# Patient Record
Sex: Female | Born: 1970 | Race: Black or African American | Hispanic: No | Smoking: Never smoker
Health system: Southern US, Community
[De-identification: ages and names within clinical notes are randomized; demographics above are authoritative.]

## PROBLEM LIST (undated history)

## (undated) DIAGNOSIS — M1990 Unspecified osteoarthritis, unspecified site: Secondary | ICD-10-CM

## (undated) DIAGNOSIS — M797 Fibromyalgia: Secondary | ICD-10-CM

## (undated) DIAGNOSIS — R059 Cough, unspecified: Secondary | ICD-10-CM

## (undated) DIAGNOSIS — R05 Cough: Secondary | ICD-10-CM

## (undated) DIAGNOSIS — E785 Hyperlipidemia, unspecified: Secondary | ICD-10-CM

## (undated) DIAGNOSIS — N3941 Urge incontinence: Secondary | ICD-10-CM

## (undated) DIAGNOSIS — F320 Major depressive disorder, single episode, mild: Secondary | ICD-10-CM

## (undated) DIAGNOSIS — R0600 Dyspnea, unspecified: Secondary | ICD-10-CM

## (undated) DIAGNOSIS — F32A Depression, unspecified: Secondary | ICD-10-CM

## (undated) DIAGNOSIS — J310 Chronic rhinitis: Secondary | ICD-10-CM

## (undated) DIAGNOSIS — E1165 Type 2 diabetes mellitus with hyperglycemia: Secondary | ICD-10-CM

## (undated) DIAGNOSIS — R51 Headache: Secondary | ICD-10-CM

## (undated) DIAGNOSIS — IMO0001 Reserved for inherently not codable concepts without codable children: Secondary | ICD-10-CM

## (undated) DIAGNOSIS — J849 Interstitial pulmonary disease, unspecified: Secondary | ICD-10-CM

## (undated) DIAGNOSIS — K219 Gastro-esophageal reflux disease without esophagitis: Secondary | ICD-10-CM

## (undated) DIAGNOSIS — J40 Bronchitis, not specified as acute or chronic: Secondary | ICD-10-CM

## (undated) DIAGNOSIS — I10 Essential (primary) hypertension: Secondary | ICD-10-CM

## (undated) HISTORY — DX: Dyspnea, unspecified: R06.00

## (undated) HISTORY — DX: Fibromyalgia: M79.7

## (undated) HISTORY — DX: Cough: R05

## (undated) HISTORY — DX: Hyperlipidemia, unspecified: E78.5

## (undated) HISTORY — DX: Depression, unspecified: F32.A

## (undated) HISTORY — DX: Type 2 diabetes mellitus with hyperglycemia: E11.65

## (undated) HISTORY — DX: Chronic rhinitis: J31.0

## (undated) HISTORY — DX: Bronchitis, not specified as acute or chronic: J40

## (undated) HISTORY — DX: Major depressive disorder, single episode, mild: F32.0

## (undated) HISTORY — DX: Interstitial pulmonary disease, unspecified: J84.9

## (undated) HISTORY — DX: Essential (primary) hypertension: I10

## (undated) HISTORY — DX: Morbid (severe) obesity due to excess calories: E66.01

## (undated) HISTORY — DX: Gastro-esophageal reflux disease without esophagitis: K21.9

## (undated) HISTORY — DX: Headache: R51

## (undated) HISTORY — DX: Cough, unspecified: R05.9

## (undated) HISTORY — PX: ABDOMINAL HYSTERECTOMY: SHX81

## (undated) HISTORY — PX: LUNG BIOPSY: SHX232

## (undated) HISTORY — DX: Urge incontinence: N39.41

## (undated) HISTORY — DX: Reserved for inherently not codable concepts without codable children: IMO0001

---

## 1998-01-06 ENCOUNTER — Encounter: Admission: RE | Admit: 1998-01-06 | Discharge: 1998-01-06 | Payer: Self-pay | Admitting: Family Medicine

## 1998-02-16 ENCOUNTER — Encounter: Admission: RE | Admit: 1998-02-16 | Discharge: 1998-02-16 | Payer: Self-pay | Admitting: Family Medicine

## 1998-03-07 ENCOUNTER — Encounter: Admission: RE | Admit: 1998-03-07 | Discharge: 1998-03-07 | Payer: Self-pay | Admitting: Family Medicine

## 1998-03-16 ENCOUNTER — Encounter: Admission: RE | Admit: 1998-03-16 | Discharge: 1998-03-16 | Payer: Self-pay | Admitting: Family Medicine

## 1998-12-02 ENCOUNTER — Other Ambulatory Visit: Admission: RE | Admit: 1998-12-02 | Discharge: 1998-12-02 | Payer: Self-pay | Admitting: *Deleted

## 1998-12-15 ENCOUNTER — Encounter: Payer: Self-pay | Admitting: *Deleted

## 1998-12-15 ENCOUNTER — Ambulatory Visit (HOSPITAL_COMMUNITY): Admission: RE | Admit: 1998-12-15 | Discharge: 1998-12-15 | Payer: Self-pay | Admitting: *Deleted

## 1999-02-06 ENCOUNTER — Inpatient Hospital Stay (HOSPITAL_COMMUNITY): Admission: RE | Admit: 1999-02-06 | Discharge: 1999-02-09 | Payer: Self-pay | Admitting: *Deleted

## 2000-08-01 ENCOUNTER — Emergency Department (HOSPITAL_COMMUNITY): Admission: EM | Admit: 2000-08-01 | Discharge: 2000-08-01 | Payer: Self-pay | Admitting: *Deleted

## 2000-12-10 ENCOUNTER — Emergency Department (HOSPITAL_COMMUNITY): Admission: EM | Admit: 2000-12-10 | Discharge: 2000-12-10 | Payer: Self-pay | Admitting: Emergency Medicine

## 2001-05-13 ENCOUNTER — Encounter: Payer: Self-pay | Admitting: Emergency Medicine

## 2001-05-13 ENCOUNTER — Emergency Department (HOSPITAL_COMMUNITY): Admission: EM | Admit: 2001-05-13 | Discharge: 2001-05-13 | Payer: Self-pay | Admitting: Emergency Medicine

## 2004-01-02 ENCOUNTER — Emergency Department (HOSPITAL_COMMUNITY): Admission: EM | Admit: 2004-01-02 | Discharge: 2004-01-02 | Payer: Self-pay | Admitting: Emergency Medicine

## 2004-01-10 ENCOUNTER — Emergency Department (HOSPITAL_COMMUNITY): Admission: EM | Admit: 2004-01-10 | Discharge: 2004-01-10 | Payer: Self-pay | Admitting: Emergency Medicine

## 2004-01-11 ENCOUNTER — Ambulatory Visit (HOSPITAL_COMMUNITY): Admission: RE | Admit: 2004-01-11 | Discharge: 2004-01-11 | Payer: Self-pay | Admitting: Internal Medicine

## 2004-01-17 ENCOUNTER — Ambulatory Visit: Admission: RE | Admit: 2004-01-17 | Discharge: 2004-01-17 | Payer: Self-pay | Admitting: Internal Medicine

## 2004-01-17 ENCOUNTER — Encounter (INDEPENDENT_AMBULATORY_CARE_PROVIDER_SITE_OTHER): Payer: Self-pay | Admitting: Specialist

## 2004-02-11 ENCOUNTER — Inpatient Hospital Stay (HOSPITAL_COMMUNITY): Admission: EM | Admit: 2004-02-11 | Discharge: 2004-02-20 | Payer: Self-pay | Admitting: Emergency Medicine

## 2004-02-15 ENCOUNTER — Encounter (INDEPENDENT_AMBULATORY_CARE_PROVIDER_SITE_OTHER): Payer: Self-pay | Admitting: *Deleted

## 2004-02-15 HISTORY — PX: BILATERAL VATS ABLATION: SHX1224

## 2004-02-17 ENCOUNTER — Encounter: Payer: Self-pay | Admitting: Internal Medicine

## 2004-03-02 ENCOUNTER — Encounter: Admission: RE | Admit: 2004-03-02 | Discharge: 2004-03-02 | Payer: Self-pay | Admitting: Thoracic Surgery

## 2004-03-07 ENCOUNTER — Encounter: Admission: RE | Admit: 2004-03-07 | Discharge: 2004-06-05 | Payer: Self-pay | Admitting: Internal Medicine

## 2004-03-13 ENCOUNTER — Emergency Department (HOSPITAL_COMMUNITY): Admission: EM | Admit: 2004-03-13 | Discharge: 2004-03-13 | Payer: Self-pay | Admitting: Emergency Medicine

## 2004-04-13 ENCOUNTER — Encounter: Admission: RE | Admit: 2004-04-13 | Discharge: 2004-04-13 | Payer: Self-pay | Admitting: Thoracic Surgery

## 2004-10-20 ENCOUNTER — Ambulatory Visit: Payer: Self-pay | Admitting: Internal Medicine

## 2004-11-15 ENCOUNTER — Ambulatory Visit: Payer: Self-pay | Admitting: Internal Medicine

## 2006-07-31 ENCOUNTER — Ambulatory Visit: Payer: Self-pay | Admitting: Internal Medicine

## 2007-01-30 ENCOUNTER — Other Ambulatory Visit: Admission: RE | Admit: 2007-01-30 | Discharge: 2007-01-30 | Payer: Self-pay | Admitting: Family Medicine

## 2007-06-19 ENCOUNTER — Ambulatory Visit: Payer: Self-pay | Admitting: Internal Medicine

## 2007-06-19 LAB — CONVERTED CEMR LAB
CO2: 28 meq/L (ref 19–32)
Chloride: 107 meq/L (ref 96–112)
Eosinophils Absolute: 0.1 10*3/uL (ref 0.0–0.6)
Eosinophils Relative: 0.7 % (ref 0.0–5.0)
GFR calc non Af Amer: 149 mL/min
Glucose, Bld: 85 mg/dL (ref 70–99)
HCT: 36.6 % (ref 36.0–46.0)
Lymphocytes Relative: 44.7 % (ref 12.0–46.0)
MCV: 89.2 fL (ref 78.0–100.0)
Neutro Abs: 3.4 10*3/uL (ref 1.4–7.7)
Neutrophils Relative %: 44.5 % (ref 43.0–77.0)
Pro B Natriuretic peptide (BNP): 14 pg/mL (ref 0.0–100.0)
RBC: 4.11 M/uL (ref 3.87–5.11)
TSH: 1.9 microintl units/mL (ref 0.35–5.50)
WBC: 7.7 10*3/uL (ref 4.5–10.5)

## 2007-06-26 ENCOUNTER — Ambulatory Visit: Payer: Self-pay | Admitting: Internal Medicine

## 2007-08-02 ENCOUNTER — Emergency Department (HOSPITAL_COMMUNITY): Admission: EM | Admit: 2007-08-02 | Discharge: 2007-08-02 | Payer: Self-pay | Admitting: Emergency Medicine

## 2007-10-10 ENCOUNTER — Ambulatory Visit: Payer: Self-pay | Admitting: Internal Medicine

## 2007-10-10 DIAGNOSIS — R059 Cough, unspecified: Secondary | ICD-10-CM | POA: Insufficient documentation

## 2007-10-10 DIAGNOSIS — K219 Gastro-esophageal reflux disease without esophagitis: Secondary | ICD-10-CM

## 2007-10-10 DIAGNOSIS — J069 Acute upper respiratory infection, unspecified: Secondary | ICD-10-CM | POA: Insufficient documentation

## 2007-10-10 DIAGNOSIS — R05 Cough: Secondary | ICD-10-CM

## 2007-10-10 DIAGNOSIS — J8410 Pulmonary fibrosis, unspecified: Secondary | ICD-10-CM

## 2007-10-22 ENCOUNTER — Telehealth (INDEPENDENT_AMBULATORY_CARE_PROVIDER_SITE_OTHER): Payer: Self-pay | Admitting: *Deleted

## 2007-10-23 ENCOUNTER — Ambulatory Visit: Payer: Self-pay | Admitting: Cardiology

## 2007-10-24 ENCOUNTER — Ambulatory Visit: Payer: Self-pay | Admitting: Internal Medicine

## 2007-12-30 ENCOUNTER — Ambulatory Visit: Payer: Self-pay | Admitting: Internal Medicine

## 2007-12-30 DIAGNOSIS — R0602 Shortness of breath: Secondary | ICD-10-CM

## 2008-02-24 ENCOUNTER — Telehealth (INDEPENDENT_AMBULATORY_CARE_PROVIDER_SITE_OTHER): Payer: Self-pay | Admitting: *Deleted

## 2008-02-24 ENCOUNTER — Ambulatory Visit: Payer: Self-pay | Admitting: Internal Medicine

## 2008-03-03 ENCOUNTER — Ambulatory Visit: Payer: Self-pay | Admitting: Internal Medicine

## 2008-03-17 ENCOUNTER — Ambulatory Visit: Payer: Self-pay | Admitting: Internal Medicine

## 2008-03-24 ENCOUNTER — Telehealth (INDEPENDENT_AMBULATORY_CARE_PROVIDER_SITE_OTHER): Payer: Self-pay | Admitting: *Deleted

## 2008-04-16 ENCOUNTER — Ambulatory Visit: Payer: Self-pay | Admitting: Internal Medicine

## 2008-05-28 ENCOUNTER — Ambulatory Visit: Payer: Self-pay | Admitting: Internal Medicine

## 2008-07-09 ENCOUNTER — Ambulatory Visit: Payer: Self-pay | Admitting: Internal Medicine

## 2008-09-10 ENCOUNTER — Telehealth: Payer: Self-pay | Admitting: Internal Medicine

## 2008-11-16 ENCOUNTER — Ambulatory Visit: Payer: Self-pay | Admitting: Internal Medicine

## 2008-12-23 ENCOUNTER — Ambulatory Visit: Payer: Self-pay | Admitting: Internal Medicine

## 2008-12-23 DIAGNOSIS — J310 Chronic rhinitis: Secondary | ICD-10-CM

## 2009-01-03 ENCOUNTER — Telehealth (INDEPENDENT_AMBULATORY_CARE_PROVIDER_SITE_OTHER): Payer: Self-pay | Admitting: *Deleted

## 2009-01-04 ENCOUNTER — Encounter (INDEPENDENT_AMBULATORY_CARE_PROVIDER_SITE_OTHER): Payer: Self-pay | Admitting: *Deleted

## 2009-02-17 ENCOUNTER — Ambulatory Visit: Payer: Self-pay | Admitting: Internal Medicine

## 2009-03-03 ENCOUNTER — Encounter: Payer: Self-pay | Admitting: Internal Medicine

## 2009-03-03 ENCOUNTER — Telehealth (INDEPENDENT_AMBULATORY_CARE_PROVIDER_SITE_OTHER): Payer: Self-pay | Admitting: *Deleted

## 2009-03-24 ENCOUNTER — Ambulatory Visit: Payer: Self-pay | Admitting: Internal Medicine

## 2009-03-24 DIAGNOSIS — N3941 Urge incontinence: Secondary | ICD-10-CM | POA: Insufficient documentation

## 2009-03-24 DIAGNOSIS — R51 Headache: Secondary | ICD-10-CM

## 2009-03-24 DIAGNOSIS — F329 Major depressive disorder, single episode, unspecified: Secondary | ICD-10-CM

## 2009-03-24 DIAGNOSIS — R519 Headache, unspecified: Secondary | ICD-10-CM | POA: Insufficient documentation

## 2009-03-24 LAB — CONVERTED CEMR LAB: Pap Smear: NORMAL

## 2009-04-05 ENCOUNTER — Telehealth (INDEPENDENT_AMBULATORY_CARE_PROVIDER_SITE_OTHER): Payer: Self-pay | Admitting: *Deleted

## 2009-04-20 ENCOUNTER — Ambulatory Visit: Payer: Self-pay | Admitting: Internal Medicine

## 2009-05-09 ENCOUNTER — Ambulatory Visit: Payer: Self-pay | Admitting: Internal Medicine

## 2009-05-13 ENCOUNTER — Ambulatory Visit: Payer: Self-pay | Admitting: Internal Medicine

## 2009-05-26 ENCOUNTER — Encounter: Admission: RE | Admit: 2009-05-26 | Discharge: 2009-05-26 | Payer: Self-pay | Admitting: Internal Medicine

## 2009-06-10 ENCOUNTER — Ambulatory Visit: Payer: Self-pay | Admitting: Internal Medicine

## 2009-06-20 ENCOUNTER — Encounter (HOSPITAL_COMMUNITY): Admission: RE | Admit: 2009-06-20 | Discharge: 2009-09-18 | Payer: Self-pay | Admitting: Internal Medicine

## 2009-06-27 ENCOUNTER — Telehealth: Payer: Self-pay | Admitting: Internal Medicine

## 2009-07-22 ENCOUNTER — Ambulatory Visit: Payer: Self-pay | Admitting: Internal Medicine

## 2009-08-10 ENCOUNTER — Ambulatory Visit: Payer: Self-pay | Admitting: Internal Medicine

## 2009-08-19 ENCOUNTER — Ambulatory Visit: Payer: Self-pay | Admitting: Pulmonary Disease

## 2009-08-19 ENCOUNTER — Telehealth (INDEPENDENT_AMBULATORY_CARE_PROVIDER_SITE_OTHER): Payer: Self-pay | Admitting: *Deleted

## 2009-08-22 ENCOUNTER — Ambulatory Visit: Payer: Self-pay | Admitting: Internal Medicine

## 2009-08-22 ENCOUNTER — Telehealth (INDEPENDENT_AMBULATORY_CARE_PROVIDER_SITE_OTHER): Payer: Self-pay | Admitting: *Deleted

## 2009-08-29 ENCOUNTER — Ambulatory Visit: Payer: Self-pay | Admitting: Internal Medicine

## 2009-09-07 ENCOUNTER — Ambulatory Visit: Payer: Self-pay | Admitting: Internal Medicine

## 2009-09-07 DIAGNOSIS — M62838 Other muscle spasm: Secondary | ICD-10-CM | POA: Insufficient documentation

## 2009-09-07 DIAGNOSIS — I10 Essential (primary) hypertension: Secondary | ICD-10-CM | POA: Insufficient documentation

## 2009-09-07 DIAGNOSIS — I152 Hypertension secondary to endocrine disorders: Secondary | ICD-10-CM | POA: Insufficient documentation

## 2009-09-07 LAB — CONVERTED CEMR LAB
Basophils Absolute: 0 10*3/uL (ref 0.0–0.1)
Eosinophils Absolute: 0 10*3/uL (ref 0.0–0.7)
GFR calc non Af Amer: 103.55 mL/min (ref >60)
Glucose, Bld: 110 mg/dL — ABNORMAL HIGH (ref 70–99)
HCT: 37.4 % (ref 36.0–46.0)
Hemoglobin: 12.4 g/dL (ref 12.0–15.0)
Lymphocytes Relative: 33.6 % (ref 12.0–46.0)
Lymphs Abs: 2.4 10*3/uL (ref 0.7–4.0)
Monocytes Absolute: 0.6 10*3/uL (ref 0.1–1.0)
Monocytes Relative: 8.3 % (ref 3.0–12.0)
Potassium: 4.4 meq/L (ref 3.5–5.1)
Sodium: 144 meq/L (ref 135–145)
TSH: 2.26 microintl units/mL (ref 0.35–5.50)
WBC: 7 10*3/uL (ref 4.5–10.5)

## 2009-09-19 ENCOUNTER — Encounter (HOSPITAL_COMMUNITY): Admission: RE | Admit: 2009-09-19 | Discharge: 2009-09-30 | Payer: Self-pay | Admitting: Internal Medicine

## 2009-09-21 ENCOUNTER — Ambulatory Visit: Payer: Self-pay | Admitting: Internal Medicine

## 2009-09-27 ENCOUNTER — Ambulatory Visit: Payer: Self-pay | Admitting: Internal Medicine

## 2009-10-01 ENCOUNTER — Encounter (HOSPITAL_COMMUNITY): Admission: RE | Admit: 2009-10-01 | Discharge: 2009-10-31 | Payer: Self-pay | Admitting: Internal Medicine

## 2009-11-08 ENCOUNTER — Ambulatory Visit: Payer: Self-pay | Admitting: Internal Medicine

## 2009-12-20 ENCOUNTER — Ambulatory Visit: Payer: Self-pay | Admitting: Internal Medicine

## 2010-01-05 ENCOUNTER — Ambulatory Visit: Payer: Self-pay | Admitting: Internal Medicine

## 2010-01-05 DIAGNOSIS — H1045 Other chronic allergic conjunctivitis: Secondary | ICD-10-CM

## 2010-01-05 DIAGNOSIS — J309 Allergic rhinitis, unspecified: Secondary | ICD-10-CM

## 2010-06-08 ENCOUNTER — Encounter: Payer: Self-pay | Admitting: Internal Medicine

## 2010-06-22 ENCOUNTER — Ambulatory Visit: Payer: Self-pay | Admitting: Internal Medicine

## 2010-06-28 ENCOUNTER — Telehealth: Payer: Self-pay | Admitting: Internal Medicine

## 2010-08-11 ENCOUNTER — Ambulatory Visit: Payer: Self-pay | Admitting: Internal Medicine

## 2010-10-31 ENCOUNTER — Other Ambulatory Visit: Payer: Self-pay | Admitting: Internal Medicine

## 2010-10-31 ENCOUNTER — Ambulatory Visit
Admission: RE | Admit: 2010-10-31 | Discharge: 2010-10-31 | Payer: Self-pay | Source: Home / Self Care | Attending: Internal Medicine | Admitting: Internal Medicine

## 2010-10-31 LAB — HEPATIC FUNCTION PANEL
ALT: 23 U/L (ref 0–35)
AST: 18 U/L (ref 0–37)
Bilirubin, Direct: 0.1 mg/dL (ref 0.0–0.3)
Total Bilirubin: 0.5 mg/dL (ref 0.3–1.2)

## 2010-10-31 LAB — CBC WITH DIFFERENTIAL/PLATELET
Basophils Absolute: 0 10*3/uL (ref 0.0–0.1)
Eosinophils Relative: 0.2 % (ref 0.0–5.0)
HCT: 38.6 % (ref 36.0–46.0)
Hemoglobin: 12.9 g/dL (ref 12.0–15.0)
Lymphs Abs: 2.1 10*3/uL (ref 0.7–4.0)
Monocytes Absolute: 0.5 10*3/uL (ref 0.1–1.0)
Monocytes Relative: 7.9 % (ref 3.0–12.0)
Neutrophils Relative %: 59.2 % (ref 43.0–77.0)
Platelets: 218 10*3/uL (ref 150.0–400.0)
RDW: 14.9 % — ABNORMAL HIGH (ref 11.5–14.6)
WBC: 6.4 10*3/uL (ref 4.5–10.5)

## 2010-10-31 LAB — URINALYSIS
Hemoglobin, Urine: NEGATIVE
Leukocytes, UA: NEGATIVE
Nitrite: NEGATIVE
Urobilinogen, UA: 0.2 (ref 0.0–1.0)

## 2010-10-31 LAB — LIPID PANEL
LDL Cholesterol: 135 mg/dL — ABNORMAL HIGH (ref 0–99)
Total CHOL/HDL Ratio: 4
Triglycerides: 40 mg/dL (ref 0.0–149.0)

## 2010-10-31 LAB — BASIC METABOLIC PANEL
CO2: 29 mEq/L (ref 19–32)
GFR: 173.02 mL/min (ref >60.00)
Glucose, Bld: 91 mg/dL (ref 70–99)
Potassium: 4.7 mEq/L (ref 3.5–5.1)
Sodium: 140 mEq/L (ref 135–145)

## 2010-10-31 NOTE — Assessment & Plan Note (Signed)
Summary: Pulmonary/ ext ov with walking sats = dropped p 185 x 3   Primary Provider/Referring Provider:  Newt Lukes MD  CC:  Followup.  Pt states that she has been doing well. He has not had any trouble with cough or with SOB.  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  Since onset waxing/waning sx requiring intermittent prednisone tapers with only minimal improvement - most of her symptoms chronically have been related to obesity with dyspnea on exertion and tendency to chronic cough which is felt to be at least partly  reflux related.     10/9/9  ov  maintained on 10 mg/day without flare of cough or sob.   Feb 17, 2009 better until 2 weeks ago when tapered prednisone to   10-5 -10 >  more sob, eyes running and swollen sneezing not responding flonase or clariton  rec 10 mg 2 until better then 2-1-2 and flared with cough on that dose so resumed 2 daily but still cough with deep breath. rec restart 20/day  June 10, 2009 on 20 mg no cough, worried about the ragweed season minimal symptoms. using med calendar well. rec  try 20 a/w 10  07/22/09 doing fine so try floor 10 mg per day, ceiling of 20mg  per day.  August 22, 2009 Followup per Dr Shelle Iron.  Pt was seen here by [facility] on 11/19  for cough rx prednisone burst and add back reglan but just at hs..  Pt states that her cough is worse.  Cough is prod with yellow sputum.  Pt states that her voice "comes and goes".  Breathing is better when she is not coughing. She is taking 40 mg of prednisone daily.  --Tx w/ Levaquin, pred burst, reglan and pepcid added.   September 27, 2009 ov all better on prednisone 10mg  and back to baseline ex tolerance, cough control.   November 08, 2009 ov loosing wt voluntarily, no cough, baseline doe on 02 - rec taper reglan, keep up with med calendar  see page 2 June 22, 2010  Followup.  Pt states that she has been doing well. He has not had any trouble with cough or with SOB.  She does c/o some sinus pressure/drainage x 3 days. not using med calendar or amlodipine or advair. Not using 02 with ex, not able to loose wt. Pt denies any significant sore throat, dysphagia, itching, sneezing,  nasal congestion or excess secretions,  fever, chills, sweats, unintended wt loss, pleuritic or exertional cp, hempoptysis, change in activity tolerance  orthopnea pnd or leg swelling   Current Medications (verified): 1)  Reglan 10 Mg  Tabs (Metoclopramide Hcl) .Marland Kitchen.. 1 Tab By Mouth Before Every Meal and At Bedtime 2)  Nexium 40 Mg  Pack (Esomeprazole Magnesium) .... Take One 30-60 Min Before First and Last Meals of The Day 3)  Fluticasone Propionate 50 Mcg/act Susp (Fluticasone Propionate) .... 2 Sprays in Each Nostril Twice Daily  Have Not Used This- Only Uses As Needed 4)  Detrol La 2 Mg Xr24h-Cap (Tolterodine Tartrate) .Marland Kitchen.. 1 By Mouth Once Daily 5)  B-12 1000 Mcg Subl (Cyanocobalamin) .Marland Kitchen.. 1 Once Daily 6)  Oxygen .... 2 Liters Per Minutes At Bedtime 7)  Pepcid Ac Maximum Strength 20 Mg Tabs (Famotidine) .... One At Bedtime 8)  Mucinex Dm 30-600 Mg Xr12h-Tab (Dextromethorphan-Guaifenesin) .... Take  1-2 Tablets Every 12 Hours As Needed 9)  Acetaminophen-Codeine #4 300-60 Mg Tabs (Acetaminophen-Codeine) .Marland Kitchen.. 1-2 Every 4-6 Hours As Needed 10)  Prednisone 10 Mg Tabs (Prednisone) .... Take 1 By Mouth Qd 11)  Proair Hfa 108 (90 Base) Mcg/act Aers (Albuterol Sulfate) .... Inhale 2 Puffs Every 4-6 Hours As Needed 12)  Cetirizine Hcl 10 Mg Tabs (Cetirizine Hcl) .Marland Kitchen.. 1po Once Daily As Needed Allergies 13)  Oxygen .... Wear 2l/m With Activity 14)  Amlodipine Besylate 5 Mg Tabs (Amlodipine Besylate) .Marland Kitchen.. 1 By Mouth Once Daily 15)  Advair Diskus 250-50 Mcg/dose Aepb (Fluticasone-Salmeterol) .Marland Kitchen.. 1 Puff Two Times A Day  Not Taking This Now  Allergies (verified): 1)  ! Penicillin  Past  History:  Past Medical History: GASTROESOPHAGEAL REFLUX DISEASE (ICD-530.81)     - Taper reglan to 10 mg one half twice daily June 22, 2010  INTERSTITIAL LUNG DISEASE (ICD-515)    -VATS 02/15/2004 NSIP Adrienne Mocha)   -Restart Prednisone 02/24/08   -PFT's December 23, 2008 VC 35%  DLC0 31% MORBID OBESITY (ICD-278.01)   - Target wt  =  153  for BMI < 30  Health Maintenance..........................................................Marland KitchenMarland KitchenFelicity Coyer    - Pneumovax July 22, 2009     -Complex med review August 29, 2009    Vital Signs:  Patient profile:   40 year old female Weight:      299 pounds O2 Sat:      100 % on Room air Temp:     98.1 degrees F oral Pulse rate:   107 / minute BP sitting:   120 / 80  (left arm) Cuff size:   large  Vitals Entered By: Vernie Murders (June 22, 2010 4:14 PM)  O2 Flow:  Room air  Serial Vital Signs/Assessments:  Comments: Ambulatory Pulse Oximetry  Resting; HR__103___    02 Sat__98%RA___  Lap1 (185 feet)   HR__136___   02 Sat__94%RA___ Lap2 (185 feet)   HR__134___   02 Sat___82%RA__    Lap3 (185 feet)   HR_____   02 Sat_____  ___Test Completed without Difficulty _x__Test Stopped due to: pt desat to 82%RA. Zackery Barefoot CMA  June 22, 2010 4:44 PM  By: Zackery Barefoot CMA    Physical Exam  Additional Exam:  Massively obese pleasant amb bf nad  wt  305 May 28, 2008 > 305 July 09, 2008 > 305 November 16, 2008    > 301 November 08, 2009  > 299 June 22, 2010  Afeb with normal vital signs HEENT: nl dentition, turbinates, and orophanx. Nl external ear canals without cough reflex Neck without JVD/Nodes/TM Lungs distant bs, minimal basilar insp crackles bilaterally not worse with deep breath, not coughing now with deep breath RRR no s3 or murmur or increase in P2. no edema Abd soft and benign with nl excursion in the supine position. Ext warm without calf tenderness, cyanosis clubbing     Impression &  Recommendations:  Problem # 1:  INTERSTITIAL LUNG DISEASE (ICD-515)  The goal with a chronic steroid dependent illness is always arriving at the lowest effective dose that controls the disease/symptoms and not accepting a set "formula" which is based on statistics that don't take into accound individual variability or the natural hx of the dz in every individual patient, which may well vary over time. for now rec floor is 10 mg per day  alternate with 5 mg if tolerates       Problem # 2:  GASTROESOPHAGEAL REFLUX DISEASE (ICD-530.81)  Her updated  medication list for this problem includes:    Nexium 40 Mg Pack (Esomeprazole magnesium) .Marland Kitchen... Take one 30-60 min before first and last meals of the day    Pepcid Ac Maximum Strength 20 Mg Tabs (Famotidine) ..... One at bedtime   Will try to taper reglan now  Orders: Est. Patient Level IV (11914)  Problem # 3:  MORBID OBESITY (ICD-278.01)  wt loss would go better if used 02 with ex as rec previously  Orders: Est. Patient Level IV (78295)  Medications Added to Medication List This Visit: 1)  Prednisone 10 Mg Tabs (Prednisone) .... One on even and half on odd 2)  Reglan 10 Mg Tabs (Metoclopramide hcl) .... One half before bfast and bedtime 3)  Fluticasone Propionate 50 Mcg/act Susp (Fluticasone propionate) .... 2 sprays in each nostril twice daily  have not used this- only uses as needed 4)  Advair Diskus 250-50 Mcg/dose Aepb (Fluticasone-salmeterol) .Marland Kitchen.. 1 puff two times a day  not taking this now  Other Orders: Pulse Oximetry, Ambulatory (62130)  Patient Instructions: 1)  ok to leave off advair, amlodipine 2)  Try reduce reglan to 10 mg one half before breakfast and bedtime 3)  Reduce Prednisone to 10 mg one on even half on odd 4)  See Tammy NP w/in 6 weeks with all your medications, even over the counter meds, separated in two separate bags, the ones you take no matter what vs the ones you stop once you feel better and take only as  needed.  She will generate for you a new user friendly medication calendar that will put [place] all on the same page re: your medication use.  5)  Late add wt loss would go better if used 02 with ex as rec previously  Appended Document: Pulmonary/ ext ov with walking sats = dropped p 185 x 3 see late add re 02 instructions  Appended Document: Pulmonary/ ext ov with walking sats = dropped p 185 x 3 [id number]  Appended Document: Pulmonary/ ext ov with walking sats = dropped p 185 x 3 [id number]

## 2010-10-31 NOTE — Assessment & Plan Note (Signed)
Summary: Pulmonary/ f/u NSIP   Primary Provider/Referring Provider:  Newt Lukes MD  CC: 39 wk followup.  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  Since onset waxing/waning sx requiring intermittent prednisone tapers with only minimal improvement - most of her symptoms chronically have been related to obesity with dyspnea on exertion and tendency to chronic cough which is felt to be at least partly  reflux related.     39/9/9  ov  maintained on 10 mg/day without flare of cough or sob.   Feb 17, 2009 better until 2 weeks ago when tapered prednisone to   10-5 -10 >  more sob, eyes running and swollen sneezing not responding flonase or clariton  rec 10 mg 2 until better then 2-1-2 and flared with cough on that dose so resumed 2 daily but still cough with deep breath. rec restart 20/day  June 10, 2009 on 20 mg no cough, worried about the ragweed season minimal symptoms. using med calendar well. rec  try 20 a/w 10  07/22/09 doing fine so try floor 10 mg per day, ceiling of 20mg  per day.  August 22, 2009 Followup per Dr Shelle Iron.  Pt was seen here by [facility] on 11/19  for cough rx prednisone burst and add back reglan but just at hs..  Pt states that her cough is worse.  Cough is prod with yellow sputum.  Pt states that her voice "comes and goes".  Breathing is better when she is not coughing. She is taking 40 mg of prednisone daily.  --Tx w/ Levaquin, pred burst, reglan and pepcid added.   September 27, 2009 ov all better on prednisone 10mg  and back to baseline ex tolerance, cough control.   November 08, 2009 ov loosing wt voluntarily, no cough, baseline doe on 02  Pt denies any significant sore throat, dysphagia, itching, sneezing,  nasal congestion or excess secretions,  fever, chills, sweats, unintended wt loss, pleuritic or exertional cp,  hempoptysis, change in activity tolerance  orthopnea pnd or leg swelling   Current Medications (verified): 1)  Reglan 10 Mg  Tabs (Metoclopramide Hcl) .Marland Kitchen.. 1 Tab By Mouth Before Every Meal and At Bedtime 2)  Nexium 40 Mg  Pack (Esomeprazole Magnesium) .... Take One 30-60 Min Before First and Last Meals of The Day 3)  Fluticasone Propionate 50 Mcg/act Susp (Fluticasone Propionate) .... 2 Sprays in Each Nostril Twice Daily 4)  Detrol La 2 Mg Xr24h-Cap (Tolterodine Tartrate) .Marland Kitchen.. 1 By Mouth Once Daily 5)  B-12 1000 Mcg Subl (Cyanocobalamin) .Marland Kitchen.. 1 Once Daily 6)  Oxygen .... 2 Liters Per Minutes At Bedtime 7)  Pepcid Ac Maximum Strength 20 Mg Tabs (Famotidine) .... One At Bedtime 8)  Mucinex Dm 30-600 Mg Xr12h-Tab (Dextromethorphan-Guaifenesin) .... Take 1-2 Tablets Every 12 Hours As Needed 9)  Acetaminophen-Codeine #4 300-60 Mg Tabs (Acetaminophen-Codeine) .Marland Kitchen.. 1-2 Every 4-6 Hours As Needed 10)  Prednisone 10 Mg Tabs (Prednisone) .... Take 1 By Mouth Qd 11)  Proair Hfa 108 (90 Base) Mcg/act Aers (Albuterol Sulfate) .... Inhale 2 Puffs Every 4-6 Hours As Needed 12)  Loratadine 10 Mg Tabs (Loratadine) .... Take 1 Tablet By Mouth Once A Day As Needed 13)  Oxygen .... Wear 2l/m With Activity 14)  Amlodipine Besylate 5 Mg Tabs (Amlodipine Besylate) .Marland Kitchen.. 1 By Mouth Once Daily  Allergies (verified): 1)  ! Penicillin  Past History:  Past Medical History: GASTROESOPHAGEAL REFLUX DISEASE (ICD-530.81) INTERSTITIAL LUNG DISEASE (ICD-515)    -VATS 02/15/2004 NSIP ([place]zenstein)   -Restart Prednisone 02/24/08   -PFT's December 23, 2008 VC 35%  DLC0 31% MORBID OBESITY (ICD-278.01)   - Target wt  =  153  for BMI < 30  Health Maintenance......................................................Marland KitchenMarland KitchenFelicity Coyer    - Pneumovax July 22, 2009     -Complex med review August 29, 2009    Vital Signs:  Patient profile:   40 year old female Weight:      301 pounds O2 Sat:      99 % on 2 L/min Temp:     97.7 degrees  F oral Pulse rate:   86 / minute BP sitting:   122 / 70  (left arm) Cuff size:   large  Vitals Entered By: Vernie Murders (November 08, 2009 9:53 AM)  O2 Flow:  2 L/min  Serial Vital Signs/Assessments:  Comments: 10:54 AM Ambulatory Pulse Oximetry  Resting; HR__101___    02 Sat__99% 2lpm___  Lap1 (185 feet)   HR__142___   02 Sat__91%2lpm___ Lap2 (185 feet)   HR_____   02 Sat_____    Lap3 (185 feet)   HR_____   02 Sat_____  ___Test Completed without Difficulty ___Test Stopped due to:   By: Vernie Murders    Physical Exam  Additional Exam:  Massively obese pleasant amb bf nad  wt  305 May 28, 2008 > 305 July 09, 2008 > 305 November 16, 2008 >   300 May 17, 2009 > 304 July 22, 2009 > 299 August 22, 2009 >>308 August 29, 2009 > 309 September 27, 2009 > 301 November 08, 2009  Afeb with normal vital signs HEENT: nl dentition, turbinates, and orophanx. Nl external ear canals without cough reflex Neck without JVD/Nodes/TM Lungs distant bs, minimal basilar insp crackles bilaterally not worse with deep breath  RRR no s3 or murmur or increase in P2. no edema Abd soft and benign with nl excursion in the supine position. Ext warm without calf tenderness, cyanosis clubbing     Impression & Recommendations:  Problem # 1:  INTERSTITIAL LUNG DISEASE (ICD-515) The goal with a chronic steroid dependent illness is always arriving at the lowest effective dose that controls the disease/symptoms and not accepting a set "formula" which is based on statistics that don't take into accound individual variability or the natural hx of the dz in every individual patient, which may well vary over time. for now floor is 10 mg per day and next step is try to wean reglan down if not off which largely will depend on her ability to follow diet and continue to loose wt.   Each maintenance medication was reviewed in detail including most importantly the difference between maintenance and as  needed and under what circumstances the prns are to be used. This was done in the context of a medication calendar review which provided the patient with a user-friendly unambiguous mechanism for medication administration and reconciliation and provides an action plan for all active problems. It is critical that this be shown to every doctor  for modification during the office visit if necessary so the patient can use it as a working document.   Problem # 2:  DYSPNEA (ICD-786.05) 02 supplement appears adequate @ present level of activity  Problem # 3:  COUGH (ICD-786.2) Appears to have major component of Upper airway cough syndrome, so named because  it's frequently impossible to sort out how much is LPR vs CR/sinusitis with freq throat clearing generating secondary extra esophageal GERD from wide swings in gastric pressure that occur with throat clearing, promoting self use of mint and menthol lozenges that reduce the lower esophageal sphincter tone and exacerbate the problem further.  These symptoms are easily confused with asthma/copd by even experienced pulmonogists because they overlap so much. These are the same pts who not infrequently have failed to tolerate ace inhibitors,  dry powder inhalers or biphosphonates or report having reflux symptoms that don't respond to standard doses of PPI   much better so try to reduce GERD rx and see if it flares  Other Orders: Est. Patient Level III (81829) Pulse Oximetry, Ambulatory (93716)  Patient Instructions: 1)  Try reduce reglan to one half before meals and one at bedtime 2)  See calendar for specific medication instructions and bring it back for each and every office visit for every healthcare provider you see.  Without it,  you may not receive the best quality medical care that we feel you deserve.  3)  Please schedule a follow-up appointment in 6 weeks, sooner if needed

## 2010-10-31 NOTE — Letter (Signed)
Summary: [facility]  [address]   [place], [place] [address]   Phone: [phone]  Fax: [phone]    06/08/2010  [facility] [address] [place], [place]  [address]  Dear Ms. Laster,   According to our records you are due for a followup with Dr. Sherene Sires.  Please call our office at [phone] as soon as possible to schedule appointment. Thank You        Sincerely,   [facility]

## 2010-10-31 NOTE — Assessment & Plan Note (Signed)
Summary: NP follow up - med calendar   Primary Provider/Referring Provider:  Newt Lukes MD  CC:  est med calendar - pt brought all meds with her today.  c/o increased SOB, wheezing, dry cough x2weeks, would also like to discuss increased acid indegestion and states that food "has a hard time going down, and like it's stuck.".  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  Since onset waxing/waning sx requiring intermittent prednisone tapers with only minimal improvement - most of her symptoms chronically have been related to obesity with dyspnea on exertion and tendency to chronic cough which is felt to be at least partly  reflux related.     10/9/9  ov  maintained on 10 mg/day without flare of cough or sob.   Feb 17, 2009 better until 2 weeks ago when tapered prednisone to   10-5 -10 >  more sob, eyes running and swollen sneezing not responding flonase or clariton  rec 10 mg 2 until better then 2-1-2 and flared with cough on that dose so resumed 2 daily but still cough with deep breath. rec restart 20/day  June 10, 2009 on 20 mg no cough, worried about the ragweed season minimal symptoms. using med calendar well. rec  try 20 a/w 10  07/22/09 doing fine so try floor 10 mg per day, ceiling of 20mg  per day.  August 22, 2009 Followup per Dr Shelle Iron.  Pt was seen here by [facility] on 11/19  for cough rx prednisone burst and add back reglan but just at hs..  Pt states that her cough is worse.  Cough is prod with yellow sputum.  Pt states that her voice "comes and goes".  Breathing is better when she is not coughing. She is taking 40 mg of prednisone daily.  --Tx w/ Levaquin, pred burst, reglan and pepcid added.   September 27, 2009 ov all better on prednisone 10mg  and back to baseline ex tolerance, cough control.   November 08, 2009 ov loosing wt voluntarily, no cough, baseline doe on 02 - rec taper reglan, keep up with  med calendar  see page 2 June 22, 2010 Followup.  Pt states that she has been doing well. He has not had any trouble with cough or with SOB.  She does c/o some sinus pressure/drainage x 3 days. not using med calendar or amlodipine or advair. Not using 02 with ex, not able to loose wt.  >>decrease pred 10 and 5    August 11, 2010 --Presents for follow up and med review. We reviewed her meds and updated her med calendar. Feels over last week her breathing not quite as good. She  has increased her prednisone to 10mg  once daily. Was not able to decrease steroids to 1/2 every other day. no discolred mucus, fever. Denies chest pain, dyspnea, orthopnea, hemoptysis, fever, n/v/d, edema, headache. Requests refills today. Under some stress. Father recently passed. Strained relationship with mother.   Medications Prior to Update: 1)  Reglan 10 Mg  Tabs (Metoclopramide Hcl) .... One Half Before Bfast and Bedtime 2)  Nexium 40 Mg  Pack (Esomeprazole Magnesium) .... Take One 30-60 Min Before First and Last Meals of The Day 3)  Fluticasone Propionate 50 Mcg/act Susp (Fluticasone Propionate) .... 2 Sprays in Each Nostril Twice Daily  Have Not Used This- Only Uses As Needed 4)  Detrol La 2 Mg Xr24h-Cap (Tolterodine Tartrate) .Marland KitchenMarland KitchenMarland Kitchen  1 By Mouth Once Daily 5)  Prednisone 10 Mg Tabs (Prednisone) .... One On Even and Half On Odd 6)  Pepcid Ac Maximum Strength 20 Mg Tabs (Famotidine) .... One At Bedtime 7)  Oxygen .... 2 Liters Per Minutes At Bedtime 8)  Mucinex Dm 30-600 Mg Xr12h-Tab (Dextromethorphan-Guaifenesin) .... Take 1-2 Tablets Every 12 Hours As Needed 9)  Acetaminophen-Codeine #4 300-60 Mg Tabs (Acetaminophen-Codeine) .Marland Kitchen.. 1-2 Every 4-6 Hours As Needed 10)  Cetirizine Hcl 10 Mg Tabs (Cetirizine Hcl) .Marland Kitchen.. 1po Once Daily As Needed Allergies 11)  Oxygen .... Wear 2l/m With Activity 12)  Proair Hfa 108 (90 Base) Mcg/act Aers (Albuterol Sulfate) .... Inhale 2 Puffs Every 4-6 Hours As Needed 13)  B-12 1000 Mcg  Subl (Cyanocobalamin) .Marland Kitchen.. 1 Once Daily  Current Medications (verified): 1)  Reglan 10 Mg  Tabs (Metoclopramide Hcl) .... 1/2 Tab By Mouth  Every Morning and At Bedtime 2)  Nexium 40 Mg Cpdr (Esomeprazole Magnesium) .... Take 1 Capsule By Mouth Before The First and Last Meals of The Day 3)  Fluticasone Propionate 50 Mcg/act Susp (Fluticasone Propionate) .... 2 Sprays Each Nostril in The Morning and At Bedtime 4)  Detrol La 2 Mg Xr24h-Cap (Tolterodine Tartrate) .Marland Kitchen.. 1 By Mouth Once Daily 5)  Prednisone 10 Mg Tabs (Prednisone) .... Take 1 Tablet By Mouth Once A Day 6)  Pepcid Ac Maximum Strength 20 Mg Tabs (Famotidine) .... One At Bedtime 7)  Oxygen 2l/min .... Wear At Bedtime 8)  Mucinex Dm 30-600 Mg Xr12h-Tab (Dextromethorphan-Guaifenesin) .... Take 1 Tablet Every 12 Hours As Needed 9)  Acetaminophen-Codeine #4 300-60 Mg Tabs (Acetaminophen-Codeine) .Marland Kitchen.. 1-2 Every 4-6 Hours As Needed 10)  Loratadine 10 Mg Tabs (Loratadine) .... Take 1 Tablet By Mouth Once A Day As Needed 11)  Oxygen .... Wear 2l/m With Activity 12)  Proair Hfa 108 (90 Base) Mcg/act Aers (Albuterol Sulfate) .... Inhale 2 Puffs Every 4-6 Hours As Needed  Allergies (verified): 1)  ! Penicillin  Past History:  Past Medical History: Last updated: 06/22/2010 GASTROESOPHAGEAL REFLUX DISEASE (ICD-530.81)     - Taper reglan to 10 mg one half twice daily June 22, 2010  INTERSTITIAL LUNG DISEASE (ICD-515)    -VATS 02/15/2004 NSIP Adrienne Mocha)   -Restart Prednisone 02/24/08   -PFT's December 23, 2008 VC 35%  DLC0 31% MORBID OBESITY (ICD-278.01)   - Target wt  =  153  for BMI < 30  Health Maintenance..........................................................Marland KitchenMarland KitchenFelicity Coyer    - Pneumovax July 22, 2009     -Complex med review August 29, 2009    Family History: Last updated: 03/17/2008 positive sarcoid and her mother diagnosed in this clinic by transbronchial bx positive heart disease in her father and  diabetes family history of allergies in both parents both grandmothers had cancer of unknown type patient has 1 sibling with no significant medical history  Social History: Last updated: 04/20/2009 Patient never smoked.  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  Risk Factors: Smoking Status: never (10/10/2007)  Review of Systems      See HPI  Vital Signs:  Patient profile:   40 year old female Height:      61 inches Weight:      300.25 pounds BMI:     56.94 O2 Sat:      100 % on Room air Temp:     98.5 degrees F oral Pulse rate:  83 / minute BP sitting:   112 / 68  (left arm) Cuff size:   large  Vitals Entered By: Boone Master CNA/MA (August 11, 2010 4:07 PM)  O2 Flow:  Room air CC: est med calendar - pt brought all meds with her today.  c/o increased SOB, wheezing, dry cough x2weeks, would also like to discuss increased acid indegestion and states that food "has a hard time going down, like it's stuck." Is Patient Diabetic? No Comments Medications reviewed with patient Daytime contact number verified with patient. Boone Master CNA/MA  August 11, 2010 4:09 PM    Physical Exam  Additional Exam:  Massively obese pleasant amb bf nad  wt  305 May 28, 2008 > 305 July 09, 2008 > 305 November 16, 2008    > 301 November 08, 2009  > 299 June 22, 2010 >>300 08/11/10  Afeb with normal vital signs HEENT: nl dentition, turbinates, and orophanx. Nl external ear canals without cough reflex Neck without JVD/Nodes/TM Lungs distant bs, coarse BS w/ no wheezing  RRR no s3 or murmur or increase in P2. no edema Abd soft and benign with nl excursion in the supine position. Ext warm without calf tenderness, cyanosis clubbing     Impression & Recommendations:  Problem # 1:  INTERSTITIAL LUNG DISEASE (ICD-515) Mild flare  Meds reviewed with pt education and computerized med  calendar completed Plan  Increase Prednisone 10mg  2 tabs once daily for 1 week then back to 1 daily and hold.  follow med calendar closely and bring to each visit.  follow up Dr. Sherene Sires in 6 weeks and as needed  Please contact office for sooner follow up if symptoms do not improve or worsen   Medications Added to Medication List This Visit: 1)  Reglan 10 Mg Tabs (Metoclopramide hcl) .... 1/2 tab by mouth  every morning and at bedtime 2)  Nexium 40 Mg Cpdr (Esomeprazole magnesium) .... Take 1 capsule by mouth before the first and last meals of the day 3)  Fluticasone Propionate 50 Mcg/act Susp (Fluticasone propionate) .... 2 sprays each nostril in the morning and at bedtime 4)  Prednisone 10 Mg Tabs (Prednisone) .... Take 1 tablet by mouth once a day 5)  Oxygen 2l/min  .... Wear at bedtime 6)  Mucinex Dm 30-600 Mg Xr12h-tab (Dextromethorphan-guaifenesin) .... Take 1 tablet every 12 hours as needed 7)  Loratadine 10 Mg Tabs (Loratadine) .... Take 1 tablet by mouth once a day as needed  Complete Medication List: 1)  Reglan 10 Mg Tabs (Metoclopramide hcl) .... 1/2 tab by mouth  every morning and at bedtime 2)  Nexium 40 Mg Cpdr (Esomeprazole magnesium) .... Take 1 capsule by mouth before the first and last meals of the day 3)  Fluticasone Propionate 50 Mcg/act Susp (Fluticasone propionate) .... 2 sprays each nostril in the morning and at bedtime 4)  Detrol La 2 Mg Xr24h-cap (Tolterodine tartrate) .Marland Kitchen.. 1 by mouth once daily 5)  Prednisone 10 Mg Tabs (Prednisone) .... Take 1 tablet by mouth once a day 6)  Pepcid Ac Maximum Strength 20 Mg Tabs (Famotidine) .... One at bedtime 7)  Oxygen 2l/min  .... Wear at bedtime 8)  Mucinex Dm 30-600 Mg Xr12h-tab (Dextromethorphan-guaifenesin) .... Take 1 tablet every 12 hours as needed 9)  Acetaminophen-codeine #4 300-60 Mg Tabs (Acetaminophen-codeine) .Marland Kitchen.. 1-2 every 4-6 hours as needed 10)  Loratadine 10 Mg Tabs (Loratadine) .... Take 1  tablet by mouth once a day as needed  11)  Oxygen  .... Wear 2l/m with activity 12)  Proair Hfa 108 (90 Base) Mcg/act Aers (Albuterol sulfate) .... Inhale 2 puffs every 4-6 hours as needed  Other Orders: Est. Patient Level IV (40981)  Patient Instructions: 1)  Increase Prednisone 10mg  2 tabs once daily for 1 week then back to 1 daily and hold.  2)  follow med calendar closely and bring to each visit.  3)  follow up Dr. Sherene Sires in 6 weeks and as needed  4)  Please contact office for sooner follow up if symptoms do not improve or worsen  Prescriptions: ACETAMINOPHEN-CODEINE #4 300-60 MG TABS (ACETAMINOPHEN-CODEINE) 1-2 every 4-6 hours as needed  #30 x 0   Entered and Authorized by:   Rubye Oaks NP   Signed by:   Kelilah Hebard NP on 08/11/2010   Method used:   Print then Give to Patient   RxID:   [id number] PROAIR HFA 108 (90 BASE) MCG/ACT AERS (ALBUTEROL SULFATE) Inhale 2 puffs every 4-6 hours as needed  #1 x 3   Entered and Authorized by:   Rubye Oaks NP   Signed by:   Chrsitopher Wik NP on 08/11/2010   Method used:   Electronically to        [facility]  [address] [id number] (retail)       [address]       [place], [place]  [address]       Ph: [phone] or [phone]       Fax: [phone]   RxID:   [id number] PEPCID AC MAXIMUM STRENGTH 20 MG TABS (FAMOTIDINE) one at bedtime  #30 x 5   Entered and Authorized by:   Rubye Oaks NP   Signed by:   Shawntee Mainwaring NP on 08/11/2010   Method used:   Electronically to        [facility]  [address] [id number] (retail)       [address]       [place], [place]  [address]       Ph: [phone] or [phone]       Fax: [phone]   RxID:   [id number] FLUTICASONE PROPIONATE 50 MCG/ACT SUSP (FLUTICASONE PROPIONATE) 2 sprays in each nostril twice daily  HAVE NOT USED THIS- ONLY USES AS NEEDED  #1 x 5   Entered and Authorized by:   Rubye Oaks NP   Signed by:   Texas Oborn NP on  08/11/2010   Method used:   [facility]ically to        [facility]  [address] [id number] ([facility]       [address]       [place], [place]  [address]       Ph: [phone] or [phone]       Fax: [phone]   RxID:   [id number]

## 2010-10-31 NOTE — Progress Notes (Signed)
Summary: Referral  Phone Note Call from Patient   Caller: Patient [phone] Summary of Call: Pt called requesting bone density test for LT knee pain. Is an Ortho referral more approriate, pt does not have a Dx of OA or osteoporosis Initial call taken by: [name] Pyle, CMA,  June 28, 2010 4:29 PM  Follow-up for Phone Call        yes, should see ortho, not bone density... prev referred to [facility] 08/2009 for ankle pain... does she wish to return there or other ortho pref? - will order once pt gives pref - thanks Follow-up by: Newt Lukes MD,  June 28, 2010 5:23 PM  Additional Follow-up for Phone Call Additional follow up Details #1::        Pt advised that Ortho would be better and to call back if new referral or appt with [facility] is needed. Additional Follow-up by: [name] Pyle, CMA,  June 29, 2010 8:10 AM    Additional Follow-up for Phone Call Additional follow up Details #2::    Per pt she would like to be referred to Ortho [facility] Follow-up by: [name] Pyle, CMA,  June 29, 2010 11:07 AM

## 2010-10-31 NOTE — Assessment & Plan Note (Signed)
Summary: ALLERGIES/ VAL'S PT /NWS   Vital Signs:  Patient profile:   40 year old female Height:      61 inches Weight:      291.75 pounds BMI:     55.32 O2 Sat:      95 % on Room air Temp:     97.2 degrees F oral Pulse rate:   97 / minute BP sitting:   108 / 60  (left arm) Cuff size:   large  Vitals Entered ByZella Ball Ewing (January 05, 2010 3:12 PM)  O2 Flow:  Room air CC: Nasal congestion, eyes red, itching, cough/RE   Primary Care Provider:  Newt Lukes MD  CC:  Nasal congestion, eyes red, itching, and cough/RE.  History of Present Illness: Here with above , marked and worsening over the past 2 to 3 wks, without fever, headache, facial pain, colored d/c, tongue swelling or rash or angioedema;  Pt denies CP,  orthopnea, pnd, worsening LE edema, palps, dizziness or syncope OTC meds not working.  But has had ? mild worsening dyspnea and  mild wheezing in the past wk wtih nighttime awakenings with sob, despite current meds including the daily prednisone. Also Only using the flonase very infrequently.  Problems Prior to Update: 1)  Allergic Rhinitis  (ICD-477.9) 2)  Conjunctivitis, Allergic, Seasonal  (ICD-372.14) 3)  Special Screening For Osteoporosis  (ICD-V82.81) 4)  Ankle Pain, Bilateral  (ICD-719.47) 5)  Muscle Spasm  (ICD-728.85) 6)  Hypertension  (ICD-401.9) 7)  Depression, Mild  (ICD-311) 8)  Headache  (ICD-784.0) 9)  Urinary Incontinence, Urge  (ICD-788.31) 10)  Chronic Rhinitis  (ICD-472.0) 11)  Dyspnea  (ICD-786.05) 12)  Gastroesophageal Reflux Disease  (ICD-530.81) 13)  Uri, Acute  (ICD-465.9) 14)  Cough  (ICD-786.2) 15)  Hx of Bronchitis  (ICD-490) 16)  Interstitial Lung Disease  (ICD-515) 17)  Morbid Obesity  (ICD-278.01)  Medications Prior to Update: 1)  Reglan 10 Mg  Tabs (Metoclopramide Hcl) .Marland Kitchen.. 1 Tab By Mouth Before Every Meal and At Bedtime 2)  Nexium 40 Mg  Pack (Esomeprazole Magnesium) .... Take One 30-60 Min Before First and Last Meals of The  Day 3)  Fluticasone Propionate 50 Mcg/act Susp (Fluticasone Propionate) .... 2 Sprays in Each Nostril Twice Daily 4)  Detrol La 2 Mg Xr24h-Cap (Tolterodine Tartrate) .Marland Kitchen.. 1 By Mouth Once Daily 5)  B-12 1000 Mcg Subl (Cyanocobalamin) .Marland Kitchen.. 1 Once Daily 6)  Oxygen .... 2 Liters Per Minutes At Bedtime 7)  Pepcid Ac Maximum Strength 20 Mg Tabs (Famotidine) .... One At Bedtime 8)  Mucinex Dm 30-600 Mg Xr12h-Tab (Dextromethorphan-Guaifenesin) .... Take 1-2 Tablets Every 12 Hours As Needed 9)  Acetaminophen-Codeine #4 300-60 Mg Tabs (Acetaminophen-Codeine) .Marland Kitchen.. 1-2 Every 4-6 Hours As Needed 10)  Prednisone 10 Mg Tabs (Prednisone) .... Take 1 By Mouth Qd 11)  Proair Hfa 108 (90 Base) Mcg/act Aers (Albuterol Sulfate) .... Inhale 2 Puffs Every 4-6 Hours As Needed 12)  Loratadine 10 Mg Tabs (Loratadine) .... Take 1 Tablet By Mouth Once A Day As Needed 13)  Oxygen .... Wear 2l/m With Activity 14)  Amlodipine Besylate 5 Mg Tabs (Amlodipine Besylate) .Marland Kitchen.. 1 By Mouth Once Daily  Current Medications (verified): 1)  Reglan 10 Mg  Tabs (Metoclopramide Hcl) .Marland Kitchen.. 1 Tab By Mouth Before Every Meal and At Bedtime 2)  Nexium 40 Mg  Pack (Esomeprazole Magnesium) .... Take One 30-60 Min Before First and Last Meals of The Day 3)  Fluticasone Propionate 50 Mcg/act Susp (Fluticasone Propionate) .Marland KitchenMarland KitchenMarland Kitchen  2 Sprays in Each Nostril Twice Daily 4)  Detrol La 2 Mg Xr24h-Cap (Tolterodine Tartrate) .Marland Kitchen.. 1 By Mouth Once Daily 5)  B-12 1000 Mcg Subl (Cyanocobalamin) .Marland Kitchen.. 1 Once Daily 6)  Oxygen .... 2 Liters Per Minutes At Bedtime 7)  Pepcid Ac Maximum Strength 20 Mg Tabs (Famotidine) .... One At Bedtime 8)  Mucinex Dm 30-600 Mg Xr12h-Tab (Dextromethorphan-Guaifenesin) .... Take 1-2 Tablets Every 12 Hours As Needed 9)  Acetaminophen-Codeine #4 300-60 Mg Tabs (Acetaminophen-Codeine) .Marland Kitchen.. 1-2 Every 4-6 Hours As Needed 10)  Prednisone 10 Mg Tabs (Prednisone) .... Take 1 By Mouth Qd 11)  Proair Hfa 108 (90 Base) Mcg/act Aers (Albuterol  Sulfate) .... Inhale 2 Puffs Every 4-6 Hours As Needed 12)  Cetirizine Hcl 10 Mg Tabs (Cetirizine Hcl) .Marland Kitchen.. 1po Once Daily As Needed Allergies 13)  Oxygen .... Wear 2l/m With Activity 14)  Amlodipine Besylate 5 Mg Tabs (Amlodipine Besylate) .Marland Kitchen.. 1 By Mouth Once Daily 15)  Prednisone 10 Mg Tabs (Prednisone) .... 4po Qd For 3days, Then 3po Qd For 3days, Then 2po Qd For 3days, Then 1po Qd For 3 Days, Then Stop 16)  Advair Diskus 250-50 Mcg/dose Aepb (Fluticasone-Salmeterol) .Marland Kitchen.. 1 Puff Two Times A Day  Allergies (verified): 1)  ! Penicillin  Past History:  Past Medical History: Last updated: 11/08/2009 GASTROESOPHAGEAL REFLUX DISEASE (ICD-530.81) INTERSTITIAL LUNG DISEASE (ICD-515)    -VATS 02/15/2004 NSIP ([place]zenstein)   -Restart Prednisone 02/24/08   -PFT's December 23, 2008 VC 35%  DLC0 31% MORBID OBESITY (ICD-278.01)   - Target wt  =  153  for BMI < 30  Health Maintenance......................................................Marland KitchenMarland KitchenFelicity Coyer    - Pneumovax July 22, 2009     -Complex med review August 29, 2009    Social History: Last updated: 04/20/2009 Patient never smoked.  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  Risk Factors: Smoking Status: never (10/10/2007)  Review of Systems       all otherwise negative per pt -    Physical Exam  General:  alert and overweight-appearing.  , not ill appearing Head:  normocephalic and atraumatic.   Eyes:  vision grossly intact, pupils equal, and pupils round.   Ears:  bilat tm's mild erythema, sinus nontender Nose:  nasal dischargemucosal pallor and mucosal edema.   Mouth:  pharyngeal erythema and fair dentition.   Neck:  supple and no masses.   Lungs:  normal respiratory effort, R decreased breath sounds, and L decreased breath sounds.   Heart:  normal rate and regular rhythm.   Extremities:  no edema, no erythema  Skin:  color normal and no  rashes.     Impression & Recommendations:  Problem # 1:  CONJUNCTIVITIS, ALLERGIC, SEASONAL (ICD-372.14) for OTC zaditor as needed   Problem # 2:  ALLERGIC RHINITIS (ICD-477.9)  Her updated medication list for this problem includes:    Fluticasone Propionate 50 Mcg/act Susp (Fluticasone propionate) .Marland Kitchen... 2 sprays in each nostril twice daily    Cetirizine Hcl 10 Mg Tabs (Cetirizine hcl) .Marland Kitchen... 1po once daily as needed allergies for depo shot today, change loratidine to zyrtec as needed , prednisone burst and taper off,  cont the flonase but be more consistent with usage  Orders: Depo- Medrol 40mg  (J1030) Depo- Medrol 80mg  (J1040) Admin of Therapeutic Inj  intramuscular or subcutaneous ([address])  Problem # 3:  DYSPNEA (ICD-786.05) has known ILD ,   ?? mild  wheezing/asthma as well - for tx above tday ,  for advair sample given today as well   Problem # 4:  HYPERTENSION (ICD-401.9)  Her updated medication list for this problem includes:    Amlodipine Besylate 5 Mg Tabs (Amlodipine besylate) .Marland Kitchen... 1 by mouth once daily  BP today: 108/60 Prior BP: 122/70 (11/08/2009)  Labs Reviewed: K+: 4.4 (09/07/2009) Creat: : 0.8 (09/07/2009)    stable overall by hx and exam, ok to continue meds/tx as is   Complete Medication List: 1)  Reglan 10 Mg Tabs (Metoclopramide hcl) .Marland Kitchen.. 1 tab by mouth before every meal and at bedtime 2)  Nexium 40 Mg Pack (Esomeprazole magnesium) .... Take one 30-60 min before first and last meals of the day 3)  Fluticasone Propionate 50 Mcg/act Susp (Fluticasone propionate) .... 2 sprays in each nostril twice daily 4)  Detrol La 2 Mg Xr24h-cap (Tolterodine tartrate) .Marland Kitchen.. 1 by mouth once daily 5)  B-12 1000 Mcg Subl (Cyanocobalamin) .Marland Kitchen.. 1 once daily 6)  Oxygen  .... 2 liters per minutes at bedtime 7)  Pepcid Ac Maximum Strength 20 Mg Tabs (Famotidine) .... One at bedtime 8)  Mucinex Dm 30-600 Mg Xr12h-tab (Dextromethorphan-guaifenesin) .... Take 1-2 tablets every 12  hours as needed 9)  Acetaminophen-codeine #4 300-60 Mg Tabs (Acetaminophen-codeine) .Marland Kitchen.. 1-2 every 4-6 hours as needed 10)  Prednisone 10 Mg Tabs (Prednisone) .... Take 1 by mouth qd 11)  Proair Hfa 108 (90 Base) Mcg/act Aers (Albuterol sulfate) .... Inhale 2 puffs every 4-6 hours as needed 12)  Cetirizine Hcl 10 Mg Tabs (Cetirizine hcl) .Marland Kitchen.. 1po once daily as needed allergies 13)  Oxygen  .... Wear 2l/m with activity 14)  Amlodipine Besylate 5 Mg Tabs (Amlodipine besylate) .Marland Kitchen.. 1 by mouth once daily 15)  Prednisone 10 Mg Tabs (Prednisone) .... 4po qd for 3days, then 3po qd for 3days, then 2po qd for 3days, then 1po qd for 3 days, then stop 16)  Advair Diskus 250-50 Mcg/dose Aepb (Fluticasone-salmeterol) .Marland Kitchen.. 1 puff two times a day  Patient Instructions: 1)  you had the steroid shot today 2)  stop the loratidine (claritin) 3)  You can take Zaditor (OTC) eye drops for the eye swelling 4)  Please take all new medications as prescribed - the higher dose prednisone, generic zyrtec, and advair sample at 1 puff twice per day 5)  Continue all previous medications as before this visit, including regular use of the generic flonase (fluticasone); please hold your regular dose of prednisone 10 mg while taking the higher dose prednisone  6)  I don't think you will need the advair after the sample is gone, but if the symptoms return, you have the prescription for advair 7)  Please schedule an appointment with your primary doctor if the symptoms return or worsen Prescriptions: ADVAIR DISKUS 250-50 MCG/DOSE AEPB (FLUTICASONE-SALMETEROL) 1 puff two times a day  #1 x 11   Entered and Authorized by:   Corwin Levins MD   Signed by:   Corwin Levins MD on 01/05/2010   Method used:   Print then Give to Patient   RxID:   [id number] FLUTICASONE PROPIONATE 50 MCG/ACT SUSP (FLUTICASONE PROPIONATE) 2 sprays in each nostril twice daily  #1 x 11   Entered and Authorized by:   Corwin Levins MD   Signed by:   Corwin Levins MD on 01/05/2010   Method used:   Print then Give to Patient   RxID:   [id number] PREDNISONE 10 MG TABS (  PREDNISONE) 4po qd for 3days, then 3po qd for 3days, then 2po qd for 3days, then 1po qd for 3 days, then stop  #30 x 0   Entered and Authorized by:   Corwin Levins MD   Signed by:   Corwin Levins MD on 01/05/2010   Method used:   Print then Give to Patient   RxID:   [id number] CETIRIZINE HCL 10 MG TABS (CETIRIZINE HCL) 1po once daily as needed allergies  #30 x 11   Entered and Authorized by:   Corwin Levins MD   Signed by:   Corwin Levins MD on 01/05/2010   Method used:   Print then Give to Patient   RxID:   [id number]    Medication Administration  Injection # 1:    Medication: Depo- Medrol 40mg     Diagnosis: ALLERGIC RHINITIS (ICD-477.9)    Route: IV    Site: LUOQ gluteus    Exp Date: 08/2012    Lot #: [id number]    Mfr: [employer]    Given by: Zella Ball Ewing (January 05, 2010 4:12 PM)  Injection # 2:    Medication: Depo- Medrol 80mg     Diagnosis: ALLERGIC RHINITIS (ICD-477.9)    Route: IM    Site: LUOQ gluteus    Exp Date: 08/2012    Lot #: [id number]    Mfr: [employer]    Given by: Zella Ball Ewing (January 05, 2010 4:12 PM)  Orders Added: 1)  Depo- Medrol 40mg  [J1030] 2)  Depo- Medrol 80mg  [J1040] 3)  Admin of Therapeutic Inj  intramuscular or subcutaneous [96372] 4)  Est. Patient Level IV [40102]

## 2010-11-01 ENCOUNTER — Other Ambulatory Visit: Payer: Self-pay

## 2010-11-01 ENCOUNTER — Ambulatory Visit: Admit: 2010-11-01 | Payer: Self-pay | Admitting: Internal Medicine

## 2010-11-06 ENCOUNTER — Encounter (INDEPENDENT_AMBULATORY_CARE_PROVIDER_SITE_OTHER): Payer: BC Managed Care – PPO | Admitting: Internal Medicine

## 2010-11-06 ENCOUNTER — Other Ambulatory Visit: Payer: Self-pay | Admitting: Internal Medicine

## 2010-11-06 ENCOUNTER — Encounter: Payer: Self-pay | Admitting: Internal Medicine

## 2010-11-06 ENCOUNTER — Inpatient Hospital Stay
Admission: RE | Admit: 2010-11-06 | Discharge: 2010-11-06 | Disposition: A | Discharge status: Home / Self Care | Payer: BC Managed Care – PPO | Source: Ambulatory Visit | Attending: Internal Medicine | Admitting: Internal Medicine

## 2010-11-06 DIAGNOSIS — R1319 Other dysphagia: Secondary | ICD-10-CM | POA: Insufficient documentation

## 2010-11-06 DIAGNOSIS — K219 Gastro-esophageal reflux disease without esophagitis: Secondary | ICD-10-CM

## 2010-11-06 DIAGNOSIS — M25579 Pain in unspecified ankle and joints of unspecified foot: Secondary | ICD-10-CM

## 2010-11-06 DIAGNOSIS — Z0000 Encounter for general adult medical examination without abnormal findings: Secondary | ICD-10-CM

## 2010-11-06 DIAGNOSIS — J8410 Pulmonary fibrosis, unspecified: Secondary | ICD-10-CM

## 2010-11-06 DIAGNOSIS — Z13820 Encounter for screening for osteoporosis: Secondary | ICD-10-CM

## 2010-11-07 ENCOUNTER — Ambulatory Visit (INDEPENDENT_AMBULATORY_CARE_PROVIDER_SITE_OTHER): Payer: BC Managed Care – PPO | Admitting: Internal Medicine

## 2010-11-07 ENCOUNTER — Encounter: Payer: Self-pay | Admitting: Internal Medicine

## 2010-11-07 DIAGNOSIS — R05 Cough: Secondary | ICD-10-CM

## 2010-11-07 DIAGNOSIS — J8410 Pulmonary fibrosis, unspecified: Secondary | ICD-10-CM

## 2010-11-15 ENCOUNTER — Other Ambulatory Visit: Payer: Self-pay | Admitting: Internal Medicine

## 2010-11-16 ENCOUNTER — Ambulatory Visit (HOSPITAL_COMMUNITY): Payer: BC Managed Care – PPO | Attending: Internal Medicine

## 2010-11-16 ENCOUNTER — Encounter: Payer: Self-pay | Admitting: Internal Medicine

## 2010-11-16 ENCOUNTER — Ambulatory Visit (HOSPITAL_COMMUNITY)
Admission: RE | Admit: 2010-11-16 | Discharge: 2010-11-16 | Disposition: A | Discharge status: Home / Self Care | Payer: BC Managed Care – PPO | Source: Ambulatory Visit | Attending: Internal Medicine | Admitting: Internal Medicine

## 2010-11-16 DIAGNOSIS — R1310 Dysphagia, unspecified: Secondary | ICD-10-CM | POA: Insufficient documentation

## 2010-11-16 NOTE — Assessment & Plan Note (Signed)
Summary: cpx/bcbs/#/cd   Vital Signs:  Patient profile:   40 year old female Height:      61 inches (154.94 cm) Weight:      300.00 pounds (136.36 kg) O2 Sat:      94 % on Room air Temp:     98.7 degrees F (37.06 degrees C) oral Pulse rate:   104 / minute BP sitting:   124 / 82  (left arm)  Vitals Entered By: Orlan Leavens RMA (November 06, 2010 11:03 AM)  O2 Flow:  Room air CC: CPX Is Patient Diabetic? No Pain Assessment Patient in pain? no      Comments Need refill on detrol LA   Primary Care Provider:  Newt Lukes MD  CC:  CPX.  History of Present Illness: patient is here today for annual physical. Patient feels well and has no complaints.   also review chronic med issues: ILD - no change pred dose or mdi needs chronic doe w/o changes - follows with pulm for same  GERD - reports compliance with ongoing medical treatment and no changes in medication dose or frequency. denies adverse side effects related to current therapy. c/o cough after swallowing - "chokes" easily - not every swallow but inc freq - no pain swallow or weight loss  c/o ankle pain R>L side - no recent trauma, no swelling - interferes with exercise efforts - wouldlike to see ortho for same  also ? if needs bone density test given chronic years of pred use only occ takes Calcium due to constipation   Preventive Screening-Counseling & Management  Alcohol-Tobacco     Alcohol drinks/day: 0     Alcohol Counseling: not indicated; patient does not drink     Smoking Status: never     Tobacco Counseling: not indicated; no tobacco use  Caffeine-Diet-Exercise     Diet Counseling: to improve diet; diet is suboptimal     Does Patient Exercise: no     Exercise Counseling: to improve exercise regimen     Depression Counseling: not indicated; screening negative for depression  Safety-Violence-Falls     Seat Belt Counseling: not indicated; patient wears seat belts     Helmet Counseling: not  applicable     Firearm Counseling: not applicable     Violence Counseling: not indicated; no violence risk noted     Fall Risk Counseling: not indicated; no significant falls noted  Clinical Review Panels:  Prevention   Last Pap Smear:  Normal (03/24/2009)  Immunizations   Last Tetanus Booster:  Tdap (03/24/2009)   Last Flu Vaccine:  Fluvax 3+ (07/22/2009)   Last Pneumovax:  Pneumovax (07/22/2009)  Lipid Management   Cholesterol:  185 (10/31/2010)   LDL (bad choesterol):  135 (10/31/2010)   HDL (good cholesterol):  42.10 (10/31/2010)  CBC   WBC:  6.4 (10/31/2010)   RBC:  4.30 (10/31/2010)   Hgb:  12.9 (10/31/2010)   Hct:  38.6 (10/31/2010)   Platelets:  218.0 (10/31/2010)   MCV  89.8 (10/31/2010)   MCHC  33.4 (10/31/2010)   RDW  14.9 (10/31/2010)   PMN:  59.2 (10/31/2010)   Lymphs:  32.3 (10/31/2010)   Monos:  7.9 (10/31/2010)   Eosinophils:  0.2 (10/31/2010)   Basophil:  0.4 (10/31/2010)  Complete Metabolic Panel   Glucose:  91 (10/31/2010)   Sodium:  140 (10/31/2010)   Potassium:  4.7 (10/31/2010)   Chloride:  105 (10/31/2010)   CO2:  29 (10/31/2010)   BUN:  11 (10/31/2010)  Creatinine:  0.5 (10/31/2010)   Albumin:  3.6 (10/31/2010)   Total Protein:  7.7 (10/31/2010)   Calcium:  9.2 (10/31/2010)   Total Bili:  0.5 (10/31/2010)   Alk Phos:  59 (10/31/2010)   SGPT (ALT):  23 (10/31/2010)   SGOT (AST):  18 (10/31/2010)   Current Medications (verified): 1)  Reglan 10 Mg  Tabs (Metoclopramide Hcl) .... 1/2 Tab By Mouth  Every Morning and At Bedtime 2)  Nexium 40 Mg Cpdr (Esomeprazole Magnesium) .... Take 1 Capsule By Mouth Before The First and Last Meals of The Day 3)  Fluticasone Propionate 50 Mcg/act Susp (Fluticasone Propionate) .... 2 Sprays Each Nostril in The Morning and At Bedtime 4)  Detrol La 2 Mg Xr24h-Cap (Tolterodine Tartrate) .Marland Kitchen.. 1 By Mouth Once Daily 5)  Prednisone 10 Mg Tabs (Prednisone) .... Take 1 Tablet By Mouth Once A Day 6)   Pepcid Ac Maximum Strength 20 Mg Tabs (Famotidine) .... One At Bedtime 7)  Oxygen 2l/min .... Wear At Bedtime 8)  Mucinex Dm 30-600 Mg Xr12h-Tab (Dextromethorphan-Guaifenesin) .... Take 1 Tablet Every 12 Hours As Needed 9)  Acetaminophen-Codeine #4 300-60 Mg Tabs (Acetaminophen-Codeine) .Marland Kitchen.. 1-2 Every 4-6 Hours As Needed 10)  Loratadine 10 Mg Tabs (Loratadine) .... Take 1 Tablet By Mouth Once A Day As Needed 11)  Oxygen .... Wear 2l/m With Activity 12)  Proair Hfa 108 (90 Base) Mcg/act Aers (Albuterol Sulfate) .... Inhale 2 Puffs Every 4-6 Hours As Needed  Allergies (verified): 1)  ! Penicillin  Past History:  Past medical, surgical, family and social histories (including risk factors) reviewed, and no changes noted (except as noted below).  Past Medical History: GASTROESOPHAGEAL REFLUX DISEASE     - Taper reglan to 10 mg one half twice daily June 22, 2010  INTERSTITIAL LUNG DISEASE   -VATS 02/15/2004 NSIP Adrienne Mocha)   -Restart Prednisone 02/24/08   -PFT's December 23, 2008 VC 35%  DLC0 31% MORBID OBESITY   - Target wt  =  153  for BMI < 30  Hypertension  Health Maintenance..........................................................Marland KitchenMarland KitchenFelicity Coyer    - Pneumovax July 22, 2009     -Complex med review August 29, 2009  MD roster: Erline Hau - wert    Past Surgical History: VATS 02/15/2004  Family History: Reviewed history from 03/17/2008 and no changes required. positive sarcoid and her mother diagnosed in this clinic by transbronchial bx  positive heart disease in her father and diabetes family history of allergies in both parents both grandmothers had cancer of unknown type patient has 1 sibling with no significant medical history  dad expired 80s due to AMI, known CAD hx, DM, &noncompliance  Social History: Reviewed history from 04/20/2009 and no changes required. 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 I have reviewed all other systems and they were negative.   Physical Exam  General:  alert and overweight-appearing.  spouse at side Head:  Normocephalic and atraumatic without obvious abnormalities. No apparent alopecia or balding. Eyes:  vision grossly intact; pupils equal, round and reactive to light.  conjunctiva and lids normal.    Ears:  R ear normal and L ear normal.   Mouth:  teeth and gums in good repair; mucous membranes moist, without lesions or ulcers. oropharynx clear without exudate, no erythema.  Neck:  thick, supple, full ROM, no masses, no thyromegaly; no thyroid nodules or tenderness. no JVD or carotid bruits.   Lungs:  normal respiratory effort, no intercostal retractions or use of accessory muscles; normal breath sounds bilaterally - no crackles and no wheezes.    Heart:  normal rate, regular rhythm, no murmur, and no rub. BLE without edema. Abdomen:  obese, soft, non-tender, normal bowel sounds, no distention; no masses and no appreciable hepatomegaly or splenomegaly.   Genitalia:  defer gyn Msk:  both ankles with FROM, nonpainful, even with weight bearing - no effusion or erythema Neurologic:  alert & oriented X3 and cranial nerves II-XII symetrically intact.  strength normal in all extremities, sensation intact to light touch, and gait normal. speech fluent without dysarthria or aphasia; follows commands with good comprehension.  Skin:  color normal and no rashes.   Psych:  Oriented X3, memory intact for recent and remote, normally interactive, good eye contact, not anxious appearing, not depressed appearing, and not agitated.      Impression & Recommendations:  Problem # 1:  PREVENTIVE HEALTH CARE (ICD-V70.0) Patient has been counseled on age-appropriate routine health concerns for screening and prevention. These are reviewed and up-to-date. Immunizations are  up-to-date or declined. Labs reviewed. refer to gyn  Orders: EKG w/ Interpretation (93000)  Problem # 2:  SPECIAL SCREENING FOR OSTEOPOROSIS (ICD-V82.81) chronic pred use for ILD - refer for bone density Orders: T-Bone Densitometry (04540)  Problem # 3:  ANKLE PAIN, BILATERAL (ICD-719.47)  Orders: Orthopedic Surgeon Referral (Ortho Surgeon)  exam benign -  refer to ortho for further eval as needed   Problem # 4:  OTHER DYSPHAGIA (ICD-787.29) cont tx for GERD ans arrrange swallow eval Orders: Misc. Referral (Misc. Ref)  Problem # 5:  GASTROESOPHAGEAL REFLUX DISEASE (ICD-530.81)  Her updated medication list for this problem includes:    Nexium 40 Mg Cpdr (Esomeprazole magnesium) .Marland Kitchen... Take 1 capsule by mouth before the first and last meals of the day    Pepcid Ac Maximum Strength 20 Mg Tabs (Famotidine) ..... One at bedtime  Labs Reviewed: Hgb: 12.9 (10/31/2010)   Hct: 38.6 (10/31/2010)  Problem # 6:  INTERSTITIAL LUNG DISEASE (ICD-515) per pulm - 08/2010 OV reviewed: Mild flare  Increase Prednisone 10mg  2 tabs once daily for 1 week then back to 1 daily and hold.  follow med calendar closely and bring to each visit.  follow up Dr. Sherene Sires Please contact office for sooner follow up if symptoms do not improve or worsen   Problem # 7:  MORBID OBESITY (ICD-278.01)  wt loss would go better if used 02 with ex as rec previously  Ht: 61 (11/06/2010)   Wt: 300.00 (11/06/2010)   BMI: 56.94 (08/11/2010)  Complete Medication List: 1)  Reglan 10 Mg Tabs (Metoclopramide hcl) .... 1/2 tab by mouth  every morning and at bedtime 2)  Nexium 40 Mg Cpdr (Esomeprazole magnesium) .... Take 1 capsule by mouth before the first and last meals of the day 3)  Fluticasone Propionate 50 Mcg/act Susp (Fluticasone propionate) .... 2 sprays each nostril in the morning and at bedtime 4)  Detrol La 2 Mg Xr24h-cap (Tolterodine tartrate) .Marland Kitchen.. 1 by mouth once daily 5)  Prednisone 10 Mg  Tabs (Prednisone) .... Take 1 tablet by mouth once a day 6)  Pepcid Ac Maximum Strength 20 Mg Tabs (Famotidine) .... One at bedtime 7)  Oxygen 2l/min  .... Wear at bedtime 8)  Mucinex Dm 30-600 Mg Xr12h-tab (Dextromethorphan-guaifenesin) .Marland KitchenMarland KitchenMarland Kitchen  Take 1 tablet every 12 hours as needed 9)  Acetaminophen-codeine #4 300-60 Mg Tabs (Acetaminophen-codeine) .Marland Kitchen.. 1-2 every 4-6 hours as needed 10)  Loratadine 10 Mg Tabs (Loratadine) .... Take 1 tablet by mouth once a day as needed 11)  Oxygen  .... Wear 2l/m with activity 12)  Proair Hfa 108 (90 Base) Mcg/act Aers (Albuterol sulfate) .... Inhale 2 puffs every 4-6 hours as needed  Other Orders: Gynecologic Referral (Gyn)  Patient Instructions: 1)  it was good to see you today. 2)  labs reviewed - no changes recommended at this time 3)  we'll make referral to ortho for your ankle and gynecology for your PAP/pelvic. Also for swallowing evaluation. Our office will contact you regarding these appointments once made.  4)  bone density as discussed 5)  it is important that you work on losing weight - monitor your diet and consume fewer calories such as less carbohydrates (sugar) and less fat. you also need to increase your physical activity level - start by walking for 10-20 minutes 3 times per week and work up to 30 minutes 4-5 times each week.  6)  Please schedule a follow-up appointment in 6 months for weight check and review, call sooner if problems.  Prescriptions: DETROL LA 2 MG XR24H-CAP (TOLTERODINE TARTRATE) 1 by mouth once daily  #30 Capsule x 11   Entered by:   Orlan Leavens RMA   Authorized by:   Newt Lukes MD   Signed by:   Orlan Leavens RMA on 11/06/2010   Method used:   Electronically to        [facility] (retail)       [address]       [place], [place]  [address]       Ph: [phone]       Fax: [phone]   RxID:   [id number]    Orders Added: 1)  EKG w/ Interpretation [93000] 2)  T-Bone Densitometry  [84132] 3)  Orthopedic Surgeon Referral [Ortho Surgeon] 4)  Gynecologic Referral [Gyn] 5)  Misc. Referral [Misc. Ref] 6)  Est. Patient 18-39 years [99395] 7)  Est. Patient Level III [99213]    Prevention & Chronic Care Immunizations   Influenza vaccine: Fluvax 3+  (07/22/2009)    Tetanus booster: 03/24/2009: Tdap    Pneumococcal vaccine: Pneumovax  (07/22/2009)  Other Screening   Pap smear: Normal  (03/24/2009)   Smoking status: never  (11/06/2010)  Lipids   Total Cholesterol: 185  (10/31/2010)   LDL: 135  (10/31/2010)   LDL Direct: Not documented   HDL: 42.10  (10/31/2010)   Triglycerides: 40.0  (10/31/2010)  Hypertension   Last Blood Pressure: 124 / 82  (11/06/2010)   Serum creatinine: 0.5  (10/31/2010)   Serum potassium 4.7  (10/31/2010)  Self-Management Support :    Hypertension self-management support: Not documented

## 2010-11-16 NOTE — Assessment & Plan Note (Signed)
Summary: Pulmonary/ext ov    Primary Provider/Referring Provider:  Newt Lukes MD  CC:  Dyspnea- improved.  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  Since onset waxing/waning sx requiring intermittent prednisone tapers with only minimal improvement - most of her symptoms chronically have been related to obesity with dyspnea on exertion and tendency to chronic cough which is felt to be at least partly  reflux related.     10/9/9  ov  maintained on 10 mg/day without flare of cough or sob.   Feb 17, 2009 better until 2 weeks ago when tapered prednisone to   10-5 -10 >  more sob, eyes running and swollen sneezing not responding flonase or clariton  rec 10 mg 2 until better then 2-1-2 and flared with cough on that dose so resumed 2 daily but still cough with deep breath. rec restart 20/day  June 10, 2009 on 20 mg no cough, worried about the ragweed season minimal symptoms. using med calendar well. rec  try 20 a/w 10  07/22/09 doing fine so try floor 10 mg per day, ceiling of 20mg  per day.  August 22, 2009 Followup per Dr Shelle Iron.  Pt was seen here by [facility] on 11/19  for cough rx prednisone burst and add back reglan but just at hs..  Pt states that her cough is worse.  Cough is prod with yellow sputum.  Pt states that her voice "comes and goes".  Breathing is better when she is not coughing. She is taking 40 mg of prednisone daily.  --Tx w/ Levaquin, pred burst, reglan and pepcid added.   September 27, 2009 ov all better on prednisone 10mg  and back to baseline ex tolerance, cough control.   November 08, 2009 ov loosing wt voluntarily, no cough, baseline doe on 02 - rec taper reglan, keep up with med calendar  see page 2 June 22, 2010 Followup.  Pt states that she has been doing well. He has not had any trouble with cough or with SOB.  She does c/o some sinus pressure/drainage x 3 days. not using med  calendar or amlodipine or advair. Not using 02 with ex, not able to loose wt.  >>decrease pred 10 and 5    August 11, 2010 --Presents for follow up and med review. We reviewed her meds and updated her med calendar. Feels over last week her breathing not quite as good. She  has increased her prednisone to 10mg  once daily. Was not able to decrease steroids to 1/2 every other day. no discolred mucus, fever. Denies chest pain, dyspnea, orthopnea, hemoptysis, fever, n/v/d, edema, headache. Requests refills today. Under some stress. Father recently passed. Strained relationship with mother.  rec no change in rx  November 07, 2010 ov cc cough/ strangling  a bit more with taper off reglan (actually worse before ran out of the low dose reglan)  no excess mucus or increase sob on prednisone @  10 mg daily .  Using med calendar well. Pt denies any significant sore throat, dysphagia, itching, sneezing,  nasal congestion or excess secretions,  fever, chills, sweats, unintended wt loss, pleuritic or exertional cp, hempoptysis, change in activity tolerance  orthopnea pnd or leg swelling Pt also denies any obvious fluctuation in symptoms with weather or environmental change or other alleviating or aggravating factors.        Current Medications (verified): 1)  Reglan  10 Mg  Tabs (Metoclopramide Hcl) .... 1/2 Tab By Mouth  Every Morning and At Bedtime- Ran Out 2)  Nexium 40 Mg Cpdr (Esomeprazole Magnesium) .... Take 1 Capsule By Mouth Before The First and Last Meals of The Day 3)  Fluticasone Propionate 50 Mcg/act Susp (Fluticasone Propionate) .... 2 Sprays Each Nostril in The Morning and At Bedtime 4)  Detrol La 2 Mg Xr24h-Cap (Tolterodine Tartrate) .Marland Kitchen.. 1 By Mouth Once Daily 5)  Prednisone 10 Mg Tabs (Prednisone) .... Take 1 Tablet By Mouth Once A Day 6)  Pepcid Ac Maximum Strength 20 Mg Tabs (Famotidine) .... One At Bedtime 7)  Oxygen 2l/min .... Wear At Bedtime 8)  Mucinex Dm 30-600 Mg Xr12h-Tab  (Dextromethorphan-Guaifenesin) .... Take 1 Tablet Every 12 Hours As Needed 9)  Acetaminophen-Codeine #4 300-60 Mg Tabs (Acetaminophen-Codeine) .Marland Kitchen.. 1-2 Every 4-6 Hours As Needed 10)  Loratadine 10 Mg Tabs (Loratadine) .... Take 1 Tablet By Mouth Once A Day As Needed 11)  Oxygen .... Wear 2l/m With Activity 12)  Proair Hfa 108 (90 Base) Mcg/act Aers (Albuterol Sulfate) .... Inhale 2 Puffs Every 4-6 Hours As Needed-Ran Out  Allergies (verified): 1)  ! Penicillin  Vital Signs:  Patient profile:   40 year old female Weight:      302 pounds O2 Sat:      97 % on Room air Temp:     98.6 degrees F oral Pulse rate:   77 / minute BP sitting:   130 / 86  (left arm) Cuff size:   large  Vitals Entered By: Vernie Murders (November 07, 2010 3:01 PM)  O2 Flow:  Room air  Physical Exam  Additional Exam:  Massively obese pleasant amb bf nad  wt  305 May 28, 2008 > 305 July 09, 2008 > 305 November 16, 2008    >  302 November 07, 2010  HEENT: nl dentition, turbinates, and orophanx. Nl external ear canals without cough reflex Neck without JVD/Nodes/TM Lungs distant bs, coarse BS w/ no wheezing / no cough on insp RRR no s3 or murmur or increase in P2. no edema Abd soft and benign with nl excursion in the supine position. Ext warm without calf tenderness, cyanosis clubbing     Impression & Recommendations:  Problem # 1:  INTERSTITIAL LUNG DISEASE (ICD-515) The goal with a chronic steroid dependent illness is always arriving at the lowest effective dose that controls the disease/symptoms and not accepting a set "formula" which is based on statistics that don't take into accound individual variability or the natural hx of the dz in every individual patient, which may well vary over time.   Try new floor of 5 mg per day  Problem # 2:  COUGH (ICD-786.2)  Appears to have major component of Upper airway cough syndrome, so named because it's frequently impossible to sort out how much is LPR vs  CR/sinusitis with freq throat clearing generating secondary extra esophageal GERD from wide swings in gastric pressure that occur with throat clearing, promoting self use of mint and menthol lozenges that reduce the lower esophageal sphincter tone and exacerbate the problem further.  These symptoms are easily confused with asthma/copd by even experienced pulmonogists because they overlap so much. These are the same pts who not infrequently have failed to tolerate ace inhibitors,  dry powder inhalers or biphosphonates or report having reflux symptoms that don't respond to standard doses of PPI   This cough does not occur reproducibly with insp as  in most cases of ILD so is more c/w uacs ? worse off reglan.  for now will leave off p  Discussed in detail all the  indications, usual  risks and alternatives  relative to the benefits with patient   Orders: Est. Patient Level IV ([address])  Medications Added to Medication List This Visit: 1)  Reglan 10 Mg Tabs (Metoclopramide hcl) .... 1/2 tab by mouth  every morning and at bedtime- ran out 2)  Proair Hfa 108 (90 Base) Mcg/act Aers (Albuterol sulfate) .... Inhale 2 puffs every 4-6 hours as needed-ran out  Patient Instructions: 1)  Ok to resume water aerobics but pace yourself 2)  Wait until your swallowing evaulation is complete before making a decision re reglan.  3)  Try Prednisone 10 mg one-half each am 4)  See calendar for specific medication instructions and bring it back for each and every office visit for every healthcare provider you see.  Without it,  you may not receive the best quality medical care that we feel you deserve.

## 2010-11-17 ENCOUNTER — Ambulatory Visit (HOSPITAL_COMMUNITY): Payer: BC Managed Care – PPO

## 2010-11-23 ENCOUNTER — Encounter (INDEPENDENT_AMBULATORY_CARE_PROVIDER_SITE_OTHER): Payer: Self-pay | Admitting: *Deleted

## 2010-11-28 NOTE — Letter (Signed)
Summary: Results Follow-up Letter  [facility]  [address] [place], [place] [address]   Phone: [phone]  Fax: [phone]    11/23/2010  [address] [place], [place]  [address]  [place]  Dear Ms. Kolasinski,   The following are the results of your recent test done on 11/06/10. We have been trying to contact you on several occassions. Please at your next visit update your contact numbers that way we can get in contact with you concerning test results.  Test         Result     Bone Density         Osteopenia No changes to current medications. Please continue taking over-the-counter Calcium and Vitamin D.    Sincerely,  Orlan Leavens RMA/Dr. Rene Paci [facility]

## 2010-12-01 ENCOUNTER — Encounter: Payer: Self-pay | Admitting: Internal Medicine

## 2010-12-07 ENCOUNTER — Encounter: Payer: Self-pay | Admitting: Internal Medicine

## 2010-12-12 NOTE — Letter (Signed)
Summary: [facility]   Imported By: Kassie Mends 12/08/2010 09:53:33  _____________________________________________________________________  External Attachment:    Type:   Image     Comment:   External Document

## 2010-12-18 ENCOUNTER — Telehealth: Payer: Self-pay | Admitting: Internal Medicine

## 2010-12-19 ENCOUNTER — Encounter: Payer: Self-pay | Admitting: Internal Medicine

## 2010-12-19 ENCOUNTER — Ambulatory Visit (INDEPENDENT_AMBULATORY_CARE_PROVIDER_SITE_OTHER): Payer: BC Managed Care – PPO | Admitting: Internal Medicine

## 2010-12-19 VITALS — BP 134/84 | HR 76 | Temp 97.8°F | Wt 301.4 lb

## 2010-12-19 DIAGNOSIS — K219 Gastro-esophageal reflux disease without esophagitis: Secondary | ICD-10-CM

## 2010-12-19 DIAGNOSIS — J8410 Pulmonary fibrosis, unspecified: Secondary | ICD-10-CM

## 2010-12-19 NOTE — Patient Instructions (Signed)
See calendar for specific medication instructions and bring it back for each and every office visit for every healthcare provider you see.  Without it,  you may not receive the best quality medical care that we feel you deserve.  You will note that the calendar groups together  your maintenance  medications that are timed at particular times of the day.  Think of this as your checklist for what your doctor has instructed you to do until your next evaluation to see what benefit  there is  to staying on a consistent group of medications intended to keep you well.  The other group at the bottom is entirely up to you to use as you see fit  for specific symptoms that may arise between visits that require you to treat them on an as needed basis.  Think of this as your action plan or "what if" list.   Separating the top medications from the bottom group is fundamental to providing you adequate care going forward.   

## 2010-12-19 NOTE — Progress Notes (Signed)
Subjective:    Patient ID: [name], female    DOB: 01/16/1971, 40 y.o.   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  Since onset waxing/waning sx requiring intermittent prednisone tapers with only minimal improvement - most of her symptoms chronically have been related to obesity with dyspnea on exertion and tendency to chronic cough which is felt to be at least partly  reflux related.     10/9/9  ov  maintained on 10 mg/day without flare of cough or sob.   Feb 17, 2009 better until 2 weeks ago when tapered prednisone to   10-5 -10 >  more sob, eyes running and swollen sneezing not responding flonase or clariton  rec 10 mg 2 until better then 2-1-2 and flared with cough on that dose so resumed 2 daily but still cough with deep breath. rec restart 20/day  June 10, 2009 on 20 mg no cough, worried about the ragweed season minimal symptoms. using med calendar well. rec  try 20 a/w 10  07/22/09 doing fine so try floor 10 mg per day, ceiling of 20mg  per day.  August 22, 2009 Followup per Dr Shelle Iron.  Pt was seen here by [facility] on 11/19  for cough rx prednisone burst and add back reglan but just at hs..  Pt states that her cough is worse.  Cough is prod with yellow sputum.  Pt states that her voice "comes and goes".  Breathing is better when she is not coughing. She is taking 40 mg of prednisone daily.  --Tx w/ Levaquin, pred burst, reglan and pepcid added.   September 27, 2009 ov all better on prednisone 10mg  and back to baseline ex tolerance, cough control.   November 08, 2009 ov loosing wt voluntarily, no cough, baseline doe on 02 - rec taper reglan, keep up with med calendar  see page 2 June 22, 2010 Followup.  Pt states that she has been doing well. He has not had any trouble with cough or with SOB.  She does c/o some sinus pressure/drainage x 3 days. not using med calendar or amlodipine or advair. Not using 02 with  ex, not able to loose wt.  >>decrease pred 10 and 5    August 11, 2010 --Presents for follow up and med review. We reviewed her meds and updated her med calendar. Feels over last week her breathing not quite as good. She  has increased her prednisone to 10mg  once daily. Was not able to decrease steroids to 1/2 every other day. no discolred mucus, fever. Denies chest pain, dyspnea, orthopnea, hemoptysis, fever, n/v/d, edema, headache. Requests refills today. Under some stress. Father recently passed. Strained relationship with mother.  rec no change in rx  November 07, 2010 ov cc cough/ strangling  a bit more with taper off reglan (actually worse before ran out of the low dose reglan)  no excess mucus or increase sob on prednisone @  10 mg daily .  Using med calendar well rec 1)  Ok to resume water aerobics but pace yourself 2)  Wait until your swallowing evaulation is complete before making a decision re reglan > stopped it on her own.  3)  Try Prednisone 10 mg one-half each am   12/19/2010  Ov off reglan now  cc sob worse on 5 mg per day with lots of joint aches so increased back to 10 mg >  walking [employer] s 02 and cough ok control  as well with no noct symptoms.  Pt denies any significant sore throat, dysphagia, itching, sneezing,  nasal congestion or excess/ purulent secretions,  fever, chills, sweats, unintended wt loss, pleuritic or exertional cp, hempoptysis, orthopnea pnd or leg swelling.    Also denies any obvious fluctuation of symptoms with weather or environmental changes or other aggravating or alleviating factors.      Past Medical History: GASTROESOPHAGEAL REFLUX DISEASE     - Taper reglan to 10 mg one half twice daily June 22, 2010 > d/c'd 11/2010  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)   -Restart Prednisone 02/24/08   -PFT's December 23, 2008 VC 35%  DLC0 31%   -Desats with > 2  laps 06/22/10 so rec wear 02 2lpm at bedtime and with ex  MORBID OBESITY   - Target wt  =  153  for BMI < 30  Hypertension  Health Maintenance..........................................................Marland KitchenMarland KitchenFelicity Coyer    - Pneumovax July 22, 2009     -Complex med review August 29, 2009  Review of Systems     Objective:   Physical Exam Massively obese pleasant amb bf nad  wt  305 May 28, 2008 > 305 July 09, 2008 > 305 November 16, 2008    >  302 November 07, 2010 > 301  12/19/2010  HEENT: nl dentition, turbinates, and orophanx. Nl external ear canals without cough reflex Neck without JVD/Nodes/TM Lungs distant bs, coarse BS w/ no wheezing / no cough on insp RRR no s3 or murmur or increase in P2. no edema Abd soft and benign with nl excursion in the supine position. Ext warm without calf tenderness, cyanosis clubbing       Assessment & Plan:

## 2010-12-19 NOTE — Letter (Signed)
Summary: [facility]  [facility]   Imported By: Lennie Odor 12/14/2010 11:28:42  _____________________________________________________________________  External Attachment:    Type:   Image     Comment:   External Document

## 2010-12-28 NOTE — Progress Notes (Signed)
Summary: Rx refill req  Phone Note Refill Request Message from:  Patient on December 18, 2010 2:53 PM  Refills Requested: Medication #1:  DETROL LA 2 MG XR24H-CAP 1 by mouth once daily   Dosage confirmed as above?Dosage Confirmed   Supply Requested: 6 months   Notes: fax [phone] Pt is requesting Rx be faxed to new Mail Order pharmacy    Method Requested: Fax to Mail Away Pharmacy Initial call taken by: [name] Pyle, CMA,  December 18, 2010 2:54 PM  Follow-up for Phone Call        Rx faxed to Mail order pharmacy  Follow-up by: [name] Pyle, CMA,  December 18, 2010 3:02 PM    Prescriptions: DETROL LA 2 MG XR24H-CAP (TOLTERODINE TARTRATE) 1 by mouth once daily  #90 x 3   Entered by:   [name] Pyle, CMA   Authorized by:   Newt Lukes MD   Signed by:   [name] Pyle, CMA on 12/18/2010   Method used:   Printed then faxed to ...       [facility] (retail)       [address]       [place], [place]  [address]       Ph: [phone]       Fax: [phone]   RxID:   [id number]

## 2011-01-01 ENCOUNTER — Other Ambulatory Visit: Payer: Self-pay

## 2011-01-01 MED ORDER — TOLTERODINE TARTRATE 2 MG PO TABS: 2.0000 mg | ORAL_TABLET | Freq: Every day | ORAL | Status: DC

## 2011-01-15 ENCOUNTER — Telehealth: Payer: Self-pay | Admitting: *Deleted

## 2011-01-15 ENCOUNTER — Telehealth: Payer: Self-pay | Admitting: Internal Medicine

## 2011-01-15 MED ORDER — ESOMEPRAZOLE MAGNESIUM 40 MG PO CPDR: DELAYED_RELEASE_CAPSULE | ORAL | Status: DC

## 2011-01-15 MED ORDER — FAMOTIDINE 20 MG PO TABS: ORAL_TABLET | ORAL | Status: DC

## 2011-01-15 MED ORDER — FLUTICASONE PROPIONATE 50 MCG/ACT NA SUSP: NASAL | Status: DC

## 2011-01-15 MED ORDER — PREDNISONE 10 MG PO TABS: ORAL_TABLET | ORAL | Status: DC

## 2011-01-15 NOTE — Telephone Encounter (Signed)
Patient requesting a call back - c/o fatigue off and on and congestion. Wants to know if she needs ov.

## 2011-01-15 NOTE — Telephone Encounter (Signed)
Spoke w/ pt and she states her insurance has changed and she now needs rx for her nexium, prednisone, fluticason, and pepcid sent to mail order. Confirmed mail order w/ pt and advised her rx's was sent.  Nothing further was needed

## 2011-01-15 NOTE — Telephone Encounter (Signed)
Called pt no answer LMOM will need ov to discuss symtoms with md. Pls call abck to schedule appt...01/15/1223:24pm/LMB

## 2011-02-09 ENCOUNTER — Encounter: Payer: Self-pay | Admitting: Internal Medicine

## 2011-02-10 ENCOUNTER — Encounter: Payer: Self-pay | Admitting: Internal Medicine

## 2011-02-10 ENCOUNTER — Ambulatory Visit (INDEPENDENT_AMBULATORY_CARE_PROVIDER_SITE_OTHER): Payer: BC Managed Care – PPO | Admitting: Internal Medicine

## 2011-02-10 VITALS — BP 130/88 | HR 96 | Temp 98.3°F | Ht 61.0 in | Wt 301.5 lb

## 2011-02-10 DIAGNOSIS — J019 Acute sinusitis, unspecified: Secondary | ICD-10-CM

## 2011-02-10 MED ORDER — TRAMADOL HCL 50 MG PO TABS: 50.0000 mg | ORAL_TABLET | Freq: Three times a day (TID) | ORAL | Status: DC | PRN

## 2011-02-10 MED ORDER — AZITHROMYCIN 250 MG PO TABS: 250.0000 mg | ORAL_TABLET | Freq: Every day | ORAL | Status: AC

## 2011-02-10 NOTE — Progress Notes (Signed)
Subjective:    Patient ID: [name], female    DOB: 07/07/1971, 40 y.o.   MRN: [id number]  Cough Associated symptoms include headaches.  Headache  Associated symptoms include coughing.  Sore Throat  Associated symptoms include coughing and headaches.   Having bad cough Ears are stuffed and will pop when she moves her jaw Feels drainage with "tickle" Gets SOB with cough, chest feels tight Productive cough with yellow and occ orange looking sputum (not clearly bloody)  Has chronic sinus and allergy problems Did have sig allergy symptoms in past couple of weeks---eyes better but nose, etc worse now  No fever Some sore throat---esp in AM. Then gets better  Current outpatient prescriptions:acetaminophen-codeine (TYLENOL #4) 300-60 MG per tablet, 1 to 2 tablets every 4 to 6 hours as needed , Disp: , Rfl: ;  Aspirin-Acetaminophen-Caffeine (EXCEDRIN MIGRAINE PO), OTC as directed , Disp: , Rfl: ;  calcium-vitamin D (OSCAL 500/200 D-3) 500-200 MG-UNIT per tablet, Take 1 tablet by mouth 2 (two) times daily.  , Disp: , Rfl:  dextromethorphan-guaiFENesin (MUCINEX DM) 30-600 MG per 12 hr tablet, Take 1 tablet by mouth every 12 (twelve) hours. As needed , Disp: , Rfl: ;  esomeprazole (NEXIUM) 40 MG capsule, 1 capsule 30 minutes before first and last meals daily, Disp: 180 capsule, Rfl: 3;  famotidine (PEPCID AC MAXIMUM STRENGTH) 20 MG tablet, 1 at bedtime , Disp: , Rfl: ;  famotidine (PEPCID AC MAXIMUM STRENGTH) 20 MG tablet, One at bedtime, Disp: 90 tablet, Rfl: 3 fluticasone (FLONASE) 50 MCG/ACT nasal spray, 2 sprays each nostril in the morning and at bedtime, Disp: 16 g, Rfl: 3;  FLUTICASONE PROPIONATE, NASAL, NA, 2 sprays each nostril twice daily as needed, Disp: , Rfl: ;  Loratadine 10 MG CAPS, 1 daily as needed , Disp: , Rfl: ;  predniSONE (DELTASONE) 10 MG tablet, 1 every am, Disp: 90 tablet, Rfl: 3 tolterodine (DETROL) 2 MG tablet, Take 1 tablet (2 mg total) by mouth daily. 1 tablet daily,  Disp: 90 tablet, Rfl: 2;  albuterol (PROAIR HFA) 108 (90 BASE) MCG/ACT inhaler, Inhale 1 to 2 puffs every 4 to 6 hours as needed , Disp: , Rfl: ;  Cholecalciferol (VITAMIN D) 1000 UNITS capsule, 1 tablet daily , Disp: , Rfl:   Past Medical History  Diagnosis Date  . Dysphagia   . Allergic rhinitis   . Unspecified essential hypertension   . Mild depression   . Headache   . Urge incontinence   . Chronic rhinitis   . Dyspnea   . GERD (gastroesophageal reflux disease)   . Cough   . Bronchitis   . ILD (interstitial lung disease)   . Morbid obesity     Past Surgical History  Procedure Date  . Bilateral vats ablation 02/15/04    Family History  Problem Relation Age of Onset  . Sarcoidosis Mother     dxed by transbrochial bx  . Allergies Mother   . Heart disease Father   . Diabetes Father   . Allergies Father   . Coronary artery disease Father   . Cancer Maternal Grandmother     CA of unknown type  . Cancer Paternal Grandmother     CA of unknown type    History   Social History  . Marital Status: Married    Spouse Name: N/A    Number of Children: 1  . Years of Education: N/A   Occupational History  . 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 Smoking status: Never Smoker   . 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 Alcohol Use: No  . Drug Use: No  . Sexually Active: Not on file   Other Topics Concern  . Not on file   Social History Narrative  . No narrative on file     Review of Systems  Respiratory: Positive for cough.   Neurological: Positive for headaches.   Having trouble sleeping due to this--does better sitting almost straight up Some vomiting----only post-tussive. Some nausea she relates to taking tylenol Eating okay    Objective:   Physical Exam  Constitutional: She appears well-developed  and well-nourished.       Upset and tearful at times Mild dyspnea --esp when getting up on table  HENT:  Mouth/Throat: Oropharynx is clear and moist. No oropharyngeal exudate.       Mild maxillary>frontal tenderness Moderate nasal inflammation  Neck: Normal range of motion.  Pulmonary/Chest: She has no wheezes. She has no rales.       Decreased breath sounds but clear  Lymphadenopathy:    She has no cervical adenopathy.          Assessment & Plan:

## 2011-02-12 ENCOUNTER — Telehealth: Payer: Self-pay

## 2011-02-12 NOTE — Telephone Encounter (Signed)
[facility] Triage Call Report Triage Record Num: [id number] Operator: Revonda Humphrey Patient Name: [facility] Call Date & Time: 02/11/2011 5:16:56PM Patient Phone: [phone] PCP: Kerby Nora Patient Gender: Female PCP Fax : [phone] Patient DOB: August 14, 1971 Practice Name: [facility] Reason for Call: Patient started ZPak and Tramadol for cough after OV yesterday 5/12. Noting nausea first after taking Tramadol for cough yesterday and finally went to sleep. Lot of cough again today so took Tramadol again for cough and soon afterward nausea started again and had continued. Has been able to eat but still feeling nauseous. Tramadol helped cough. Gave care information for nausea, requesting Phenergan which helped before with nausea, vomiting,. Called Phenergan 25mg  tabs #6, one tab Q4Hrs prn, No Refill to [facility] [phone]. Gave care information for nausea, will follow up in office in am. Protocol(s) Used: Nausea or Vomiting Recommended Outcome per Protocol: Call Provider within 24 Hours Reason for Outcome: Symptoms began after starting or changing dose of prescription, nonprescription, alternative medication, or illicit drug Care Advice: Nausea Care Advice: - Drink small amounts of clear, sweetened liquids or ice cold drinks. - Eat light, bland foods such as saltine crackers or plain bread. - Do not eat high fat, highly seasoned, high fiber, or high sugar content foods. - Avoid mixing hot food and cold foods. - Eat smaller, more frequent meals. - Rest as much as possible in a sitting or in a propped lying position. Do not lie flat for at least 2 hours after eating. - Do not take pain medication (such as aspirin, NSAIDs) while nauseated. - Rest as much as possible until symptoms improve since activity may worsen nausea. ~ 02/11/2011 5:43:03PM Page 1 of 1 [email or web address]

## 2011-02-13 NOTE — Assessment & Plan Note (Signed)
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  Her chest x-ray did not show any definite evidence  of recurrent interstitial lung disease, although she had quite small  lung volumes.  She was treated for what I presume was either nonspecific  rhinitis/tracheal bronchitis or reflux-related cough but comes back  today reporting that she is no better and now coughing up brown thick  mucus (previously described the cough as dry).   She denies any fever or pleuritic pain.  She denies any significant  increased dyspnea over baseline.   Presently she is maintained just on Nexium 40 mg before breakfast.   PHYSICAL EXAMINATION:  GENERAL:  She is a pleasant ambulatory black  female who actually appears much better than previous evaluation, no  longer having coughing paroxysms that can be heard across the office.  VITAL SIGNS:  Afebrile with stable vital signs.  HEENT:  Unremarkable.  Oropharynx clear.  LUNGS:  Lung fields are clear bilaterally to auscultation and  percussion, although breath sounds are distant with inspiratory and  expiratory and quite short.  CARDIAC:  Regular rate and rhythm without murmurs, rubs, or gallops.  ABDOMEN:  Soft.  EXTREMITIES:  Warm without calf tenderness, cyanosis, clubbing, or  edema.   Hemoglobin saturation is 100% on room air today.   Lab studies reviewed from her previous visit on June 19, 2007 and  include a normal CBC, normal sedimentation rate, chemistry profile, BNP,  and TSH.   IMPRESSION:  Tracheal bronchitis with purulent  sputum.  This is a  completely different history than I obtained on the first visit and  suggests the possibility of sinus disease.  I have recommended 10 days  of Augmentin and switch from Delsym to Mucinex DM 2 twice daily as  needed for cough and congestion.  I emphasized the Tylenol No. 4 can be  used as needed for excessive cough and that she should follow the  gastroesophageal reflux disease diet that she was previously given  because she is at high risk of gastroesophageal reflux disease based on  obesity and the severity of her cough previously strongly suggested a  gastroesophageal reflux disease component.   I did add also Reglan empirically 10 mg before meals and at bedtime and  arranged for her to see our nurse practitioner back here in 2 weeks for  full medication reconciliation purposes.     Charlaine Dalton. Sherene Sires, MD, [facility]  Electronically Signed    MBW/MedQ  DD: 06/26/2007  DT: 06/27/2007  Job #: [id number]   cc:   Ernestina Penna, M.D.

## 2011-02-13 NOTE — Assessment & Plan Note (Signed)
[name]                             PULMONARY OFFICE NOTE   NAME:[name], [name]                         MRN:          [id number]  DATE:06/19/2007                            DOB:          [birth year]    HISTORY:  A very complicated 40 year old black female with documented  nonspecific interstitial pneumonia (typically steroid responsive), last  seen here on October 31st, with residual pulmonary fibrosis but symptoms  that suggested to me more of an upper airway than lower airway problem.  I suspected that she had a component of reflux and recommended she be  treated with Nexium.  She stated that she was better for a while but  eventually tapered herself off of all of her medicines and has not been  on anything for months.  Several weeks ago she began to notice an  increasing dyspnea associated with a dry cough and hoarseness and now is  coughing violently to the point where she feels she is loosing her  breath and choking during severe coughing paroxysms.  She denies any  pleuritic pain, fevers, chills, sweats, orthopnea, PND, or leg swelling,  although says the cough is worse when she lays down.   PAST MEDICAL HISTORY:  1. Pulmonary fibrosis.  2. Morbid obesity.  3. Complicated by hypertension.   ALLERGIES:  None known.   MEDICATIONS:  None regularly.  She did not even use the over-the-counter  medicines that were no her previous p.r.n. list which she says, she  cannot find since moved.   SOCIAL HISTORY:  She has never smoked.   FAMILY HISTORY:  Significant in that her mother has sarcoid.   REVIEW OF SYSTEMS:  Taken in detail and negative except as outlined  above.   PHYSICAL EXAMINATION:  GENERAL:  This is an obese black female in no  acute distress.  VITAL SIGNS:  Her weight is 276 pounds which is down 6 pounds from a  previous visit in October 2007.  Blood pressure is 118/80.  HEENT:  Remarkable for severe hoarseness on phonation.   However,  oropharynx is clear with no evidence of excessive postnasal drainage or  cobblestoning.  Dentition is intact.  NECK:  Supple without cervical adenopathy or tenderness.  Trachea is  midline.  No thyromegaly.  LUNGS:  Lungs fields with relatively short inspiratory and expiratory  time with no cough on inspiratory or expiratory maneuvers.  HEART:  Regular rhythm without murmur, gallop, or rub.  S1 S2 were  diminished.  There was no increase in P2.  ABDOMEN:  Massively obese, otherwise benign.  EXTREMITIES:  Warm without calf tenderness, cyanosis, clubbing, edema.   Heme saturation 94% on room air.   Chest x-ray shows decreased lung volumes with increased markings but  actually is no change from previous studies from 2007.   IMPRESSION:  Recurrent upper airway cough syndrome, probably related to  obesity with reflux.  I recommended restarting Nexium 40 mg tablets  taken 30-60 minutes before her first meal and for cough use Delsym  supplement and Tylenol #4 p.r.n.  I also gave  her a 6-day course of  prednisone with the recommendation that she return in 2 weeks for  followup with our nurse practitioner to determine whether she needs long  term therapy or not (I would have a very low threshold to add Reglan to  her regimen).   I did review all these issues with her in writing and also a GERD diet  noting that she has been using over-the-counter lozenges containing both  mint and menthol which I have asked her to avoid.     [name]. Jessica Sires, MD, [facility]  Electronically Signed    MBW/MedQ  DD: 06/19/2007  DT: 06/19/2007  Job #: [id number]   cc:   [name], M.D.

## 2011-02-16 NOTE — Assessment & Plan Note (Signed)
[facility]                               PULMONARY OFFICE NOTE   NAME:Adkins, Jessica YIN                         MRN:          [id number]  DATE:07/31/2006                            DOB:          09-25-1971    HISTORY:  Very complicated 40 year old black female with documented,  nonspecific interstitial  pneumonitis in May 2005 which proved to be  partially steroid responsive although left her with significant chronic  dyspnea on exertion.  When I last saw her, her incentive spirometry  indicated she was able to get up to about a vital capacity of 1750 cc, but  was lost to followup subsequently and tapered off all her medications.  She  comes in now with worsening dyspnea over the last several months with  activity associated with a dry cough when she takes a deep breath.  She  denies any pleuritic pain, fevers, chills, sweats, myalgias, arthralgias,  orthopnea, PND, or leg swelling, or overt heartburn.   MEDICATIONS:  She is on no regular medicines.   PHYSICAL EXAMINATION:  GENERAL:  Massively obese black female who weighs 2  which is actually down from 315 when we last saw her here a year and a half  ago.  HEENT:  Unremarkable.  Oropharynx is clear.  CHEST:  Lung fields reveal minimal crackles in the bases with diminished air  movement.  There are coughing paroxysms on inspiration.  HEART:  Regular rhythm without murmur, gallop or rub.  ABDOMEN:  Soft, benign.  EXTREMITIES:  Warm without calf tenderness, cyanosis or clubbing or edema.   Chest x-ray reveals decreased lung volumes with minimal increased markings  at the bases.   IMPRESSION:  Pulmonary fibrosis with decreased lung volumes and coughing on  inspiration.  I am not actually confident that there is anything active  going on here other than that the patient is more physically active.  Every  time she takes a deep breath, she tends to cough, typical of a patient with  interstitial lung  disease.  I attempted to ask the question in multiple  different ways as to whether she was actually pushing herself harder than  baseline or whether she was having new symptoms at the same level of  activity that she previously was able to tolerate, but I really got nowhere  with her in terms of insight into exercise and exercise tolerance.   The record does indicate that she previously clinically had GERD and I am  going to recommend that, therefore, she be restarted on Nexium since it may  be the cause of some of her coughing and perhaps contributing to airways and  interstitial disease as well.  For now I am going to recommend Nexium 40 mg  each and every morning before meals and remind her regarding dietary  restrictions (flyer reviewed).  I plan to work her up with CBC with  differential, sed rate and BMP to be completed, and see her back in four  weeks to see if she is improved.  In  the meantime I encourage  her to find the incentive spirometry she had  previously so she can begin using it more regularly.  If not, we can  certainly obtain one for her on next visit.    ______________________________  Charlaine Dalton. Sherene Sires, MD, [facility]    MBW/MedQ  DD: 07/31/2006  DT: 08/01/2006  Job #: [id number]   cc:   Ernestina Penna, M.D.

## 2011-02-16 NOTE — Op Note (Signed)
NAME:  [name], [name]                            ACCOUNT NO.:  [id number]   MEDICAL RECORD NO.:  [id number]                   PATIENT TYPE:  AMB   LOCATION:  CARD                                 FACILITY:  [facility]   PHYSICIAN:  Casimiro Needle B. Sherene Sires, M.D. [facility]           DATE OF BIRTH:  06-Feb-1971   DATE OF PROCEDURE:  01/17/2004  DATE OF DISCHARGE:                                 OPERATIVE REPORT   REFERRING PHYSICIAN:  Self-referred.   PROCEDURE:  Fiberoptic bronchoscopy with a transbronchial biopsy of the left  lower lobe.   HISTORY AND INDICATIONS:  Please see dictated office records on this 40-year-  old black female with diffuse infiltrates on chest x-ray and a family  history of sarcoid.  The differential diagnosis is sarcoid versus BOOP based  on elevated sed rate, but note that she had a normal ACE level at baseline.   There has been no change on the exam.  The only part of the history that is  different is that a Depo-Medrol injection was given within the last week  transiently and improved her quite a bit in terms of her cough and  dyspnea.   The patient was performed in the bronchoscopy suite with continuous  monitoring by surface ECG and oximetry.  She maintained adequate saturation  throughout the procedure in sinus rhythm.   She was premedicated with a total of 50 mg of IV Demerol and a total of 7.5  mg of IV Versed for adequate sedation and cough suppression and also treated  with topical 1% lidocaine by updraft nebulizer.   Using a standard flexible fiberoptic bronchoscope, the right naris was  easily cannulated with good visualization of the entire oropharynx and  larynx.  The cords moved normally, and there were no apparent upper airway  lesions.   Using additional 1% lidocaine as needed, the entire tracheobronchial tree  was explored bilaterally with the following findings.   The airways were completely clear of any evidence of mucosal abnormality to  the  subsegmental level bilaterally.  That is there were no increased  secretions.  There was no erythema, and there was no cobblestoning.   DESCRIPTION OF PROCEDURE:  Using a wedge position within the left lower lobe  basal segments, multiple transbronchial biopsies were obtained with adequate  tissue obtained, minimal bleeding, and no evidence of pneumothorax.  Lingula  was also lavaged for AFB and fungal stain and culture and cytology.  A  follow-up chest x-ray is pending at the time of this dictation.   IMPRESSION:  Diffuse pulmonary infiltrates of unclear etiology.  Differential diagnosis sarcoid versus BOOP.   RECOMMENDATIONS:  Solu-Medrol 125 mg IV now and then start prednisone 20 mg  daily awaiting biopsy report.  Charlaine Dalton. Sherene Sires, M.D. [facility]   MBW/MEDQ  D:  01/17/2004  T:  01/17/2004  Job:  [id number]

## 2011-02-16 NOTE — Op Note (Signed)
[name], [name]                            ACCOUNT NO.:  [id number]   MEDICAL RECORD NO.:  [id number]                   PATIENT TYPE:  INP   LOCATION:  3311                                 FACILITY:  [facility]   PHYSICIAN:  Ines Bloomer, M.D.              DATE OF BIRTH:  December 06, 1970   DATE OF PROCEDURE:  02/15/2004  DATE OF DISCHARGE:                                 OPERATIVE REPORT   PREOPERATIVE DIAGNOSES:  1. Left lung bilateral pulmonary infiltrates.  2. Morbid obesity.   POSTOPERATIVE DIAGNOSES:  1. Left lung bilateral pulmonary infiltrates.  2. Morbid obesity.   OPERATION PERFORMED:  Left thoracotomy, wedge resection, lung biopsy x3.   SURGEON:  Ines Bloomer, M.D.   FIRST ASSISTANT:  Ms. Coral Ceo, P.A.-C.   DESCRIPTION OF PROCEDURE:  After percutaneous insertion of all monitor  lines, the patient underwent general anesthesia, was prepped and draped in  the usual sterile manner, turned to the left lateral thoracotomy position.  Because of her morbid obesity, a dual lumen tube could not be inserted, so  she a single lumen tube was inserted.  A 10 cm incision was made over the  sixth intercostal space at the anterior axillary line to the mid axillary  line, and dissection was carried down through the subcutaneous tissue to the  platysmas which was partially divided.  Then the serratus was opened.  A  _____________placed in the interspace.  A ____________balloon to grab the  lingula which was resected with two passes of the EZ 45 stapler, and then  the medial basilar segment of the left lower lobe was biopsied with two  passes of the EZ 45 stapler, and then lateral basilar segment of the left  lower lobe was also biopsied with three passes of the EZ 45 stapler.  Frozen  section revealed acute inflammatory process.  Two chest tubes were brought  in through separate stab wounds.  A _________was applied to all three staple  lines using the application to seal the  staple lines.  The chest was closed  with two pericostal's, #1 Vicryl in the muscle layer, 2-0 Vicryl in the  subcutaneous tissue, and Dermabond for the skin.  The patient tolerated the  procedure well, was returned to the recovery room in stable condition.                                               Ines Bloomer, M.D.    DPB/MEDQ  D:  02/15/2004  T:  02/15/2004  Job:  [id number]   cc:   Charlaine Dalton. Sherene Sires, M.D. [facility]

## 2011-02-16 NOTE — Discharge Summary (Signed)
NAMEVERL, WHITMORE                            ACCOUNT NO.:  [id number]   MEDICAL RECORD NO.:  [id number]                   PATIENT TYPE:  INP   LOCATION:  3311                                 FACILITY:  [facility]   PHYSICIAN:  Ines Bloomer, M.D.              DATE OF BIRTH:  1971-05-16   DATE OF ADMISSION:  02/11/2004  DATE OF DISCHARGE:  02/20/2004                                 DISCHARGE SUMMARY   ADMISSION DIAGNOSIS:  Pulmonary fibrosis of unknown etiology.   ADDITIONAL/DISCHARGE DIAGNOSES:  1. Bilateral pulmonary infiltrates of questionable etiology, status post     left mini-thoracotomy with left upper lobe and left lower lobe biopsies     completed on Feb 15, 2004.  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  2. History of hysterectomy.  3. Hypertension.  4. History of cesarean section.  5. Transient hyperglycemia likely secondary to prednisone therapy.   HOSPITAL MANAGEMENT/PROCEDURES:  1. Steroid therapy for pulmonary fibrosis.  2. Left mini-thoracotomy with left upper lobe and left lower lobe biopsies     x3 and wedge resection completed by Dr. Ines Bloomer on Feb 15, 2004.  3. Two-dimensional echocardiogram completed on Feb 17, 2004.   CONSULTATIONS:  1. Care Management.  2. Mobility team.   HISTORY OF PRESENT ILLNESS:  Mrs. Dettmann is a 40 year old black female who  developed an indolent onset of a dry cough in March with the initial  diagnosis of pneumonia.  The patient was treated with prednisone and a short  course of Zithromax and only improved slightly.  The patient has been  followed over the past few months by Dr. Casimiro Needle B. Wert, who completed a  bronchoscopy on January 17, 2004.  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  She also  underwent a CT scan which showed diffuse infiltrates and ground-glass  changes.  The patient was placed on oral prednisone  therapy and was given  the option of an open lung biopsy.  She agreed to be seen by Dr. Edwyna Shell and  undergo lung biopsy for definitive diagnosis.   While arranging for the patient to see Dr. Edwyna Shell as an outpatient, the  patient called Dr. Sherene Sires and stated that with tapering of the prednisone, she  had noticed significant increase in dyspnea to the point of shortness of  breath at rest.  The patient also had severe paroxysms of coughing which  included violent dry coughing which occurred when she tried to take a deep  breath.  She stated that these had worsened, despite treatment including  codeine to suppress the excessive cough.  It was decided then by Dr. Sherene Sires to  admit the patient to [facility] for anticipated lung biopsy to be  completed by Dr. Edwyna Shell.   HOSPITAL COURSE:  Mrs. Jacquez was then admitted to [facility] on Feb 11, 2004 with pulmonary fibrosis of questionable etiology.  She was  initiated on an aggressive prednisone regimen and treated appropriately for  her pulmonary issues.  The patient was seen in consultation then by Dr.  Edwyna Shell on Feb 14, 2004, at which time he discussed the risks, benefits and  alternatives to proceeding with mini-thoracotomy with lung biopsy.  The  patient was in understanding of these risks and agreed to proceed with  surgery.   The patient was then taken to the operating room on Feb 15, 2004 and  underwent left mini-thoracotomy with left upper lobe and left lower lobe  biopsies x3 and a small wedge resection.  The patient tolerated this  procedure well and was extubated on the operating room table.  The patient  was then transferred to the postanesthesia care unit in stable condition.  Once awake, alert and appropriate, the patient was then transferred to 3300,  or the intermediate care unit.   Over the next several hospital days, the patient remained stable and made  steady postoperative progress.  Once her chest tubes showed  minimal output  and no evidence of air leak, they were discontinued in routine fashion.  The  patient had significant postoperative hyperglycemia secondary to prednisone  therapy.  She was treated with insulin in the short-term for better control.   On postop day #4, or Feb 19, 2004, the patient was deemed appropriate for  initiation of discharge planning.  She had fairly good O2 saturations on  room air, although she would desaturate with activity.  Therefore, home  oxygen therapy was ordered and made available for the time of discharge.  Chest x-ray from Feb 18, 2004 showed no evidence of pneumothorax and  persistent bibasilar opacities with some increase in interstitial  prominence.  There was no definite pleural effusion.  The patient was able  to ambulate with 1 assistant.  She was tolerating a regular diet.  She has  resumed normal bowel and bladder function.  Her pain was well-controlled  with oral medications.  Her left mini-thoracotomy site was healing well  without evidence of infection.   Preliminary surgical pathology showed mild inflammation and fibrosis and no  malignancy identified.  The lung tissue had evidence of mild inflammation  and patchy pneumocyte hyperplasia with fibrosis as well.  This feature is  consistent with organizing pneumonia and raises the possibility of  bronchiolitis obliterans.  Preliminary cytology reads no malignant cells  identified.  The final biopsy results are still pending at the time of this  dictation.   We will continue as planned with discharge to home on Feb 20, 2004, pending  a.m. rounds and no change in the patient's clinical status.   DISCHARGE MEDICATIONS:  1. Zelnorm 6 mg twice daily with breakfast and dinner.  2. Protonix 40 mg twice daily.  3. Singulair 10 mg daily.  4. Clonidine 0.1 mg twice daily.  5. Prednisone taper as follows:  40 mg daily for 3 days, then decrease to 20    mg daily for 3 days, then decrease to 10 mg daily  for 3 days, then     discontinue.  6. Amaryl 2 mg daily for 9 days, then discontinue.  7. Darvocet-N 100 one to two tablets every 4-6 hours as needed for pain.   DISCHARGE INSTRUCTIONS:  1. Activity:  The patient should avoid driving.  She should avoid heavy     lifting or strenuous activity.  She should  continue to walk daily.  2. Diet:  The patient has no restrictions, although she should follow a     constant low-to-moderate carbohydrate diet while on prednisone therapy.  3. Wound care:  The patient may shower.  She should wash her incisions daily     with soap and water.  She should notify the CVTS office if she has any     increased redness, swelling or drainage from her incision sites.   SPECIAL INSTRUCTIONS:  The patient is to check her blood sugar daily (first  thing in the morning) and notify Dr. Thurston Hole office if it is greater than  250.   FOLLOWUP APPOINTMENT:  1. The patient is to see Dr. Edwyna Shell within 7-10 days of discharge.  She is     to obtain a chest x-ray at [facility] prior to this     appointment.  The CVTS office will call the patient with this exact     appointment date and time.  2. The patient is to follow up with Dr. Sherene Sires within 2 weeks of discharge.     Dr. Thurston Hole office will call the patient specifically with her final     biopsy results and any plans for a specific date and time for followup.      Carolyn A. Eustaquio Boyden.                  Ines Bloomer, M.D.    CAF/MEDQ  D:  02/19/2004  T:  02/21/2004  Job:  [id number]   cc:   Ines Bloomer, M.D.  [address]  [place]  [place] [address]   Charlaine Dalton. Sherene Sires, M.D. [facility]

## 2011-02-16 NOTE — H&P (Signed)
NAME:  [name], CREEDON                            ACCOUNT NO.:  [id number]   MEDICAL RECORD NO.:  [id number]                   PATIENT TYPE:  INP   LOCATION:  5504                                 FACILITY:  [facility]   PHYSICIAN:  Charlaine Dalton. Sherene Sires, M.D. [facility]           DATE OF BIRTH:  [birth year]   DATE OF ADMISSION:  02/11/2004  DATE OF DISCHARGE:                                HISTORY & PHYSICAL   REFERRING PHYSICIAN:  Dr. __________  .   CHIEF COMPLAINT:  Dyspnea and cough.   HISTORY:  This is a 40 year old black female, never a smoker, who developed  the indolent onset of a dry cough in early March, associated with chest  heaviness, when she would take a deep breath and was seen in the emergency  room initially with a diagnosis of pneumonia.  She was treated with  prednisone and a short course of Zithromax and felt only a little better.  She would cough so hard, she felt that she was going to blackout.  She had  mild sweats and has lost about 30 pounds over the last five months for  unknown reasons.  However, she denied any overt sinus symptoms, reflux  symptoms, myalgias, arthralgias, definite fever, typical pleuritic pain, or  unusual exposures.  I evaluated her initially, on January 11, 2004, and noted  that she had a family history of sarcoid, having diagnosed a chronic  pneumonia in her mother as actually sarcoid.  I, therefore, proceeded to  bronchoscopy.  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  I saw her  back in the office, and gave her the option of an open lung biopsy versus  empiric therapy on January 25, 2004.  She agreed to try 40 b.i.d.  I saw her  again, on Feb 09, 2004, in the office with the conclusion that she really  was not consistently better and continued to have severe coughing paroxysms,  despite 40 mg b.i.d.  prednisone, and empiric therapy directed at reflux, and  a chest x-ray that did not show convincing evidence of improvement.  Note  that her baseline sed rate was only in the 30s.  I, therefore, called into  question whether she truly had BOOP or not and recommended an open lung  biopsy and was arranging for her to see Dr. Edwyna Shell as an outpatient.  However, she called me today stating that, as she has tapered prednisone as  I instructed, she has already noticing significant increase in dyspnea to  the point where she is short of breath at rest.  She also has severe  paroxysms of coughing, violent dry coughing, that occur when she tries to  take a deep breath, and these have worsened also despite treatment including  codeine to suppress excess cough.   PAST MEDICAL HISTORY:  1. Hysterectomy.  2. Hypertension.  3. Remote C-section.   ALLERGIES:  PENICILLIN causes a rash.   She denies any aspirin intolerance.   MEDICATIONS:  1. Singulair 10 mg daily for her allergies.  2. Hydrochlorothiazide, unknown dose, one daily.  3. Prednisone which she has tapered now to 20 mg b.i.d.  4. Nexium 40 mg q.a.m.  5. Delsym 2 teaspoons b.i.d.  6. Ultram 50 mg q.i.d.  7. Tylenol #4 one q.4h. p.r.n. excess cough.   SOCIAL HISTORY:  She has never smoked.  She has worked as a Engineer, maintenance (IT).  She denies any unusual travel, pet, or hobby, or HIV exposure  history.   FAMILY HISTORY:  Reviewed with her in detail and noted for the fact that her  mother was sarcoid, otherwise no significant respiratory diseases in the  family or rheumatologic diseases.   REVIEW OF SYSTEMS:  Also taken in detail and negative except as outlined  above.  She denies any leg swelling, a history of DVT.   PHYSICAL EXAMINATION:  GENERAL:  This is an obese black female who is fairly  short of breath at rest.  However, she saturates in the mid 90s on room air.  When she tries to take a deep breath, she has severe coughing  paroxysms.  VITAL SIGNS:  She is afebrile.  HEENT:  Unremarkable.  Pharynx clear.  Dentition is intact.  Nasal  turbinates normal.  Ear canals are clear bilaterally.  NECK:  Supple without cervical adenopathy, tenderness.  Trachea is midline.  LUNGS:  Lung fields reveal acute crackles on inspiration with severe  coughing spasms as noted, and inspiratory and expiratory time however  relatively short.  HEART:  Regular rate and rhythm without murmur, gallop or increase in P2.  ABDOMEN:  Obese, benign with no palpable organomegaly, or masses,  tenderness.  EXTREMITIES:  Warm without calf tenderness, cyanosis, clubbing, or edema.  NEUROLOGIC:  No focal deficits, pathologic reflexes.  SKIN:  Warm and dry with no lesions.  MUSCULOSKELETAL:  Also completely normal.   LAB STUDIES:  Pending.   Chest x-ray, from Feb 09, 2004, showed bilateral ground glass opacities with  moderate reduction in lung volume.   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  I believe, if is,  therefore, time to move on to an open lung biopsy, and I have discussed with  Dr. Edwyna Shell proceeding with this as soon as possible, probably on Feb 15, 2004.  In the meantime, we will treat her with intravenous steroids,  continue to treat her with proton pump inhibitor therapy and suppress excess  coughing with both inhaled lidocaine, Ultram, and as needed Tylenol #4.  Chart review indicates that we have not yet checked a an human  immunodeficiency virus status on this patient, although she is not at risk,  and she had no evidence of Pneumocystis carinii pneumonia  by the bronchial biopsy, I did not specifically check for Pneumocystis carinii pneumonia and  this will need to be done as  well.  Charlaine Dalton. Sherene Sires, M.D. [facility]    MBW/MEDQ  D:  02/11/2004  T:  02/11/2004  Job:  [id number]

## 2011-03-22 ENCOUNTER — Encounter: Payer: Self-pay | Admitting: Internal Medicine

## 2011-03-22 ENCOUNTER — Ambulatory Visit (INDEPENDENT_AMBULATORY_CARE_PROVIDER_SITE_OTHER): Payer: BC Managed Care – PPO | Admitting: Internal Medicine

## 2011-03-22 DIAGNOSIS — J8410 Pulmonary fibrosis, unspecified: Secondary | ICD-10-CM

## 2011-03-22 DIAGNOSIS — K219 Gastro-esophageal reflux disease without esophagitis: Secondary | ICD-10-CM

## 2011-03-22 MED ORDER — METOCLOPRAMIDE HCL 10 MG PO TABS: ORAL_TABLET | ORAL | Status: DC

## 2011-03-22 NOTE — Patient Instructions (Signed)
Add  Back the reglan 10 mg one half before bast and bedtime.    See Tammy NP w/in 3 months  with all your medications, even over the counter meds, separated in two separate bags, the ones you take no matter what vs the ones you stop once you feel better and take only as needed when you feel you need them.   Tammy  will generate for you a new user friendly medication calendar that will put [place] all on the same page re: your medication use.     Without this process, it simply isn't possible to assure that we are providing  your outpatient care  with  the attention to detail we feel you deserve.   If we cannot assure that you're getting that kind of care,  then we cannot manage your problem effectively from this clinic.  Once you have seen Tammy and we are sure that we're all on the same page with your medication use she will arrange follow up with me.

## 2011-03-22 NOTE — Progress Notes (Signed)
Subjective:    Patient ID: [name], female    DOB: [birth year], 40 y.o.   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  Since onset waxing/waning sx requiring intermittent prednisone tapers with only minimal improvement - most of her symptoms chronically have been related to obesity with dyspnea on exertion and tendency to chronic cough which is felt to be at least partly  reflux related.     10/9/9  ov  maintained on 10 mg/day without flare of cough or sob.   Feb 17, 2009 better until 2 weeks ago when tapered prednisone to   10-5 -10 >  more sob, eyes running and swollen sneezing not responding flonase or clariton  rec 10 mg 2 until better then 2-1-2 and flared with cough on that dose so resumed 2 daily but still cough with deep breath. rec restart 20/day  June 10, 2009 on 20 mg no cough, worried about the ragweed season minimal symptoms. using med calendar well. rec  try 20 a/w 10  07/22/09 doing fine so try floor 10 mg per day, ceiling of 20mg  per day.  August 22, 2009 Followup per Dr Shelle Iron.  Pt was seen here by [facility] on 11/19  for cough rx prednisone burst and add back reglan but just at hs..  Pt states that her cough is worse.  Cough is prod with yellow sputum.  Pt states that her voice "comes and goes".  Breathing is better when she is not coughing. She is taking 40 mg of prednisone daily.  --Tx w/ Levaquin, pred burst, reglan and pepcid added.   September 27, 2009 ov all better on prednisone 10mg  and back to baseline ex tolerance, cough control.   November 08, 2009 ov loosing wt voluntarily, no cough, baseline doe on 02 - rec taper reglan, keep up with med calendar  see page 2 June 22, 2010 Followup.  Pt states that she has been doing well. He has not had any trouble with cough or with SOB.  She does c/o some sinus pressure/drainage x 3 days. not using med calendar or amlodipine or advair. Not using 02 with  ex, not able to loose wt.  >>decrease pred 10 and 5    August 11, 2010 --Presents for follow up and med review. We reviewed her meds and updated her med calendar. Feels over last week her breathing not quite as good. She  has increased her prednisone to 10mg  once daily. Was not able to decrease steroids to 1/2 every other day. no discolred mucus, fever. Denies chest pain, dyspnea, orthopnea, hemoptysis, fever, n/v/d, edema, headache. Requests refills today. Under some stress. Father recently passed. Strained relationship with mother.  rec no change in rx  November 07, 2010 ov cc cough/ strangling  a bit more with taper off reglan (actually worse before ran out of the low dose reglan)  no excess mucus or increase sob on prednisone @  10 mg daily .  Using med calendar well rec 1)  Ok to resume water aerobics but pace yourself 2)  Wait until your swallowing evaulation is complete before making a decision re reglan > stopped it on her own.  3)  Try Prednisone 10 mg one-half each am   12/19/2010  Ov off reglan  cc sob worse on 5 mg per day with lots of joint aches so increased back to 10 mg >  walking [employer] s 02 and cough ok control  as well with no noct symptoms. rec no change rx   03/22/2011 ov/Shamia Uppal overall worse since stopped reglan  now  On pred 10 mg daily and 02 2.5 lpm with ex, sleeping, not at rest.  Pt denies any significant sore throat, dysphagia, itching, sneezing,  nasal congestion or excess/ purulent secretions,  fever, chills, sweats, unintended wt loss, pleuritic or exertional cp, hempoptysis, orthopnea pnd or leg swelling.    Also denies any obvious fluctuation of symptoms with weather or environmental changes or other aggravating or alleviating factors.        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)   -Restart Prednisone 02/24/08   -PFT's December 23, 2008 VC 35%  DLC0 31%   -Desats with > 2 laps 06/22/10 so rec wear 02 2lpm at bedtime and with ex  MORBID OBESITY   - Target wt  =  153  for BMI < 30  Hypertension  Health Maintenance..........................................................Marland KitchenMarland KitchenFelicity Coyer    - Pneumovax July 22, 2009     -Complex med review August 29, 2009  Review of Systems     Objective:   Physical Exam Massively obese pleasant amb bf nad  wt  305 May 28, 2008 >   302 November 07, 2010 > 301  12/19/2010 >  303 03/22/2011  HEENT: nl dentition, turbinates, and orophanx. Nl external ear canals without cough reflex Neck without JVD/Nodes/TM Lungs distant bs, coarse BS w/ no wheezing / no cough on insp RRR no s3 or murmur or increase in P2. no edema Abd soft and benign with nl excursion in the supine position. Ext warm without calf tenderness, cyanosis clubbing       Assessment & Plan:

## 2011-03-23 ENCOUNTER — Encounter: Payer: Self-pay | Admitting: Internal Medicine

## 2011-03-23 NOTE — Assessment & Plan Note (Addendum)
The goal with a chronic steroid dependent illness is always arriving at the lowest effective dose that controls the disease/symptoms and not accepting a set "formula" which is based on statistics or guidelines that don't always take into account patient  variability or the natural hx of the dz in every individual patient, which may well vary over time.  For now therefore I recommend the patient maintain  10 mg daily since flared on 5 mg per day  Use of PPI is associated with improved survival time and with decreased radiologic fibrosis per King's study published in AJRCCM vol 184 p1390.  Dec 2011  This may not be cause and effect, but given how universally unhelpful all the otherstudy drugs have been for pf,   rec max  rx ppi / diet/ lifestyle modification and add back low dose reglan   Discussed in detail all the  indications, usual  risks and alternatives  relative to the benefits with patient who agrees to proceed with repeat challenge with low dose relgan using cough severity frequency to determine response to rx

## 2011-03-23 NOTE — Assessment & Plan Note (Addendum)
I had an extended discussion with the patient today lasting 15 to 20 minutes of a 25 minute visit on the following issues:  This is potentially the most reversible aspect of her illness and is made more difficult by pred dependency which paradoxically might improve with wt loss. See concerns re occult gerd contributing to symptoms

## 2011-05-07 ENCOUNTER — Ambulatory Visit: Payer: BC Managed Care – PPO | Admitting: Internal Medicine

## 2011-05-29 ENCOUNTER — Other Ambulatory Visit: Payer: Self-pay | Admitting: Internal Medicine

## 2011-06-21 ENCOUNTER — Ambulatory Visit (INDEPENDENT_AMBULATORY_CARE_PROVIDER_SITE_OTHER): Payer: BC Managed Care – PPO | Admitting: Adult Health

## 2011-06-21 ENCOUNTER — Encounter: Payer: Self-pay | Admitting: Adult Health

## 2011-06-21 DIAGNOSIS — Z23 Encounter for immunization: Secondary | ICD-10-CM

## 2011-06-21 DIAGNOSIS — J8410 Pulmonary fibrosis, unspecified: Secondary | ICD-10-CM

## 2011-06-21 DIAGNOSIS — K219 Gastro-esophageal reflux disease without esophagitis: Secondary | ICD-10-CM

## 2011-06-21 NOTE — Progress Notes (Signed)
Addended by: Christen Butter on: 06/21/2011 09:52 AM   Modules accepted: Orders

## 2011-06-21 NOTE — Assessment & Plan Note (Signed)
Unable to tolerate reglan Cont Twice daily  PPI and At bedtime  pepcid

## 2011-06-21 NOTE — Progress Notes (Signed)
Addended by: Charlott Holler on: 06/21/2011 10:37 AM   Modules accepted: Orders

## 2011-06-21 NOTE — Progress Notes (Signed)
Subjective:    Patient ID: [name], female    DOB: [birth year], 40 y.o.   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  Since onset waxing/waning sx requiring intermittent prednisone tapers with only minimal improvement - most of her symptoms chronically have been related to obesity with dyspnea on exertion and tendency to chronic cough which is felt to be at least partly  reflux related.     10/9/9  ov  maintained on 10 mg/day without flare of cough or sob.   Feb 17, 2009 better until 2 weeks ago when tapered prednisone to   10-5 -10 >  more sob, eyes running and swollen sneezing not responding flonase or clariton  rec 10 mg 2 until better then 2-1-2 and flared with cough on that dose so resumed 2 daily but still cough with deep breath. rec restart 20/day  June 10, 2009 on 20 mg no cough, worried about the ragweed season minimal symptoms. using med calendar well. rec  try 20 a/w 10  07/22/09 doing fine so try floor 10 mg per day, ceiling of 20mg  per day.  August 22, 2009 Followup per Dr Shelle Iron.  Pt was seen here by [facility] on 11/19  for cough rx prednisone burst and add back reglan but just at hs..  Pt states that her cough is worse.  Cough is prod with yellow sputum.  Pt states that her voice "comes and goes".  Breathing is better when she is not coughing. She is taking 40 mg of prednisone daily.  --Tx w/ Levaquin, pred burst, reglan and pepcid added.   September 27, 2009 ov all better on prednisone 10mg  and back to baseline ex tolerance, cough control.   November 08, 2009 ov loosing wt voluntarily, no cough, baseline doe on 02 - rec taper reglan, keep up with med calendar  see page 2 June 22, 2010 Followup.  Pt states that she has been doing well. He has not had any trouble with cough or with SOB.  She does c/o some sinus pressure/drainage x 3 days. not using med calendar or amlodipine or advair. Not using 02 with  ex, not able to loose wt.  >>decrease pred 10 and 5    August 11, 2010 --Presents for follow up and med review. We reviewed her meds and updated her med calendar. Feels over last week her breathing not quite as good. She  has increased her prednisone to 10mg  once daily. Was not able to decrease steroids to 1/2 every other day. no discolred mucus, fever. Denies chest pain, dyspnea, orthopnea, hemoptysis, fever, n/v/d, edema, headache. Requests refills today. Under some stress. Father recently passed. Strained relationship with mother.  rec no change in rx  November 07, 2010 ov cc cough/ strangling  a bit more with taper off reglan (actually worse before ran out of the low dose reglan)  no excess mucus or increase sob on prednisone @  10 mg daily .  Using med calendar well rec 1)  Ok to resume water aerobics but pace yourself 2)  Wait until your swallowing evaulation is complete before making a decision re reglan > stopped it on her own.  3)  Try Prednisone 10 mg one-half each am   12/19/2010  Ov off reglan  cc sob worse on 5 mg per day with lots of joint aches so increased back to 10 mg >  walking [employer] s 02 and cough ok control  as well with no noct symptoms. rec no change rx   03/22/2011 ov/Wert overall worse since stopped reglan  now  On pred 10 mg daily and 02 2.5 lpm with ex, sleeping, not at rest. >>added reglan 5mg  Twice daily  -   06/21/2011 Follow up and med review  Returns for follow up and med review. Last ov reglan was added back in but she was unable to tolerate due to eye twitching.  Breathing overall is doing good with no flare in cough or dyspnea.  Last xray was 2010 with chronic changes. Due for flu shot today.  No hemoptysis , increased dyspnea or saba use.  We reviewed all her meds and updated her med calendar. It appears she is taking her meds correctly.  Currently on prednisone 10mg  At bedtime        Past Medical History: GASTROESOPHAGEAL REFLUX DISEASE     -  Taper reglan to 10 mg one half twice daily June 22, 2010 > d/c'd 11/2010  -restarted reglan 03/2011 >>unable to tolerate.  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)   -Restart Prednisone 02/24/08   -PFT's December 23, 2008 VC 35%  DLC0 31%   -Desats with > 2 laps 06/22/10 so rec wear 02 2lpm at bedtime and with ex  MORBID OBESITY   - Target wt  =  153  for BMI < 30  Hypertension  Health Maintenance..........................................................Marland KitchenMarland KitchenLeschber    - Pneumovax July 22, 2009      - Flu 06/21/2011  Complex med regimen 06/21/2011    Review of Systems Constitutional:   No  weight loss, night sweats,  Fevers, chills, fatigue, or  lassitude.  HEENT:   No headaches,  Difficulty swallowing,  Tooth/dental problems, or  Sore throat,                No sneezing, itching, ear ache, nasal congestion, post nasal drip,   CV:  No chest pain,  Orthopnea, PND, swelling in lower extremities, anasarca, dizziness, palpitations, syncope.   GI  No heartburn, indigestion, abdominal pain, nausea, vomiting,   loss of appetite, bloody stools.   Resp:   No coughing up of blood.  No change in color of mucus.  No wheezing.  No chest wall deformity  Skin: no rash or lesions.  GU: no dysuria, change in color of urine, no urgency or frequency.  No flank pain, no hematuria   MS:  No joint pain or swelling.  No decreased range of motion.  No back pain.  Psych:  No change in mood or affect. No depression or anxiety.  No memory loss.          Objective:   Physical Exam Massively obese pleasant amb bf nad  wt  305 May 28, 2008 >   302 November 07, 2010 > 301  12/19/2010 >  303 03/22/2011 >>299 06/21/2011  HEENT: nl dentition, turbinates, and orophanx. Nl external ear canals without cough reflex Neck without JVD/Nodes/TM Lungs distant bs, coarse BS w/ no wheezing / no cough on insp RRR no s3 or murmur  or increase in P2. no edema Abd soft and benign with nl excursion in the supine position. Ext warm without calf tenderness, cyanosis clubbing       Assessment & Plan:

## 2011-06-21 NOTE — Patient Instructions (Addendum)
Stay on Prednisone 10mg  daily  Follow med calendar closely and bring to each visit.  I will call with xray results.  follow up Dr. Sherene Sires  In 3 months and As needed   Flu shot today

## 2011-06-21 NOTE — Assessment & Plan Note (Addendum)
Compensated on present regimen Hold on prednisone 10mg  daily for now since she flared on 5mg   Patient's medications were reviewed today and patient education was given. Computerized medication calendar was adjusted/completed cxr today  Flu shot today

## 2011-07-10 LAB — URINALYSIS, ROUTINE W REFLEX MICROSCOPIC
Bilirubin Urine: NEGATIVE
Ketones, ur: NEGATIVE
Nitrite: NEGATIVE
Protein, ur: NEGATIVE
pH: 5.5

## 2011-09-22 ENCOUNTER — Other Ambulatory Visit: Payer: Self-pay | Admitting: Internal Medicine

## 2011-09-27 ENCOUNTER — Telehealth: Payer: Self-pay | Admitting: Internal Medicine

## 2011-09-27 MED ORDER — PREDNISONE 10 MG PO TABS: 10.0000 mg | ORAL_TABLET | Freq: Every day | ORAL | Status: DC

## 2011-09-27 NOTE — Telephone Encounter (Signed)
rx for the prednisone has been sent to the pharmacy for #30 with no refills for this pt.

## 2011-10-25 ENCOUNTER — Encounter: Payer: Self-pay | Admitting: Internal Medicine

## 2011-10-25 ENCOUNTER — Ambulatory Visit (INDEPENDENT_AMBULATORY_CARE_PROVIDER_SITE_OTHER): Payer: BC Managed Care – PPO | Admitting: Internal Medicine

## 2011-10-25 VITALS — BP 132/84 | HR 88 | Temp 97.6°F | Ht 61.0 in | Wt 299.0 lb

## 2011-10-25 DIAGNOSIS — J8410 Pulmonary fibrosis, unspecified: Secondary | ICD-10-CM

## 2011-10-25 MED ORDER — PREDNISONE 10 MG PO TABS: 10.0000 mg | ORAL_TABLET | Freq: Every day | ORAL | Status: DC

## 2011-10-25 NOTE — Patient Instructions (Signed)
Prednisone 10 mg x 2 until better, then 20/10 x one week, one weekly thereafter as per calendar  Please schedule a follow up visit in 3 months but call sooner if needed

## 2011-10-25 NOTE — Progress Notes (Signed)
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  Since onset waxing/waning sx requiring intermittent prednisone tapers with only minimal improvement - most of her symptoms chronically have been related to obesity with dyspnea on exertion and tendency to chronic cough which is felt to be at least partly  reflux related.     10/9/9  ov  maintained on 10 mg/day without flare of cough or sob.   Feb 17, 2009 better until 2 weeks ago when tapered prednisone to   10-5 -10 >  more sob, eyes running and swollen sneezing not responding flonase or clariton  rec 10 mg 2 until better then 2-1-2 and flared with cough on that dose so resumed 2 daily but still cough with deep breath. rec restart 20/day  June 10, 2009 on 20 mg no cough, worried about the ragweed season minimal symptoms. using med calendar well. rec  try 20 a/w 10  07/22/09 doing fine so try floor 10 mg per day, ceiling of 20mg  per day.  August 22, 2009 Followup per Dr Shelle Iron.  Pt was seen here by [name] on 11/19  for cough rx prednisone burst and add back reglan but just at hs..  Pt states that her cough is worse.  Cough is prod with yellow sputum.  Pt states that her voice "comes and goes".  Breathing is better when she is not coughing. She is taking 40 mg of prednisone daily.  --Tx w/ Levaquin, pred burst, reglan and pepcid added.   September 27, 2009 ov all better on prednisone 10mg  and back to baseline ex tolerance, cough control.   November 08, 2009 ov loosing wt voluntarily, no cough, baseline doe on 02 - rec taper reglan, keep up with med calendar  see page 2 June 22, 2010 Followup.  Pt states that she has been doing well. He has not had any trouble with cough or with SOB.  She does c/o some sinus pressure/drainage x 3 days. not using med calendar or amlodipine or advair. Not using 02 with ex, not  able to loose wt.  >>decrease pred 10 and 5    August 11, 2010 --Presents for follow up and med review. We reviewed her meds and updated her med calendar. Feels over last week her breathing not quite as good. She  has increased her prednisone to 10mg  once daily. Was not able to decrease steroids to 1/2 every other day. no discolred mucus, fever. Denies chest pain, dyspnea, orthopnea, hemoptysis, fever, n/v/d, edema, headache. Requests refills today. Under some stress. Father recently passed. Strained relationship with mother.  rec no change in rx  November 07, 2010 ov cc cough/ strangling  a bit more with taper off reglan (actually worse before ran out of the low dose reglan)  no excess mucus or increase sob on prednisone @  10 mg daily .  Using med calendar well rec 1)  Ok to resume water aerobics but pace yourself 2)  Wait until your swallowing evaulation is complete before making a decision re reglan > stopped it on her own.  3)  Try Prednisone 10 mg one-half each am   12/19/2010  Ov off reglan  cc sob worse on 5 mg per day with lots of joint aches so increased back to 10 mg >  walking [employer] s 02 and cough ok control  as well with no noct symptoms. rec no change rx   03/22/2011 ov/[name] overall worse since stopped reglan  now  On pred 10 mg daily and 02 2.5 lpm with ex, sleeping, not at rest. >>added reglan 5mg  Twice daily  -   06/21/2011 Follow up and med review  Returns for follow up and med review. Last ov reglan was added back in but she was unable to tolerate due to eye twitching.  Breathing overall is doing good with no flare in cough or dyspnea.  Last xray was 2010 with chronic changes. Due for flu shot today.  No hemoptysis , increased dyspnea or saba use.  We reviewed all her meds and updated her med calendar. It appears she is taking her meds correctly.  Currently on prednisone 10mg  At bedtime   rec Stay on Prednisone 10mg  daily  Follow med calendar closely and bring to  each visit.  I will call with xray results.  follow up Dr. Sherene Sires  In 3 months and As needed   Flu shot today    10/25/2011 f/u ov/[name] on Prednisone 20 mg per day x one week due to increased sob and dry cough better on 20 and 02 2lpm and prn > can  walk on 02 @ [place] slow pace only.   Sleeping ok without nocturnal  or early am exacerbation  of respiratory  c/o's . Also denies any obvious fluctuation of symptoms with weather or environmental changes or other aggravating or alleviating factors except as outlined above   ROS  At present neg for  any significant sore throat, dysphagia, itching, sneezing,  nasal congestion or excess/ purulent secretions,  fever, chills, sweats, unintended wt loss, pleuritic or exertional cp, hempoptysis, orthopnea pnd or leg swelling.  Also denies presyncope, palpitations, heartburn, abdominal pain, nausea, vomiting, diarrhea  or change in bowel or urinary habits, dysuria,hematuria,  rash, arthralgias, visual complaints, headache, numbness weakness or ataxia.         Past Medical History: GASTROESOPHAGEAL REFLUX DISEASE     - Taper reglan to 10 mg one half twice daily June 22, 2010 > d/c'd 11/2010  -restarted reglan 03/2011 >>unable to tolerate.  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)   -Restart Prednisone 02/24/08   -PFT's December 23, 2008 VC 35%  DLC0 31%   -Desats with > 2 laps 06/22/10 so rec wear 02 2lpm at bedtime and with ex  MORBID OBESITY   - Target wt  =  153  for BMI < 30  Hypertension Health Maintenance..........................................................Marland KitchenMarland KitchenFelicity Adkins    - Pneumovax July 22, 2009      - Flu 06/21/2011  Complex med regimen 06/21/2011              Objective:   Physical Exam Massively obese pleasant amb bf nad  wt  305 May 28, 2008 >   302 November 07, 2010 >   303 03/22/2011 > 10/25/2011  299 HEENT: nl dentition, turbinates,  and orophanx. Nl external ear canals without cough reflex Neck without JVD/Nodes/TM Lungs distant bs, coarse BS w/ no wheezing / no cough on insp RRR no s3 or murmur or increase in P2. no edema Abd soft and benign with nl excursion in the supine position. Ext warm without calf tenderness, cyanosis clubbing      cxr 06/21/11 Assessment & Plan:

## 2011-10-25 NOTE — Assessment & Plan Note (Signed)
-  VATS 02/15/2004 NSIP (Katzenstein reviewed/confirmed)   -Restart Prednisone 02/24/08   -PFT's December 23, 2008 VC 35%  DLC0 31%   -Desats with > 2 laps 06/22/10 so rec wear 02 2lpm at bedtime and with ex  The goal with a chronic steroid dependent illness is always arriving at the lowest effective dose that controls the disease/symptoms and not accepting a set "formula" which is based on statistics or guidelines that don't always take into account patient  variability or the natural hx of the dz in every individual patient, which may well vary over time.  For now therefore I recommend the patient maintain  Ceiling of 20 mg per day and floor of 10    Each maintenance medication was reviewed in detail including most importantly the difference between maintenance and as needed and under what circumstances the prns are to be used. This was done in the context of a medication calendar review which provided the patient with a user-friendly unambiguous mechanism for medication administration and reconciliation and provides an action plan for all active problems. It is critical that this be shown to every doctor  for modification during the office visit if necessary so the patient can use it as a working document.

## 2011-11-14 ENCOUNTER — Ambulatory Visit (INDEPENDENT_AMBULATORY_CARE_PROVIDER_SITE_OTHER): Payer: BC Managed Care – PPO | Admitting: Internal Medicine

## 2011-11-14 ENCOUNTER — Encounter: Payer: Self-pay | Admitting: Internal Medicine

## 2011-11-14 DIAGNOSIS — J0110 Acute frontal sinusitis, unspecified: Secondary | ICD-10-CM

## 2011-11-14 DIAGNOSIS — J309 Allergic rhinitis, unspecified: Secondary | ICD-10-CM

## 2011-11-14 DIAGNOSIS — J8410 Pulmonary fibrosis, unspecified: Secondary | ICD-10-CM

## 2011-11-14 DIAGNOSIS — J209 Acute bronchitis, unspecified: Secondary | ICD-10-CM

## 2011-11-14 MED ORDER — LEVOFLOXACIN 500 MG PO TABS: 500.0000 mg | ORAL_TABLET | Freq: Every day | ORAL | Status: DC

## 2011-11-14 MED ORDER — METHYLPREDNISOLONE ACETATE 80 MG/ML IJ SUSP
120.0000 mg | Freq: Once | INTRAMUSCULAR | Status: AC
  Administered 2011-11-14: 120 mg via INTRAMUSCULAR

## 2011-11-14 MED ORDER — HYDROCODONE-HOMATROPINE 5-1.5 MG/5ML PO SYRP: 5.0000 mL | ORAL_SOLUTION | Freq: Four times a day (QID) | ORAL | Status: AC | PRN

## 2011-11-14 NOTE — Patient Instructions (Signed)
It was good to see you today. Steroid shot given today in office Levaquin antibiotics and prescription cough syrup - Your prescription(s) have been submitted to your pharmacy. Please take as directed and contact our office if you believe you are having problem(s) with the medication(s). continue your other ongoing medications for breathing and steroids as regularly prescribed, no change Call here or Dr. Sherene Sires for followup if unimproved in next 7-10 days with treatment, sooner if worse

## 2011-11-14 NOTE — Progress Notes (Signed)
  Subjective:    HPI  complains of cold symptoms  Onset >1 week ago, wax/wane symptoms  associated with rhinorrhea, sneezing, sore throat, mild headache and low grade fever Also myalgias, sinus pressure and mild-mod chest congestion No relief with OTC meds Precipitated by sick contacts  Past Medical History  Diagnosis Date  . Dysphagia   . Allergic rhinitis   . Unspecified essential hypertension   . Mild depression   . Headache   . Urge incontinence   . Chronic rhinitis   . Dyspnea   . GERD (gastroesophageal reflux disease)   . Cough   . Bronchitis   . ILD (interstitial lung disease)   . Morbid obesity     Review of Systems Constitutional: No fever, no unexpected weight change Pulmonary: No pleurisy or hemoptysis Cardiovascular: No chest pain or palpitations     Objective:   Physical Exam BP 136/92  Pulse 100  Temp(Src) 98.9 F (37.2 C) (Oral)  Wt 302 lb 12.8 oz (137.349 kg)  SpO2 99% GEN: mildly ill appearing and audible head/chest congestion - family at side HENT: NCAT, mild sinus tenderness L frontal region, nares with clear discharge, oropharynx mild erythema, no exudate Eyes: Vision grossly intact, no conjunctivitis Lungs: Clear to auscultation without rhonchi or wheeze, no increased work of breathing Cardiovascular: Regular rate and rhythm, no bilateral edema      Assessment & Plan:  Viral URI trigger Acute bronchitis flare L frontal sinusitis ILD, chronic steroids Cough, postnasal drip related to above allergic rhinitis     Empiric antibiotics prescribed due to symptom duration greater than 7 days and comorbid dz IM medrol Prescription cough suppression - new prescriptions done Symptomatic care with Tylenol or Advil, hydration and rest -  salt gargle advised as needed Antihistamines, nasal steroids decongestants recommended

## 2011-12-18 ENCOUNTER — Other Ambulatory Visit: Payer: Self-pay

## 2011-12-18 MED ORDER — DOXYCYCLINE HYCLATE 100 MG PO TABS: 100.0000 mg | ORAL_TABLET | Freq: Two times a day (BID) | ORAL | Status: AC

## 2011-12-18 NOTE — Telephone Encounter (Signed)
Pt advised in detail of Rx and MD's recommendation.

## 2011-12-18 NOTE — Telephone Encounter (Signed)
Alternate between ibuprofen and tylenol for aches, pain and fever symptoms - doxy bid x 7d for antibiotics - OV if unimporved

## 2011-12-18 NOTE — Telephone Encounter (Signed)
Pt called c/o of sore throat, productive dark yellow cough, congestion and body pain x 1 week

## 2012-01-23 ENCOUNTER — Ambulatory Visit: Payer: BC Managed Care – PPO | Admitting: Internal Medicine

## 2012-02-11 ENCOUNTER — Other Ambulatory Visit: Payer: Self-pay

## 2012-02-11 ENCOUNTER — Telehealth: Payer: Self-pay | Admitting: Internal Medicine

## 2012-02-11 MED ORDER — TOLTERODINE TARTRATE 2 MG PO TABS: 2.0000 mg | ORAL_TABLET | Freq: Every day | ORAL | Status: DC

## 2012-02-11 MED ORDER — PREDNISONE 10 MG PO TABS: 10.0000 mg | ORAL_TABLET | Freq: Every day | ORAL | Status: DC

## 2012-02-11 MED ORDER — FAMOTIDINE 20 MG PO TABS: 20.0000 mg | ORAL_TABLET | Freq: Every day | ORAL | Status: DC

## 2012-02-11 MED ORDER — ESOMEPRAZOLE MAGNESIUM 40 MG PO CPDR: DELAYED_RELEASE_CAPSULE | ORAL | Status: DC

## 2012-02-11 NOTE — Telephone Encounter (Signed)
Refills sent. Pt requests 90 days. Pt aware. Carron Curie, CMA

## 2012-02-29 ENCOUNTER — Other Ambulatory Visit (INDEPENDENT_AMBULATORY_CARE_PROVIDER_SITE_OTHER): Payer: BC Managed Care – PPO

## 2012-02-29 ENCOUNTER — Encounter: Payer: Self-pay | Admitting: Internal Medicine

## 2012-02-29 ENCOUNTER — Telehealth: Payer: Self-pay | Admitting: Internal Medicine

## 2012-02-29 ENCOUNTER — Ambulatory Visit (INDEPENDENT_AMBULATORY_CARE_PROVIDER_SITE_OTHER): Payer: BC Managed Care – PPO | Admitting: Internal Medicine

## 2012-02-29 VITALS — BP 140/98 | HR 92 | Temp 98.2°F | Ht 61.0 in | Wt 309.4 lb

## 2012-02-29 DIAGNOSIS — J8410 Pulmonary fibrosis, unspecified: Secondary | ICD-10-CM

## 2012-02-29 DIAGNOSIS — I10 Essential (primary) hypertension: Secondary | ICD-10-CM

## 2012-02-29 LAB — CBC WITH DIFFERENTIAL/PLATELET
Basophils Absolute: 0 10*3/uL (ref 0.0–0.1)
HCT: 38.7 % (ref 36.0–46.0)
Hemoglobin: 12.3 g/dL (ref 12.0–15.0)
Lymphs Abs: 2 10*3/uL (ref 0.7–4.0)
MCV: 91.3 fl (ref 78.0–100.0)
Monocytes Absolute: 0.8 10*3/uL (ref 0.1–1.0)
Monocytes Relative: 10.8 % (ref 3.0–12.0)
Neutro Abs: 4.3 10*3/uL (ref 1.4–7.7)
Platelets: 247 10*3/uL (ref 150.0–400.0)
RDW: 15.3 % — ABNORMAL HIGH (ref 11.5–14.6)

## 2012-02-29 LAB — BASIC METABOLIC PANEL
BUN: 11 mg/dL (ref 6–23)
CO2: 30 mEq/L (ref 19–32)
Chloride: 100 mEq/L (ref 96–112)
GFR: 132.23 mL/min (ref >60.00)
Glucose, Bld: 129 mg/dL — ABNORMAL HIGH (ref 70–99)
Potassium: 4 mEq/L (ref 3.5–5.1)
Sodium: 137 mEq/L (ref 135–145)

## 2012-02-29 LAB — URINALYSIS, ROUTINE W REFLEX MICROSCOPIC
Bilirubin Urine: NEGATIVE
Hgb urine dipstick: NEGATIVE
Ketones, ur: NEGATIVE
Nitrite: NEGATIVE
Total Protein, Urine: NEGATIVE
pH: 6 (ref 5.0–8.0)

## 2012-02-29 MED ORDER — LOSARTAN POTASSIUM-HCTZ 100-25 MG PO TABS: 1.0000 | ORAL_TABLET | Freq: Every day | ORAL | Status: DC

## 2012-02-29 NOTE — Progress Notes (Signed)
  Subjective:    Patient ID: [name], female    DOB: 1971-07-10, 40 y.o.   MRN: [id number]  HPI  Here for uncontrolled hypertension Reports taking meds as rx'd Denies med or diet changes - no NSAIDs  Past Medical History  Diagnosis Date  . Dysphagia   . Allergic rhinitis   . Unspecified essential hypertension   . Mild depression   . Headache   . Urge incontinence   . Chronic rhinitis   . Dyspnea   . GERD (gastroesophageal reflux disease)   . Cough   . Bronchitis   . ILD (interstitial lung disease)   . Morbid obesity     Review of Systems  Constitutional: Positive for fatigue and unexpected weight change. Negative for fever.  Respiratory: Positive for shortness of breath. Negative for cough.   Cardiovascular: Positive for leg swelling. Negative for chest pain and palpitations.       Objective:   Physical Exam BP 140/98  Pulse 92  Temp(Src) 98.2 F (36.8 C) (Oral)  Ht 5\' 1"  (1.549 m)  Wt 309 lb 6.4 oz (140.343 kg)  BMI 58.46 kg/m2  SpO2 94% Wt Readings from Last 3 Encounters:  02/29/12 309 lb 6.4 oz (140.343 kg)  11/14/11 302 lb 12.8 oz (137.349 kg)  10/25/11 299 lb (135.626 kg)   Constitutional: She is obese, but appears well-developed and well-nourished. No distress.  Eyes: Conjunctivae and EOM are normal. Pupils are equal, round, and reactive to light. No scleral icterus.  Neck: Normal range of motion. Neck supple. No JVD present. No thyromegaly present.  Cardiovascular: Normal rate, regular rhythm and normal heart sounds.  No murmur heard. No BLE edema. Pulmonary/Chest: Effort normal and breath sounds normal. No respiratory distress. She has no wheezes.  Neurological: She is alert and oriented to person, place, and time. No cranial nerve deficit. Coordination normal.  Psychiatric: She has a normal mood and affect. Her behavior is normal. Judgment and thought content normal.   Lab Results  Component Value Date   WBC 6.4 10/31/2010   HGB 12.9 10/31/2010   HCT 38.6 10/31/2010   PLT 218.0 10/31/2010   GLUCOSE 91 10/31/2010   CHOL 185 10/31/2010   TRIG 40.0 10/31/2010   HDL 42.10 10/31/2010   LDLCALC 135* 10/31/2010   ALT 23 10/31/2010   AST 18 10/31/2010   NA 140 10/31/2010   K 4.7 10/31/2010   CL 105 10/31/2010   CREATININE 0.5 10/31/2010   BUN 11 10/31/2010   CO2 29 10/31/2010   TSH 0.94 10/31/2010        Assessment & Plan:  See problem list. Medications and labs reviewed today.

## 2012-02-29 NOTE — Assessment & Plan Note (Signed)
follow with Wert - steroid and O2 dep Recheck Echo given increase in edema and HTN

## 2012-02-29 NOTE — Assessment & Plan Note (Signed)
Wt Readings from Last 3 Encounters:  02/29/12 309 lb 6.4 oz (140.343 kg)  11/14/11 302 lb 12.8 oz (137.349 kg)  10/25/11 299 lb (135.626 kg)   The patient is asked to make an attempt to improve diet and exercise patterns to aid in medical management of this problem.

## 2012-02-29 NOTE — Assessment & Plan Note (Signed)
BP Readings from Last 3 Encounters:  02/29/12 140/98  11/14/11 136/92  10/25/11 132/84   Reports acute increase BP in last few days Check labs Start ARB/hctz now - we reviewed potential risk/benefit and possible side effects - pt understands and agrees to same

## 2012-02-29 NOTE — Telephone Encounter (Signed)
Noted - seen OV today

## 2012-02-29 NOTE — Telephone Encounter (Signed)
Caller: [name]/Patient; PCP: Rene Paci; CB#: [phone]; Call regarding Edema;  Cozette developed edema of her feet/legs on approx 02/18/12.  Feet are very swollen and worsen throughout the day.  C/o headache today.  BP 138/115.  Had chest pressure at 0730 this morning, has not had it since that time.  Utilized Leg Non -Injury and Hypertension Guidelines.  See PCP within 4 hrs disposition.  No appts in EPIC.  Called office and appt was scheduled.  Parameters reviewed concerning when to call back.

## 2012-02-29 NOTE — Patient Instructions (Signed)
It was good to see you today. Test(s) ordered today. Your results will be called to you after review (48-72hours after test completion). If any changes need to be made, you will be notified at that time. we'll make referral for heart ultrasound (echo) . Our office will contact you regarding appointment(s) once made. Stat losartan hct every day for blood pressure - Your prescription(s) have been submitted to your pharmacy. Please take as directed and contact our office if you believe you are having problem(s) with the medication(s). Please schedule followup in 2 weeks, call sooner if problems.

## 2012-03-03 ENCOUNTER — Telehealth: Payer: Self-pay | Admitting: Internal Medicine

## 2012-03-03 MED ORDER — LOSARTAN POTASSIUM-HCTZ 100-12.5 MG PO TABS: 1.0000 | ORAL_TABLET | Freq: Every day | ORAL | Status: DC

## 2012-03-03 NOTE — Telephone Encounter (Signed)
Pt return call back she pt agreed to change med, Inform pt will send to her pharmacy and to keep her f/u appt as well.Per Dr. Jonny Ruiz ok to send new losartan in..03/03/12@1 :50pm/LMB

## 2012-03-03 NOTE — Telephone Encounter (Signed)
Called left message to call back 

## 2012-03-03 NOTE — Telephone Encounter (Signed)
Caller: [name]/Patient; PCP: Rene Paci; CB#: [phone];UJWJ regarding started Losartan HCTZ 02/29/12.  Cramping in abdomen bilaterally, shin, right deltoid area.  Cramps onset 0200 on 03/01/12.   States pain woke her up.  BP 108/76 Asks if there is alternative medication to this.  Per Allergic Reactions protocol, Call provider within 4 hours.  Note to Dr. Felicity Coyer for follow up since office currently open.

## 2012-03-03 NOTE — Telephone Encounter (Signed)
Caller: Orrie/Patient; PCP: Rene Paci; CB#: [phone]. Call regarding Missed Callback. Caller reports she called this am re a problem with her medication. She missed a callback from Zella Ball and was advised to callback.  Caller advised a note will be sent for nurse for another callback. Caller asked to keep phone nearby.

## 2012-03-03 NOTE — Telephone Encounter (Signed)
Note reviewed;  Pt recently started losartan HCT 100/25 for HTN, also with hx periph edema  Cramping can be related to the HCT 25 mg part, though this would be quite fast as she just started  Please ask pt, in order to cont to try to tx the BP as well as the edema, we could change the losartan HCT to 100/12.5mg   - with the 12.5 mg part (being less than the 25) is very Unusual with causing the cramp, and be able to tx her BP and swelling well enough;  Please let me know

## 2012-03-12 ENCOUNTER — Ambulatory Visit: Payer: BC Managed Care – PPO | Admitting: Internal Medicine

## 2012-03-19 ENCOUNTER — Ambulatory Visit (INDEPENDENT_AMBULATORY_CARE_PROVIDER_SITE_OTHER): Payer: BC Managed Care – PPO | Admitting: Internal Medicine

## 2012-03-19 ENCOUNTER — Encounter: Payer: Self-pay | Admitting: Internal Medicine

## 2012-03-19 VITALS — BP 112/82 | HR 106 | Temp 97.3°F | Ht 61.0 in | Wt 307.0 lb

## 2012-03-19 DIAGNOSIS — I10 Essential (primary) hypertension: Secondary | ICD-10-CM

## 2012-03-19 MED ORDER — LOSARTAN POTASSIUM-HCTZ 100-12.5 MG PO TABS: 1.0000 | ORAL_TABLET | Freq: Every day | ORAL | Status: DC

## 2012-03-19 NOTE — Patient Instructions (Signed)
It was good to see you today. Continue losartan hct every day for blood pressure - Your prescription(s) refill have been submitted to your pharmacy. Please take as directed and contact our office if you believe you are having problem(s) with the medication(s). Look into bariatric options - call for paperwork appointment once you have started the process Please schedule followup in 3-4 months for recheck, call sooner if problems.

## 2012-03-19 NOTE — Progress Notes (Signed)
  Subjective:    Patient ID: [name], female    DOB: [birth year], 41 y.o.   MRN: [id number]  HPI   Here for follow up - hypertension Reports taking meds as rx'd Denies med or diet changes - no NSAIDs  Past Medical History  Diagnosis Date  . Dysphagia   . Allergic rhinitis   . Unspecified essential hypertension   . Mild depression   . Headache   . Urge incontinence   . Chronic rhinitis   . Dyspnea   . GERD (gastroesophageal reflux disease)   . Cough   . Bronchitis   . ILD (interstitial lung disease)   . Morbid obesity     Review of Systems  Constitutional: Positive for fatigue. Negative for fever and unexpected weight change.  Respiratory: Negative for cough and shortness of breath.   Cardiovascular: Negative for chest pain, palpitations and leg swelling.       Objective:   Physical Exam  BP 112/82  Pulse 106  Temp 97.3 F (36.3 C) (Oral)  Ht 5\' 1"  (1.549 m)  Wt 307 lb (139.254 kg)  BMI 58.01 kg/m2  SpO2 97% Wt Readings from Last 3 Encounters:  03/19/12 307 lb (139.254 kg)  02/29/12 309 lb 6.4 oz (140.343 kg)  11/14/11 302 lb 12.8 oz (137.349 kg)   Constitutional: She is obese, but appears well-developed and well-nourished. No distress.  Eyes: Conjunctivae and EOM are normal. Pupils are equal, round, and reactive to light. No scleral icterus.  Neck: Normal range of motion. Neck supple. No JVD present. No thyromegaly present.  Cardiovascular: Normal rate, regular rhythm and normal heart sounds.  No murmur heard. No BLE edema. Pulmonary/Chest: Effort normal and breath sounds normal. No respiratory distress. She has no wheezes.  Neurological: She is alert and oriented to person, place, and time. No cranial nerve deficit. Coordination normal.  Psychiatric: She has a normal mood and affect. Her behavior is normal. Judgment and thought content normal.   Lab Results  Component Value Date   WBC 7.1 02/29/2012   HGB 12.3 02/29/2012   HCT 38.7 02/29/2012   PLT  247.0 02/29/2012   GLUCOSE 129* 02/29/2012   CHOL 185 10/31/2010   TRIG 40.0 10/31/2010   HDL 42.10 10/31/2010   LDLCALC 135* 10/31/2010   ALT 23 10/31/2010   AST 18 10/31/2010   NA 137 02/29/2012   K 4.0 02/29/2012   CL 100 02/29/2012   CREATININE 0.6 02/29/2012   BUN 11 02/29/2012   CO2 30 02/29/2012   TSH 2.36 02/29/2012        Assessment & Plan:  See problem list. Medications and labs reviewed today.

## 2012-03-19 NOTE — Assessment & Plan Note (Signed)
Wt Readings from Last 3 Encounters:  03/19/12 307 lb (139.254 kg)  02/29/12 309 lb 6.4 oz (140.343 kg)  11/14/11 302 lb 12.8 oz (137.349 kg)   The patient is asked to make an attempt to improve diet and exercise patterns to aid in medical management of this problem. Will investigate bariatric options

## 2012-03-19 NOTE — Assessment & Plan Note (Signed)
BP Readings from Last 3 Encounters:  03/19/12 112/82  02/29/12 140/98  11/14/11 136/92   Started ARB/hctz 02/29/12 - improved The current medical regimen is effective;  continue present plan and medications.

## 2012-04-04 ENCOUNTER — Encounter: Payer: Self-pay | Admitting: Internal Medicine

## 2012-04-04 ENCOUNTER — Ambulatory Visit (INDEPENDENT_AMBULATORY_CARE_PROVIDER_SITE_OTHER): Payer: BC Managed Care – PPO | Admitting: Internal Medicine

## 2012-04-04 VITALS — BP 110/86 | HR 108 | Temp 98.3°F | Ht 60.0 in | Wt 311.8 lb

## 2012-04-04 DIAGNOSIS — J8410 Pulmonary fibrosis, unspecified: Secondary | ICD-10-CM

## 2012-04-04 DIAGNOSIS — J9610 Chronic respiratory failure, unspecified whether with hypoxia or hypercapnia: Secondary | ICD-10-CM

## 2012-04-04 NOTE — Progress Notes (Signed)
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  Since onset waxing/waning sx requiring intermittent prednisone tapers with only minimal improvement - most of her symptoms chronically have been related to obesity with dyspnea on exertion and tendency to chronic cough which is felt to be at least partly  reflux related.    June 10, 2009 on 20 mg no cough, worried about the ragweed season minimal symptoms. using med calendar well. rec  try 20 a/w 10     November 07, 2010 ov cc cough/ strangling  a bit more with taper off reglan (actually worse before ran out of the low dose reglan)  no excess mucus or increase sob on prednisone @  10 mg daily .  Using med calendar well rec 1)  Ok to resume water aerobics but pace yourself 2)  Wait until your swallowing evaulation is complete before making a decision re reglan > stopped it on her own.  3)  Try Prednisone 10 mg one-half each am   12/19/2010  Ov off reglan  cc sob worse on 5 mg pred per day with lots of joint aches so increased back to 10 mg >   walking [employer] s 02 and cough ok control  as well with no noct symptoms. rec no change rx   03/22/2011 ov/Wert overall worse since stopped reglan  now  On pred 10 mg daily and 02 2.5 lpm with ex, sleeping, not at rest. >>added reglan 5mg  Twice daily  > eye twitching so stopped   06/21/2011 Follow up and med review  Returns for follow up and med review. Last ov reglan was added back in but she was unable to tolerate due to eye twitching.  Breathing overall is doing good with no flare in cough or dyspnea.  Last xray was 2010 with chronic changes. Due for flu shot today.  No hemoptysis , increased dyspnea or saba use.  We reviewed all her meds and updated her med calendar. It appears she is taking her meds correctly.  Currently on prednisone 10mg  At bedtime    rec Stay on Prednisone 10mg  daily  Follow med calendar closely and bring to each visit.     10/25/2011 f/u ov/Wert on Prednisone 20 mg per day x one week due to increased sob and dry cough better on 20 and 02 2lpm and prn > can  walk on 02 @ mall slow pace only always on 2lpm rec Prednisone 10 mg x 2 until better, then 20/10 x one week, one weekly thereafter as per calendar   04/04/2012 f/u ov/Wert no longer using med calendar on Pred 10 mg one daily  added hyzaar to rx for hbp/leg swelling not back to mall walking yet,  No unusual cough, purulent sputum or sinus/hb symptoms on present rx. No variability to symptoms or need for inhaler.    Sleeping ok without nocturnal  or early am exacerbation  of respiratory  c/o's . Also denies any obvious fluctuation of symptoms with weather or environmental changes or other aggravating or alleviating factors except as outlined above.  ROS  The following are not active complaints unless bolded sore throat, dysphagia, dental problems, itching, sneezing,  nasal congestion or excess/ purulent secretions, ear ache,   fever, chills, sweats, unintended wt loss, pleuritic or exertional  cp, hemoptysis,  orthopnea pnd or leg swelling, presyncope, palpitations, heartburn, abdominal pain, anorexia, nausea, vomiting, diarrhea  or change in bowel or urinary habits, change in stools or urine, dysuria,hematuria,  rash, arthralgias, visual complaints, headache, numbness weakness or ataxia or problems with walking or coordination,  change in mood/affect or memory.             Past Medical History: GASTROESOPHAGEAL REFLUX DISEASE     - Taper reglan to 10 mg one half twice daily June 22, 2010 > d/c'd 11/2010  -restarted reglan 03/2011 >>unable to tolerate.  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)   -Restart Prednisone 02/24/08   -PFT's December 23, 2008 VC 35%  DLC0 31%   -Desats  with > 2 laps 06/22/10 so rec wear 02 2lpm at bedtime and with ex  MORBID OBESITY   - Target wt  =  153  for BMI < 30  Hypertension Health Maintenance..........................................................Marland KitchenMarland KitchenFelicity Coyer    - Pneumovax July 22, 2009      - Flu 06/21/2011  Complex med regimen 06/21/2011              Objective:   Physical Exam Massively obese pleasant amb bf nad  wt  305 May 28, 2008 >   302 November 07, 2010 >   303 03/22/2011 > 10/25/2011  299 > 311 04/04/2012  HEENT: nl dentition, turbinates, and orophanx. Nl external ear canals without cough reflex Neck without JVD/Nodes/TM Lungs distant bs, coarse BS w/ no wheezing / no cough on insp RRR no s3 or murmur or increase in P2. no edema Abd soft and benign with nl excursion in the supine position. Ext warm without calf tenderness, cyanosis clubbing        Assessment & Plan:

## 2012-04-04 NOTE — Patient Instructions (Addendum)
Try prednisone 10 mg one half daily to see if affects your ability to walk the mall on 02 or the cough gets worse (walk the mall first for at least before you try)  Always walk for exercise on 3lpm - this will help you burn fat and get into negative calorie balance to lose weight.  Please schedule a follow up visit in 3 months but call sooner if needed

## 2012-04-04 NOTE — Assessment & Plan Note (Addendum)
-  Restart Prednisone 02/24/08   -PFT's December 23, 2008 VC 35%  DLC0 31%   -Desats with > 2 laps 06/22/10 so rec wear 02 2lpm at bedtime and with ex (see chronic resp failure)   The goal with a chronic steroid dependent illness is always arriving at the lowest effective dose that controls the disease/symptoms and not accepting a set "formula" which is based on statistics or guidelines that don't always take into account patient  variability or the natural hx of the dz in every individual patient, which may well vary over time.  For now therefore I recommend the patient maintain  A floor of 5 mg per day if possible

## 2012-04-05 DIAGNOSIS — J9621 Acute and chronic respiratory failure with hypoxia: Secondary | ICD-10-CM | POA: Insufficient documentation

## 2012-04-05 NOTE — Assessment & Plan Note (Signed)
Discussed neg calorie balance and trying minimize prednisone rx.

## 2012-04-05 NOTE — Assessment & Plan Note (Signed)
-   Started on 02 06/22/10   - 04/05/2012  Walked 2lpm x 3 laps @ 185 ft each stopped due to  desat corrected on 3lpm  Her obesity is likely contributing as much to her hypoxemia as ild and needs to walk more consistently to get into neg calorie balance (hopefully made easier by minimizing dose of prednisone)

## 2012-04-30 ENCOUNTER — Other Ambulatory Visit: Payer: Self-pay | Admitting: Internal Medicine

## 2012-04-30 DIAGNOSIS — Z1231 Encounter for screening mammogram for malignant neoplasm of breast: Secondary | ICD-10-CM

## 2012-05-21 ENCOUNTER — Ambulatory Visit
Admission: RE | Admit: 2012-05-21 | Discharge: 2012-05-21 | Disposition: A | Discharge status: Home / Self Care | Payer: BC Managed Care – PPO | Source: Ambulatory Visit | Attending: Internal Medicine | Admitting: Internal Medicine

## 2012-05-21 DIAGNOSIS — Z1231 Encounter for screening mammogram for malignant neoplasm of breast: Secondary | ICD-10-CM

## 2012-06-05 ENCOUNTER — Encounter: Payer: Self-pay | Admitting: Internal Medicine

## 2012-06-05 ENCOUNTER — Telehealth: Payer: Self-pay | Admitting: Internal Medicine

## 2012-06-05 ENCOUNTER — Ambulatory Visit (INDEPENDENT_AMBULATORY_CARE_PROVIDER_SITE_OTHER): Payer: BC Managed Care – PPO | Admitting: Internal Medicine

## 2012-06-05 ENCOUNTER — Ambulatory Visit (INDEPENDENT_AMBULATORY_CARE_PROVIDER_SITE_OTHER)
Admission: RE | Admit: 2012-06-05 | Discharge: 2012-06-05 | Disposition: A | Discharge status: Home / Self Care | Payer: BC Managed Care – PPO | Source: Ambulatory Visit | Attending: Internal Medicine | Admitting: Internal Medicine

## 2012-06-05 VITALS — BP 142/88 | HR 95 | Temp 98.3°F | Resp 20

## 2012-06-05 DIAGNOSIS — J209 Acute bronchitis, unspecified: Secondary | ICD-10-CM

## 2012-06-05 DIAGNOSIS — J9610 Chronic respiratory failure, unspecified whether with hypoxia or hypercapnia: Secondary | ICD-10-CM

## 2012-06-05 DIAGNOSIS — J8410 Pulmonary fibrosis, unspecified: Secondary | ICD-10-CM

## 2012-06-05 MED ORDER — LEVOFLOXACIN 500 MG PO TABS: 500.0000 mg | ORAL_TABLET | Freq: Every day | ORAL | Status: DC

## 2012-06-05 MED ORDER — ALBUTEROL SULFATE HFA 108 (90 BASE) MCG/ACT IN AERS: 2.0000 | INHALATION_SPRAY | RESPIRATORY_TRACT | Status: DC | PRN

## 2012-06-05 MED ORDER — METHYLPREDNISOLONE ACETATE 80 MG/ML IJ SUSP
120.0000 mg | Freq: Once | INTRAMUSCULAR | Status: AC
  Administered 2012-06-05: 120 mg via INTRAMUSCULAR

## 2012-06-05 MED ORDER — PREDNISONE (PAK) 10 MG PO TABS: 10.0000 mg | ORAL_TABLET | ORAL | Status: DC

## 2012-06-05 NOTE — Assessment & Plan Note (Signed)
On O2 related to interstitial lung dz and OHS  Weaning pred since 03/2012 as per pulm - last OV reviewed After burst pulse steroid for acute flare, maintain 10mg  qd without further reduction at this time - pt understands and agress

## 2012-06-05 NOTE — Telephone Encounter (Signed)
Can try chlortrimeton 4 mg every 6 h prn

## 2012-06-05 NOTE — Telephone Encounter (Signed)
Caller: [name]/Patient; Patient Name: [name]; PCP: Rene Paci (Adults only); Best Callback Phone Number: [phone].  Pt. calling to report coughing spasms, laryngitis, and sinus headache.  Onset 06/02/12.  Afebrile today.  Triaged per Upper Respiratory Infection, and The disposition became;  Dry cough following a cold:  Home care.  Disposition upgraded to see Provder within 24 hours due to nursing judgement.  Pt. has Interstitial lung Disease and is on O2, and I was able to hear one of her coughing spasms which lasted 2 minutes, and she was gasping for air for 30 seconds after.  She did recover, and returned to having normal Respiratory Rate, but this writer feels she should be seen today.  No appointments are available today with any provider in the practice.   PLEASE WORK THIS PT. IN TODAY, AS THERE ARE NO AVAILABLE SLOTS.  Home care instructions given.  CAN/db

## 2012-06-05 NOTE — Assessment & Plan Note (Signed)
On chronic O2+ pred - see above

## 2012-06-05 NOTE — Telephone Encounter (Signed)
Pt called back.  Wants to be worked in today.  Will anyone see her today?

## 2012-06-05 NOTE — Telephone Encounter (Signed)
If she comes in NOW i will see her

## 2012-06-05 NOTE — Progress Notes (Signed)
  Subjective:    Patient ID: [name], female    DOB: 1971-02-14, 41 y.o.   MRN: [id number]  HPI complains of cough Onset current symptoms 4 days ago Denies sick contacts at home Progressively worse symptoms despite OTC meds Denies associated with fever, chest pain or new edema associated with yellow sputum, shortness of breath and wheeze, and fatigue Not wearing O2 x 24h because "too sick to pick up from home care supplier"  Past Medical History  Diagnosis Date  . Dysphagia   . Allergic rhinitis   . Unspecified essential hypertension   . Mild depression   . Headache   . Urge incontinence   . Chronic rhinitis   . Dyspnea   . GERD (gastroesophageal reflux disease)   . Cough   . Bronchitis   . ILD (interstitial lung disease)   . Morbid obesity     Review of Systems  Constitutional: Positive for chills, activity change and fatigue. Negative for fever and appetite change.  Respiratory: Positive for cough, choking, chest tightness, shortness of breath and wheezing. Negative for stridor.   Gastrointestinal: Negative for abdominal pain, diarrhea and constipation.  Neurological: Positive for light-headedness. Negative for headaches.       Objective:   Physical Exam  BP 142/88  Pulse 95  Temp 98.3 F (36.8 C) (Oral)  Resp 20  SpO2 86% Constitutional: She morbidly obese, coughing spasms  HENT: Head: Normocephalic and atraumatic - tender over right cheekbone - suspect dental>sinus. Ears: B TMs ok, no erythema or effusion; Nose: Nose normal. Mouth/Throat: Oropharynx is clear and moist. No oropharyngeal exudate. poor dentition Eyes: Conjunctivae and EOM are normal. Pupils are equal, round, and reactive to light. No scleral icterus.  Neck: thick. Normal range of motion. Neck supple. Mild LAD. No JVD present. No thyromegaly present.  Cardiovascular: Normal rate, regular rhythm and normal heart sounds.  No murmur heard. No BLE edema. Pulmonary/Chest: Effort increased with  conversation. Diffusely diminished breath sounds. She has exp wheezes, no stridor.  Neurological: She is alert and oriented to person, place, and time. No cranial nerve deficit. Coordination normal.  Skin: Skin is warm and dry. No rash noted. No erythema.  Psychiatric: She has a normal mood and affect. Her behavior is normal. Judgment and thought content normal.   Lab Results  Component Value Date   WBC 7.1 02/29/2012   HGB 12.3 02/29/2012   HCT 38.7 02/29/2012   PLT 247.0 02/29/2012   GLUCOSE 129* 02/29/2012   CHOL 185 10/31/2010   TRIG 40.0 10/31/2010   HDL 42.10 10/31/2010   LDLCALC 135* 10/31/2010   ALT 23 10/31/2010   AST 18 10/31/2010   NA 137 02/29/2012   K 4.0 02/29/2012   CL 100 02/29/2012   CREATININE 0.6 02/29/2012   BUN 11 02/29/2012   CO2 30 02/29/2012   TSH 2.36 02/29/2012         Assessment & Plan:   Acute bronchitis - tx with high dose steroids, antibiotics and bronchodialators - medrol and alb neb in OV today Check CXR rule out consolidation Reminded of importance of O2 as rx'd - pt agrees to pick up O2 supply along with new meds on way home

## 2012-06-05 NOTE — Patient Instructions (Signed)
It was good to see you today. Medrol steroid shot and nebulizer breathing treatment given to you today in office Chest xray ordered today. Your results will be called to you after review (48-72hours after test completion). If any changes need to be made, you will be notified at that time. Take Levaquin antibiotics daily x 10days - Pred taper (in place of usual prednisone) x 6 days and use Albuterol inhaler as needed for shortness of breath or cough - Your prescription(s) have been submitted to your pharmacy. Please take as directed and contact our office if you believe you are having problem(s) with the medication(s). When done with 6 days pred taper, continue prednisone 10mg  daily (not alternating with 5 mg)

## 2012-06-05 NOTE — Telephone Encounter (Signed)
Patient c/o a dry cough, scratchy throat, chest tightness and possible sinus drainage since Sun., 06/01/2012. She has been scheduled for an appt with MW on Fri., 06/06/2012 but would like recs in the meantime.Pls advise. Allergies  Allergen Reactions  . Penicillins

## 2012-06-05 NOTE — Telephone Encounter (Signed)
Called spoke with patient, she is currently being seen by Dr Felicity Coyer for acute office visit.  Ov w/ MW cancelled for 9.6.13.  Pt will call to schedule follow up appt once she has been seen by PCP.  Will sign off.

## 2012-06-06 ENCOUNTER — Ambulatory Visit: Payer: BC Managed Care – PPO | Admitting: Internal Medicine

## 2012-06-18 ENCOUNTER — Ambulatory Visit: Payer: BC Managed Care – PPO | Admitting: Internal Medicine

## 2012-06-23 ENCOUNTER — Other Ambulatory Visit: Payer: Self-pay | Admitting: General Practice

## 2012-06-23 MED ORDER — TOLTERODINE TARTRATE 2 MG PO TABS: 2.0000 mg | ORAL_TABLET | Freq: Every day | ORAL | Status: DC

## 2012-06-24 ENCOUNTER — Other Ambulatory Visit: Payer: Self-pay | Admitting: Internal Medicine

## 2012-07-03 ENCOUNTER — Telehealth: Payer: Self-pay | Admitting: Internal Medicine

## 2012-07-03 NOTE — Telephone Encounter (Signed)
Called pt and left message x3 to make a follow up apt. No return call back. Sent letter 07/03/12 for pt to call and schd follow up.  °

## 2012-07-29 ENCOUNTER — Encounter: Payer: Self-pay | Admitting: Internal Medicine

## 2012-07-29 ENCOUNTER — Ambulatory Visit (INDEPENDENT_AMBULATORY_CARE_PROVIDER_SITE_OTHER): Payer: BC Managed Care – PPO | Admitting: Internal Medicine

## 2012-07-29 VITALS — BP 118/80 | HR 98 | Temp 98.2°F | Ht 60.0 in | Wt 307.8 lb

## 2012-07-29 DIAGNOSIS — I10 Essential (primary) hypertension: Secondary | ICD-10-CM

## 2012-07-29 DIAGNOSIS — J9610 Chronic respiratory failure, unspecified whether with hypoxia or hypercapnia: Secondary | ICD-10-CM

## 2012-07-29 DIAGNOSIS — Z23 Encounter for immunization: Secondary | ICD-10-CM

## 2012-07-29 MED ORDER — LOSARTAN POTASSIUM 100 MG PO TABS: 100.0000 mg | ORAL_TABLET | Freq: Every day | ORAL | Status: DC

## 2012-07-29 MED ORDER — HYDROCHLOROTHIAZIDE 12.5 MG PO CAPS: 12.5000 mg | ORAL_CAPSULE | Freq: Every day | ORAL | Status: DC | PRN

## 2012-07-29 NOTE — Assessment & Plan Note (Signed)
On O2 related to interstitial lung dz and OHS  Weaning pred since 03/2012 as per pulm - has plateaued at 10mg  qd After burst pulse steroid for acute flare, maintain 10mg  qd without further reduction at this time -

## 2012-07-29 NOTE — Assessment & Plan Note (Signed)
Wt Readings from Last 3 Encounters:  07/29/12 307 lb 12.8 oz (139.617 kg)  04/04/12 311 lb 12.8 oz (141.432 kg)  03/19/12 307 lb (139.254 kg)   The patient is asked to make an attempt to improve diet and exercise patterns to aid in medical management of this problem. She will investigate bariatric options and continue gym workouts with calorie restriction

## 2012-07-29 NOTE — Progress Notes (Signed)
  Subjective:    Patient ID: [name], female    DOB: 09/24/1971, 41 y.o.   MRN: [id number]  HPI   Here for follow up - Reports taking meds as rx'd Denies med or diet changes - no NSAIDs  Past Medical History  Diagnosis Date  . Dysphagia   . Allergic rhinitis   . Unspecified essential hypertension   . Mild depression   . Headache   . Urge incontinence   . Chronic rhinitis   . GERD (gastroesophageal reflux disease)   . Cough   . Bronchitis   . ILD (interstitial lung disease)   . Morbid obesity     Review of Systems  Constitutional: Positive for fatigue. Negative for fever and unexpected weight change.  Respiratory: Negative for cough and shortness of breath.   Cardiovascular: Negative for chest pain, palpitations and leg swelling.       Objective:   Physical Exam  BP 118/80  Pulse 98  Temp 98.2 F (36.8 C) (Oral)  Ht 5' (1.524 m)  Wt 307 lb 12.8 oz (139.617 kg)  BMI 60.11 kg/m2  SpO2 98% Wt Readings from Last 3 Encounters:  07/29/12 307 lb 12.8 oz (139.617 kg)  04/04/12 311 lb 12.8 oz (141.432 kg)  03/19/12 307 lb (139.254 kg)   Constitutional: She is obese, but appears well-developed and well-nourished. No distress.  Eyes: Conjunctivae and EOM are normal. Pupils are equal, round, and reactive to light. No scleral icterus.  Neck: Normal range of motion. Neck supple. No JVD present. No thyromegaly present.  Cardiovascular: Normal rate, regular rhythm and normal heart sounds.  No murmur heard. No BLE edema. Pulmonary/Chest: Effort normal and breath sounds normal. No respiratory distress. She has no wheezes.  Psychiatric: She has a normal mood and affect. Her behavior is normal. Judgment and thought content normal.   Lab Results  Component Value Date   WBC 7.1 02/29/2012   HGB 12.3 02/29/2012   HCT 38.7 02/29/2012   PLT 247.0 02/29/2012   GLUCOSE 129* 02/29/2012   CHOL 185 10/31/2010   TRIG 40.0 10/31/2010   HDL 42.10 10/31/2010   LDLCALC 135* 10/31/2010   ALT  23 10/31/2010   AST 18 10/31/2010   NA 137 02/29/2012   K 4.0 02/29/2012   CL 100 02/29/2012   CREATININE 0.6 02/29/2012   BUN 11 02/29/2012   CO2 30 02/29/2012   TSH 2.36 02/29/2012        Assessment & Plan:  See problem list. Medications and labs reviewed today.

## 2012-07-29 NOTE — Assessment & Plan Note (Signed)
BP Readings from Last 3 Encounters:  07/29/12 118/80  06/05/12 142/88  04/04/12 110/86   Started ARB/hctz 02/29/12 - improved Cramping with diuretic so will change to ARB qd and hctz prn edema

## 2012-07-29 NOTE — Patient Instructions (Addendum)
It was good to see you today. We have reviewed your prior records including labs and tests today Medications reviewed: Change combination blood pressure pills 2 separate components -take losartan 100 mg daily and use diuretic only as needed for fluid buildup - Your prescription(s) have been submitted to your pharmacy. Please take as directed and contact our office if you believe you are having problem(s) with the medication(s). No other changes recommended today Continue to followup with lung specialist as planned Work on lifestyle changes as discussed (low fat, low carb, increased protein diet; improved exercise efforts; weight loss) to control sugar, blood pressure and cholesterol levels and/or reduce risk of developing other medical problems. Look into [email or web address] or other type of food journal to assist you in this process. Please schedule followup in 6 months for blood pressure and weight check, call sooner if problems. Exercise to Lose Weight Exercise and a healthy diet may help you lose weight. Your doctor may suggest specific exercises. EXERCISE IDEAS AND TIPS  Choose low-cost things you enjoy doing, such as walking, bicycling, or exercising to workout videos.   Take stairs instead of the elevator.   Walk during your lunch break.   Park your car further away from work or school.   Go to a gym or an exercise class.   Start with 5 to 10 minutes of exercise each day. Build up to 30 minutes of exercise 4 to 6 days a week.   Wear shoes with good support and comfortable clothes.   Stretch before and after working out.   Work out until you breathe harder and your heart beats faster.   Drink extra water when you exercise.   Do not do so much that you hurt yourself, feel dizzy, or get very short of breath.  Exercises that burn about 150 calories:  Running 1  miles in 15 minutes.   Playing volleyball for 45 to 60 minutes.   Washing and waxing a car for 45 to 60 minutes.    Playing touch football for 45 minutes.   Walking 1  miles in 35 minutes.   Pushing a stroller 1  miles in 30 minutes.   Playing basketball for 30 minutes.   Raking leaves for 30 minutes.   Bicycling 5 miles in 30 minutes.   Walking 2 miles in 30 minutes.   Dancing for 30 minutes.   Shoveling snow for 15 minutes.   Swimming laps for 20 minutes.   Walking up stairs for 15 minutes.   Bicycling 4 miles in 15 minutes.   Gardening for 30 to 45 minutes.   Jumping rope for 15 minutes.   Washing windows or floors for 45 to 60 minutes.  Document Released: 10/20/2010 Document Revised: 12/10/2011 Document Reviewed: 10/20/2010 [facility] Patient Information 2013 [place], [place].

## 2012-08-08 ENCOUNTER — Ambulatory Visit (INDEPENDENT_AMBULATORY_CARE_PROVIDER_SITE_OTHER): Payer: BC Managed Care – PPO | Admitting: Internal Medicine

## 2012-08-08 ENCOUNTER — Encounter: Payer: Self-pay | Admitting: Internal Medicine

## 2012-08-08 VITALS — BP 116/80 | HR 101 | Temp 97.6°F | Ht 60.0 in | Wt 308.0 lb

## 2012-08-08 DIAGNOSIS — R21 Rash and other nonspecific skin eruption: Secondary | ICD-10-CM

## 2012-08-08 DIAGNOSIS — L299 Pruritus, unspecified: Secondary | ICD-10-CM

## 2012-08-08 DIAGNOSIS — W57XXXA Bitten or stung by nonvenomous insect and other nonvenomous arthropods, initial encounter: Secondary | ICD-10-CM

## 2012-08-08 DIAGNOSIS — T148 Other injury of unspecified body region: Secondary | ICD-10-CM

## 2012-08-08 MED ORDER — HYDROXYZINE HCL 10 MG PO TABS: 10.0000 mg | ORAL_TABLET | Freq: Three times a day (TID) | ORAL | Status: DC | PRN

## 2012-08-08 MED ORDER — METHYLPREDNISOLONE ACETATE 80 MG/ML IJ SUSP
120.0000 mg | Freq: Once | INTRAMUSCULAR | Status: AC
  Administered 2012-08-08: 120 mg via INTRAMUSCULAR

## 2012-08-08 MED ORDER — TRIAMCINOLONE ACETONIDE 0.1 % EX CREA: TOPICAL_CREAM | Freq: Two times a day (BID) | CUTANEOUS | Status: DC

## 2012-08-08 MED ORDER — METHYLPREDNISOLONE ACETATE 80 MG/ML IJ SUSP: 120.0000 mg | Freq: Once | INTRAMUSCULAR | Status: DC

## 2012-08-08 NOTE — Progress Notes (Signed)
Subjective:    Patient ID: [name], female    DOB: 07-21-1971, 41 y.o.   MRN: [id number]  HPI  Pt presents today with c/o a rash that started 4 weeks ago. It originally started with 1 bump on her left buttock which was itchy, lasted about 1 week and then resolved on its own. 1 week later, another bump appeared on her right thigh which was itchy and lasted about 1 week then resolved. Pt presents now with a 3 day history of similar bumps on her right arm, which are itchy and mildly painful. Pt has never had anything like this before and is very anxious about it. Pt does have a history of allergies which she takes an antihistamine daily for. She is also on daily prednisone for ILD.  Review of Systems      Past Medical History  Diagnosis Date  . Dysphagia   . Allergic rhinitis   . Unspecified essential hypertension   . Mild depression   . Headache   . Urge incontinence   . Chronic rhinitis   . GERD (gastroesophageal reflux disease)   . Cough   . Bronchitis   . ILD (interstitial lung disease)   . Morbid obesity     Current Outpatient Prescriptions  Medication Sig Dispense Refill  . acetaminophen-codeine (TYLENOL #4) 300-60 MG per tablet 1 to 2 tablets every 4 to 6 hours as needed       . albuterol (PROAIR HFA) 108 (90 BASE) MCG/ACT inhaler Inhale 2 puffs into the lungs every 4 (four) hours as needed for wheezing or shortness of breath.  18 g  1  . cetirizine (ZYRTEC) 10 MG tablet Take 10 mg by mouth daily as needed.      . Cyanocobalamin (B-12 PO) Take 1 tablet by mouth daily.        Marland Kitchen DETROL LA 2 MG 24 hr capsule TAKE 1 CAPSULE BY MOUTH DAILY  30 capsule  10  . dextromethorphan-guaiFENesin (MUCINEX DM) 30-600 MG per 12 hr tablet Take 1 tablet by mouth every 12 (twelve) hours. As needed       . esomeprazole (NEXIUM) 40 MG capsule 1 capsule 30 minutes before first and last meals daily  180 capsule  3  . famotidine (PEPCID AC MAXIMUM STRENGTH) 20 MG tablet Take 1 tablet (20 mg  total) by mouth at bedtime. 1 at bedtime  90 tablet  3  . fluticasone (FLONASE) 50 MCG/ACT nasal spray 2 sprays each nostril in the morning and at bedtime as needed       . hydrochlorothiazide (MICROZIDE) 12.5 MG capsule Take 1 capsule (12.5 mg total) by mouth daily as needed.  30 capsule  1  . losartan (COZAAR) 100 MG tablet Take 1 tablet (100 mg total) by mouth daily.  90 tablet  3  . predniSONE (DELTASONE) 10 MG tablet Take 1 tablet (10 mg total) by mouth daily.  100 tablet  3  . tolterodine (DETROL) 2 MG tablet Take 1 tablet (2 mg total) by mouth daily. 1 tablet daily  90 tablet  1  . hydrOXYzine (ATARAX/VISTARIL) 10 MG tablet Take 1 tablet (10 mg total) by mouth 3 (three) times daily as needed for itching.  30 tablet  0  . methylPREDNISolone acetate (DEPO-MEDROL) 80 MG/ML injection Inject 1.5 mLs (120 mg total) into the muscle once.  5 mL  0  . triamcinolone cream (KENALOG) 0.1 % Apply topically 2 (two) times daily.  30 g  0  Allergies  Allergen Reactions  . Penicillins     Family History  Problem Relation Age of Onset  . Sarcoidosis Mother     dxed by transbrochial bx  . Allergies Mother   . Heart disease Father   . Diabetes Father   . Allergies Father   . Coronary artery disease Father   . Cancer Maternal Grandmother     CA of unknown type  . Cancer Paternal Grandmother     CA of unknown type    History   Social History  . Marital Status: Married    Spouse Name: N/A    Number of Children: 1  . Years of Education: N/A   Occupational History  . 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 Smoking status: Never Smoker   . 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 Alcohol Use: No  . Drug Use: No  . Sexually Active: Not on file   Other Topics Concern  . Not on file   Social History Narrative  . No narrative  on file    Constitutional: Denies fever, malaise, fatigue, headache or abrupt weight changes.  Skin:  Pt reports rash and itching. Denies redness, lesions or ulcercations.     No other specific complaints in a complete review of systems (except as listed in HPI above).  Objective:   Physical Exam   BP 116/80  Pulse 101  Temp 97.6 F (36.4 C) (Oral)  Ht 5' (1.524 m)  Wt 308 lb (139.708 kg)  BMI 60.15 kg/m2  SpO2 97% Wt Readings from Last 3 Encounters:  08/08/12 308 lb (139.708 kg)  07/29/12 307 lb 12.8 oz (139.617 kg)  04/04/12 311 lb 12.8 oz (141.432 kg)    General: Appears her stated age, obese but well developed, well nourished in NAD. Skin: Warm, dry and intact.  4 quarter sides papules with an erythematous base noted on the right upper arm. No drainage noted. No lesions or ulcerations noted. Cardiovascular: Normal rate and rhythm. S1,S2 noted.  No murmur, rubs or gallops noted. No JVD or BLE edema. No carotid bruits noted. Pulmonary/Chest: Normal effort and positive vesicular breath sounds. No respiratory distress. No wheezes, rales or ronchi noted.      Assessment & Plan:   Bed bug bites Rash Itching  Will give 120 mg Depo Medrol IM today eRx sent in for triamcinolone cream eRx given for Atarax 10 mg for itching.  Instructions given on home care for bed bugs, see handout  RTC as needed or if symptoms persist.

## 2012-08-08 NOTE — Patient Instructions (Addendum)
Bedbugs  Bedbugs are tiny bugs that live in and around beds. During the day, they hide in mattresses and other places near beds. They come out at night and bite people lying in bed. They need blood to live and grow. Bedbugs can be found in beds anywhere. Usually, they are found in places where many people come and go (hotels, shelters, hospitals). It does not matter whether the place is dirty or clean.  Getting bitten by bedbugs rarely causes a medical problem. The biggest problem can be getting rid of them. This often takes the work of a pest control expert.  CAUSES   Less use of pesticides. Bedbugs were common before the 1950s. Then, strong pesticides such as DDT nearly wiped them out. Today, these pesticides are not used because they harm the environment and can cause health problems.   More travel. Besides mattresses, bedbugs can also live in clothing and luggage. They can come along as people travel from place to place. Bedbugs are more common in certain parts of the world. When people travel to those areas, the bugs can come home with them.   Presence of birds and bats. Bedbugs often infest birds and bats. If you have these animals in or near your home, bedbugs may infest your house, too.  SYMPTOMS  It does not hurt to be bitten by a bedbug. You will probably not wake up when you are bitten. Bedbugs usually bite areas of the skin that are not covered. Symptoms may show when you wake up, or they may take a day or more to show up. Symptoms may include:   Small red bumps on the skin. These might be lined up in a row or clustered in a group.   A darker red dot in the middle of red bumps.   Blisters on the skin. There may be swelling and very bad itching. These may be signs of an allergic reaction. This does not happen often.  DIAGNOSIS   Bedbug bites might look and feel like other types of insect bites. The bugs do not stay on the body like ticks or lice. They bite, drop off, and crawl away to hide. Your caregiver will probably:   Ask about your symptoms.   Ask about your recent activities and travel.   Check your skin for bedbug bites.   Ask you to check at home for signs of bedbugs. You should look for:   Spots or stains on the bed or nearby. This could be from bedbugs that were crushed or from their eggs or waste.   Bedbugs themselves. They are reddish-brown, oval, and flat. They do not fly. They are about the size of an apple seed.   Places to look for bedbugs include:   Beds. Check mattresses, headboards, box springs, and bed frames.   On drapes and curtains near the bed.   Under carpeting in the bedroom.   Behind electrical outlets.   Behind any wallpaper that is peeling.   Inside luggage.  TREATMENT  Most bedbug bites do not need treatment. They usually go away on their own in a few days. The bites are not dangerous. However, treatment may be needed if you have scratched so much that your skin has become infected. You may also need treatment if you are allergic to bedbug bites. Treatment options include:   A drug that stops swelling and itching (corticosteroid). Usually, a cream is rubbed on the skin. If you have a bad rash, you may be   given a corticosteroid pill.   Oral antihistamines. These are pills to help control itching.   Antibiotic medicines. An antibiotic may be prescribed for infected skin.  HOME CARE INSTRUCTIONS    Take any medicine prescribed by your caregiver for your bites. Follow the directions carefully.   Consider wearing pajamas with long sleeves and pant legs.    Your bedroom may need to be treated. A pest control expert should make sure the bedbugs are gone. You may need to throw away mattresses or luggage. Ask the pest control expert what you can do to keep the bedbugs from coming back. Common suggestions include:   Putting a plastic cover over your mattress.   Washing and drying your clothes and bedding in hot water and a hot dryer. The temperature should be hotter than 120 F (48.9 C). Bedbugs are killed by high temperatures.   Vacuuming carefully all around your bed. Vacuum in all cracks and crevices where the bugs might hide. Do this often.   Carefully checking all used furniture, bedding, or clothes that you bring into your house.   Eliminating bird nests and bat roosts.   If you get bedbug bites when traveling, check all your possessions carefully before bringing them into your house. If you find any bugs on clothes or in your luggage, consider throwing those items away.  SEEK MEDICAL CARE IF:   You have red bug bites that keep coming back.   You have red bug bites that itch badly.   You have bug bites that cause a skin rash.   You have scratch marks that are red and sore.  SEEK IMMEDIATE MEDICAL CARE IF:  You have a fever.  Document Released: 10/20/2010 Document Revised: 12/10/2011 Document Reviewed: 10/20/2010  [employer] Patient Information 2013 [employer].

## 2012-10-23 ENCOUNTER — Other Ambulatory Visit: Payer: Self-pay | Admitting: Internal Medicine

## 2012-11-24 ENCOUNTER — Ambulatory Visit (INDEPENDENT_AMBULATORY_CARE_PROVIDER_SITE_OTHER): Payer: BC Managed Care – PPO | Admitting: Internal Medicine

## 2012-11-24 ENCOUNTER — Encounter: Payer: Self-pay | Admitting: Internal Medicine

## 2012-11-24 VITALS — BP 132/82 | HR 87 | Temp 98.2°F | Wt 304.8 lb

## 2012-11-24 DIAGNOSIS — J849 Interstitial pulmonary disease, unspecified: Secondary | ICD-10-CM

## 2012-11-24 DIAGNOSIS — J209 Acute bronchitis, unspecified: Secondary | ICD-10-CM

## 2012-11-24 DIAGNOSIS — J309 Allergic rhinitis, unspecified: Secondary | ICD-10-CM

## 2012-11-24 MED ORDER — METHYLPREDNISOLONE ACETATE 80 MG/ML IJ SUSP
80.0000 mg | Freq: Once | INTRAMUSCULAR | Status: AC
  Administered 2012-11-24: 80 mg via INTRAMUSCULAR

## 2012-11-24 MED ORDER — HYDROCOD POLST-CHLORPHEN POLST 10-8 MG/5ML PO LQCR: 5.0000 mL | Freq: Two times a day (BID) | ORAL | Status: DC | PRN

## 2012-11-24 MED ORDER — PREDNISONE (PAK) 10 MG PO TABS: 10.0000 mg | ORAL_TABLET | ORAL | Status: DC

## 2012-11-24 MED ORDER — LEVOFLOXACIN 500 MG PO TABS: 500.0000 mg | ORAL_TABLET | Freq: Every day | ORAL | Status: AC

## 2012-11-24 NOTE — Progress Notes (Signed)
  Subjective:    Patient ID: [name], female    DOB: Sep 26, 1971, 42 y.o.   MRN: [id number]  HPI  complains of cough Associated with postnasal drip and nasal congestion Symptoms ongoing greater than 4 days, rapidly worse in past 24 hours Reports compliance with medications as prescribed Denies fever, discoloration of sputum or recent travel  Past Medical History  Diagnosis Date  . Dysphagia   . Allergic rhinitis   . Unspecified essential hypertension   . Mild depression   . Headache   . Urge incontinence   . Chronic rhinitis   . GERD (gastroesophageal reflux disease)   . Cough   . Bronchitis   . ILD (interstitial lung disease)   . Morbid obesity     Review of Systems  Constitutional: Positive for fatigue. Negative for fever and unexpected weight change.  HENT: Positive for congestion, postnasal drip and sinus pressure.   Respiratory: Positive for cough, shortness of breath and wheezing.   Cardiovascular: Negative for chest pain and leg swelling.  Neurological: Positive for weakness and light-headedness.       Objective:   Physical Exam BP 132/82  Pulse 87  Temp(Src) 98.2 F (36.8 C) (Oral)  Wt 304 lb 12.8 oz (138.256 kg)  BMI 59.53 kg/m2  SpO2 99% Wt Readings from Last 3 Encounters:  11/24/12 304 lb 12.8 oz (138.256 kg)  08/08/12 308 lb (139.708 kg)  07/29/12 307 lb 12.8 oz (139.617 kg)   General: Morbidly obese, fatigued-appearing but in no acute distress. Sister at side HEENT: no sinus tenderness to palpation, clear PND evident with mild erythema, no exudate Lungs: marked upper airway wheeze with coarse sounds bilaterally, moderate to good air movement, cough spasms during history and exam noted Cardiovascular: Regular rate and rhythm, no edema  Lab Results  Component Value Date   WBC 7.1 02/29/2012   HGB 12.3 02/29/2012   HCT 38.7 02/29/2012   PLT 247.0 02/29/2012   GLUCOSE 129* 02/29/2012   CHOL 185 10/31/2010   TRIG 40.0 10/31/2010   HDL 42.10 10/31/2010    LDLCALC 135* 10/31/2010   ALT 23 10/31/2010   AST 18 10/31/2010   NA 137 02/29/2012   K 4.0 02/29/2012   CL 100 02/29/2012   CREATININE 0.6 02/29/2012   BUN 11 02/29/2012   CO2 30 02/29/2012   TSH 2.36 02/29/2012       Assessment & Plan:   Acute bronchospasm with bronchitis Interstitial lung disease, exacerbation because of acute illness Chronic allergic sinusitis  IM Medrol 80 mg plus albuterol neb in office today Treat with steroid taper, empiric antibiotics Levaquin and continue beta agonists at home Refill on antitussives as requested -patient reports Tussionex most effective

## 2012-11-24 NOTE — Patient Instructions (Signed)
It was good to see you today. Medrol steroid shot and nebulizer breathing treatment given to you today in office Take Levaquin antibiotics daily x 10days - Pred taper (in place of usual prednisone) x 6 days and use Albuterol inhaler as needed for shortness of breath or cough - Your prescription(s) have been submitted to your pharmacy. Please take as directed and contact our office if you believe you are having problem(s) with the medication(s). When done with 6 days pred taper, continue prednisone 10mg  daily (not alternating with 5 mg)

## 2012-11-25 ENCOUNTER — Telehealth: Payer: Self-pay | Admitting: Internal Medicine

## 2012-11-25 NOTE — Telephone Encounter (Signed)
[facility] Triage Call Report Triage Record Num: [id number] Operator: Roselyn Meier Patient Name: [name] Call Date & Time: 11/24/2012 5:49:28PM Patient Phone: [phone] PCP: Rene Paci Patient Gender: Female PCP Fax : [phone] Patient DOB: Mar 12, 1971 Practice Name: [facility] Reason for Call: Caller: Sheng/Patient; PCP: Rene Paci (Adults only); CB#: [phone]; Call regarding Nausea; Pt reports that she was seen in the office today. Pt was prescribed medications but forgot to ask for medication for nausea because the medications (tussionex) makes her sick. RN verified patient was seen today in the office. Triaged patient per Nausea or Vomiting Protocol. RX standing orders used for "unable to take or keep down prescription medication'. RX of Phenergan 25 mg tablets (1 q4 hours PO PRN, dispense #6) called into [facility] [phone] (message left on voicemail) Protocol(s) Used: Nausea or Vomiting Recommended Outcome per Protocol: Call Provider Immediately Override Outcome if Used in Protocol: Provide Home/Self Care RN Reason for Override Outcome: Rx Standing Orders Used. Reason for Outcome: Unable to take or keep down prescription medication (heart, respiratory, diabetes, thyroid, antibiotics, birth control pills, corticosteroids) because of nausea/vomiting

## 2012-11-25 NOTE — Telephone Encounter (Signed)
Noted ok thanks  ?

## 2012-12-08 ENCOUNTER — Encounter: Payer: Self-pay | Admitting: Internal Medicine

## 2012-12-08 ENCOUNTER — Ambulatory Visit (INDEPENDENT_AMBULATORY_CARE_PROVIDER_SITE_OTHER): Payer: BC Managed Care – PPO | Admitting: Internal Medicine

## 2012-12-08 ENCOUNTER — Ambulatory Visit (INDEPENDENT_AMBULATORY_CARE_PROVIDER_SITE_OTHER): Payer: BC Managed Care – PPO

## 2012-12-08 VITALS — BP 122/80 | HR 97 | Temp 98.2°F | Wt 302.8 lb

## 2012-12-08 DIAGNOSIS — R7309 Other abnormal glucose: Secondary | ICD-10-CM

## 2012-12-08 DIAGNOSIS — I10 Essential (primary) hypertension: Secondary | ICD-10-CM

## 2012-12-08 LAB — BASIC METABOLIC PANEL
Calcium: 9.1 mg/dL (ref 8.4–10.5)
Chloride: 99 mEq/L (ref 96–112)
Creatinine, Ser: 0.6 mg/dL (ref 0.4–1.2)
Sodium: 134 mEq/L — ABNORMAL LOW (ref 135–145)

## 2012-12-08 LAB — LIPID PANEL
Cholesterol: 209 mg/dL — ABNORMAL HIGH (ref 0–200)
Total CHOL/HDL Ratio: 4
Triglycerides: 97 mg/dL (ref 0.0–149.0)

## 2012-12-08 LAB — GLUCOSE, POCT (MANUAL RESULT ENTRY): POC Glucose: 215 mg/dl — AB (ref 70–99)

## 2012-12-08 LAB — CBC WITH DIFFERENTIAL/PLATELET
Eosinophils Relative: 0.4 % (ref 0.0–5.0)
Monocytes Relative: 9.7 % (ref 3.0–12.0)
Neutrophils Relative %: 65 % (ref 43.0–77.0)
Platelets: 217 10*3/uL (ref 150.0–400.0)
WBC: 10.1 10*3/uL (ref 4.5–10.5)

## 2012-12-08 LAB — HEPATIC FUNCTION PANEL
ALT: 62 U/L — ABNORMAL HIGH (ref 0–35)
AST: 34 U/L (ref 0–37)
Albumin: 3.5 g/dL (ref 3.5–5.2)

## 2012-12-08 LAB — HEMOGLOBIN A1C: Hgb A1c MFr Bld: 10 % — ABNORMAL HIGH (ref 4.6–6.5)

## 2012-12-08 NOTE — Assessment & Plan Note (Signed)
On O2 related to interstitial lung dz and OHS  Weaning pred since 03/2012 as per pulm - but plateaued at 10mg  qd After burst pulse steroid for acute flare, maintain 10mg  qd without further reduction at this time -

## 2012-12-08 NOTE — Patient Instructions (Signed)
It was good to see you today. We have reviewed your prior records including labs and tests today Test(s) ordered today. Your results will be released to MyChart (or called to you) after review, usually within 72hours after test completion. If any changes need to be made, you will be notified at that same time. Medication changes to depend on the results of these tests. Work on lifestyle changes as discussed (low fat, low carb, increased protein diet; improved exercise efforts; weight loss) to control sugar, blood pressure and cholesterol levels and/or reduce risk of developing other medical problems. Look into [email or web address] or other type of food journal to assist you in this process. Please schedule followup in 3 months for sugar, blood pressure and weight check, call sooner if problems. Exercise to Lose Weight Exercise and a healthy diet may help you lose weight. Your doctor may suggest specific exercises. EXERCISE IDEAS AND TIPS  Choose low-cost things you enjoy doing, such as walking, bicycling, or exercising to workout videos.   Take stairs instead of the elevator.   Walk during your lunch break.   Park your car further away from work or school.   Go to a gym or an exercise class.   Start with 5 to 10 minutes of exercise each day. Build up to 30 minutes of exercise 4 to 6 days a week.   Wear shoes with good support and comfortable clothes.   Stretch before and after working out.   Work out until you breathe harder and your heart beats faster.   Drink extra water when you exercise.   Do not do so much that you hurt yourself, feel dizzy, or get very short of breath.  Exercises that burn about 150 calories:  Running 1  miles in 15 minutes.   Playing volleyball for 45 to 60 minutes.   Washing and waxing a car for 45 to 60 minutes.   Playing touch football for 45 minutes.   Walking 1  miles in 35 minutes.   Pushing a stroller 1  miles in 30 minutes.   Playing  basketball for 30 minutes.   Raking leaves for 30 minutes.   Bicycling 5 miles in 30 minutes.   Walking 2 miles in 30 minutes.   Dancing for 30 minutes.   Shoveling snow for 15 minutes.   Swimming laps for 20 minutes.   Walking up stairs for 15 minutes.   Bicycling 4 miles in 15 minutes.   Gardening for 30 to 45 minutes.   Jumping rope for 15 minutes.   Washing windows or floors for 45 to 60 minutes.  Document Released: 10/20/2010 Document Revised: 12/10/2011 Document Reviewed: 10/20/2010 [facility] Patient Information 2013 [place], [place].

## 2012-12-08 NOTE — Progress Notes (Signed)
  Subjective:    Patient ID: [name], female    DOB: 07/29/1971, 42 y.o.   MRN: [id number]  HPI  Reports hypoglycemia: CBGs 200s to 350 in past week (checked using family members glucometer) Reports association with increased urination, blurring of vision Recent burst of prednisone reviewed for treatment of chronic pulmonary disease, but has been maintained on prednisone 10 mg daily for past 10 months  Past Medical History  Diagnosis Date  . Dysphagia   . Allergic rhinitis   . Unspecified essential hypertension   . Mild depression   . Headache   . Urge incontinence   . Chronic rhinitis   . GERD (gastroesophageal reflux disease)   . Cough   . Bronchitis   . ILD (interstitial lung disease)   . Morbid obesity     Review of Systems  Constitutional: Positive for fatigue. Negative for fever and unexpected weight change.  Respiratory: Positive for shortness of breath (chronic). Negative for cough and wheezing.   Cardiovascular: Negative for chest pain and leg swelling.  Gastrointestinal: Negative for nausea, vomiting, abdominal pain and diarrhea.  Neurological: Negative for seizures, numbness and headaches.       Objective:   Physical Exam BP 122/80  Pulse 97  Temp(Src) 98.2 F (36.8 C) (Oral)  Wt 302 lb 12.8 oz (137.349 kg)  BMI 59.14 kg/m2  SpO2 99% Wt Readings from Last 3 Encounters:  12/08/12 302 lb 12.8 oz (137.349 kg)  11/24/12 304 lb 12.8 oz (138.256 kg)  08/08/12 308 lb (139.708 kg)   Constitutional: She is obese, but appears well-developed and well-nourished. No distress.  Neck: Thick. Normal range of motion. Neck supple. No JVD present. No thyromegaly present.  Cardiovascular: Normal rate, regular rhythm and normal heart sounds.  No murmur heard. No BLE edema. Pulmonary/Chest: Effort normal and breath sounds normal. No respiratory distress. She has no wheezes.  Abdominal: Soft. Bowel sounds are normal. She exhibits no distension. There is no tenderness. no  masses Psychiatric: She has a mildly anxious mood and affect. Her behavior is normal. Judgment and thought content normal.   Lab Results  Component Value Date   WBC 7.1 02/29/2012   HGB 12.3 02/29/2012   HCT 38.7 02/29/2012   PLT 247.0 02/29/2012   GLUCOSE 129* 02/29/2012   CHOL 185 10/31/2010   TRIG 40.0 10/31/2010   HDL 42.10 10/31/2010   LDLCALC 135* 10/31/2010   ALT 23 10/31/2010   AST 18 10/31/2010   NA 137 02/29/2012   K 4.0 02/29/2012   CL 100 02/29/2012   CREATININE 0.6 02/29/2012   BUN 11 02/29/2012   CO2 30 02/29/2012   TSH 2.36 02/29/2012       Assessment & Plan:    Hyperglycemia - suspect DM, ?related to recent/freq burst steroids for ILD vs long standing dz from chronic pred and other risk factors (MO, FH, inactive lifestyle with poor eating habits)  Check a1c and labs now tx to depend on results

## 2012-12-08 NOTE — Assessment & Plan Note (Signed)
Wt Readings from Last 3 Encounters:  12/08/12 302 lb 12.8 oz (137.349 kg)  11/24/12 304 lb 12.8 oz (138.256 kg)  08/08/12 308 lb (139.708 kg)   The patient is asked to make an attempt to improve diet and exercise patterns to aid in medical management of this problem. She will investigate bariatric options and continue gym workouts with calorie restriction

## 2012-12-08 NOTE — Progress Notes (Signed)
Subjective:    Patient ID: [name], female    DOB: Jul 30, 1971, 42 y.o.   MRN: [id number]  HPI CC of elevated blood glucose. The patient states she began taking her blood sugar daily when she noticed herself urinating every hour starting on 11/30/12.  The patient was prescribed prednisone on 2/24 and finished the course on 3/6. She is on prednisone 10 mg daily.  She is unsure if the medication is causing the elevated blood glucose.  Polyuria and polydipsia present, but denies polyphagia. She is also experiencing decreased energy and lightheadedness. The patient is very concerned about this problem and wants it "taken care of". She did take 1 of her husband's metformin 500mg   pills and experienced diarrhea afterwards.   Past Medical History  Diagnosis Date  . Dysphagia   . Allergic rhinitis   . Unspecified essential hypertension   . Mild depression   . Headache   . Urge incontinence   . Chronic rhinitis   . GERD (gastroesophageal reflux disease)   . Cough   . Bronchitis   . ILD (interstitial lung disease)   . Morbid obesity    Family History  Problem Relation Age of Onset  . Sarcoidosis Mother     dxed by transbrochial bx  . Allergies Mother   . Heart disease Father   . Diabetes Father   . Allergies Father   . Coronary artery disease Father   . Cancer Maternal Grandmother     CA of unknown type  . Cancer Paternal Grandmother     CA of unknown type   Review of Systems  Constitutional: Positive for fatigue. Negative for fever and unexpected weight change.  Respiratory: Negative for cough, chest tightness and shortness of breath.   Cardiovascular: Positive for palpitations ("flutter"). Negative for chest pain.  Gastrointestinal: Negative for nausea, vomiting, diarrhea and constipation.  Endocrine: Positive for polydipsia and polyuria. Negative for polyphagia.  Genitourinary: Positive for frequency.  Musculoskeletal: Positive for myalgias (cramping in legs).        Objective:   Physical Exam  Constitutional: She is oriented to person, place, and time. She appears well-developed and well-nourished. No distress.  HENT:  Head: Normocephalic and atraumatic.  Neck: Normal range of motion. Neck supple. No JVD present. No tracheal deviation present.  No acanthosis noted.  Cardiovascular: Normal rate, regular rhythm and normal heart sounds.  Exam reveals no gallop and no friction rub.   No murmur heard. Pulmonary/Chest: Effort normal and breath sounds normal. No respiratory distress. She has no wheezes.  Neurological: She is alert and oriented to person, place, and time.  Skin: Skin is warm and dry. No rash noted. No erythema.  Psychiatric: She has a normal mood and affect. Her behavior is normal. Judgment and thought content normal.   Filed Vitals:   12/08/12 1211  BP: 122/80  Pulse: 97  Temp: 98.2 F (36.8 C)   Lab Results  Component Value Date   WBC 7.1 02/29/2012   HGB 12.3 02/29/2012   HCT 38.7 02/29/2012   PLT 247.0 02/29/2012   GLUCOSE 129* 02/29/2012   CHOL 185 10/31/2010   TRIG 40.0 10/31/2010   HDL 42.10 10/31/2010   LDLCALC 135* 10/31/2010   ALT 23 10/31/2010   AST 18 10/31/2010   NA 137 02/29/2012   K 4.0 02/29/2012   CL 100 02/29/2012   CREATININE 0.6 02/29/2012   BUN 11 02/29/2012   CO2 30 02/29/2012   TSH 2.36 02/29/2012  Assessment & Plan:  Elevated blood glucose-  Labs today: A1C, BMP, CBC, LFT, and TSH. Results of these lab tests will dictate treatment.  Patient to return in 3 months for follow up on this and other chronic medical conditions.

## 2012-12-08 NOTE — Assessment & Plan Note (Signed)
BP Readings from Last 3 Encounters:  12/08/12 122/80  11/24/12 132/82  08/08/12 116/80   Started ARB/hctz 02/29/12 - improved Cramping with diuretic so changed to ARB qd with hctz prn edema

## 2012-12-09 ENCOUNTER — Encounter: Payer: Self-pay | Admitting: Internal Medicine

## 2012-12-09 DIAGNOSIS — E119 Type 2 diabetes mellitus without complications: Secondary | ICD-10-CM | POA: Insufficient documentation

## 2012-12-09 DIAGNOSIS — E1169 Type 2 diabetes mellitus with other specified complication: Secondary | ICD-10-CM | POA: Insufficient documentation

## 2012-12-09 LAB — LDL CHOLESTEROL, DIRECT: Direct LDL: 153.1 mg/dL

## 2012-12-09 LAB — TSH: TSH: 1.54 u[IU]/mL (ref 0.35–5.50)

## 2012-12-09 MED ORDER — SITAGLIPTIN PHOS-METFORMIN HCL 50-500 MG PO TABS: 1.0000 | ORAL_TABLET | Freq: Two times a day (BID) | ORAL | Status: DC

## 2012-12-09 MED ORDER — ATORVASTATIN CALCIUM 20 MG PO TABS: 20.0000 mg | ORAL_TABLET | Freq: Every day | ORAL | Status: DC

## 2012-12-09 NOTE — Addendum Note (Signed)
Addended by: Rene Paci A on: 12/09/2012 05:58 PM   Modules accepted: Orders

## 2012-12-10 ENCOUNTER — Other Ambulatory Visit: Payer: Self-pay | Admitting: *Deleted

## 2012-12-10 MED ORDER — ONETOUCH BASIC SYSTEM W/DEVICE KIT: PACK | Status: DC

## 2012-12-10 MED ORDER — ONETOUCH LANCETS MISC: Status: DC

## 2012-12-10 MED ORDER — GLUCOSE BLOOD VI STRP: ORAL_STRIP | Status: DC

## 2012-12-10 NOTE — Telephone Encounter (Signed)
Notified pt with lab results. Pt is needing BS monitor & supplies as well. Sending to pharmacy...Raechel Chute

## 2012-12-11 ENCOUNTER — Other Ambulatory Visit: Payer: Self-pay | Admitting: *Deleted

## 2012-12-11 MED ORDER — ONETOUCH ULTRA MINI W/DEVICE KIT: PACK | Status: DC

## 2012-12-11 NOTE — Telephone Encounter (Signed)
Received fax stating one touch basic monitor are not available. Can we switch to one touch mini meter. Sending new BS monitor...Raechel Chute

## 2013-01-26 ENCOUNTER — Other Ambulatory Visit: Payer: Self-pay | Admitting: Internal Medicine

## 2013-01-28 ENCOUNTER — Ambulatory Visit (INDEPENDENT_AMBULATORY_CARE_PROVIDER_SITE_OTHER): Payer: BC Managed Care – PPO | Admitting: Internal Medicine

## 2013-01-28 ENCOUNTER — Encounter: Payer: Self-pay | Admitting: Internal Medicine

## 2013-01-28 ENCOUNTER — Other Ambulatory Visit (INDEPENDENT_AMBULATORY_CARE_PROVIDER_SITE_OTHER): Payer: BC Managed Care – PPO

## 2013-01-28 VITALS — BP 112/82 | HR 77 | Temp 97.9°F | Wt 304.6 lb

## 2013-01-28 DIAGNOSIS — I10 Essential (primary) hypertension: Secondary | ICD-10-CM

## 2013-01-28 DIAGNOSIS — E785 Hyperlipidemia, unspecified: Secondary | ICD-10-CM

## 2013-01-28 LAB — HEMOGLOBIN A1C: Hgb A1c MFr Bld: 9.1 % — ABNORMAL HIGH (ref 4.6–6.5)

## 2013-01-28 NOTE — Progress Notes (Signed)
  Subjective:    Patient ID: [name], female    DOB: 02-28-1971, 42 y.o.   MRN: [id number]  HPI  42 y.o. BF presents to the office today for follow-up of chronic conditions  Diabetes:  Diagnosed March 2014, exacerbated by the frequent use of steroids for her chronic respiratory failure.  Was started on Januvamet.  Reports she is doing well, morning CBG between 100-130.  Checks almost every morning and occasionally in the evening.  Intermittent loose stools from the medicine.  Hypertension:  States that she is doing well.  No adverse effects from the medication.   Review of Systems  Constitutional: Negative for fever, chills, appetite change and fatigue.  Eyes: Negative for pain.  Endocrine: Negative for polydipsia, polyphagia and polyuria.  Genitourinary: Negative for frequency.  Neurological: Negative for weakness, numbness and headaches.  All other systems reviewed and are negative.       Objective:   Physical Exam  Nursing note and vitals reviewed. Constitutional: She is oriented to person, place, and time. She appears well-developed and well-nourished.  Obese Female  HENT:  Head: Normocephalic and atraumatic.  Eyes: Conjunctivae are normal. Right eye exhibits no discharge. Left eye exhibits no discharge. No scleral icterus.  Neck: Normal range of motion. Neck supple.  Cardiovascular: Normal rate, regular rhythm and normal heart sounds.  Exam reveals no gallop and no friction rub.   No murmur heard. Pulmonary/Chest: Effort normal and breath sounds normal. No respiratory distress. She has no wheezes. She has no rales. She exhibits no tenderness.  Musculoskeletal: Normal range of motion.  Neurological: She is alert and oriented to person, place, and time.  Skin: Skin is warm and dry.  No acanthosis noted  Psychiatric: She has a normal mood and affect. Her behavior is normal.   Filed Vitals:   01/28/13 1311  BP: 112/82  Pulse: 77  Temp: 97.9 F (36.6 C)  TempSrc:  Oral  Weight: 304 lb 9.6 oz (138.166 kg)  SpO2: 95%   Filed Weights   01/28/13 1311  Weight: 304 lb 9.6 oz (138.166 kg)        Assessment & Plan:  42 y.o. Morbidly obese women presents to the office for follow-up for chronic medical issues  Diabetes:  Check A1C, continue diabetic diet, and exercise  HTN:  Continue taking medications as prescribed.  Patient to return in 3 months for follow-up.    I have personally reviewed this case with PA student. I also personally examined this patient. I agree with history and findings as documented above. I reviewed, discussed and approve of the assessment and plan as listed above. Rene Paci, MD

## 2013-01-28 NOTE — Patient Instructions (Signed)
It was good to see you today. Test(s) ordered today. Your results will be released to MyChart (or called to you) after review, usually within 72hours after test completion. If any changes need to be made, you will be notified at that same time. Medications reviewed and updated, no changes recommended at this time. we'll make referral to eye doctor. Our office will contact you regarding appointment(s) once made. Please schedule followup in 3 months for diabetes mellitus and weight check, call sooner if problems.

## 2013-01-28 NOTE — Assessment & Plan Note (Signed)
BP Readings from Last 3 Encounters:  01/28/13 112/82  12/08/12 122/80  11/24/12 132/82   Started ARB/hctz 02/29/12 - improved Cramping with diuretic so changed to ARB qd with hctz prn edema

## 2013-01-28 NOTE — Assessment & Plan Note (Signed)
New dx 11/2012 - a1c 10.0 Started Janumet for same - doing well Recheck a1c now Refer for optho eval - continue ARB and statin follow up 3 months, sooner if problems  Lab Results  Component Value Date   HGBA1C 10.0* 12/08/2012

## 2013-01-28 NOTE — Progress Notes (Signed)
  Subjective:    Patient ID: [name], female    DOB: March 30, 1971, 42 y.o.   MRN: [id number]  HPI  Here for follow up - diabetes mellitus 2 -new diagnosis March 2014 - exacerbated by frequent prednisone use. Started on Janumet for same - taking CBGs 2x/day, reports range 100-130  Past Medical History  Diagnosis Date  . Dysphagia   . Allergic rhinitis   . Unspecified essential hypertension   . Mild depression   . Headache   . Urge incontinence   . Chronic rhinitis   . GERD (gastroesophageal reflux disease)   . Cough   . Bronchitis   . ILD (interstitial lung disease)   . Morbid obesity   . Type II or diabetes mellitus, uncontrolled 11/2012 dx    Dx 12/08/12: a1c 10.0  . Dyslipidemia     Review of Systems  Constitutional: Positive for fatigue. Negative for fever and unexpected weight change.  Respiratory: Positive for shortness of breath (chronic). Negative for cough and wheezing.   Cardiovascular: Negative for chest pain and leg swelling.  Gastrointestinal: Negative for nausea, vomiting, abdominal pain and diarrhea.  Neurological: Negative for seizures, numbness and headaches.       Objective:   Physical Exam  BP 112/82  Pulse 77  Temp(Src) 97.9 F (36.6 C) (Oral)  Wt 304 lb 9.6 oz (138.166 kg)  BMI 59.49 kg/m2  SpO2 95% Wt Readings from Last 3 Encounters:  01/28/13 304 lb 9.6 oz (138.166 kg)  12/08/12 302 lb 12.8 oz (137.349 kg)  11/24/12 304 lb 12.8 oz (138.256 kg)   Constitutional: She is obese, but appears well-developed and well-nourished. No distress.  Neck: Thick. Normal range of motion. Neck supple. No JVD present. No thyromegaly present.  Cardiovascular: Normal rate, regular rhythm and normal heart sounds.  No murmur heard. No BLE edema. Pulmonary/Chest: Effort normal and breath sounds normal. No respiratory distress. She has no wheezes.  Abdominal: Soft. Bowel sounds are normal. She exhibits no distension. There is no tenderness. no masses Psychiatric:  She has a mildly anxious mood and affect. Her behavior is normal. Judgment and thought content normal.   Lab Results  Component Value Date   WBC 10.1 12/08/2012   HGB 13.1 12/08/2012   HCT 40.4 12/08/2012   PLT 217.0 12/08/2012   GLUCOSE 229* 12/08/2012   CHOL 209* 12/08/2012   TRIG 97.0 12/08/2012   HDL 48.50 12/08/2012   LDLDIRECT 153.1 12/08/2012   LDLCALC 135* 10/31/2010   ALT 62* 12/08/2012   AST 34 12/08/2012   NA 134* 12/08/2012   K 4.4 12/08/2012   CL 99 12/08/2012   CREATININE 0.6 12/08/2012   BUN 13 12/08/2012   CO2 26 12/08/2012   TSH 1.54 12/08/2012   HGBA1C 10.0* 12/08/2012       Assessment & Plan:   See problem list. Medications and labs reviewed today.

## 2013-01-28 NOTE — Assessment & Plan Note (Signed)
On statin - Recheck q6-12 mo, titrate as needed for LDL <100 

## 2013-01-29 LAB — HM DIABETES EYE EXAM

## 2013-01-29 MED ORDER — PREDNISONE 10 MG PO TABS: 10.0000 mg | ORAL_TABLET | Freq: Every day | ORAL | Status: DC

## 2013-02-02 ENCOUNTER — Telehealth: Payer: Self-pay | Admitting: Internal Medicine

## 2013-02-02 MED ORDER — AZITHROMYCIN 250 MG PO TABS: ORAL_TABLET | ORAL | Status: DC

## 2013-02-02 NOTE — Telephone Encounter (Signed)
Patient Information:  Caller Name: Truth  Phone: [phone]  Patient: [name]  Gender: Female  DOB: 21-Mar-1971  Age: 42 Years  PCP: Rene Paci (Adults only)  Pregnant: No  Office Follow Up:  Does the office need to follow up with this patient?: Yes  Instructions For The Office: Patient needs to be seen today, Hx Pulmonary Fibrosis and is coughing . Placed on Doxycycline but not better.  PLEASE CONTACT PATIENT  RN Note:  Patient is currently on 02 for Pulmonary Fibrosis.  ongoing coughing and hoarse voice.  Symptoms  Reason For Call & Symptoms: Patient states she is having coughing, head congestion, sore throat, hoarse voice, ears itching. She was seen at [facility] on Saturday. She was given Doxycycline. Yesterday 02/01/13 she is feeling worse.  More coughing unable to sleep. She wants a Zpack. She sounds short of breath with conversation, wears 02 2L n/C.  Reports she does not need ER/911.  Reviewed Health History In EMR: Yes  Reviewed Medications In EMR: Yes  Reviewed Allergies In EMR: Yes  Reviewed Surgeries / Procedures: Yes  Date of Onset of Symptoms: 01/31/2013  Treatments Tried: Doxycycline, Tylenol, Delsym , lozenges, She reports she is on oxygen for Lung disease intersitial - Pulmonary Fibrosis  Treatments Tried Worked: No OB / GYN:  LMP: Unknown  Guideline(s) Used:  Cough  Disposition Per Guideline:   See Today in Office  Reason For Disposition Reached:   Known COPD or other severe lung disease (i.e., bronchiectasis, cystic fibrosis, lung surgery) and worsening symptoms (i.e., increased sputum purulence or amount, increased breathing difficulty)  Advice Given:  Cough Medicines:  OTC Cough Drops: Cough drops can help a lot, especially for mild coughs. They reduce coughing by soothing your irritated throat and removing that tickle sensation in the back of the throat. Cough drops also have the advantage of portability - you can carry them with you.  Coughing Spasms:  Drink warm fluids. Inhale warm mist (Reason: both relax the airway and loosen up the phlegm).  Suck on cough drops or hard candy to coat the irritated throat.  Prevent Dehydration:  Drink adequate liquids.  This will help soothe an irritated or dry throat and loosen up the phlegm.  Call Back If:  Difficulty breathing  You become worse.  Patient Will Follow Care Advice:  YES

## 2013-02-02 NOTE — Telephone Encounter (Signed)
Z-Pak done - erx Office visit or emergency room if symptoms unimproved

## 2013-02-02 NOTE — Telephone Encounter (Signed)
Pt informed of rx for Zpak and MD's advisement.

## 2013-02-03 ENCOUNTER — Encounter: Payer: Self-pay | Admitting: Internal Medicine

## 2013-02-05 ENCOUNTER — Ambulatory Visit: Payer: BC Managed Care – PPO | Admitting: Internal Medicine

## 2013-03-06 ENCOUNTER — Ambulatory Visit: Payer: BC Managed Care – PPO | Admitting: Internal Medicine

## 2013-03-17 ENCOUNTER — Ambulatory Visit (INDEPENDENT_AMBULATORY_CARE_PROVIDER_SITE_OTHER): Payer: BC Managed Care – PPO | Admitting: Internal Medicine

## 2013-03-17 ENCOUNTER — Encounter: Payer: Self-pay | Admitting: Internal Medicine

## 2013-03-17 VITALS — BP 130/84 | HR 99 | Temp 97.2°F | Ht 59.5 in | Wt 306.0 lb

## 2013-03-17 DIAGNOSIS — J8410 Pulmonary fibrosis, unspecified: Secondary | ICD-10-CM

## 2013-03-17 DIAGNOSIS — J9610 Chronic respiratory failure, unspecified whether with hypoxia or hypercapnia: Secondary | ICD-10-CM

## 2013-03-17 NOTE — Assessment & Plan Note (Addendum)
-   Started on 02 06/22/10   - 04/05/2012  Walked 2lpm x 3 laps @ 185 ft each stopped due to  desat corrected on 3lpm so rec 3lpm with exertion  Needs to remember to use the 3lpm with exertion to help burn fat but ok at rest and adl's on RA

## 2013-03-17 NOTE — Patient Instructions (Addendum)
See calendar for specific medication instructions and bring it back for each and every office visit for every healthcare provider you see.  Without it,  you may not receive the best quality medical care that we feel you deserve.  You will note that the calendar groups together  your maintenance  medications that are timed at particular times of the day.  Think of this as your checklist for what your doctor has instructed you to do until your next evaluation to see what benefit  there is  to staying on a consistent group of medications intended to keep you well.  The other group at the bottom is entirely up to you to use as you see fit  for specific symptoms that may arise between visits that require you to treat them on an as needed basis.  Think of this as your action plan or "what if" list.   Separating the top medications from the bottom group is fundamental to providing you adequate care going forward.    Prednisone ceiling is 20 mg per day and floor is 10 mg per day   Wear 02 3lpm with exercise to help burn fat  See Tammy NP in 3 months with all your medications, even over the counter meds, separated in two separate bags, the ones you take no matter what vs the ones you stop once you feel better and take only as needed when you feel you need them.   Tammy  will generate for you a new user friendly medication calendar that will put [place] all on the same page re: your medication use.      Marland Kitchen

## 2013-03-17 NOTE — Progress Notes (Signed)
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  Since onset waxing/waning sx requiring intermittent prednisone tapers with only minimal improvement - most of her symptoms chronically have been related to obesity with dyspnea on exertion and tendency to chronic cough which is felt to be at least partly  reflux related.     November 07, 2010 ov cc cough/ strangling  a bit more with taper off reglan (actually worse before ran out of the low dose reglan)  no excess mucus or increase sob on prednisone @  10 mg daily .  Using med calendar well rec 1)  Ok to resume water aerobics but pace yourself 2)  Wait until your swallowing evaulation is complete before making a decision re reglan > stopped it on her own.  3)  Try Prednisone 10 mg one-half each am   04/04/2012 f/u ov/Jalyne Brodzinski no longer using med calendar on Pred 10 mg one daily  added hyzaar to rx for hbp/leg swelling not back to mall walking yet,  No unusual cough, purulent sputum or sinus/hb symptoms on present rx. No variability to symptoms or need for inhaler. Rec Try prednisone 10 mg one half daily to see if affects your ability to walk the mall on 02 or the cough gets worse (walk the mall first for at least before you try)  Always walk for exercise on 3lpm - this will help you burn fat and get into negative calorie balance to lose weight.  03/17/2013 f/u ov/Maloree Uplinger re NSIP on floor of 10 mg per day Chief Complaint  Patient presents with  . Follow-up    Breathing is overall doing well and she denies any new co's today.   ex on a treadmill x 42m x 3 x daily on 2lpm not the 3lpm as rec  No obvious daytime variabilty or assoc chronic cough or cp or chest tightness, subjective wheeze overt sinus or hb symptoms. No unusual exp hx or h/o childhood pna/ asthma or knowledge of premature birth.      Sleeping ok without nocturnal  or early am exacerbation  of respiratory  c/o's . Also denies any obvious fluctuation of symptoms with weather or environmental changes or other aggravating or alleviating factors except as outlined above.     Current Medications, Allergies, Past Medical History, Past Surgical History, Family History, and Social History were reviewed in [employer] record.  ROS  The following are not active complaints unless bolded sore throat, dysphagia, dental problems, itching, sneezing,  nasal congestion or excess/ purulent secretions, ear ache,   fever, chills, sweats, unintended wt loss, pleuritic or exertional cp, hemoptysis,  orthopnea pnd or leg swelling, presyncope, palpitations, heartburn, abdominal pain, anorexia, nausea, vomiting, diarrhea  or change in bowel or urinary habits, change in stools or urine, dysuria,hematuria,  rash, arthralgias, visual complaints, headache, numbness weakness or ataxia or problems with walking or coordination,  change in mood/affect or memory.                 Past Medical History: GASTROESOPHAGEAL REFLUX DISEASE     - Taper reglan to 10 mg one half twice daily June 22, 2010 > d/c'd 11/2010  -restarted reglan 03/2011 >>unable to tolerate.  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)   -  Restart Prednisone 02/24/08   -PFT's December 23, 2008 VC 35%  DLC0 31%   -Desats with > 2 laps 06/22/10 so rec wear 02 2lpm at bedtime and with ex  MORBID OBESITY   - Target wt  =  153  for BMI < 30  Hypertension Health Maintenance..........................................................Marland KitchenMarland KitchenFelicity Coyer    - Pneumovax July 22, 2009      - Flu 06/21/2011  Complex med regimen 06/21/2011              Objective:   Physical Exam Massively obese pleasant amb bf nad  wt  305 May 28, 2008 >   302 November 07, 2010 >   303 03/22/2011 >  10/25/2011  299 > 311 04/04/2012 > 306 03/17/2013  HEENT: nl dentition, turbinates, and orophanx. Nl external ear canals without cough reflex Neck without JVD/Nodes/TM Lungs distant bs,  Distant BS w/ no wheezing / no cough on insp RRR no s3 or murmur or increase in P2. no edema Abd soft and benign with nl excursion in the supine position. Ext warm without calf tenderness, cyanosis clubbing        Assessment & Plan:

## 2013-03-23 ENCOUNTER — Ambulatory Visit: Payer: BC Managed Care – PPO | Admitting: Internal Medicine

## 2013-04-09 ENCOUNTER — Encounter: Payer: BC Managed Care – PPO | Admitting: Adult Health

## 2013-04-29 ENCOUNTER — Telehealth: Payer: Self-pay | Admitting: *Deleted

## 2013-04-29 ENCOUNTER — Ambulatory Visit (INDEPENDENT_AMBULATORY_CARE_PROVIDER_SITE_OTHER): Payer: BC Managed Care – PPO | Admitting: Internal Medicine

## 2013-04-29 ENCOUNTER — Other Ambulatory Visit (INDEPENDENT_AMBULATORY_CARE_PROVIDER_SITE_OTHER): Payer: BC Managed Care – PPO

## 2013-04-29 ENCOUNTER — Encounter: Payer: Self-pay | Admitting: Internal Medicine

## 2013-04-29 ENCOUNTER — Ambulatory Visit (INDEPENDENT_AMBULATORY_CARE_PROVIDER_SITE_OTHER)
Admission: RE | Admit: 2013-04-29 | Discharge: 2013-04-29 | Disposition: A | Discharge status: Home / Self Care | Payer: BC Managed Care – PPO | Source: Ambulatory Visit | Attending: Internal Medicine | Admitting: Internal Medicine

## 2013-04-29 VITALS — BP 122/84 | HR 95 | Temp 98.2°F | Wt 304.0 lb

## 2013-04-29 DIAGNOSIS — M25579 Pain in unspecified ankle and joints of unspecified foot: Secondary | ICD-10-CM

## 2013-04-29 DIAGNOSIS — IMO0001 Reserved for inherently not codable concepts without codable children: Secondary | ICD-10-CM

## 2013-04-29 DIAGNOSIS — M25572 Pain in left ankle and joints of left foot: Secondary | ICD-10-CM

## 2013-04-29 LAB — HEMOGLOBIN A1C: Hgb A1c MFr Bld: 9.2 % — ABNORMAL HIGH (ref 4.6–6.5)

## 2013-04-29 MED ORDER — SITAGLIPTIN PHOSPHATE 100 MG PO TABS: 100.0000 mg | ORAL_TABLET | Freq: Every day | ORAL | Status: DC

## 2013-04-29 MED ORDER — METFORMIN HCL ER (OSM) 1000 MG PO TB24: 1000.0000 mg | ORAL_TABLET | Freq: Every day | ORAL | Status: DC

## 2013-04-29 MED ORDER — SITAGLIP PHOS-METFORMIN HCL ER 100-1000 MG PO TB24: 1.0000 | ORAL_TABLET | Freq: Every day | ORAL | Status: DC

## 2013-04-29 NOTE — Progress Notes (Signed)
Subjective:    Patient ID: [name], female    DOB: Apr 05, 1971, 42 y.o.   MRN: [id number]  HPI  Here for follow up - diabetes mellitus 2 -new diagnosis March 2014 - exacerbated by frequent prednisone use. Started on Janumet for same, but only taking qAM - taking CBGs 2x/day, reports range 100-130  Also complains of 2 mo lateral L ankle pain - no injury or falls, mild swelling  Past Medical History  Diagnosis Date  . Dysphagia   . Allergic rhinitis   . Unspecified essential hypertension   . Mild depression   . Headache(784.0)   . Urge incontinence   . Chronic rhinitis   . GERD (gastroesophageal reflux disease)   . Cough   . Bronchitis   . ILD (interstitial lung disease)   . Morbid obesity   . Type II or diabetes mellitus, uncontrolled 11/2012 dx    Dx 12/08/12: a1c 10.0  . Dyslipidemia     Review of Systems  Constitutional: Positive for fatigue. Negative for fever and unexpected weight change.  Respiratory: Positive for shortness of breath (chronic). Negative for cough and wheezing.   Cardiovascular: Negative for chest pain and leg swelling.  Gastrointestinal: Negative for nausea, vomiting, abdominal pain and diarrhea.  Neurological: Negative for seizures, numbness and headaches.       Objective:   Physical Exam  BP 122/84  Pulse 95  Temp(Src) 98.2 F (36.8 C) (Oral)  Wt 304 lb (137.893 kg)  BMI 60.4 kg/m2  SpO2 97% Wt Readings from Last 3 Encounters:  04/29/13 304 lb (137.893 kg)  03/17/13 306 lb (138.801 kg)  01/28/13 304 lb 9.6 oz (138.166 kg)   Constitutional: She is obese, but appears well-developed and well-nourished. No distress. spouse at side Neck: Thick. Normal range of motion. Neck supple. No JVD present. No thyromegaly present.  Cardiovascular: Normal rate, regular rhythm and normal heart sounds.  No murmur heard. No BLE edema. Pulmonary/Chest: Effort normal and breath sounds normal. No respiratory distress. She has no wheezes.  Abdominal: Soft.  Bowel sounds are normal. She exhibits no distension. There is no tenderness. no masses MSkel: L ankle - no erythema; full range of motion; stable to ligamentous exam; nontender to palpation medially but mild tenderness laterally over bone and soft tissue; min soft tissue swelling, but no joint effusion; no gross deformity -neurovascularly intact Psychiatric: She has a mildly anxious mood and affect. Her behavior is normal. Judgment and thought content normal.   Lab Results  Component Value Date   WBC 10.1 12/08/2012   HGB 13.1 12/08/2012   HCT 40.4 12/08/2012   PLT 217.0 12/08/2012   GLUCOSE 229* 12/08/2012   CHOL 209* 12/08/2012   TRIG 97.0 12/08/2012   HDL 48.50 12/08/2012   LDLDIRECT 153.1 12/08/2012   LDLCALC 135* 10/31/2010   ALT 62* 12/08/2012   AST 34 12/08/2012   NA 134* 12/08/2012   K 4.4 12/08/2012   CL 99 12/08/2012   CREATININE 0.6 12/08/2012   BUN 13 12/08/2012   CO2 26 12/08/2012   TSH 1.54 12/08/2012   HGBA1C 9.1* 01/28/2013       Assessment & Plan:   See problem list. Medications and labs reviewed today.  Lateral left ankle pain. Check plain film to exclude avulsion fracture or stress injury. Ligamentous function intact but chronic daily pain over past 2 months, increasing in symptoms. 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 Advised on importance of  weight loss and management of pain symptoms

## 2013-04-29 NOTE — Assessment & Plan Note (Signed)
New dx 11/2012 - a1c 10.0 Started Janumet for same - improved, but taking only once each a.m. Change 2 separate components of Januvia with extended release metformin to allow for increased dose with once daily dosing - erx done continue ARB and statin follow up a1c q 3 months, sooner if problems The patient is asked to make an attempt to improve diet and exercise patterns to aid in medical management of this problem.  Lab Results  Component Value Date   HGBA1C 9.1* 01/28/2013

## 2013-04-29 NOTE — Telephone Encounter (Signed)
Ok erx done

## 2013-04-29 NOTE — Patient Instructions (Signed)
It was good to see you today. Test(s) -labs and xray ankle ordered today. Your results will be released to MyChart (or called to you) after review, usually within 72hours after test completion. If any changes need to be made, you will be notified at that same time. Medications reviewed and updated, change Janumet to separate parts: Januvia 100 mg in a.m. And extended release metformin once a day- no other changes recommended at this time. Your prescription(s) have been submitted to your pharmacy. Please take as directed and contact our office if you believe you are having problem(s) with the medication(s). we'll make referral to orthopedics. Our office will contact you regarding appointment(s) once made. Please schedule followup in 3 months for diabetes mellitus and weight check, call sooner if problems.

## 2013-04-29 NOTE — Telephone Encounter (Signed)
Received fax stating rx was sent in for Metformin XR 1000 mg & Janumet 100 mg. May we use Janumet 100/1000 mg? That will save pt some money. Pls advise...Raechel Chute

## 2013-05-29 ENCOUNTER — Telehealth: Payer: Self-pay | Admitting: Internal Medicine

## 2013-05-29 NOTE — Telephone Encounter (Signed)
left messages to schedule follow up apt. no return call back. sent letter 05/29/13 °

## 2013-06-15 ENCOUNTER — Encounter: Payer: Self-pay | Admitting: Adult Health

## 2013-06-15 ENCOUNTER — Ambulatory Visit (INDEPENDENT_AMBULATORY_CARE_PROVIDER_SITE_OTHER): Payer: BC Managed Care – PPO | Admitting: Adult Health

## 2013-06-15 VITALS — BP 104/66 | HR 77 | Temp 97.6°F | Ht 59.5 in | Wt 303.6 lb

## 2013-06-15 DIAGNOSIS — Z23 Encounter for immunization: Secondary | ICD-10-CM

## 2013-06-15 DIAGNOSIS — J8410 Pulmonary fibrosis, unspecified: Secondary | ICD-10-CM

## 2013-06-15 MED ORDER — TRAMADOL HCL 50 MG PO TABS: ORAL_TABLET | ORAL | Status: DC

## 2013-06-15 MED ORDER — FLUTICASONE PROPIONATE 50 MCG/ACT NA SUSP: 2.0000 | Freq: Every day | NASAL | Status: DC | PRN

## 2013-06-15 NOTE — Progress Notes (Signed)
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  Since onset waxing/waning sx requiring intermittent prednisone tapers with only minimal improvement - most of her symptoms chronically have been related to obesity with dyspnea on exertion and tendency to chronic cough which is felt to be at least partly  reflux related.     November 07, 2010 ov cc cough/ strangling  a bit more with taper off reglan (actually worse before ran out of the low dose reglan)  no excess mucus or increase sob on prednisone @  10 mg daily .  Using med calendar well rec 1)  Ok to resume water aerobics but pace yourself 2)  Wait until your swallowing evaulation is complete before making a decision re reglan > stopped it on her own.  3)  Try Prednisone 10 mg one-half each am   04/04/2012 f/u ov/Wert no longer using med calendar on Pred 10 mg one daily  added hyzaar to rx for hbp/leg swelling not back to mall walking yet,  No unusual cough, purulent sputum or sinus/hb symptoms on present rx. No variability to symptoms or need for inhaler. Rec Try prednisone 10 mg one half daily to see if affects your ability to walk the mall on 02 or the cough gets worse (walk the mall first for at least before you try)  Always walk for exercise on 3lpm - this will help you burn fat and get into negative calorie balance to lose weight.  03/17/2013 f/u ov/Wert re NSIP on floor of 10 mg per day Chief Complaint  Patient presents with  . Follow-up    Breathing is overall doing well and she denies any new co's today.   ex on a treadmill x 16m x 3 x daily on 2lpm not the 3lpm as rec >>Prednisone ceiling is 20 mg per day and floor is 10 mg per day   06/15/2013 Follow up and Med review  Returns for follow up and  Med review .  Appears to be taking her med correctly .  We reviewed all her meds  and organized them into a med calendar with pt education.  Does report some increased dry coughing over the weekend, hoarseness, scratchy throat, PND.   No fever or discolored mucus .   Current Medications, Allergies, Past Medical History, Past Surgical History, Family History, and Social History were reviewed in [employer] record.  ROS  The following are not active complaints unless bolded sore throat, dysphagia, dental problems, itching, sneezing,  nasal congestion or excess/ purulent secretions, ear ache,   fever, chills, sweats, unintended wt loss, pleuritic or exertional cp, hemoptysis,  orthopnea pnd or leg swelling, presyncope, palpitations, heartburn, abdominal pain, anorexia, nausea, vomiting, diarrhea  or change in bowel or urinary habits, change in stools or urine, dysuria,hematuria,  rash, arthralgias, visual complaints, headache, numbness weakness or ataxia or problems with walking or coordination,  change in mood/affect or memory.                 Past Medical History: GASTROESOPHAGEAL REFLUX DISEASE     - Taper reglan to 10 mg one half twice daily June 22, 2010 > d/c'd 11/2010  -restarted reglan 03/2011 >>unable to tolerate.  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)   -  Restart Prednisone 02/24/08   -PFT's December 23, 2008 VC 35%  DLC0 31%   -Desats with > 2 laps 06/22/10 so rec wear 02 2lpm at bedtime and with ex  MORBID OBESITY   - Target wt  =  153  for BMI < 30  Hypertension Health Maintenance..........................................................Marland KitchenMarland KitchenFelicity Coyer    - Pneumovax July 22, 2009      - Flu 06/21/2011  Complex med regimen 06/21/2011 , 06/15/2013              Objective:   Physical Exam Massively obese pleasant amb bf nad  wt  305 May 28, 2008 >   302 November 07, 2010 >   303 03/22/2011 > 10/25/2011  299 > 311 04/04/2012 > 306 03/17/2013  >06/15/2013 303  HEENT: nl dentition, turbinates, and orophanx. Nl external ear canals without cough reflex Neck without JVD/Nodes/TM Lungs distant bs,  Distant BS w/ no wheezing / no cough on insp RRR no s3 or murmur or increase in P2. no edema Abd soft and benign with nl excursion in the supine position. Ext warm without calf tenderness, cyanosis clubbing        Assessment & Plan:

## 2013-06-15 NOTE — Addendum Note (Signed)
Addended by: Boone Master E on: 06/15/2013 05:15 PM   Modules accepted: Orders

## 2013-06-15 NOTE — Assessment & Plan Note (Signed)
Mild flare with AR/GERD flare   Plan  Follow med calendar closely and bring to each visit.  May increase prednisone to 20 mg with flare of cough -When better. Return to 10 mg daily and hold at this dose. May use Claritin daily as needed. For postnasal drainage, and nasal drip. May use Mucinex DM as needed. For cough. May, and tramadol 1-2 every 4 hours as needed. For cough control Follow up Dr. Sherene Sires  In 3 months and As needed   Flu shot today

## 2013-06-15 NOTE — Patient Instructions (Addendum)
Follow med calendar closely and bring to each visit.  May increase prednisone to 20 mg with flare of cough -When better. Return to 10 mg daily and hold at this dose. May use Claritin daily as needed. For postnasal drainage, and nasal drip. May use Mucinex DM as needed. For cough. May, and tramadol 1-2 every 4 hours as needed. For cough control Follow up Dr. Sherene Sires  In 3 months and As needed   Flu shot today

## 2013-07-02 NOTE — Addendum Note (Signed)
Addended by: Boone Master E on: 07/02/2013 01:42 PM   Modules accepted: Orders

## 2013-07-04 ENCOUNTER — Other Ambulatory Visit: Payer: Self-pay | Admitting: Internal Medicine

## 2013-07-21 ENCOUNTER — Other Ambulatory Visit: Payer: Self-pay | Admitting: Internal Medicine

## 2013-08-03 ENCOUNTER — Ambulatory Visit: Payer: BC Managed Care – PPO | Admitting: Internal Medicine

## 2013-08-03 DIAGNOSIS — Z0289 Encounter for other administrative examinations: Secondary | ICD-10-CM

## 2013-08-09 ENCOUNTER — Other Ambulatory Visit: Payer: Self-pay | Admitting: Internal Medicine

## 2013-09-03 ENCOUNTER — Other Ambulatory Visit: Payer: Self-pay

## 2013-09-03 DIAGNOSIS — Z1231 Encounter for screening mammogram for malignant neoplasm of breast: Secondary | ICD-10-CM

## 2013-10-03 ENCOUNTER — Other Ambulatory Visit: Payer: Self-pay | Admitting: Internal Medicine

## 2013-10-05 ENCOUNTER — Ambulatory Visit
Admission: RE | Admit: 2013-10-05 | Discharge: 2013-10-05 | Disposition: A | Discharge status: Home / Self Care | Payer: BC Managed Care – PPO | Source: Ambulatory Visit

## 2013-10-05 DIAGNOSIS — Z1231 Encounter for screening mammogram for malignant neoplasm of breast: Secondary | ICD-10-CM

## 2013-10-06 ENCOUNTER — Ambulatory Visit (INDEPENDENT_AMBULATORY_CARE_PROVIDER_SITE_OTHER)
Admission: RE | Admit: 2013-10-06 | Discharge: 2013-10-06 | Disposition: A | Discharge status: Home / Self Care | Payer: BC Managed Care – PPO | Source: Ambulatory Visit | Attending: Internal Medicine | Admitting: Internal Medicine

## 2013-10-06 ENCOUNTER — Encounter: Payer: Self-pay | Admitting: Internal Medicine

## 2013-10-06 ENCOUNTER — Ambulatory Visit (INDEPENDENT_AMBULATORY_CARE_PROVIDER_SITE_OTHER): Payer: BC Managed Care – PPO | Admitting: Internal Medicine

## 2013-10-06 VITALS — BP 114/82 | HR 104 | Temp 97.9°F | Ht 59.5 in | Wt 298.0 lb

## 2013-10-06 DIAGNOSIS — J8410 Pulmonary fibrosis, unspecified: Secondary | ICD-10-CM

## 2013-10-06 DIAGNOSIS — J9610 Chronic respiratory failure, unspecified whether with hypoxia or hypercapnia: Secondary | ICD-10-CM

## 2013-10-06 NOTE — Patient Instructions (Addendum)
Please remember to go to the  x-ray department downstairs for your tests - we will call you with the results when they are available.  When doing great try to reduce prednisone to 10 mg alternating with 5 mg daily as your new floor but increase back to ceiling of 20 mg per day if needed  See calendar for specific medication instructions and bring it back for each and every office visit for every healthcare provider you see.  Without it,  you may not receive the best quality medical care that we feel you deserve.  You will note that the calendar groups together  your maintenance  medications that are timed at particular times of the day.  Think of this as your checklist for what your doctor has instructed you to do until your next evaluation to see what benefit  there is  to staying on a consistent group of medications intended to keep you well.  The other group at the bottom is entirely up to you to use as you see fit  for specific symptoms that may arise between visits that require you to treat them on an as needed basis.  Think of this as your action plan or "what if" list.   Separating the top medications from the bottom group is fundamental to providing you adequate care going forward.       Please schedule a follow up visit in 3 months but call sooner if needed with pfts

## 2013-10-06 NOTE — Assessment & Plan Note (Signed)
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  For now therefore I recommend the patient maintain  A ceiling of 20 mg and a floor of 10 mg a/w 5 mg daily if tolerates

## 2013-10-06 NOTE — Assessment & Plan Note (Signed)
-   Started on 02 06/22/10   - 04/05/2012  Walked 2lpm x 3 laps @ 185 ft each stopped due to  desat corrected on 3lpm so rec 3lpm with exertion  - 10/06/2013  Walked RA x 2 laps @ 185 ft each stopped due to  Cough and desat to 86% at more rapid pace than usual and on 3lpm could do 3 laps s difficulty   rx = no 02 at rest, 3lpm with exertion, 2lpm at hs

## 2013-10-06 NOTE — Progress Notes (Signed)
Quick Note:  Spoke with pt and notified of results per Dr. Wert. Pt verbalized understanding and denied any questions.  ______ 

## 2013-10-06 NOTE — Progress Notes (Signed)
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  Since onset waxing/waning sx requiring intermittent prednisone tapers with only minimal improvement - most of her symptoms chronically have been related to obesity with dyspnea on exertion and tendency to chronic cough which is felt to be at least partly  reflux related.      History of Present Illness  November 07, 2010 ov cc cough/ strangling  a bit more with taper off reglan (actually worse before ran out of the low dose reglan)  no excess mucus or increase sob on prednisone @  10 mg daily .  Using med calendar well rec 1)  Ok to resume water aerobics but pace yourself 2)  Wait until your swallowing evaulation is complete before making a decision re reglan > stopped it on her own.  3)  Try Prednisone 10 mg one-half each am   04/04/2012 f/u ov/Eulis Salazar no longer using med calendar on Pred 10 mg one daily  added hyzaar to rx for hbp/leg swelling not back to mall walking yet,  No unusual cough, purulent sputum or sinus/hb symptoms on present rx. No variability to symptoms or need for inhaler. Rec Try prednisone 10 mg one half daily to see if affects your ability to walk the mall on 02 or the cough gets worse (walk the mall first for at least before you try) Always walk for exercise on 3lpm - this will help you burn fat and get into negative calorie balance to lose weight.   03/17/2013 f/u ov/Marcele Kosta re NSIP on floor of 10 mg per day Chief Complaint  Patient presents with  . Follow-up    Breathing is overall doing well and she denies any new co's today.   ex on a treadmill x 51m x 3 x daily on 2lpm not the 3lpm as rec >>Prednisone ceiling is 20 mg per day and floor is 10 mg per day   06/15/2013 Follow up and Med review  Returns for follow up and  Med review .  Appears to be taking her med  correctly .  We reviewed all her meds and organized them into a med calendar with pt education.  Does report some increased dry coughing over the weekend, hoarseness, scratchy throat, PND.   No fever or discolored mucus .  rec Follow med calendar closely and bring to each visit.  May increase prednisone to 20 mg with flare of cough -When better. Return to 10 mg daily and hold at this dose. May use Claritin daily as needed. For postnasal drainage, and nasal drip. May use Mucinex DM as needed. For cough. May, and tramadol 1-2 every 4 hours as needed. For cough control      10/06/2013 f/u ov/Davie Claud re: NSIP/ pred dep holding at 10 mg per day floor, no need for increase since last ov Chief Complaint  Patient presents with  . Follow-up    Using 02 up to 3lpm with ex   No obvious day to day or daytime variabilty or assoc chronic cough or cp or chest tightness, subjective wheeze overt sinus or hb symptoms. No unusual exp hx or h/o childhood pna/ asthma or knowledge of premature birth.  Sleeping ok without nocturnal  or early am exacerbation  of respiratory  c/o's or need for  noct saba. Also denies any obvious fluctuation of symptoms with weather or environmental changes or other aggravating or alleviating factors except as outlined above   Current Medications, Allergies, Complete Past Medical History, Past Surgical History, Family History, and Social History were reviewed in [employer] record.  ROS  The following are not active complaints unless bolded sore throat, dysphagia, dental problems, itching, sneezing,  nasal congestion or excess/ purulent secretions, ear ache,   fever, chills, sweats, unintended wt loss, pleuritic or exertional cp, hemoptysis,  orthopnea pnd or leg swelling, presyncope, palpitations, heartburn, abdominal pain, anorexia, nausea, vomiting, diarrhea  or change in bowel or urinary habits, change in stools or urine, dysuria,hematuria,  rash, arthralgias,  visual complaints, headache, numbness weakness or ataxia or problems with walking or coordination,  change in mood/affect or memory.             Past Medical History: GASTROESOPHAGEAL REFLUX DISEASE     - Taper reglan to 10 mg one half twice daily June 22, 2010 > d/c'd 11/2010  -restarted reglan 03/2011 >>unable to tolerate.  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)   -Restart Prednisone 02/24/08   -PFT's December 23, 2008 VC 35%  DLC0 31%   -Desats with > 2 laps 06/22/10 so rec wear 02 2lpm at bedtime and with ex  MORBID OBESITY   - Target wt  =  153  for BMI < 30  Hypertension Health Maintenance..........................................................Marland KitchenMarland KitchenAsa Lente    - Pneumovax July 22, 2009      - Flu 06/21/2011  Complex med regimen 06/21/2011 , 06/15/2013              Objective:   Physical Exam  Massively obese pleasant amb bf nad  wt  305 May 28, 2008 >   302 November 07, 2010 >   303 03/22/2011 > 10/25/2011  299 > 311 04/04/2012 > 306 03/17/2013 >06/15/2013 303 > 10/06/2013 298  HEENT: nl dentition, turbinates, and orophanx. Nl external ear canals without cough reflex Neck without JVD/Nodes/TM Lungs distant bs,  Distant BS w/ no wheezing / no cough on insp RRR no s3 or murmur or increase in P2. no edema Abd soft and benign with nl excursion in the supine position. Ext warm without calf tenderness, cyanosis clubbing     CXR  10/06/2013 :  Unchanged chronic bibasilar opacities likely representing scarring.    Assessment & Plan:

## 2013-10-19 ENCOUNTER — Telehealth: Payer: Self-pay | Admitting: *Deleted

## 2013-10-19 NOTE — Telephone Encounter (Signed)
[facility] Triage Call Report Triage Record Num: [id number] Operator: Doug Sou Patient Name: [name] Call Date & Time: 10/17/2013 9:09:09PM Patient Phone: [phone] PCP: Gwendolyn Grant Patient Gender: Female PCP Fax : [phone] Patient DOB: 21-Aug-1971 Practice Name[facility] Reason for Call: LMP was 18 years ago, had a hysterectomy. Caller: Aliha/Patient; PCP: Gwendolyn Grant (Adults only); CB#: [phone]; Call regarding yeast infection. States she started having a little vaginal itching on 10/13/13. States she started having a yellow thick vaginal discharge. States she had been on Ciprofloxacin for urinary symptoms. Finished the Cipro on 10/14/13. Triaged per Vaginal Discharge or Irritation guideline. To see provider within 24 hours due to genital itching, burning or redness. Care advice given. Per standing orders order for Diflucan 150 mg x 1 po called in to [place] at [phone]. Caller made aware and agreed. Protocol(s) Used: Vaginal Discharge or Irritation Recommended Outcome per Protocol: See Provider within 24 hours Reason for Outcome: Genital itching, burning or redness Care Advice: ~ Keep perineum clean and dry. ~ Call provider if symptoms worsen or new symptoms develop. ~ SYMPTOM / CONDITION MANAGEMENT ~ CAUTIONS ~ To help relieve itching/irritation, try a cool compress to vulva using a washcloth for 10-15 minutes. Refrain from douching, using scented deodorant tampons, or nonprescription medication until evaluated by provider. Do not use feminine hygiene sprays. Use condoms during sex. ~ 01/

## 2013-11-13 ENCOUNTER — Ambulatory Visit (INDEPENDENT_AMBULATORY_CARE_PROVIDER_SITE_OTHER): Payer: BC Managed Care – PPO | Admitting: Physician Assistant

## 2013-11-13 ENCOUNTER — Encounter: Payer: Self-pay | Admitting: Physician Assistant

## 2013-11-13 VITALS — BP 120/90 | HR 104 | Temp 97.6°F | Wt 295.4 lb

## 2013-11-13 DIAGNOSIS — J209 Acute bronchitis, unspecified: Secondary | ICD-10-CM

## 2013-11-13 DIAGNOSIS — J329 Chronic sinusitis, unspecified: Secondary | ICD-10-CM

## 2013-11-13 MED ORDER — DM-GUAIFENESIN ER 30-600 MG PO TB12: 1.0000 | ORAL_TABLET | Freq: Two times a day (BID) | ORAL | Status: DC

## 2013-11-13 MED ORDER — AZITHROMYCIN 500 MG PO TABS: 500.0000 mg | ORAL_TABLET | Freq: Every day | ORAL | Status: DC

## 2013-11-13 MED ORDER — ALBUTEROL SULFATE HFA 108 (90 BASE) MCG/ACT IN AERS: 2.0000 | INHALATION_SPRAY | RESPIRATORY_TRACT | Status: DC | PRN

## 2013-11-13 NOTE — Progress Notes (Signed)
    Patient ID: [name] is a 43 y.o. female DOB: 1971-08-02 MRN: [id number]  Subjective:    HPI: patient is a 43 year old female who presents to the clinic with complaint of URI type symptoms for the past five days. Reports runny nose and sinus type headache in past couple days, symptoms seem to be getting better however, now with cough that was dry transitioning to a productive cough of mucus. Cough gets worse at night. Gets SOB and chest tightness when coughing. Cough also gets worse with weather changes. History of interstitial lung disease, follows with pulmonology, is on prednisone 10 mg daily with the understanding from pulmonology to up dose to 20 mg daily when experiencing this type of symptoms. States is out of albuterol inhaler and would like a refill. Denies N/V/F or chills, denies myalgia, change in bowel habits or other concerns at this time. Has used regular Mucinex with little relief. Would like refill of Mucinex DM tab.   Past Medical History  Diagnosis Date  . Dysphagia   . Allergic rhinitis   . Unspecified essential hypertension   . Mild depression   . Headache(784.0)   . Urge incontinence   . Chronic rhinitis   . GERD (gastroesophageal reflux disease)   . Cough   . Bronchitis   . ILD (interstitial lung disease)   . Morbid obesity   . Type II or diabetes mellitus, uncontrolled 11/2012 dx    Dx 12/08/12: a1c 10.0  . Dyslipidemia     Review of Systems Constitutional: no fever or chills HEENT: no ear pain, no pain or difficulty swallowing.  Pulmonary: no hemoptysis Cardiovascular: No chest pain or palpitations Neuro: no lightheaded or dizziness.    Objective:   Physical Exam  BP 120/90  Pulse 104  Temp(Src) 97.6 F (36.4 C) (Oral)  Wt 295 lb 6.4 oz (133.993 kg)  SpO2 97% GEN: mildly ill appearing and audible head/chest congestion HENT: NCAT, mild sinus tenderness bilaterally, nares with thick discharge and turbinate swelling, oropharynx mild erythema  and PND, no exudate Eyes: Vision grossly intact, no conjunctivitis Lungs: Clear to auscultation without rhonchi or wheeze, no increased work of breathing Cardiovascular: Regular rate and rhythm, no bilateral edema  Lab Results  Component Value Date   WBC 10.1 12/08/2012   HGB 13.1 12/08/2012   HCT 40.4 12/08/2012   PLT 217.0 12/08/2012   GLUCOSE 229* 12/08/2012   CHOL 209* 12/08/2012   TRIG 97.0 12/08/2012   HDL 48.50 12/08/2012   LDLDIRECT 153.1 12/08/2012   LDLCALC 135* 10/31/2010   ALT 62* 12/08/2012   AST 34 12/08/2012   NA 134* 12/08/2012   K 4.4 12/08/2012   CL 99 12/08/2012   CREATININE 0.6 12/08/2012   BUN 13 12/08/2012   CO2 26 12/08/2012   TSH 1.54 12/08/2012   HGBA1C 9.2* 04/29/2013      Assessment & Plan:    Sinusitis with Bronchitis Rx for zithromax 500 mg, one tab daily x three days based on progression of symptoms and history of lung disease. Rx for Mucinex DM Rx for albuterol inhaler If no improvement of symptoms RTO for further evaluation.

## 2013-11-13 NOTE — Patient Instructions (Signed)
It was great to meet you today Ms. Villalpando   Prescriptions have been sent to your preferred pharmacy.  Sinusitis Sinusitis is redness, soreness, and puffiness (inflammation) of the air pockets in the bones of your face (sinuses). The redness, soreness, and puffiness can cause air and mucus to get trapped in your sinuses. This can allow germs to grow and cause an infection.  HOME CARE   Drink enough fluids to keep your pee (urine) clear or pale yellow.  Use a humidifier in your home.  Run a hot shower to create steam in the bathroom. Sit in the bathroom with the door closed. Breathe in the steam 3 4 times a day.  Put a warm, moist washcloth on your face 3 4 times a day, or as told by your doctor.  Use salt water sprays (saline sprays) to wet the thick fluid in your nose. This can help the sinuses drain.  Only take medicine as told by your doctor. GET HELP RIGHT AWAY IF:   Your pain gets worse.  You have very bad headaches.  You are sick to your stomach (nauseous).  You throw up (vomit).  You are very sleepy (drowsy) all the time.  Your face is puffy (swollen).  Your vision changes.  You have a stiff neck.  You have trouble breathing. MAKE SURE YOU:   Understand these instructions.  Will watch your condition.  Will get help right away if you are not doing well or get worse. Document Released: 03/05/2008 Document Revised: 06/11/2012 Document Reviewed: 04/22/2012 [facility] Patient Information 2014 [place].      Bronchitis Bronchitis is swelling (inflammation) of the air tubes leading to your lungs (bronchi). This causes mucus and a cough. If the swelling gets bad, you may have trouble breathing. HOME CARE   Rest.  Drink enough fluids to keep your pee (urine) clear or pale yellow (unless you have a condition where you have to watch how much you drink).  Only take medicine as told by your doctor. If you were given antibiotic medicines, finish them even if  you start to feel better.  Avoid smoke, irritating chemicals, and strong smells. These make the problem worse. Quit smoking if you smoke. This helps your lungs heal faster.  Use a cool mist humidifier. Change the water in the humidifier every day. You can also sit in the bathroom with hot shower running for 5 10 minutes. Keep the door closed.  See your health care provider as told.  Wash your hands often. GET HELP IF: Your problems do not get better after 1 week. GET HELP RIGHT AWAY IF:   Your fever gets worse.  You have chills.  Your chest hurts.  Your problems breathing get worse.  You have blood in your mucus.  You pass out (faint).  You feel lightheaded.  You have a bad headache.  You throw up (vomit) again and again. MAKE SURE YOU:  Understand these instructions.  Will watch your condition.  Will get help right away if you are not doing well or get worse. Document Released: 03/05/2008 Document Revised: 07/08/2013 Document Reviewed: 05/12/2013 [facility] Patient Information 2014 [place], [place].

## 2013-11-13 NOTE — Progress Notes (Signed)
Pre visit review using our clinic review tool, if applicable. No additional management support is needed unless otherwise documented below in the visit note. 

## 2013-11-26 ENCOUNTER — Other Ambulatory Visit: Payer: Self-pay | Admitting: Internal Medicine

## 2014-01-01 ENCOUNTER — Other Ambulatory Visit: Payer: Self-pay | Admitting: Internal Medicine

## 2014-02-01 ENCOUNTER — Other Ambulatory Visit: Payer: Self-pay | Admitting: Internal Medicine

## 2014-02-05 ENCOUNTER — Other Ambulatory Visit: Payer: Self-pay | Admitting: Internal Medicine

## 2014-02-05 DIAGNOSIS — J8410 Pulmonary fibrosis, unspecified: Secondary | ICD-10-CM

## 2014-02-08 ENCOUNTER — Encounter: Payer: Self-pay | Admitting: Internal Medicine

## 2014-02-08 ENCOUNTER — Ambulatory Visit (INDEPENDENT_AMBULATORY_CARE_PROVIDER_SITE_OTHER): Payer: BC Managed Care – PPO | Admitting: Internal Medicine

## 2014-02-08 VITALS — BP 104/78 | HR 98 | Temp 98.4°F | Ht 60.0 in | Wt 289.0 lb

## 2014-02-08 DIAGNOSIS — J8410 Pulmonary fibrosis, unspecified: Secondary | ICD-10-CM

## 2014-02-08 DIAGNOSIS — J9610 Chronic respiratory failure, unspecified whether with hypoxia or hypercapnia: Secondary | ICD-10-CM

## 2014-02-08 LAB — PULMONARY FUNCTION TEST
DL/VA % pred: 153 %
DL/VA: 6.49 ml/min/mmHg/L
DLCO UNC % PRED: 70 %
DLCO unc: 13.31 ml/min/mmHg
FEF 25-75 Post: 1.49 L/sec
FEF 25-75 Pre: 0.94 L/sec
FEF2575-%Change-Post: 58 %
FEF2575-%PRED-POST: 59 %
FEF2575-%Pred-Pre: 37 %
FEV1-%Change-Post: 4 %
FEV1-%PRED-POST: 48 %
FEV1-%PRED-PRE: 46 %
FEV1-POST: 1.06 L
FEV1-PRE: 1.01 L
FEV1FVC-%CHANGE-POST: 6 %
FEV1FVC-%PRED-PRE: 101 %
FEV6-%Change-Post: -3 %
FEV6-%Pred-Post: 45 %
FEV6-%Pred-Pre: 46 %
FEV6-POST: 1.15 L
FEV6-Pre: 1.19 L
FEV6FVC-%CHANGE-POST: 0 %
FEV6FVC-%Pred-Post: 103 %
FEV6FVC-%Pred-Pre: 103 %
FVC-%CHANGE-POST: -1 %
FVC-%Pred-Post: 44 %
FVC-%Pred-Pre: 45 %
FVC-Post: 1.17 L
FVC-Pre: 1.19 L
PRE FEV1/FVC RATIO: 85 %
Post FEV1/FVC ratio: 90 %
Post FEV6/FVC ratio: 100 %
Pre FEV6/FVC Ratio: 100 %
RV % PRED: 57 %
RV: 0.81 L
TLC % pred: 47 %
TLC: 2.11 L

## 2014-02-08 NOTE — Assessment & Plan Note (Addendum)
-  VATS 02/15/2004 NSIP (Katzenstein reviewed/confirmed)   -Restarted Prednisone 02/24/08   -PFT's December 23, 2008 VC 35%  DLC0 31%   -PFT's 02/08/2014   VC 1.30 (49%) and DLCO is 70%  The goal with a chronic steroid dependent illness is always arriving at the lowest effective dose that controls the disease/symptoms and not accepting a set "formula" which is based on statistics or guidelines that don't always take into account patient  variability or the natural hx of the dz in every individual patient, which may well vary over time.  For now therefore I recommend the patient maintain  A ceiling of 20 mg and a floor of 10/5 x next 6 months    Each maintenance medication was reviewed in detail including most importantly the difference between maintenance and as needed and under what circumstances the prns are to be used.

## 2014-02-08 NOTE — Assessment & Plan Note (Signed)
-   Started on 02 06/22/10   - 04/05/2012  Walked 2lpm x 3 laps @ 185 ft each stopped due to  desat corrected on 3lpm so rec 3lpm with exertion  - 10/06/2013  Walked RA x 2 laps @ 185 ft each stopped due to  Cough and desat to 86% at more rapid pace than usual and on 3lpm could do 3 laps s difficulty   rx = no 02 at rest, 3lpm with exertion, 2lpm at hs   02 dependency should improve with wt loss, reviewed  See instructions for specific recommendations which were reviewed directly with the patient who was given a copy with highlighter outlining the key components.

## 2014-02-08 NOTE — Progress Notes (Signed)
PFT done today. 

## 2014-02-08 NOTE — Patient Instructions (Addendum)
After 2 weeks of regular/routine treadmill work then  If  doing great try to reduce prednisone to 10 mg alternating with 5 mg daily as your new floor but increase back to ceiling of 20 mg per day if needed  Please schedule a follow up visit in 6 months but call sooner if needed

## 2014-02-08 NOTE — Progress Notes (Signed)
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  Since onset waxing/waning sx requiring intermittent prednisone tapers with only minimal improvement - most of her symptoms chronically have been related to obesity with dyspnea on exertion and tendency to chronic cough which is felt to be at least partly  reflux related.      History of Present Illness  November 07, 2010 ov cc cough/ strangling  a bit more with taper off reglan (actually worse before ran out of the low dose reglan)  no excess mucus or increase sob on prednisone @  10 mg daily .  Using med calendar well rec 1)  Ok to resume water aerobics but pace yourself 2)  Wait until your swallowing evaulation is complete before making a decision re reglan > stopped it on her own.  3)  Try Prednisone 10 mg one-half each am   04/04/2012 f/u ov/Fatemah Pourciau no longer using med calendar on Pred 10 mg one daily  added hyzaar to rx for hbp/leg swelling not back to mall walking yet,  No unusual cough, purulent sputum or sinus/hb symptoms on present rx. No variability to symptoms or need for inhaler. Rec Try prednisone 10 mg one half daily to see if affects your ability to walk the mall on 02 or the cough gets worse (walk the mall first for at least before you try) Always walk for exercise on 3lpm - this will help you burn fat and get into negative calorie balance to lose weight.   03/17/2013 f/u ov/Rox Mcgriff re NSIP on floor of 10 mg per day Chief Complaint  Patient presents with  . Follow-up    Breathing is overall doing well and she denies any new co's today.   ex on a treadmill x 40m x 3 x daily on 2lpm not the 3lpm as rec >>Prednisone ceiling is 20 mg per day and floor is 10 mg per day   06/15/2013 Follow up and Med review  Returns for follow up and  Med review .  Appears to be taking her med  correctly .  We reviewed all her meds and organized them into a med calendar with pt education.  Does report some increased dry coughing over the weekend, hoarseness, scratchy throat, PND.   No fever or discolored mucus .  rec Follow med calendar closely and bring to each visit.  May increase prednisone to 20 mg with flare of cough -When better. Return to 10 mg daily and hold at this dose. May use Claritin daily as needed. For postnasal drainage, and nasal drip. May use Mucinex DM as needed. For cough. May, and tramadol 1-2 every 4 hours as needed. For cough control      10/06/2013 f/u ov/Dany Walther re: NSIP/ pred dep holding at 10 mg per day floor, no need for increase since last ov Chief Complaint  Patient presents with  . Follow-up    Using 02 up to 3lpm with ex  rec Please remember to go to the  x-ray department downstairs for your tests - we will call you with the results when they are available. When doing great try to reduce prednisone to 10 mg alternating with 5 mg daily as your new floor but increase back to ceiling of 20 mg per day if needed  02/08/2014 f/u ov/Steffen Hase re: USIP/ pred 10 floor and did not wean as rec Chief Complaint  Patient presents with  . Followup with PFT  3lpm  X 30 min on treadmill at 2.2- 2.4 flat x 2-3 x per week    No obvious day to day or daytime variabilty or assoc chronic cough or cp or chest tightness, subjective wheeze overt sinus or hb symptoms. No unusual exp hx or h/o childhood pna/ asthma or knowledge of premature birth.  Sleeping ok without nocturnal  or early am exacerbation  of respiratory  c/o's or need for noct saba. Also denies any obvious fluctuation of symptoms with weather or environmental changes or other aggravating or alleviating factors except as outlined above   Current Medications, Allergies, Complete Past Medical History, Past Surgical History, Family History, and Social History were reviewed in [employer]  record.  ROS  The following are not active complaints unless bolded sore throat, dysphagia, dental problems, itching, sneezing,  nasal congestion or excess/ purulent secretions, ear ache,   fever, chills, sweats, unintended wt loss, pleuritic or exertional cp, hemoptysis,  orthopnea pnd or leg swelling, presyncope, palpitations, heartburn, abdominal pain, anorexia, nausea, vomiting, diarrhea  or change in bowel or urinary habits, change in stools or urine, dysuria,hematuria,  rash, arthralgias, visual complaints, headache, numbness weakness or ataxia or problems with walking or coordination,  change in mood/affect or memory.             Past Medical History: GASTROESOPHAGEAL REFLUX DISEASE     - Tapered reglan to 10 mg one half twice daily June 22, 2010 > d/c'd 11/2010  -restarted reglan 03/2011 >>unable to tolerate.  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)   -Restart Prednisone 02/24/08   -PFT's December 23, 2008 VC 35%  DLC0 31%   -Desats with > 2 laps 06/22/10 so rec wear 02 2lpm at bedtime and with ex  MORBID OBESITY   - Target wt  =  153  for BMI < 30  Hypertension Health Maintenance..........................................................Marland KitchenMarland KitchenAsa Lente    - Pneumovax July 22, 2009      - Flu 06/21/2011  Complex med regimen 06/21/2011 , 06/15/2013              Objective:   Physical Exam  Massively obese pleasant amb bf nad  wt  305 May 28, 2008 >   302 November 07, 2010 >   303 03/22/2011 > 10/25/2011  299 > 311 04/04/2012 > 306 03/17/2013 >06/15/2013 303 > 10/06/2013 298  > 02/08/2014 289  HEENT: nl dentition, turbinates, and orophanx. Nl external ear canals without cough reflex Neck without JVD/Nodes/TM Lungs distant bs,  Distant BS w/ no wheezing / no cough on insp RRR no s3 or murmur or increase in P2. no edema Abd soft and benign with nl excursion in the supine position. Ext warm  without calf tenderness, cyanosis clubbing     CXR  10/06/2013 :  Unchanged chronic bibasilar opacities likely representing scarring.    Assessment & Plan:

## 2014-02-10 ENCOUNTER — Other Ambulatory Visit: Payer: Self-pay | Admitting: Internal Medicine

## 2014-03-31 ENCOUNTER — Other Ambulatory Visit: Payer: Self-pay | Admitting: Internal Medicine

## 2014-04-07 ENCOUNTER — Telehealth: Payer: Self-pay | Admitting: *Deleted

## 2014-04-07 DIAGNOSIS — IMO0001 Reserved for inherently not codable concepts without codable children: Secondary | ICD-10-CM

## 2014-04-07 DIAGNOSIS — E1165 Type 2 diabetes mellitus with hyperglycemia: Principal | ICD-10-CM

## 2014-04-07 NOTE — Telephone Encounter (Signed)
Patient has an appointment. Lipid, a1c, bmet ordered Diabetic bundle 

## 2014-04-26 ENCOUNTER — Other Ambulatory Visit (INDEPENDENT_AMBULATORY_CARE_PROVIDER_SITE_OTHER): Payer: BC Managed Care – PPO

## 2014-04-26 ENCOUNTER — Ambulatory Visit (INDEPENDENT_AMBULATORY_CARE_PROVIDER_SITE_OTHER): Payer: BC Managed Care – PPO | Admitting: Internal Medicine

## 2014-04-26 ENCOUNTER — Encounter: Payer: Self-pay | Admitting: Internal Medicine

## 2014-04-26 VITALS — BP 148/86 | HR 74 | Temp 98.2°F | Ht 60.0 in | Wt 287.2 lb

## 2014-04-26 DIAGNOSIS — J8410 Pulmonary fibrosis, unspecified: Secondary | ICD-10-CM

## 2014-04-26 DIAGNOSIS — E1165 Type 2 diabetes mellitus with hyperglycemia: Principal | ICD-10-CM

## 2014-04-26 DIAGNOSIS — Z0000 Encounter for general adult medical examination without abnormal findings: Secondary | ICD-10-CM

## 2014-04-26 DIAGNOSIS — IMO0001 Reserved for inherently not codable concepts without codable children: Secondary | ICD-10-CM

## 2014-04-26 DIAGNOSIS — E785 Hyperlipidemia, unspecified: Secondary | ICD-10-CM

## 2014-04-26 DIAGNOSIS — R300 Dysuria: Secondary | ICD-10-CM

## 2014-04-26 LAB — URINALYSIS, ROUTINE W REFLEX MICROSCOPIC
Bilirubin Urine: NEGATIVE
Hgb urine dipstick: NEGATIVE
Ketones, ur: NEGATIVE
LEUKOCYTES UA: NEGATIVE
Nitrite: NEGATIVE
PH: 6 (ref 5.0–8.0)
SPECIFIC GRAVITY, URINE: 1.015 (ref 1.000–1.030)
Total Protein, Urine: NEGATIVE
UROBILINOGEN UA: 0.2 (ref 0.0–1.0)
Urine Glucose: >=1000 — AB

## 2014-04-26 LAB — CBC WITH DIFFERENTIAL/PLATELET
Basophils Absolute: 0 10*3/uL (ref 0.0–0.1)
Basophils Relative: 0.2 % (ref 0.0–3.0)
EOS PCT: 0.3 % (ref 0.0–5.0)
Eosinophils Absolute: 0 10*3/uL (ref 0.0–0.7)
HEMATOCRIT: 41.5 % (ref 36.0–46.0)
Hemoglobin: 13.4 g/dL (ref 12.0–15.0)
Lymphocytes Relative: 23.4 % (ref 12.0–46.0)
Lymphs Abs: 1.9 10*3/uL (ref 0.7–4.0)
MCHC: 32.3 g/dL (ref 30.0–36.0)
MCV: 91 fl (ref 78.0–100.0)
MONOS PCT: 5.7 % (ref 3.0–12.0)
Monocytes Absolute: 0.5 10*3/uL (ref 0.1–1.0)
NEUTROS ABS: 5.8 10*3/uL (ref 1.4–7.7)
Neutrophils Relative %: 70.4 % (ref 43.0–77.0)
Platelets: 195 10*3/uL (ref 150.0–400.0)
RBC: 4.56 Mil/uL (ref 3.87–5.11)
RDW: 14.3 % (ref 11.5–15.5)
WBC: 8.3 10*3/uL (ref 4.0–10.5)

## 2014-04-26 LAB — HEPATIC FUNCTION PANEL
ALBUMIN: 3.6 g/dL (ref 3.5–5.2)
ALT: 55 U/L — AB (ref 0–35)
AST: 31 U/L (ref 0–37)
Alkaline Phosphatase: 83 U/L (ref 39–117)
Bilirubin, Direct: 0.1 mg/dL (ref 0.0–0.3)
Total Bilirubin: 0.7 mg/dL (ref 0.2–1.2)
Total Protein: 7.8 g/dL (ref 6.0–8.3)

## 2014-04-26 LAB — BASIC METABOLIC PANEL
BUN: 11 mg/dL (ref 6–23)
CO2: 29 mEq/L (ref 19–32)
Calcium: 9 mg/dL (ref 8.4–10.5)
Chloride: 99 mEq/L (ref 96–112)
Creatinine, Ser: 0.6 mg/dL (ref 0.4–1.2)
GFR: 130.82 mL/min (ref >60.00)
GLUCOSE: 272 mg/dL — AB (ref 70–99)
POTASSIUM: 3.9 meq/L (ref 3.5–5.1)
SODIUM: 136 meq/L (ref 135–145)

## 2014-04-26 LAB — LIPID PANEL
CHOLESTEROL: 172 mg/dL (ref 0–200)
HDL: 43.1 mg/dL (ref >39.00)
LDL Cholesterol: 117 mg/dL — ABNORMAL HIGH (ref 0–99)
NonHDL: 128.9
Total CHOL/HDL Ratio: 4
Triglycerides: 61 mg/dL (ref 0.0–149.0)
VLDL: 12.2 mg/dL (ref 0.0–40.0)

## 2014-04-26 LAB — MICROALBUMIN / CREATININE URINE RATIO
CREATININE, U: 74.5 mg/dL
Microalb Creat Ratio: 0.7 mg/g (ref 0.0–30.0)
Microalb, Ur: 0.5 mg/dL (ref 0.0–1.9)

## 2014-04-26 LAB — HEMOGLOBIN A1C: Hgb A1c MFr Bld: 11.6 % — ABNORMAL HIGH (ref 4.6–6.5)

## 2014-04-26 LAB — TSH: TSH: 0.87 u[IU]/mL (ref 0.35–4.50)

## 2014-04-26 MED ORDER — SITAGLIP PHOS-METFORMIN HCL ER 100-1000 MG PO TB24: 1.0000 | ORAL_TABLET | Freq: Every day | ORAL | Status: DC

## 2014-04-26 MED ORDER — ATORVASTATIN CALCIUM 20 MG PO TABS: 20.0000 mg | ORAL_TABLET | Freq: Every day | ORAL | Status: DC

## 2014-04-26 MED ORDER — INSULIN DETEMIR 100 UNIT/ML ~~LOC~~ SOLN: 15.0000 [IU] | Freq: Every day | SUBCUTANEOUS | Status: DC

## 2014-04-26 MED ORDER — TOLTERODINE TARTRATE ER 2 MG PO CP24: 2.0000 mg | ORAL_CAPSULE | Freq: Every day | ORAL | Status: DC

## 2014-04-26 MED ORDER — LOSARTAN POTASSIUM 100 MG PO TABS: 100.0000 mg | ORAL_TABLET | Freq: Every day | ORAL | Status: DC

## 2014-04-26 NOTE — Progress Notes (Signed)
Subjective:    Patient ID: [name], female    DOB: Oct 29, 1970, 43 y.o.   MRN: [id number]  HPI  Patient is here for follow up - last OV with me 03/2013 patient is here today for annual physical. Patient feels well in general Also reviewed chronic medical issues and interval medical events: DM2, lipids, PIF  Past Medical History  Diagnosis Date  . Dysphagia   . Allergic rhinitis   . Unspecified essential hypertension   . Mild depression   . Headache(784.0)   . Urge incontinence   . Chronic rhinitis   . GERD (gastroesophageal reflux disease)   . Cough   . Bronchitis   . ILD (interstitial lung disease)   . Morbid obesity   . Type II or diabetes mellitus, uncontrolled 11/2012 dx    Dx 12/08/12: a1c 10.0  . Dyslipidemia    Family History  Problem Relation Age of Onset  . Sarcoidosis Mother     dxed by transbrochial bx  . Allergies Mother   . Heart disease Father   . Diabetes Father   . Allergies Father   . Coronary artery disease Father   . Cancer Maternal Grandmother     CA of unknown type  . Cancer Paternal Grandmother     CA of unknown type   History  Substance Use Topics  . Smoking status: Never Smoker   . 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 Alcohol Use: No    Review of Systems  Constitutional: Positive for fatigue. Negative for fever and unexpected weight change.  Respiratory: Positive for shortness of breath (chronic without change). Negative for cough.   Cardiovascular: Negative for chest pain, palpitations and leg swelling.  Genitourinary: Positive for dysuria, frequency and vaginal discharge (intermittent with urinary sx).  Musculoskeletal: Positive for arthralgias.  All other systems reviewed and are negative.      Objective:   Physical Exam  BP 148/86  Pulse 74  Temp(Src) 98.2 F (36.8 C) (Oral)  Ht 5' (1.524 m)  Wt 287 lb 4 oz (130.296 kg)   BMI 56.10 kg/m2  SpO2 98% Wt Readings from Last 3 Encounters:  04/26/14 287 lb 4 oz (130.296 kg)  02/08/14 289 lb (131.09 kg)  11/13/13 295 lb 6.4 oz (133.993 kg)   Constitutional: She is obese, and appears well-developed and well-nourished. No distress.  HENT: Head: Normocephalic and atraumatic. Ears: B TMs ok, no erythema or effusion; Nose: Nose normal. Mouth/Throat: Oropharynx is clear and moist. No oropharyngeal exudate.  Eyes: Conjunctivae and EOM are normal. Pupils are equal, round, and reactive to light. No scleral icterus.  Neck: Normal range of motion. Neck supple. No JVD present. No thyromegaly present.  Cardiovascular: Normal rate, regular rhythm and normal heart sounds.  No murmur heard. No BLE edema. Pulmonary/Chest: Effort normal and breath sounds normal. No respiratory distress. She has no wheezes.  Abdominal: Soft. Bowel sounds are normal. She exhibits no distension. There is no tenderness. no masses GU/breast: defer to gyn Musculoskeletal: Normal range of motion, no joint effusions. No gross deformities Neurological: She is alert and oriented to person, place, and time. No cranial nerve deficit. Coordination, balance, strength, speech and gait are normal.  Skin: Skin is warm and dry. No rash noted. No erythema.  Psychiatric: She has a normal mood and affect. Her behavior is normal. Judgment and thought content normal.  Lab Results  Component Value Date   WBC 10.1 12/08/2012   HGB 13.1 12/08/2012   HCT 40.4 12/08/2012   PLT 217.0 12/08/2012   GLUCOSE 229* 12/08/2012   CHOL 209* 12/08/2012   TRIG 97.0 12/08/2012   HDL 48.50 12/08/2012   LDLDIRECT 153.1 12/08/2012   LDLCALC 135* 10/31/2010   ALT 62* 12/08/2012   AST 34 12/08/2012   NA 134* 12/08/2012   K 4.4 12/08/2012   CL 99 12/08/2012   CREATININE 0.6 12/08/2012   BUN 13 12/08/2012   CO2 26 12/08/2012   TSH 1.54 12/08/2012   HGBA1C 9.2* 04/29/2013    Dg Chest 2 View  10/06/2013   CLINICAL DATA:  Follow-up pulmonary  fibrosis.  Cough.  EXAM: CHEST  2 VIEW  COMPARISON:  Chest radiograph 06/05/2012.  FINDINGS: Stable cardiac and mediastinal contours. Low lung volumes. Unchanged bilateral lower lung linear opacities. No large consolidative pulmonary opacities. No pleural effusion or pneumothorax.  IMPRESSION: Unchanged chronic bibasilar opacities likely representing scarring.   Electronically Signed   By: Lovey Newcomer M.D.   On: 10/06/2013 11:01       Assessment & Plan:   CPX/v70.0 - Patient has been counseled on age-appropriate routine health concerns for screening and prevention. These are reviewed and up-to-date. Immunizations are up-to-date or declined. Labs ordered and reviewed.  Dysuria - check UA - tx if UTI  Problem List Items Addressed This Visit   Dyslipidemia     On statin - Recheck q6-12 mo, titrate as needed for LDL <100    Relevant Medications      atorvastatin (LIPITOR) tablet   Other Relevant Orders      Lipid panel   INTERSTITIAL LUNG DISEASE     On O2 related to interstitial lung dz and OHS  Variable dose of pred as per pulm - but plateaued dose at 10mg  qd    Relevant Orders      CBC with Differential      TSH   Type II or diabetes mellitus, uncontrolled      New dx 11/2012 - a1c 10.0 Started Janumet for same - improved, but taking only once each a.m. Changed to XR 03/2013- erx done Repots uncontrolled, esp with variable pred dosing related to lung dz continue ARB and statin follow up a1c q 3 months, sooner if problems The patient is asked to make an attempt to improve diet and exercise patterns to aid in medical management of this problem.  Lab Results  Component Value Date   HGBA1C 9.2* 04/29/2013      Relevant Medications      atorvastatin (LIPITOR) tablet      SitaGLIPtin-MetFORMIN HCl (JANUMET XR) [phone] MG TB24      losartan (COZAAR) tablet      LEVEMIR 100 UNIT/ML [place] SOLN   Other Relevant Orders      Basic metabolic panel      Hepatic function panel      Lipid  panel      Hemoglobin A1c      Microalbumin / creatinine urine ratio    Other Visit Diagnoses   Routine general medical examination at a health care facility    -  Primary    Relevant Orders       Hepatic function panel       TSH       Urinalysis, Routine w reflex microscopic    Dysuria        Relevant Orders  Urinalysis, Routine w reflex microscopic

## 2014-04-26 NOTE — Assessment & Plan Note (Signed)
New dx 11/2012 - a1c 10.0 Started Janumet for same - improved, but taking only once each a.m. Changed to XR 03/2013- erx done Repots uncontrolled, esp with variable pred dosing related to lung dz continue ARB and statin follow up a1c q 3 months, sooner if problems The patient is asked to make an attempt to improve diet and exercise patterns to aid in medical management of this problem.  Lab Results  Component Value Date   HGBA1C 9.2* 04/29/2013

## 2014-04-26 NOTE — Patient Instructions (Addendum)
It was good to see you today.  We have reviewed your prior records including labs and tests today  Health Maintenance reviewed - all recommended immunizations and age-appropriate screenings are up-to-date.  Test(s) ordered today. Your results will be released to [place] (or called to you) after review, usually within 72hours after test completion. If any changes need to be made, you will be notified at that same time.  Medications reviewed and updated Start Levemir (insulin) once daily in addition to ongoing pills for diabetes mellitus - no other changes recommended at this time.  Your prescription(s)/refills have been submitted to your pharmacy. Please take as directed and contact our office if you believe you are having problem(s) with the medication(s).  Please schedule followup in 4-6 months for diabetes mellitus check and labs, call sooner if problems.  Health Maintenance Adopting a healthy lifestyle and getting preventive care can go a long way to promote health and wellness. Talk with your health care provider about what schedule of regular examinations is right for you. This is a good chance for you to check in with your provider about disease prevention and staying healthy. In between checkups, there are plenty of things you can do on your own. Experts have done a lot of research about which lifestyle changes and preventive measures are most likely to keep you healthy. Ask your health care provider for more information. WEIGHT AND DIET  Eat a healthy diet  Be sure to include plenty of vegetables, fruits, low-fat dairy products, and lean protein.  Do not eat a lot of foods high in solid fats, added sugars, or salt.  Get regular exercise. This is one of the most important things you can do for your health.  Most adults should exercise for at least 150 minutes each week. The exercise should increase your heart rate and make you sweat (moderate-intensity exercise).  Most adults  should also do strengthening exercises at least twice a week. This is in addition to the moderate-intensity exercise.  Maintain a healthy weight  Body mass index (BMI) is a measurement that can be used to identify possible weight problems. It estimates body fat based on height and weight. Your health care provider can help determine your BMI and help you achieve or maintain a healthy weight.  For females 31 years of age and older:   A BMI below 18.5 is considered underweight.  A BMI of 18.5 to 24.9 is normal.  A BMI of 25 to 29.9 is considered overweight.  A BMI of 30 and above is considered obese.  Watch levels of cholesterol and blood lipids  You should start having your blood tested for lipids and cholesterol at 42 years of age, then have this test every 5 years.  You may need to have your cholesterol levels checked more often if:  Your lipid or cholesterol levels are high.  You are older than 42 years of age.  You are at high risk for heart disease.  CANCER SCREENING   Lung Cancer  Lung cancer screening is recommended for adults 38-23 years old who are at high risk for lung cancer because of a history of smoking.  A yearly low-dose CT scan of the lungs is recommended for people who:  Currently smoke.  Have quit within the past 15 years.  Have at least a 30-pack-year history of smoking. A pack year is smoking an average of one pack of cigarettes a day for 1 year.  Yearly screening should continue until it  has been 15 years since you quit.  Yearly screening should stop if you develop a health problem that would prevent you from having lung cancer treatment.  Breast Cancer  Practice breast self-awareness. This means understanding how your breasts normally appear and feel.  It also means doing regular breast self-exams. Let your health care provider know about any changes, no matter how small.  If you are in your 20s or 30s, you should have a clinical breast exam  (CBE) by a health care provider every 1-3 years as part of a regular health exam.  If you are 5 or older, have a CBE every year. Also consider having a breast X-ray (mammogram) every year.  If you have a family history of breast cancer, talk to your health care provider about genetic screening.  If you are at high risk for breast cancer, talk to your health care provider about having an MRI and a mammogram every year.  Breast cancer gene (BRCA) assessment is recommended for women who have family members with BRCA-related cancers. BRCA-related cancers include:  Breast.  Ovarian.  Tubal.  Peritoneal cancers.  Results of the assessment will determine the need for genetic counseling and BRCA1 and BRCA2 testing. Cervical Cancer Routine pelvic examinations to screen for cervical cancer are no longer recommended for nonpregnant women who are considered low risk for cancer of the pelvic organs (ovaries, uterus, and vagina) and who do not have symptoms. A pelvic examination may be necessary if you have symptoms including those associated with pelvic infections. Ask your health care provider if a screening pelvic exam is right for you.   The Pap test is the screening test for cervical cancer for women who are considered at risk.  If you had a hysterectomy for a problem that was not cancer or a condition that could lead to cancer, then you no longer need Pap tests.  If you are older than 65 years, and you have had normal Pap tests for the past 10 years, you no longer need to have Pap tests.  If you have had past treatment for cervical cancer or a condition that could lead to cancer, you need Pap tests and screening for cancer for at least 20 years after your treatment.  If you no longer get a Pap test, assess your risk factors if they change (such as having a new sexual partner). This can affect whether you should start being screened again.  Some women have medical problems that increase their  chance of getting cervical cancer. If this is the case for you, your health care provider may recommend more frequent screening and Pap tests.  The human papillomavirus (HPV) test is another test that may be used for cervical cancer screening. The HPV test looks for the virus that can cause cell changes in the cervix. The cells collected during the Pap test can be tested for HPV.  The HPV test can be used to screen women 42 years of age and older. Getting tested for HPV can extend the interval between normal Pap tests from three to five years.  An HPV test also should be used to screen women of any age who have unclear Pap test results.  After 42 years of age, women should have HPV testing as often as Pap tests.  Colorectal Cancer  This type of cancer can be detected and often prevented.  Routine colorectal cancer screening usually begins at 42 years of age and continues through 42 years of age.  Your health care provider may recommend screening at an earlier age if you have risk factors for colon cancer.  Your health care provider may also recommend using home test kits to check for hidden blood in the stool.  A small camera at the end of a tube can be used to examine your colon directly (sigmoidoscopy or colonoscopy). This is done to check for the earliest forms of colorectal cancer.  Routine screening usually begins at age 56.  Direct examination of the colon should be repeated every 5-10 years through 42 years of age. However, you may need to be screened more often if early forms of precancerous polyps or small growths are found. Skin Cancer  Check your skin from head to toe regularly.  Tell your health care provider about any new moles or changes in moles, especially if there is a change in a mole's shape or color.  Also tell your health care provider if you have a mole that is larger than the size of a pencil eraser.  Always use sunscreen. Apply sunscreen liberally and  repeatedly throughout the day.  Protect yourself by wearing long sleeves, pants, a wide-brimmed hat, and sunglasses whenever you are outside. HEART DISEASE, DIABETES, AND HIGH BLOOD PRESSURE   Have your blood pressure checked at least every 1-2 years. High blood pressure causes heart disease and increases the risk of stroke.  If you are between 44 years and 60 years old, ask your health care provider if you should take aspirin to prevent strokes.  Have regular diabetes screenings. This involves taking a blood sample to check your fasting blood sugar level.  If you are at a normal weight and have a low risk for diabetes, have this test once every three years after 42 years of age.  If you are overweight and have a high risk for diabetes, consider being tested at a younger age or more often. PREVENTING INFECTION  Hepatitis B  If you have a higher risk for hepatitis B, you should be screened for this virus. You are considered at high risk for hepatitis B if:  You were born in a country where hepatitis B is common. Ask your health care provider which countries are considered high risk.  Your parents were born in a high-risk country, and you have not been immunized against hepatitis B (hepatitis B vaccine).  You have HIV or AIDS.  You use needles to inject street drugs.  You live with someone who has hepatitis B.  You have had sex with someone who has hepatitis B.  You get hemodialysis treatment.  You take certain medicines for conditions, including cancer, organ transplantation, and autoimmune conditions. Hepatitis C  Blood testing is recommended for:  Everyone born from [birth year] through 1964.  Anyone with known risk factors for hepatitis C. Sexually transmitted infections (STIs)  You should be screened for sexually transmitted infections (STIs) including gonorrhea and chlamydia if:  You are sexually active and are younger than 42 years of age.  You are older than 42 years of  age and your health care provider tells you that you are at risk for this type of infection.  Your sexual activity has changed since you were last screened and you are at an increased risk for chlamydia or gonorrhea. Ask your health care provider if you are at risk.  If you do not have HIV, but are at risk, it may be recommended that you take a prescription medicine daily to prevent HIV infection. This is  called pre-exposure prophylaxis (PrEP). You are considered at risk if:  You are sexually active and do not regularly use condoms or know the HIV status of your partner(s).  You take drugs by injection.  You are sexually active with a partner who has HIV. Talk with your health care provider about whether you are at high risk of being infected with HIV. If you choose to begin PrEP, you should first be tested for HIV. You should then be tested every 3 months for as long as you are taking PrEP.  PREGNANCY   If you are premenopausal and you may become pregnant, ask your health care provider about preconception counseling.  If you may become pregnant, take 400 to 800 micrograms (mcg) of folic acid every day.  If you want to prevent pregnancy, talk to your health care provider about birth control (contraception). OSTEOPOROSIS AND MENOPAUSE   Osteoporosis is a disease in which the bones lose minerals and strength with aging. This can result in serious bone fractures. Your risk for osteoporosis can be identified using a bone density scan.  If you are 56 years of age or older, or if you are at risk for osteoporosis and fractures, ask your health care provider if you should be screened.  Ask your health care provider whether you should take a calcium or vitamin D supplement to lower your risk for osteoporosis.  Menopause may have certain physical symptoms and risks.  Hormone replacement therapy may reduce some of these symptoms and risks. Talk to your health care provider about whether hormone  replacement therapy is right for you.  HOME CARE INSTRUCTIONS   Schedule regular health, dental, and eye exams.  Stay current with your immunizations.   Do not use any tobacco products including cigarettes, chewing tobacco, or electronic cigarettes.  If you are pregnant, do not drink alcohol.  If you are breastfeeding, limit how much and how often you drink alcohol.  Limit alcohol intake to no more than 1 drink per day for nonpregnant women. One drink equals 12 ounces of beer, 5 ounces of wine, or 1 ounces of hard liquor.  Do not use street drugs.  Do not share needles.  Ask your health care provider for help if you need support or information about quitting drugs.  Tell your health care provider if you often feel depressed.  Tell your health care provider if you have ever been abused or do not feel safe at home. Document Released: 04/02/2011 Document Revised: 02/01/2014 Document Reviewed: 08/19/2013 [facility] Patient Information 2015 [place], [place]. This information is not intended to replace advice given to you by your health care provider. Make sure you discuss any questions you have with your health care provider. Diabetes and Standards of Medical Care Diabetes is complicated. You may find that your diabetes team includes a dietitian, nurse, diabetes educator, eye doctor, and more. To help everyone know what is going on and to help you get the care you deserve, the following schedule of care was developed to help keep you on track. Below are the tests, exams, vaccines, medicines, education, and plans you will need. HbA1c test This test shows how well you have controlled your glucose over the past 2-3 months. It is used to see if your diabetes management plan needs to be adjusted.   It is performed at least 2 times a year if you are meeting treatment goals.  It is performed 4 times a year if therapy has changed or if you are not  meeting treatment goals. Blood pressure  test  This test is performed at every routine medical visit. The goal is less than 140/90 mm Hg for most people, but 130/80 mm Hg in some cases. Ask your health care provider about your goal. Dental exam  Follow up with the dentist regularly. Eye exam  If you are diagnosed with type 1 diabetes as a child, get an exam upon reaching the age of 29 years or older and have had diabetes for 3-5 years. Yearly eye exams are recommended after that initial eye exam.  If you are diagnosed with type 1 diabetes as an adult, get an exam within 5 years of diagnosis and then yearly.  If you are diagnosed with type 2 diabetes, get an exam as soon as possible after the diagnosis and then yearly. Foot care exam  Visual foot exams are performed at every routine medical visit. The exams check for cuts, injuries, or other problems with the feet.  A comprehensive foot exam should be done yearly. This includes visual inspection as well as assessing foot pulses and testing for loss of sensation.  Check your feet nightly for cuts, injuries, or other problems with your feet. Tell your health care provider if anything is not healing. Kidney function test (urine microalbumin)  This test is performed once a year.  Type 1 diabetes: The first test is performed 5 years after diagnosis.  Type 2 diabetes: The first test is performed at the time of diagnosis.  A serum creatinine and estimated glomerular filtration rate (eGFR) test is done once a year to assess the level of chronic kidney disease (CKD), if present. Lipid profile (cholesterol, HDL, LDL, triglycerides)  Performed every 5 years for most people.  The goal for LDL is less than 100 mg/dL. If you are at high risk, the goal is less than 70 mg/dL.  The goal for HDL is 40 mg/dL-50 mg/dL for men and 50 mg/dL-60 mg/dL for women. An HDL cholesterol of 60 mg/dL or higher gives some protection against heart disease.  The goal for triglycerides is less than 150  mg/dL. Influenza vaccine, pneumococcal vaccine, and hepatitis B vaccine  The influenza vaccine is recommended yearly.  It is recommended that people with diabetes who are over 31 years old get the pneumonia vaccine. In some cases, two separate shots may be given. Ask your health care provider if your pneumonia vaccination is up to date.  The hepatitis B vaccine is also recommended for adults with diabetes. Diabetes self-management education  Education is recommended at diagnosis and ongoing as needed. Treatment plan  Your treatment plan is reviewed at every medical visit. Document Released: 07/15/2009 Document Revised: 02/01/2014 Document Reviewed: 02/17/2013 [facility] Patient Information 2015 [place], [place]. This information is not intended to replace advice given to you by your health care provider. Make sure you discuss any questions you have with your health care provider.

## 2014-04-26 NOTE — Assessment & Plan Note (Signed)
On statin - Recheck q6-12 mo, titrate as needed for LDL <100

## 2014-04-26 NOTE — Progress Notes (Signed)
Pre visit review using our clinic review tool, if applicable. No additional management support is needed unless otherwise documented below in the visit note. 

## 2014-04-26 NOTE — Assessment & Plan Note (Signed)
On O2 related to interstitial lung dz and OHS  Variable dose of pred as per pulm - but plateaued dose at 10mg  qd

## 2014-04-27 ENCOUNTER — Telehealth: Payer: Self-pay

## 2014-04-27 ENCOUNTER — Other Ambulatory Visit: Payer: Self-pay

## 2014-04-27 DIAGNOSIS — IMO0001 Reserved for inherently not codable concepts without codable children: Secondary | ICD-10-CM

## 2014-04-27 DIAGNOSIS — E1165 Type 2 diabetes mellitus with hyperglycemia: Principal | ICD-10-CM

## 2014-04-27 MED ORDER — INSULIN DETEMIR 100 UNIT/ML FLEXPEN: 0.1500 [IU] | PEN_INJECTOR | Freq: Every day | SUBCUTANEOUS | Status: DC

## 2014-04-27 MED ORDER — CIPROFLOXACIN HCL 500 MG PO TABS: 500.0000 mg | ORAL_TABLET | Freq: Two times a day (BID) | ORAL | Status: DC

## 2014-04-27 NOTE — Addendum Note (Signed)
Addended by: Gwendolyn Grant A on: 04/27/2014 12:34 PM   Modules accepted: Orders

## 2014-04-27 NOTE — Telephone Encounter (Signed)
Ok to change thanks

## 2014-04-27 NOTE — Telephone Encounter (Signed)
Fax came in from pharmacy stating: patient is requesting Levemir Flextouch Pen instead of vial.

## 2014-04-28 ENCOUNTER — Other Ambulatory Visit: Payer: Self-pay

## 2014-04-28 DIAGNOSIS — IMO0001 Reserved for inherently not codable concepts without codable children: Secondary | ICD-10-CM

## 2014-04-28 DIAGNOSIS — E1165 Type 2 diabetes mellitus with hyperglycemia: Principal | ICD-10-CM

## 2014-04-28 MED ORDER — INSULIN PEN NEEDLE 31G X 8 MM MISC: Status: DC

## 2014-04-28 MED ORDER — INSULIN DETEMIR 100 UNIT/ML FLEXPEN: 15.0000 [IU] | PEN_INJECTOR | Freq: Every day | SUBCUTANEOUS | Status: DC

## 2014-04-28 NOTE — Telephone Encounter (Signed)
Called pharm to verify the pen needle needed for the flexpen and to also confirm with pharmacist that MD is okay with pt taking Cipro and Prednisone together.

## 2014-04-30 ENCOUNTER — Other Ambulatory Visit: Payer: Self-pay | Admitting: Internal Medicine

## 2014-05-02 ENCOUNTER — Other Ambulatory Visit: Payer: Self-pay | Admitting: Internal Medicine

## 2014-05-03 ENCOUNTER — Other Ambulatory Visit: Payer: Self-pay

## 2014-05-26 ENCOUNTER — Other Ambulatory Visit: Payer: Self-pay | Admitting: Internal Medicine

## 2014-06-09 ENCOUNTER — Ambulatory Visit (INDEPENDENT_AMBULATORY_CARE_PROVIDER_SITE_OTHER): Payer: BC Managed Care – PPO | Admitting: Internal Medicine

## 2014-06-09 ENCOUNTER — Encounter: Payer: Self-pay | Admitting: Internal Medicine

## 2014-06-09 VITALS — BP 104/78 | HR 94 | Temp 98.5°F | Wt 288.5 lb

## 2014-06-09 DIAGNOSIS — IMO0001 Reserved for inherently not codable concepts without codable children: Secondary | ICD-10-CM

## 2014-06-09 DIAGNOSIS — J8410 Pulmonary fibrosis, unspecified: Secondary | ICD-10-CM

## 2014-06-09 DIAGNOSIS — J209 Acute bronchitis, unspecified: Secondary | ICD-10-CM

## 2014-06-09 DIAGNOSIS — E1165 Type 2 diabetes mellitus with hyperglycemia: Secondary | ICD-10-CM

## 2014-06-09 MED ORDER — HYDROCODONE-HOMATROPINE 5-1.5 MG/5ML PO SYRP: 5.0000 mL | ORAL_SOLUTION | Freq: Four times a day (QID) | ORAL | Status: DC | PRN

## 2014-06-09 MED ORDER — AZITHROMYCIN 250 MG PO TABS: ORAL_TABLET | ORAL | Status: DC

## 2014-06-09 NOTE — Patient Instructions (Signed)
Plain Mucinex (NOT D) for thick secretions ;force NON dairy fluids .   Nasal cleansing in the shower as discussed with lather of mild shampoo.After 10 seconds wash off lather while  exhaling through nostrils. Make sure that all residual soap is removed to prevent irritation.  Flonase OR Nasacort AQ 1 spray in each nostril twice a day as needed. Use the "crossover" technique into opposite nostril spraying toward opposite ear @ 45 degree angle, not straight up into nostril.  Use a Neti pot daily only  as needed for significant sinus congestion; going from open side to congested side . Plain Allegra (NOT D )  160 daily , Loratidine 10 mg , OR Zyrtec 10 mg @ bedtime  as needed for itchy eyes & sneezing.   Eat a low-fat diet with lots of fruits and vegetables, up to 7-9 servings per day. Consume less than  30  Grams (preferably ZERO) of sugar per day from foods & drinks with High Fructose Corn Syrup (HFCS) sugar as #1,2,3 or # 4 on label.[employer], [employer] do not carry products with HFCS. Follow a  low carb nutrition program such as [place] or The New Sugar Busters  to prevent Diabetes progression . White carbohydrates (potatoes, rice, bread, and pasta) have a high spike of sugar and a high load of sugar. For example a  baked potato has a cup of sugar and a  french fry  2 teaspoons of sugar. Yams, wild  rice, whole grained bread &  wheat pasta have been much lower spike and load of  sugar. Portions should be the size of a deck of cards or your palm.

## 2014-06-09 NOTE — Progress Notes (Signed)
Pre visit review using our clinic review tool, if applicable. No additional management support is needed unless otherwise documented below in the visit note. 

## 2014-06-09 NOTE — Progress Notes (Signed)
   Subjective:    Patient ID: [name], female    DOB: 05-19-1971, 43 y.o.   MRN: [id number]  HPI  Patient is seen today for complaints of sinus congestion and sore throat.  States her symptoms began 06/05/14 with "feeling bad" and have progressed to include sore throat, cough occasionally productive of clear sputum, increased shortness of breath and headache between her eyes.    She denies rhinorrhea, post nasal drip, fevers, chills, tooth pain, frontal or maxillary sinus pain, wheezing.   Her medication list included Claritin as well as Mucinex DM - she has not been taking either.  She has been using Flonase daily.  She has not taken anything else over the counter to help with symptoms.   She has uncontrolled type II diabetes (last A1C 04/26/14 11.6).  Levimir was added at that time for which she has been minimally compliant.  She states that her fasting blood sugars range from 110-160's at this time.   She also has interstitial lung disease for which she is on chronic steroid therapy and followed by pulmonary.  She typically wears oxygen with exertion and at night but has had increased shortness of breath over the last several days and has been wearing it continuously.    Review of Systems  Constitutional: Positive for activity change (decreased activity). Negative for fever, chills, appetite change and unexpected weight change.  HENT: Positive for sore throat and trouble swallowing. Negative for ear discharge, ear pain, hearing loss, nosebleeds, postnasal drip and rhinorrhea.   Eyes: Negative for discharge and itching.  Respiratory: Positive for cough (occasionally productive of clear sputum) and shortness of breath (incresaed from baseline). Negative for chest tightness and wheezing.   Cardiovascular: Negative for chest pain, palpitations and leg swelling.  Gastrointestinal: Negative for nausea, vomiting and diarrhea.  Skin: Negative for rash and wound.  Neurological: Positive for  headaches. Negative for dizziness and weakness.       Objective:   Physical Exam  Constitutional: She is oriented to person, place, and time. She appears well-nourished. No distress.  HENT:  Head: Normocephalic and atraumatic.  Right Ear: External ear normal.  Left Ear: External ear normal.  Nose: Right sinus exhibits no maxillary sinus tenderness and no frontal sinus tenderness. Left sinus exhibits no maxillary sinus tenderness and no frontal sinus tenderness.  Mouth/Throat: Posterior oropharyngeal edema (tonsils 2+) and posterior oropharyngeal erythema present. No oropharyngeal exudate.  Nasal mucosa red  Neck: Normal range of motion. Neck supple. No thyromegaly present.  Cardiovascular: Normal rate, regular rhythm and normal heart sounds.   No murmur heard. Pulmonary/Chest: Effort normal.  Decreased breath sounds  Abdominal: Soft. Bowel sounds are normal.  Musculoskeletal: Normal range of motion. She exhibits no edema and no tenderness.  Lymphadenopathy:    She has no cervical adenopathy.  Neurological: She is alert and oriented to person, place, and time. No cranial nerve deficit.  Skin: Skin is warm and dry. She is not diaphoretic.  Psychiatric: She has a normal mood and affect. Her behavior is normal. Judgment and thought content normal.          Assessment & Plan:

## 2014-06-09 NOTE — Progress Notes (Signed)
   Subjective:    Patient ID: [name], female    DOB: [birth year], 43 y.o.   MRN: [id number]  HPI   She presents with sinus congestion & sore throat. Symptoms began 06/05/14 as malaise with subsequent appearance of sore throat and cough intermittently productive of clear sputum. This is associated with shortness of breath and pain between , not above the eyes.  She's been using Claritin as needed and Mucinex DM  on a regular basis. She does use Flonase daily.A Neti pot  nasal lavage system employed on occasion.  Symptoms are present in the context of uncontrolled diabetes. Her last A1c was 11.6% on 04/26/14. Levemir was initiated at that time; she's been irregularly compliant. She states her fasting blood sugars range 110-160.  She also has chronic interstitial lung disease and is on chronic steroid therapy from Pulmonary. She does have supplemental oxygen for exertional dyspnea and wears it at night for nocturnal desaturation. She has been using it continuously for the last several days because of increasing dyspnea (Note: not worn @ this visit; O2 sats 95%).  Positive GI symptoms include some difficulty swallowing.  Review of Systems  She denies persistent frontal headache, facial pain, nasal purulence, otic pain, or otic discharge.  She also is not having postnasal drip and rhinorrhea.  She has no fever, chills, or sweats.        Objective:   Physical Exam   Positive or pertinent physical findings include:  As per CDC Guidelines ,Epic documents morbid obesity as being present . There is erythema of the posterior pharynx without exudate. She has a severe paroxysmal nonproductive cough which is racking. Mild rales lower fields.  General appearance :adequately nourished; in no distress. Eyes: No conjunctival inflammation or scleral icterus is present. Oral exam:  Lips and gums are healthy appearing.There is no oropharyngeal erythema or exudate noted.  Heart:  Normal rate and  regular rhythm. Elevated rate with paroxysmal cough.S1 and S2 normal without gallop, murmur, click, rub or other extra sounds   Lungs: no wheezes, rhonchi,or rubs present.No increased work of breathing except with coughing spells.  Abdomen: Protuberant;bowel sounds normal, soft and non-tender without masses, organomegaly or hernias noted.  No guarding or rebound.  Skin:Warm & dry.  Intact without suspicious lesions or rashes ; no jaundice or tenting Lymphatic: No lymphadenopathy is noted about the head, neck, axilla             Assessment & Plan:  #1 acute bronchitis w/o bronchospasm #2 URI, acute #3 uncontrolled DM; risk of recurrent infection discussed Plan: See orders and recommendations

## 2014-07-16 ENCOUNTER — Other Ambulatory Visit: Payer: Self-pay | Admitting: Internal Medicine

## 2014-08-05 ENCOUNTER — Encounter: Payer: Self-pay | Admitting: Internal Medicine

## 2014-08-13 ENCOUNTER — Ambulatory Visit: Payer: BC Managed Care – PPO | Admitting: Internal Medicine

## 2014-08-19 ENCOUNTER — Encounter: Payer: Self-pay | Admitting: Internal Medicine

## 2014-08-19 ENCOUNTER — Ambulatory Visit (INDEPENDENT_AMBULATORY_CARE_PROVIDER_SITE_OTHER): Payer: BC Managed Care – PPO | Admitting: Internal Medicine

## 2014-08-19 ENCOUNTER — Other Ambulatory Visit (INDEPENDENT_AMBULATORY_CARE_PROVIDER_SITE_OTHER): Payer: BC Managed Care – PPO

## 2014-08-19 VITALS — BP 110/80 | HR 91 | Temp 98.2°F | Ht 60.0 in | Wt 288.5 lb

## 2014-08-19 DIAGNOSIS — E1165 Type 2 diabetes mellitus with hyperglycemia: Secondary | ICD-10-CM

## 2014-08-19 DIAGNOSIS — J8410 Pulmonary fibrosis, unspecified: Secondary | ICD-10-CM

## 2014-08-19 DIAGNOSIS — IMO0002 Reserved for concepts with insufficient information to code with codable children: Secondary | ICD-10-CM

## 2014-08-19 DIAGNOSIS — Z23 Encounter for immunization: Secondary | ICD-10-CM

## 2014-08-19 LAB — HEMOGLOBIN A1C: HEMOGLOBIN A1C: 10.3 % — AB (ref 4.6–6.5)

## 2014-08-19 MED ORDER — TETANUS-DIPHTHERIA TOXOIDS TD 5-2 LFU IM INJ: 0.5000 mL | INJECTION | Freq: Once | INTRAMUSCULAR | Status: DC

## 2014-08-19 NOTE — Assessment & Plan Note (Signed)
On O2 related to interstitial lung dz and OHS  Variable dose of pred as per pulm - but plateaued dose at 10mg  qd Flare 06/2014 reviewed - back to baseline

## 2014-08-19 NOTE — Assessment & Plan Note (Signed)
New dx 11/2012 - a1c 10.0 Started Janumet for same - improved, but taking only once each a.m. Changed to XR 03/2013-  Added Levemir 03/2014 - using for cbg >200- reports fasting cbgs 140w cbgs freq uncontrolled, esp with variable pred dosing related to lung dz continue ARB and statin follow up a1c q 3 months, sooner if problems The patient is asked to make an attempt to improve diet and exercise patterns to aid in medical management of this problem.  Lab Results  Component Value Date   HGBA1C 11.6* 04/26/2014

## 2014-08-19 NOTE — Progress Notes (Signed)
Pre visit review using our clinic review tool, if applicable. No additional management support is needed unless otherwise documented below in the visit note. 

## 2014-08-19 NOTE — Assessment & Plan Note (Signed)
Wt Readings from Last 3 Encounters:  08/19/14 288 lb 8 oz (130.863 kg)  06/09/14 288 lb 8 oz (130.863 kg)  04/26/14 287 lb 4 oz (130.296 kg)   The patient is asked to make an attempt to improve diet and exercise patterns to aid in medical management of this problem. She will investigate bariatric options and continue gym workouts with calorie restriction

## 2014-08-19 NOTE — Progress Notes (Signed)
   Subjective:    Patient ID: [name], female    DOB: 10-18-1970, 43 y.o.   MRN: [id number]  HPI  Patient is here for follow up  Reviewed chronic medical issues and interval medical events  Past Medical History  Diagnosis Date  . Dysphagia   . Allergic rhinitis   . Unspecified essential hypertension   . Mild depression   . Headache(784.0)   . Urge incontinence   . Chronic rhinitis   . GERD (gastroesophageal reflux disease)   . Cough   . Bronchitis   . ILD (interstitial lung disease)   . Morbid obesity   . Type II or diabetes mellitus, uncontrolled 11/2012 dx    Dx 12/08/12: a1c 10.0  . Dyslipidemia     Review of Systems  Constitutional: Positive for fatigue. Negative for unexpected weight change.  Respiratory: Negative for cough, shortness of breath and wheezing.   Cardiovascular: Negative for chest pain, palpitations and leg swelling.  Gastrointestinal: Negative for nausea, abdominal pain and diarrhea.  Endocrine: Positive for polydipsia.  Genitourinary: Positive for frequency. Negative for dysuria, urgency and flank pain.  Neurological: Negative for dizziness, weakness, light-headedness and headaches.  Psychiatric/Behavioral: Negative for dysphoric mood. The patient is not nervous/anxious.   All other systems reviewed and are negative.      Objective:   Physical Exam  BP 110/80 mmHg  Pulse 91  Temp(Src) 98.2 F (36.8 C) (Oral)  Ht 5' (1.524 m)  Wt 288 lb 8 oz (130.863 kg)  BMI 56.34 kg/m2  SpO2 99% Wt Readings from Last 3 Encounters:  08/19/14 288 lb 8 oz (130.863 kg)  06/09/14 288 lb 8 oz (130.863 kg)  04/26/14 287 lb 4 oz (130.296 kg)   Constitutional: She is MO, appears well-developed and well-nourished. No distress.  Neck: Normal range of motion. Neck supple. No JVD present. No thyromegaly present.  Cardiovascular: Normal rate, regular rhythm and normal heart sounds.  No murmur heard. No BLE edema. Pulmonary/Chest: Effort normal and breath sounds  normal. No respiratory distress. She has no wheezes.  Psychiatric: She has a normal mood and affect. Her behavior is normal. Judgment and thought content normal.   Lab Results  Component Value Date   WBC 8.3 04/26/2014   HGB 13.4 04/26/2014   HCT 41.5 04/26/2014   PLT 195.0 04/26/2014   GLUCOSE 272* 04/26/2014   CHOL 172 04/26/2014   TRIG 61.0 04/26/2014   HDL 43.10 04/26/2014   LDLDIRECT 153.1 12/08/2012   LDLCALC 117* 04/26/2014   ALT 55* 04/26/2014   AST 31 04/26/2014   NA 136 04/26/2014   K 3.9 04/26/2014   CL 99 04/26/2014   CREATININE 0.6 04/26/2014   BUN 11 04/26/2014   CO2 29 04/26/2014   TSH 0.87 04/26/2014   HGBA1C 11.6* 04/26/2014   MICROALBUR 0.5 04/26/2014    Dg Chest 2 View  10/06/2013   CLINICAL DATA:  Follow-up pulmonary fibrosis.  Cough.  EXAM: CHEST  2 VIEW  COMPARISON:  Chest radiograph 06/05/2012.  FINDINGS: Stable cardiac and mediastinal contours. Low lung volumes. Unchanged bilateral lower lung linear opacities. No large consolidative pulmonary opacities. No pleural effusion or pneumothorax.  IMPRESSION: Unchanged chronic bibasilar opacities likely representing scarring.   Electronically Signed   By: Lovey Newcomer M.D.   On: 10/06/2013 11:01       Assessment & Plan:

## 2014-08-19 NOTE — Patient Instructions (Addendum)
It was good to see you today.  We have reviewed your prior records including labs and tests today  Your annual flu shot was given and/or updated today.  Test(s) ordered today. Your results will be released to [place] (or called to you) after review, usually within 72hours after test completion. If any changes need to be made, you will be notified at that same time.  Medications reviewed and updated, no changes recommended at this time. Refill on medication(s) as discussed today.  Continue your work on lifestyle changes as discussed (low fat, low carb, increased protein diet; improved exercise efforts; weight loss) to control sugar, blood pressure and cholesterol levels and/or reduce risk of developing other medical problems. Look into [email or web address] or other type of food journal to assist you in this process.  Please schedule followup in 3-4 months, call sooner if problems.  Diabetes and Exercise Exercising regularly is important. It is not just about losing weight. It has many health benefits, such as:  Improving your overall fitness, flexibility, and endurance.  Increasing your bone density.  Helping with weight control.  Decreasing your body fat.  Increasing your muscle strength.  Reducing stress and tension.  Improving your overall health. People with diabetes who exercise gain additional benefits because exercise:  Reduces appetite.  Improves the body's use of blood sugar (glucose).  Helps lower or control blood glucose.  Decreases blood pressure.  Helps control blood lipids (such as cholesterol and triglycerides).  Improves the body's use of the hormone insulin by:  Increasing the body's insulin sensitivity.  Reducing the body's insulin needs.  Decreases the risk for heart disease because exercising:  Lowers cholesterol and triglycerides levels.  Increases the levels of good cholesterol (such as high-density lipoproteins [HDL]) in the body.  Lowers blood  glucose levels. YOUR ACTIVITY PLAN  Choose an activity that you enjoy and set realistic goals. Your health care provider or diabetes educator can help you make an activity plan that works for you. Exercise regularly as directed by your health care provider. This includes:  Performing resistance training twice a week such as push-ups, sit-ups, lifting weights, or using resistance bands.  Performing 150 minutes of cardio exercises each week such as walking, running, or playing sports.  Staying active and spending no more than 90 minutes at one time being inactive. Even short bursts of exercise are good for you. Three 10-minute sessions spread throughout the day are just as beneficial as a single 30-minute session. Some exercise ideas include:  Taking the dog for a walk.  Taking the stairs instead of the elevator.  Dancing to your favorite song.  Doing an exercise video.  Doing your favorite exercise with a friend. RECOMMENDATIONS FOR EXERCISING WITH TYPE 1 OR TYPE 2 DIABETES   Check your blood glucose before exercising. If blood glucose levels are greater than 240 mg/dL, check for urine ketones. Do not exercise if ketones are present.  Avoid injecting insulin into areas of the body that are going to be exercised. For example, avoid injecting insulin into:  The arms when playing tennis.  The legs when jogging.  Keep a record of:  Food intake before and after you exercise.  Expected peak times of insulin action.  Blood glucose levels before and after you exercise.  The type and amount of exercise you have done.  Review your records with your health care provider. Your health care provider will help you to develop guidelines for adjusting food intake and insulin amounts before and after  exercising.  If you take insulin or oral hypoglycemic agents, watch for signs and symptoms of hypoglycemia. They include:  Dizziness.  Shaking.  Sweating.  Chills.  Confusion.  Drink  plenty of water while you exercise to prevent dehydration or heat stroke. Body water is lost during exercise and must be replaced.  Talk to your health care provider before starting an exercise program to make sure it is safe for you. Remember, almost any type of activity is better than none. Document Released: 12/08/2003 Document Revised: 02/01/2014 Document Reviewed: 02/24/2013 [facility] Patient Information 2015 [place], [place]. This information is not intended to replace advice given to you by your health care provider. Make sure you discuss any questions you have with your health care provider.

## 2014-09-21 ENCOUNTER — Other Ambulatory Visit: Payer: Self-pay | Admitting: Internal Medicine

## 2014-10-06 ENCOUNTER — Ambulatory Visit (INDEPENDENT_AMBULATORY_CARE_PROVIDER_SITE_OTHER): Payer: BLUE CROSS/BLUE SHIELD | Admitting: Internal Medicine

## 2014-10-06 ENCOUNTER — Encounter: Payer: Self-pay | Admitting: Internal Medicine

## 2014-10-06 VITALS — BP 140/90 | HR 94 | Temp 98.3°F | Ht 60.0 in | Wt 287.6 lb

## 2014-10-06 DIAGNOSIS — J8410 Pulmonary fibrosis, unspecified: Secondary | ICD-10-CM

## 2014-10-06 MED ORDER — ESOMEPRAZOLE MAGNESIUM 20 MG PO CPDR: DELAYED_RELEASE_CAPSULE | ORAL | Status: DC

## 2014-10-06 NOTE — Patient Instructions (Addendum)
Resume treadmill x 30 min daily minimum  If  doing great try to reduce prednisone to 10 mg alternating with 5 mg daily as your new floor but increase back to ceiling of 20 mg per day if needed  Please schedule a follow up visit in 3 months but call sooner if needed for pfts in return  Late add: inquire re 02 use next ov and do walking sats on/off 02 then also

## 2014-10-06 NOTE — Progress Notes (Signed)
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  Since onset waxing/waning sx requiring intermittent prednisone tapers with only minimal improvement - most of her symptoms chronically have been related to obesity with dyspnea on exertion and tendency to chronic cough which is felt to be at least partly  reflux related.      History of Present Illness  November 07, 2010 ov cc cough/ strangling  a bit more with taper off reglan (actually worse before ran out of the low dose reglan)  no excess mucus or increase sob on prednisone @  10 mg daily .  Using med calendar well rec 1)  Ok to resume water aerobics but pace yourself 2)  Wait until your swallowing evaulation is complete before making a decision re reglan > stopped it on her own.  3)  Try Prednisone 10 mg one-half each am   04/04/2012 f/u ov/Anvika Gashi no longer using med calendar on Pred 10 mg one daily  added hyzaar to rx for hbp/leg swelling not back to mall walking yet,  No unusual cough, purulent sputum or sinus/hb symptoms on present rx. No variability to symptoms or need for inhaler. Rec Try prednisone 10 mg one half daily to see if affects your ability to walk the mall on 02 or the cough gets worse (walk the mall first for at least before you try) Always walk for exercise on 3lpm - this will help you burn fat and get into negative calorie balance to lose weight.   03/17/2013 f/u ov/Shina Wass re NSIP on floor of 10 mg per day Chief Complaint  Patient presents with  . Follow-up    Breathing is overall doing well and she denies any new co's today.   ex on a treadmill x 40m x 3 x daily on 2lpm not the 3lpm as rec >>Prednisone ceiling is 20 mg per day and floor is 10 mg per day   06/15/2013 Follow up and Med review  Returns for follow up and  Med review .  Appears to be taking her med  correctly .  We reviewed all her meds and organized them into a med calendar with pt education.  Does report some increased dry coughing over the weekend, hoarseness, scratchy throat, PND.   No fever or discolored mucus .  rec Follow med calendar closely and bring to each visit.  May increase prednisone to 20 mg with flare of cough -When better. Return to 10 mg daily and hold at this dose. May use Claritin daily as needed. For postnasal drainage, and nasal drip. May use Mucinex DM as needed. For cough. May, and tramadol 1-2 every 4 hours as needed. For cough control      10/06/2013 f/u ov/Lacreasha Hinds re: NSIP/ pred dep holding at 10 mg per day floor, no need for increase since last ov Chief Complaint  Patient presents with  . Follow-up    Using 02 up to 3lpm with ex  rec Please remember to go to the  x-ray department downstairs for your tests - we will call you with the results when they are available. 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 Followup with PFT  3lpm  X 30 min on treadmill at 2.2- 2.4 flat x 2-3 x per week  rec After 2 weeks of regular/routine treadmill work then If  doing great try to reduce prednisone to 10 mg alternating with 5 mg daily as your new floor but increase back to ceiling of 20 mg per day if needed   10/06/2014 f/u ov/Ange Puskas re: pred 10 mg daily after trying 10/5 until Tgiving and worse cough/ sob  Chief Complaint  Patient presents with  . Follow-up    F/U ILD; breathing doing fine; no complaints  Not doing treadmill  Any more but Not limited by breathing from desired activities    No obvious day to day or daytime variabilty or assoc chronic cough or cp or chest tightness, subjective wheeze overt sinus or hb symptoms. No unusual exp hx  or h/o childhood pna/ asthma or knowledge of premature birth.  Sleeping ok without nocturnal  or early am exacerbation  of respiratory  c/o's or need for noct saba. Also denies any obvious fluctuation of symptoms with weather or environmental changes or other aggravating or alleviating factors except as outlined above   Current Medications, Allergies, Complete Past Medical History, Past Surgical History, Family History, and Social History were reviewed in [employer] record.  ROS  The following are not active complaints unless bolded sore throat, dysphagia, dental problems, itching, sneezing,  nasal congestion or excess/ purulent secretions, ear ache,   fever, chills, sweats, unintended wt loss, pleuritic or exertional cp, hemoptysis,  orthopnea pnd or leg swelling, presyncope, palpitations, heartburn, abdominal pain, anorexia, nausea, vomiting, diarrhea  or change in bowel or urinary habits, change in stools or urine, dysuria,hematuria,  rash, arthralgias, visual complaints, headache, numbness weakness or ataxia or problems with walking or coordination,  change in mood/affect or memory.             Past Medical History: GASTROESOPHAGEAL REFLUX DISEASE     - Tapered reglan to 10 mg one half twice daily June 22, 2010 > d/c'd 11/2010  -restarted reglan 03/2011 >>unable to tolerate.  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)   -Restart Prednisone 02/24/08   -PFT's December 23, 2008 VC 35%  DLC0 31%   -Desats with > 2 laps 06/22/10 so rec wear 02 2lpm at bedtime and with ex  MORBID OBESITY   - Target wt  =  153  for BMI < 30  Hypertension Health Maintenance..........................................................Marland KitchenMarland KitchenAsa Lente    - Pneumovax July 22, 2009      - Flu 06/21/2011  Complex med regimen 06/21/2011 , 06/15/2013              Objective:   Physical Exam  Massively  obese pleasant amb bf nad  wt  305 May 28, 2008 >   302 November 07, 2010 >   303 03/22/2011 > 10/25/2011  299 > 311 04/04/2012 > 306 03/17/2013 >06/15/2013 303 > 10/06/2013 298  > 02/08/2014 289 > 10/06/2014   288   HEENT: nl dentition, turbinates, and orophanx. Nl external ear canals without cough reflex Neck without JVD/Nodes/TM Lungs distant bs,  Distant BS w/ no wheezing / no cough on insp RRR no s3 or murmur or increase in P2. no edema Abd soft and benign with nl excursion in the supine position. Ext warm without calf  tenderness, cyanosis clubbing        Assessment & Plan:

## 2014-10-23 ENCOUNTER — Encounter: Payer: Self-pay | Admitting: Internal Medicine

## 2014-10-23 NOTE — Assessment & Plan Note (Addendum)
-  VATS 02/15/2004 NSIP (Katzenstein reviewed/confirmed)   -Restarted Prednisone 02/24/08   -PFT's December 23, 2008 VC 35%  DLC0 31%   -PFT's 02/08/2014   VC 1.30 (49%) and DLCO is 70%  > rec pred 10/5 > did not tolerate   The goal with a chronic steroid dependent illness is always arriving at the lowest effective dose that controls the disease/symptoms and not accepting a set "formula" which is based on statistics or guidelines that don't always take into account patient  variability or the natural hx of the dz in every individual patient, which may well vary over time.  For now therefore I recommend the patient maintain  10 mg daily unless doing great then ok to rechallenge with 10/5

## 2014-10-28 ENCOUNTER — Other Ambulatory Visit: Payer: Self-pay | Admitting: Internal Medicine

## 2014-11-04 ENCOUNTER — Telehealth: Payer: Self-pay | Admitting: Internal Medicine

## 2014-11-04 NOTE — Telephone Encounter (Signed)
Patient's husband was in a very bad motorcycle accident on Saturday and is in intensive care at [facility].  She is very upset and cannot sleep.  She would like to know if you can prescribe her something to help her sleep.  Please advise.

## 2014-11-05 MED ORDER — ZOLPIDEM TARTRATE 5 MG PO TABS: 5.0000 mg | ORAL_TABLET | Freq: Every evening | ORAL | Status: DC | PRN

## 2014-11-05 NOTE — Telephone Encounter (Signed)
rx faxed to pharmacy.  Spoke to pt and gave MD advisement.

## 2014-11-05 NOTE — Telephone Encounter (Signed)
Given her breathing problems will do short term [place]. Rx printed for 1 week supply. If still having problems needs visit.

## 2014-11-09 ENCOUNTER — Other Ambulatory Visit: Payer: Self-pay | Admitting: Internal Medicine

## 2014-11-18 ENCOUNTER — Ambulatory Visit: Payer: BC Managed Care – PPO | Admitting: Internal Medicine

## 2015-02-01 ENCOUNTER — Other Ambulatory Visit: Payer: Self-pay | Admitting: Internal Medicine

## 2015-02-02 ENCOUNTER — Other Ambulatory Visit: Payer: Self-pay | Admitting: Internal Medicine

## 2015-02-02 DIAGNOSIS — J8410 Pulmonary fibrosis, unspecified: Secondary | ICD-10-CM

## 2015-02-09 ENCOUNTER — Telehealth: Payer: Self-pay | Admitting: Internal Medicine

## 2015-02-09 NOTE — Telephone Encounter (Signed)
done

## 2015-02-09 NOTE — Telephone Encounter (Signed)
Form in Dr Gustavus Bryant lookat to be signed

## 2015-02-10 NOTE — Telephone Encounter (Signed)
Form faxed on 02/09/15  [facility] for the pt to be made aware

## 2015-03-09 ENCOUNTER — Encounter: Payer: Self-pay | Admitting: Internal Medicine

## 2015-03-09 ENCOUNTER — Ambulatory Visit (INDEPENDENT_AMBULATORY_CARE_PROVIDER_SITE_OTHER): Payer: BC Managed Care – PPO | Admitting: Internal Medicine

## 2015-03-09 VITALS — BP 112/86 | HR 94 | Ht 60.0 in | Wt 283.0 lb

## 2015-03-09 DIAGNOSIS — J45991 Cough variant asthma: Secondary | ICD-10-CM

## 2015-03-09 DIAGNOSIS — J9611 Chronic respiratory failure with hypoxia: Secondary | ICD-10-CM

## 2015-03-09 DIAGNOSIS — J8410 Pulmonary fibrosis, unspecified: Secondary | ICD-10-CM

## 2015-03-09 LAB — PULMONARY FUNCTION TEST
DL/VA % pred: 171 %
DL/VA: 7.28 ml/min/mmHg/L
DLCO unc % pred: 72 %
DLCO unc: 13.66 ml/min/mmHg
FEF 25-75 Post: 1.85 L/sec
FEF 25-75 Pre: 1.26 L/sec
FEF2575-%Change-Post: 46 %
FEF2575-%PRED-POST: 75 %
FEF2575-%Pred-Pre: 51 %
FEV1-%Change-Post: 14 %
FEV1-%Pred-Post: 54 %
FEV1-%Pred-Pre: 47 %
FEV1-Post: 1.15 L
FEV1-Pre: 1.01 L
FEV1FVC-%Change-Post: 9 %
FEV1FVC-%Pred-Pre: 100 %
FEV6-%Change-Post: 2 %
FEV6-%PRED-POST: 49 %
FEV6-%Pred-Pre: 47 %
FEV6-POST: 1.25 L
FEV6-Pre: 1.21 L
FEV6FVC-%Change-Post: 0 %
FEV6FVC-%PRED-POST: 102 %
FEV6FVC-%PRED-PRE: 103 %
FVC-%CHANGE-POST: 3 %
FVC-%PRED-POST: 48 %
FVC-%Pred-Pre: 46 %
FVC-PRE: 1.21 L
FVC-Post: 1.26 L
POST FEV6/FVC RATIO: 99 %
Post FEV1/FVC ratio: 92 %
Pre FEV1/FVC ratio: 83 %
Pre FEV6/FVC Ratio: 100 %
RV % pred: 60 %
RV: 0.87 L
TLC % pred: 48 %
TLC: 2.14 L

## 2015-03-09 MED ORDER — MOMETASONE FURO-FORMOTEROL FUM 100-5 MCG/ACT IN AERO: INHALATION_SPRAY | RESPIRATORY_TRACT | Status: DC

## 2015-03-09 NOTE — Progress Notes (Signed)
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  Since onset waxing/waning sx requiring intermittent prednisone tapers with only minimal improvement - most of her symptoms chronically have been related to obesity with dyspnea on exertion and tendency to chronic cough which is felt to be at least partly  reflux related but improved with saba 03/09/2015 so started on dulera 100 2bid in addition to maint pred     History of Present Illness  November 07, 2010 ov cc cough/ strangling  a bit more with taper off reglan (actually worse before ran out of the low dose reglan)  no excess mucus or increase sob on prednisone @  10 mg daily .  Using med calendar well rec 1)  Ok to resume water aerobics but pace yourself 2)  Wait until your swallowing evaulation is complete before making a decision re reglan > stopped it on her own.  3)  Try Prednisone 10 mg one-half each am   04/04/2012 f/u ov/Jhan Conery no longer using med calendar on Pred 10 mg one daily  added hyzaar to rx for hbp/leg swelling not back to mall walking yet,  No unusual cough, purulent sputum or sinus/hb symptoms on present rx. No variability to symptoms or need for inhaler. Rec Try prednisone 10 mg one half daily to see if affects your ability to walk the mall on 02 or the cough gets worse (walk the mall first for at least before you try) Always walk for exercise on 3lpm - this will help you burn fat and get into negative calorie balance to lose weight.   03/17/2013 f/u ov/Amour Trigg re NSIP on floor of 10 mg per day Chief Complaint  Patient presents with  . Follow-up    Breathing is overall doing well and she denies any new co's today.   ex on a treadmill x 53m x 3 x daily on 2lpm not the 3lpm as rec >>Prednisone ceiling is 20 mg per day and floor is 10 mg per day        10/06/2014 f/u ov/Jaylynne Birkhead  re: pred 10 mg daily after trying 10/5 until Tgiving and worse cough/ sob  Chief Complaint  Patient presents with  . Follow-up    F/U ILD; breathing doing fine; no complaints  Not doing treadmill  Any more but Not limited by breathing from desired activities   rec Resume treadmill x 30 min daily minimum If  doing great try to reduce prednisone to 10 mg alternating with 5 mg daily as your new floor but increase back to ceiling of 20 mg per day if needed     03/09/2015 f/u ov/Keyairra Kolinski re: NSIP  pred 10 mg daily  Chief Complaint  Patient presents with  . Follow-up    PFT done today. Pt states her breathing is overall doing well, unless she gets exposed to hot/humid weather. She is taking 10 mg pred daily.   2lpm sleep/ 3 lpm ex , flare of cough with < 10 mg per day pred, better cough p albuterol   No obvious day to day or daytime variabilty or assoc  cp or chest tightness, subjective wheeze overt sinus or hb symptoms. No unusual exp hx or h/o childhood pna/ asthma or knowledge of premature birth.  Sleeping ok on 02 2lpm  without nocturnal  or early am exacerbation  of respiratory  c/o's or need for noct saba. Also denies any obvious fluctuation of symptoms with weather or environmental changes or other aggravating or alleviating factors except as outlined above   Current Medications, Allergies, Complete Past Medical History, Past Surgical History, Family History, and Social History were reviewed in [employer] record.  ROS  The following are not active complaints unless bolded sore throat, dysphagia, dental problems, itching, sneezing,  nasal congestion or excess/ purulent secretions, ear ache,   fever, chills, sweats, unintended wt loss, pleuritic or exertional cp, hemoptysis,  orthopnea pnd or leg swelling, presyncope, palpitations, heartburn, abdominal pain, anorexia, nausea, vomiting, diarrhea  or change in bowel or urinary habits, change in stools or urine,  dysuria,hematuria,  rash, arthralgias, visual complaints, headache, numbness weakness or ataxia or problems with walking or coordination,  change in mood/affect or memory.             Past Medical History: GASTROESOPHAGEAL REFLUX DISEASE     - Tapered reglan to 10 mg one half twice daily June 22, 2010 > d/c'd 11/2010  -restarted reglan 03/2011 >>unable to tolerate.  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)   -Restart Prednisone 02/24/08   -PFT's December 23, 2008 VC 35%  DLC0 31%   -Desats with > 2 laps 06/22/10 so rec wear 02 2lpm at bedtime and with ex  MORBID OBESITY   - Target wt  =  153  for BMI < 30  Hypertension Health Maintenance..........................................................Marland KitchenMarland KitchenAsa Lente    - Pneumovax July 22, 2009      - Flu 06/21/2011  Complex med regimen 06/21/2011 , 06/15/2013              Objective:   Physical Exam  Massively obese pleasant amb bf nad   wt  305 May 28, 2008 >   302 November 07, 2010 >   303 03/22/2011 > 10/25/2011  299 > 311 04/04/2012 > 306 03/17/2013 >06/15/2013 303 > 10/06/2013 298  > 02/08/2014 289 > 10/06/2014   288 > 03/09/2015 283   HEENT: nl dentition, turbinates, and orophanx. Nl external ear canals without cough reflex Neck without JVD/Nodes/TM Lungs distant bs,  Distant BS w/ no wheezing / no cough on insp RRR no s3 or murmur or increase in P2. no edema Abd soft and benign with nl excursion in the supine position. Ext warm without calf tenderness, cyanosis clubbing        Assessment & Plan:

## 2015-03-09 NOTE — Patient Instructions (Signed)
Work on inhaler technique:  relax and gently blow all the way out then take a nice smooth deep breath back in, triggering the inhaler at same time you start breathing in.  Hold for up to 5 seconds if you can.  Rinse and gargle with water when done    Try dulera 100 Take 2 puffs first thing in am and then another 2 puffs about 12 hours later.    and if helps the breathing or coughing ok to continue 2 pffs each am and take the pm dose only if needed  Please schedule a follow up visit in 3 months but call sooner if needed

## 2015-03-09 NOTE — Assessment & Plan Note (Signed)
-   PFTs.  03/09/2015  14% better p B2 and less cough p saba - 03/09/2015 p extensive coaching HFA effectiveness =    75% > try dulera 100 2bid

## 2015-03-09 NOTE — Assessment & Plan Note (Signed)
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)   -Restarted Prednisone 02/24/08   -PFT's December 23, 2008 VC 35%  DLC0 31%   -PFT's 02/08/2014   VC 1.30 (49%) and DLCO is 70%  > rec pred 10/5 > did not tolerate   -PFTs  03/09/2015     VC 1.27 (48%) and dlco is 72% @ 10 mg daily    The goal with a chronic steroid dependent illness is always arriving at the lowest effective dose that controls the disease/symptoms and not accepting a set "formula" which is based on statistics or guidelines that don't always take into account patient  variability or the natural hx of the dz in every individual patient, which may well vary over time.  For now therefore I recommend the patient maintain  Ceiling of 20 mg and floor of 10/5   Each maintenance medication was reviewed in detail including most importantly the difference between maintenance and as needed and under what circumstances the prns are to be used.  Please see instructions for details which were reviewed in writing and the patient given a copy.

## 2015-03-09 NOTE — Progress Notes (Signed)
PFT done today. 

## 2015-03-09 NOTE — Assessment & Plan Note (Signed)
-   Started on 02 06/22/10   - 04/05/2012  Walked 2lpm x 3 laps @ 185 ft each stopped due to  desat corrected on 3lpm so rec 3lpm with exertion  - 10/06/2013  Walked RA x 2 laps @ 185 ft each stopped due to  Cough and desat to 86% at more rapid pace than usual and on 3lpm could do 3 laps s difficulty   rx  As of 03/09/2015 = no 02 at rest, 3lpm with exertion, 2lpm at hs

## 2015-03-28 ENCOUNTER — Other Ambulatory Visit: Payer: Self-pay

## 2015-05-16 ENCOUNTER — Ambulatory Visit: Payer: BLUE CROSS/BLUE SHIELD | Admitting: Internal Medicine

## 2015-05-25 ENCOUNTER — Other Ambulatory Visit: Payer: Self-pay | Admitting: Internal Medicine

## 2015-05-26 ENCOUNTER — Other Ambulatory Visit: Payer: Self-pay | Admitting: Internal Medicine

## 2015-05-27 ENCOUNTER — Telehealth: Payer: Self-pay | Admitting: Internal Medicine

## 2015-05-27 NOTE — Telephone Encounter (Signed)
Form placed in Dr. Gustavus Bryant lookat for completion

## 2015-05-27 NOTE — Telephone Encounter (Signed)
Patient dropped off forms to be signed  Patient notified that we are waiting for Dr. Melvyn Novas to sign and we will contact her as soon as they are ready  To Magda Paganini for follow up

## 2015-05-30 NOTE — Telephone Encounter (Signed)
Magda Paganini - do you have the forms?   Please advise.

## 2015-05-30 NOTE — Telephone Encounter (Signed)
[name] - has this form been completed?   Ok to close encounter?   Please advise.

## 2015-05-30 NOTE — Telephone Encounter (Signed)
Dr Melvyn Novas please advise once form is done, thanks

## 2015-05-30 NOTE — Telephone Encounter (Signed)
Completed 05/30/2015

## 2015-05-31 NOTE — Telephone Encounter (Signed)
Forms completed and faxed and originals mailed to the pt  She is aware and nothing further needed

## 2015-06-18 ENCOUNTER — Other Ambulatory Visit: Payer: Self-pay | Admitting: Internal Medicine

## 2015-07-06 ENCOUNTER — Encounter: Payer: Self-pay | Admitting: Adult Health

## 2015-07-06 ENCOUNTER — Ambulatory Visit (INDEPENDENT_AMBULATORY_CARE_PROVIDER_SITE_OTHER): Payer: BC Managed Care – PPO | Admitting: Adult Health

## 2015-07-06 VITALS — BP 110/80 | HR 118 | Temp 98.4°F | Ht 60.0 in | Wt 283.7 lb

## 2015-07-06 DIAGNOSIS — R599 Enlarged lymph nodes, unspecified: Secondary | ICD-10-CM

## 2015-07-06 DIAGNOSIS — R591 Generalized enlarged lymph nodes: Secondary | ICD-10-CM | POA: Diagnosis not present

## 2015-07-06 MED ORDER — DOXYCYCLINE HYCLATE 100 MG PO CAPS
100.0000 mg | ORAL_CAPSULE | Freq: Two times a day (BID) | ORAL | Status: DC
Start: 2015-07-06 — End: 2015-09-19

## 2015-07-06 NOTE — Progress Notes (Signed)
Subjective:    Patient ID: [name], female    DOB: 1971-09-15, 44 y.o.   MRN: [id number]  HPI  44 year old female who presents to the office today for swollen lymph node for one day. The pain was worse last night and was really bad when she was chewing dinner, no pain with swallowing, just chewing.  . She feels fatigued, endorses sinus congestion and headache and ear pain, with subjective fever. She denies n/v/d. Denies any recent dental work.   She took 600 mg of Ibuprofen lat night with helped.   Review of Systems  Constitutional: Positive for fever, activity change and fatigue.  HENT: Positive for congestion and sinus pressure. Negative for dental problem, ear pain, postnasal drip, rhinorrhea, sore throat and trouble swallowing.   Respiratory: Negative.   Cardiovascular: Negative.   Skin: Negative.   Neurological: Negative.   All other systems reviewed and are negative.  Past Medical History  Diagnosis Date  . Dysphagia   . Allergic rhinitis   . Unspecified essential hypertension   . Mild depression   . Headache(784.0)   . Urge incontinence   . Chronic rhinitis   . GERD (gastroesophageal reflux disease)   . Cough   . Bronchitis   . ILD (interstitial lung disease) ([place])   . Morbid obesity ([place])   . Type II or diabetes mellitus, uncontrolled 11/2012 dx    Dx 12/08/12: a1c 10.0  . Dyslipidemia     Social History   Social History  . Marital Status: Married    Spouse Name: N/A  . Number of Children: 1  . Years of Education: N/A   Occupational History  . 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 Smoking status: Never Smoker   . 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 Alcohol Use: No  . Drug Use: No  . Sexual Activity: Not on file   Other Topics Concern  . Not on file   Social History Narrative     Past Surgical History  Procedure Laterality Date  . Bilateral vats ablation  02/15/04    Family History  Problem Relation Age of Onset  . Sarcoidosis Mother     dxed by transbrochial bx  . Allergies Mother   . Heart disease Father   . Diabetes Father   . Allergies Father   . Coronary artery disease Father   . Cancer Maternal Grandmother     CA of unknown type  . Cancer Paternal Grandmother     CA of unknown type    Allergies  Allergen Reactions  . Penicillins     Current Outpatient Prescriptions on File Prior to Visit  Medication Sig Dispense Refill  . atorvastatin (LIPITOR) 20 MG tablet TAKE 1 TABLET (20 MG TOTAL) BY MOUTH DAILY AT 6 PM. 90 tablet 2  . B-D ULTRAFINE III SHORT PEN 31G X 8 MM MISC USE NEEDLE AS NEEDED TO INJECTED INSULIN AS PRESCRIBED BY PHYSICIAN 100 each 2  . Blood Glucose Monitoring Suppl (ONE TOUCH ULTRA MINI) W/DEVICE KIT Use as directed to check blood sugar twice a day. Dx 250.00 1 each 0  . esomeprazole (NEXIUM) 20 MG capsule Take 30- 60 min before your first and last meals of the day 60 capsule 11  . fluticasone (FLONASE)  50 MCG/ACT nasal spray Place 2 sprays into the nose daily as needed for rhinitis. 16 g 5  . hydrochlorothiazide (MICROZIDE) 12.5 MG capsule TAKE 1 CAPSULE BY MOUTH DAILY AS NEEDED 30 capsule 2  . Insulin Detemir (LEVEMIR FLEXPEN) 100 UNIT/ML Pen Inject 15 Units into the skin daily at 10 pm. 15 mL 11  . JANUMET XR [phone] MG TB24 TAKE 1 TABLET BY MOUTH DAILY. 90 tablet 2  . JANUMET XR [phone] MG TB24 TAKE 1 TABLET BY MOUTH DAILY. 90 tablet 2  . LEVEMIR FLEXTOUCH 100 UNIT/ML Pen INJECT 15 UNITS INTO THE SKIN DAILY AT 10 PM. 15 mL 10  . loratadine (CLARITIN) 10 MG tablet Take 10 mg by mouth daily as needed (nasal drainage, drip).    . ONE TOUCH LANCETS MISC Use as directed to help check blood sugars twice daily. Dx 250.00 60 each 5  . ONE TOUCH ULTRA TEST test strip USE TO CHECK BLOOD SUGARS TWICE DAILY 100 each 4  . OXYGEN-HELIUM  IN Inhale 2-3 L into the lungs daily.     . predniSONE (DELTASONE) 10 MG tablet TAKE 1 TABLET (10 MG TOTAL) BY MOUTH DAILY. 90 tablet 0  . famotidine (PEPCID) 20 MG tablet Take 20 mg by mouth at bedtime.    Marland Kitchen losartan (COZAAR) 100 MG tablet TAKE 1 TABLET (100 MG TOTAL) BY MOUTH DAILY. (Patient not taking: Reported on 07/06/2015) 90 tablet 2  . mometasone-formoterol (DULERA) 100-5 MCG/ACT AERO Take 2 puffs first thing in am and then another 2 puffs about 12 hours later. (Patient not taking: Reported on 07/06/2015) 1 Inhaler 11  . oxymetazoline (AFRIN) 0.05 % nasal spray 2 puffs twice daily as needed for 5 days (before Fluticasone)    . tolterodine (DETROL LA) 2 MG 24 hr capsule TAKE 1 CAPSULE (2 MG TOTAL) BY MOUTH DAILY. (Patient not taking: Reported on 07/06/2015) 30 capsule 5  . zolpidem (AMBIEN) 5 MG tablet Take 1 tablet (5 mg total) by mouth at bedtime as needed for sleep. (Patient not taking: Reported on 07/06/2015) 7 tablet 0  . [DISCONTINUED] DETROL LA 2 MG 24 hr capsule TAKE 1 CAPSULE BY MOUTH DAILY 30 capsule 10   No current facility-administered medications on file prior to visit.    BP 110/80 mmHg  Pulse 118  Temp(Src) 98.4 F (36.9 C) (Oral)  Ht 5' (1.524 m)  Wt 283 lb 11.2 oz (128.685 kg)  BMI 55.41 kg/m2  SpO2 97%       Objective:   Physical Exam  Constitutional: She is oriented to person, place, and time. She appears well-developed and well-nourished. No distress.  Appears tired and worn out.   HENT:  Head: Normocephalic and atraumatic.  Right Ear: External ear normal.  Left Ear: External ear normal.  Nose: Nose normal.  Mouth/Throat: Oropharynx is clear and moist. No oropharyngeal exudate.  Has swollen, tender lymph node on left side - Deep cervical She has a large neck, it is hard to tell if she has any swelling but it appears as she does not.  No swelling in mouth TM's visualized, no signs of infection  Eyes: Conjunctivae and EOM are normal. Pupils are equal,  round, and reactive to light. Right eye exhibits no discharge. Left eye exhibits no discharge.  Neck: Normal range of motion. Neck supple.  Cardiovascular: Normal rate, regular rhythm, normal heart sounds and intact distal pulses.  Exam reveals no gallop and no friction rub.   No murmur heard. Pulmonary/Chest: Effort normal and breath  sounds normal. No respiratory distress. She has no wheezes. She has no rales.  Musculoskeletal: Normal range of motion. She exhibits no edema or tenderness.  Lymphadenopathy:    She has cervical adenopathy.  Neurological: She is alert and oriented to person, place, and time.  Skin: Skin is warm and dry. No rash noted. She is not diaphoretic. No erythema. No pallor.  Psychiatric: She has a normal mood and affect. Her behavior is normal. Judgment and thought content normal.  Nursing note and vitals reviewed.     Assessment & Plan:  1. Swollen lymph nodes - Likely from viral or bacterial sinus infection. Unlikely lymphoma or leukemia - doxycycline (VIBRAMYCIN) 100 MG capsule; Take 1 capsule (100 mg total) by mouth 2 (two) times daily.  Dispense: 14 capsule; Refill: 0 - Follow up if no improvement in 2-3 days - Consider biopsy.  - She left before being able to test for mono.

## 2015-07-06 NOTE — Progress Notes (Signed)
Pre visit review using our clinic review tool, if applicable. No additional management support is needed unless otherwise documented below in the visit note. 

## 2015-07-06 NOTE — Patient Instructions (Signed)
It was great meeting you today.   From your symptoms it sounds like you may have a sinus infection, this could be the reason that your lymph nodes are tender.   We can treat with antibiotics. I have sent in Doxycycline, please take this twice a day for 7 days.   Follow up with PCP if no improvement or if symptoms get worse.

## 2015-07-28 ENCOUNTER — Ambulatory Visit: Payer: BC Managed Care – PPO | Admitting: Internal Medicine

## 2015-08-15 LAB — HM DIABETES EYE EXAM

## 2015-08-27 ENCOUNTER — Other Ambulatory Visit: Payer: Self-pay | Admitting: Internal Medicine

## 2015-09-19 ENCOUNTER — Encounter: Payer: Self-pay | Admitting: Internal Medicine

## 2015-09-19 ENCOUNTER — Other Ambulatory Visit (INDEPENDENT_AMBULATORY_CARE_PROVIDER_SITE_OTHER): Payer: BC Managed Care – PPO

## 2015-09-19 ENCOUNTER — Ambulatory Visit (INDEPENDENT_AMBULATORY_CARE_PROVIDER_SITE_OTHER): Payer: BC Managed Care – PPO | Admitting: Internal Medicine

## 2015-09-19 VITALS — BP 108/78 | HR 92 | Temp 98.1°F | Ht 60.0 in | Wt 280.5 lb

## 2015-09-19 DIAGNOSIS — Z794 Long term (current) use of insulin: Secondary | ICD-10-CM | POA: Diagnosis not present

## 2015-09-19 DIAGNOSIS — E785 Hyperlipidemia, unspecified: Secondary | ICD-10-CM | POA: Diagnosis not present

## 2015-09-19 DIAGNOSIS — Z0000 Encounter for general adult medical examination without abnormal findings: Secondary | ICD-10-CM

## 2015-09-19 DIAGNOSIS — I10 Essential (primary) hypertension: Secondary | ICD-10-CM | POA: Diagnosis not present

## 2015-09-19 DIAGNOSIS — J8410 Pulmonary fibrosis, unspecified: Secondary | ICD-10-CM

## 2015-09-19 DIAGNOSIS — Z23 Encounter for immunization: Secondary | ICD-10-CM

## 2015-09-19 DIAGNOSIS — E1165 Type 2 diabetes mellitus with hyperglycemia: Secondary | ICD-10-CM

## 2015-09-19 LAB — MICROALBUMIN / CREATININE URINE RATIO
Creatinine,U: 197.8 mg/dL
MICROALB/CREAT RATIO: 0.7 mg/g (ref 0.0–30.0)
Microalb, Ur: 1.3 mg/dL (ref 0.0–1.9)

## 2015-09-19 LAB — CBC WITH DIFFERENTIAL/PLATELET
Basophils Absolute: 0.1 10*3/uL (ref 0.0–0.1)
Basophils Relative: 0.5 % (ref 0.0–3.0)
EOS ABS: 0 10*3/uL (ref 0.0–0.7)
EOS PCT: 0.3 % (ref 0.0–5.0)
HEMATOCRIT: 41.3 % (ref 36.0–46.0)
HEMOGLOBIN: 13.1 g/dL (ref 12.0–15.0)
LYMPHS PCT: 18.6 % (ref 12.0–46.0)
Lymphs Abs: 1.9 10*3/uL (ref 0.7–4.0)
MCHC: 31.8 g/dL (ref 30.0–36.0)
MCV: 91.1 fl (ref 78.0–100.0)
MONO ABS: 0.6 10*3/uL (ref 0.1–1.0)
Monocytes Relative: 5.8 % (ref 3.0–12.0)
Neutro Abs: 7.5 10*3/uL (ref 1.4–7.7)
Neutrophils Relative %: 74.8 % (ref 43.0–77.0)
Platelets: 243 10*3/uL (ref 150.0–400.0)
RBC: 4.54 Mil/uL (ref 3.87–5.11)
RDW: 14.1 % (ref 11.5–15.5)
WBC: 10.1 10*3/uL (ref 4.0–10.5)

## 2015-09-19 LAB — HEPATIC FUNCTION PANEL
ALK PHOS: 76 U/L (ref 39–117)
ALT: 16 U/L (ref 0–35)
AST: 15 U/L (ref 0–37)
Albumin: 3.8 g/dL (ref 3.5–5.2)
BILIRUBIN DIRECT: 0.1 mg/dL (ref 0.0–0.3)
BILIRUBIN TOTAL: 0.4 mg/dL (ref 0.2–1.2)
Total Protein: 7.5 g/dL (ref 6.0–8.3)

## 2015-09-19 LAB — BASIC METABOLIC PANEL
BUN: 15 mg/dL (ref 6–23)
CHLORIDE: 99 meq/L (ref 96–112)
CO2: 28 mEq/L (ref 19–32)
CREATININE: 0.63 mg/dL (ref 0.40–1.20)
Calcium: 9.5 mg/dL (ref 8.4–10.5)
GFR: 132.34 mL/min (ref >60.00)
Glucose, Bld: 186 mg/dL — ABNORMAL HIGH (ref 70–99)
POTASSIUM: 3.9 meq/L (ref 3.5–5.1)
Sodium: 136 mEq/L (ref 135–145)

## 2015-09-19 LAB — LIPID PANEL
CHOL/HDL RATIO: 4
CHOLESTEROL: 160 mg/dL (ref 0–200)
HDL: 38.7 mg/dL — ABNORMAL LOW (ref >39.00)
LDL CALC: 104 mg/dL — AB (ref 0–99)
NonHDL: 121.49
Triglycerides: 89 mg/dL (ref 0.0–149.0)
VLDL: 17.8 mg/dL (ref 0.0–40.0)

## 2015-09-19 LAB — HEMOGLOBIN A1C: Hgb A1c MFr Bld: 9.7 % — ABNORMAL HIGH (ref 4.6–6.5)

## 2015-09-19 LAB — TSH: TSH: 1.01 u[IU]/mL (ref 0.35–4.50)

## 2015-09-19 MED ORDER — HYDROCHLOROTHIAZIDE 12.5 MG PO CAPS: 12.5000 mg | ORAL_CAPSULE | Freq: Every day | ORAL | Status: DC | PRN

## 2015-09-19 MED ORDER — ATORVASTATIN CALCIUM 20 MG PO TABS: 20.0000 mg | ORAL_TABLET | Freq: Every day | ORAL | Status: DC

## 2015-09-19 MED ORDER — INSULIN DETEMIR 100 UNIT/ML FLEXPEN: 25.0000 [IU] | PEN_INJECTOR | Freq: Every day | SUBCUTANEOUS | Status: DC

## 2015-09-19 MED ORDER — LOSARTAN POTASSIUM 100 MG PO TABS: 100.0000 mg | ORAL_TABLET | Freq: Every day | ORAL | Status: DC

## 2015-09-19 MED ORDER — ASPIRIN EC 81 MG PO TBEC: 81.0000 mg | DELAYED_RELEASE_TABLET | Freq: Every day | ORAL | Status: DC

## 2015-09-19 MED ORDER — SITAGLIP PHOS-METFORMIN HCL ER 100-1000 MG PO TB24: 1.0000 | ORAL_TABLET | Freq: Every day | ORAL | Status: DC

## 2015-09-19 MED ORDER — OXYBUTYNIN CHLORIDE ER 10 MG PO TB24: 10.0000 mg | ORAL_TABLET | Freq: Every day | ORAL | Status: DC
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
## 2015-09-19 NOTE — Assessment & Plan Note (Signed)
New dx 11/2012 - a1c 10.0 Started Janumet for same -Changed to XR 03/2013-  Added Levemir qhs 03/2014 - increase dose now given hx of cbgs >250 cbgs freq uncontrolled, esp with variable pred dosing related to lung dz continue ARB and statin follow up a1c q 3 months with new PCP, sooner if problems The patient is asked to make an attempt to improve diet and exercise patterns to aid in medical management of this problem.  Lab Results  Component Value Date   HGBA1C 10.3* 08/19/2014

## 2015-09-19 NOTE — Assessment & Plan Note (Signed)
On statin - Recheck q6-12 mo, titrate as needed for LDL <100 

## 2015-09-19 NOTE — Assessment & Plan Note (Signed)
BP Readings from Last 3 Encounters:  09/19/15 108/78  07/06/15 110/80  03/09/15 112/86   Started ARB/hctz 02/29/12 - improved Cramping with diuretic so changed to ARB qd with hctz prn edema

## 2015-09-19 NOTE — Assessment & Plan Note (Signed)
Wt Readings from Last 3 Encounters:  09/19/15 280 lb 8 oz (127.234 kg)  07/06/15 283 lb 11.2 oz (128.685 kg)  03/09/15 283 lb (128.368 kg)   The patient is asked to make an attempt to improve diet and exercise patterns to aid in medical management of this problem.

## 2015-09-19 NOTE — Assessment & Plan Note (Signed)
On O2 related to interstitial lung dz and OHS  Historically variable dose of pred as per pulm - but plateaued dose at 10mg  qd since 2015 (unable to wean down further on multiple tries) Last flare 06/2014 reviewed - now back to baseline

## 2015-09-19 NOTE — Patient Instructions (Addendum)
It was good to see you today.  We have reviewed your prior records including labs and tests today  Your annual flu shot and Prevnar 13 was given and/or updated today.  Other Health Maintenance reviewed - all recommended immunizations and age-appropriate screenings are up-to-date.  Test(s) ordered today. Your results will be released to [facility] (or called to you) after review, usually within 72hours after test completion. If any changes need to be made, you will be notified at that same time.  Medications reviewed and updated Increase Levemir to 25 units at night every night. No other changes recommended at this time. Refill on medication(s) as discussed today.  We will schedule followup with your new PCP Dr. Quay Burow in 3 months for diabetes mellitus exam and labs, call sooner if problems.  Health Maintenance, Female Adopting a healthy lifestyle and getting preventive care can go a long way to promote health and wellness. Talk with your health care provider about what schedule of regular examinations is right for you. This is a good chance for you to check in with your provider about disease prevention and staying healthy. In between checkups, there are plenty of things you can do on your own. Experts have done a lot of research about which lifestyle changes and preventive measures are most likely to keep you healthy. Ask your health care provider for more information. WEIGHT AND DIET  Eat a healthy diet  Be sure to include plenty of vegetables, fruits, low-fat dairy products, and lean protein.  Do not eat a lot of foods high in solid fats, added sugars, or salt.  Get regular exercise. This is one of the most important things you can do for your health.  Most adults should exercise for at least 150 minutes each week. The exercise should increase your heart rate and make you sweat (moderate-intensity exercise).  Most adults should also do strengthening exercises at least twice a week. This  is in addition to the moderate-intensity exercise.  Maintain a healthy weight  Body mass index (BMI) is a measurement that can be used to identify possible weight problems. It estimates body fat based on height and weight. Your health care provider can help determine your BMI and help you achieve or maintain a healthy weight.  For females 73 years of age and older:   A BMI below 18.5 is considered underweight.  A BMI of 18.5 to 24.9 is normal.  A BMI of 25 to 29.9 is considered overweight.  A BMI of 30 and above is considered obese.  Watch levels of cholesterol and blood lipids  You should start having your blood tested for lipids and cholesterol at 43 years of age, then have this test every 5 years.  You may need to have your cholesterol levels checked more often if:  Your lipid or cholesterol levels are high.  You are older than 43 years of age.  You are at high risk for heart disease.  CANCER SCREENING   Lung Cancer  Lung cancer screening is recommended for adults 31-42 years old who are at high risk for lung cancer because of a history of smoking.  A yearly low-dose CT scan of the lungs is recommended for people who:  Currently smoke.  Have quit within the past 15 years.  Have at least a 30-pack-year history of smoking. A pack year is smoking an average of one pack of cigarettes a day for 1 year.  Yearly screening should continue until it has been 15 years  since you quit.  Yearly screening should stop if you develop a health problem that would prevent you from having lung cancer treatment.  Breast Cancer  Practice breast self-awareness. This means understanding how your breasts normally appear and feel.  It also means doing regular breast self-exams. Let your health care provider know about any changes, no matter how small.  If you are in your 20s or 30s, you should have a clinical breast exam (CBE) by a health care provider every 1-3 years as part of a  regular health exam.  If you are 75 or older, have a CBE every year. Also consider having a breast X-ray (mammogram) every year.  If you have a family history of breast cancer, talk to your health care provider about genetic screening.  If you are at high risk for breast cancer, talk to your health care provider about having an MRI and a mammogram every year.  Breast cancer gene (BRCA) assessment is recommended for women who have family members with BRCA-related cancers. BRCA-related cancers include:  Breast.  Ovarian.  Tubal.  Peritoneal cancers.  Results of the assessment will determine the need for genetic counseling and BRCA1 and BRCA2 testing. Cervical Cancer Your health care provider may recommend that you be screened regularly for cancer of the pelvic organs (ovaries, uterus, and vagina). This screening involves a pelvic examination, including checking for microscopic changes to the surface of your cervix (Pap test). You may be encouraged to have this screening done every 3 years, beginning at age 69.  For women ages 25-65, health care providers may recommend pelvic exams and Pap testing every 3 years, or they may recommend the Pap and pelvic exam, combined with testing for human papilloma virus (HPV), every 5 years. Some types of HPV increase your risk of cervical cancer. Testing for HPV may also be done on women of any age with unclear Pap test results.  Other health care providers may not recommend any screening for nonpregnant women who are considered low risk for pelvic cancer and who do not have symptoms. Ask your health care provider if a screening pelvic exam is right for you.  If you have had past treatment for cervical cancer or a condition that could lead to cancer, you need Pap tests and screening for cancer for at least 20 years after your treatment. If Pap tests have been discontinued, your risk factors (such as having a new sexual partner) need to be reassessed to  determine if screening should resume. Some women have medical problems that increase the chance of getting cervical cancer. In these cases, your health care provider may recommend more frequent screening and Pap tests. Colorectal Cancer  This type of cancer can be detected and often prevented.  Routine colorectal cancer screening usually begins at 43 years of age and continues through 43 years of age.  Your health care provider may recommend screening at an earlier age if you have risk factors for colon cancer.  Your health care provider may also recommend using home test kits to check for hidden blood in the stool.  A small camera at the end of a tube can be used to examine your colon directly (sigmoidoscopy or colonoscopy). This is done to check for the earliest forms of colorectal cancer.  Routine screening usually begins at age 25.  Direct examination of the colon should be repeated every 5-10 years through 43 years of age. However, you may need to be screened more often if early forms  of precancerous polyps or small growths are found. Skin Cancer  Check your skin from head to toe regularly.  Tell your health care provider about any new moles or changes in moles, especially if there is a change in a mole's shape or color.  Also tell your health care provider if you have a mole that is larger than the size of a pencil eraser.  Always use sunscreen. Apply sunscreen liberally and repeatedly throughout the day.  Protect yourself by wearing long sleeves, pants, a wide-brimmed hat, and sunglasses whenever you are outside. HEART DISEASE, DIABETES, AND HIGH BLOOD PRESSURE   High blood pressure causes heart disease and increases the risk of stroke. High blood pressure is more likely to develop in:  People who have blood pressure in the high end of the normal range (130-139/85-89 mm Hg).  People who are overweight or obese.  People who are African American.  If you are 18-39 years of  age, have your blood pressure checked every 3-5 years. If you are 27 years of age or older, have your blood pressure checked every year. You should have your blood pressure measured twice--once when you are at a hospital or clinic, and once when you are not at a hospital or clinic. Record the average of the two measurements. To check your blood pressure when you are not at a hospital or clinic, you can use:  An automated blood pressure machine at a pharmacy.  A home blood pressure monitor.  If you are between 76 years and 45 years old, ask your health care provider if you should take aspirin to prevent strokes.  Have regular diabetes screenings. This involves taking a blood sample to check your fasting blood sugar level.  If you are at a normal weight and have a low risk for diabetes, have this test once every three years after 43 years of age.  If you are overweight and have a high risk for diabetes, consider being tested at a younger age or more often. PREVENTING INFECTION  Hepatitis B  If you have a higher risk for hepatitis B, you should be screened for this virus. You are considered at high risk for hepatitis B if:  You were born in a country where hepatitis B is common. Ask your health care provider which countries are considered high risk.  Your parents were born in a high-risk country, and you have not been immunized against hepatitis B (hepatitis B vaccine).  You have HIV or AIDS.  You use needles to inject street drugs.  You live with someone who has hepatitis B.  You have had sex with someone who has hepatitis B.  You get hemodialysis treatment.  You take certain medicines for conditions, including cancer, organ transplantation, and autoimmune conditions. Hepatitis C  Blood testing is recommended for:  Everyone born from [birth year] through 1964.  Anyone with known risk factors for hepatitis C. Sexually transmitted infections (STIs)  You should be screened for sexually  transmitted infections (STIs) including gonorrhea and chlamydia if:  You are sexually active and are younger than 43 years of age.  You are older than 43 years of age and your health care provider tells you that you are at risk for this type of infection.  Your sexual activity has changed since you were last screened and you are at an increased risk for chlamydia or gonorrhea. Ask your health care provider if you are at risk.  If you do not have HIV, but are at  risk, it may be recommended that you take a prescription medicine daily to prevent HIV infection. This is called pre-exposure prophylaxis (PrEP). You are considered at risk if:  You are sexually active and do not regularly use condoms or know the HIV status of your partner(s).  You take drugs by injection.  You are sexually active with a partner who has HIV. Talk with your health care provider about whether you are at high risk of being infected with HIV. If you choose to begin PrEP, you should first be tested for HIV. You should then be tested every 3 months for as long as you are taking PrEP.  PREGNANCY   If you are premenopausal and you may become pregnant, ask your health care provider about preconception counseling.  If you may become pregnant, take 400 to 800 micrograms (mcg) of folic acid every day.  If you want to prevent pregnancy, talk to your health care provider about birth control (contraception). OSTEOPOROSIS AND MENOPAUSE   Osteoporosis is a disease in which the bones lose minerals and strength with aging. This can result in serious bone fractures. Your risk for osteoporosis can be identified using a bone density scan.  If you are 74 years of age or older, or if you are at risk for osteoporosis and fractures, ask your health care provider if you should be screened.  Ask your health care provider whether you should take a calcium or vitamin D supplement to lower your risk for osteoporosis.  Menopause may have  certain physical symptoms and risks.  Hormone replacement therapy may reduce some of these symptoms and risks. Talk to your health care provider about whether hormone replacement therapy is right for you.  HOME CARE INSTRUCTIONS   Schedule regular health, dental, and eye exams.  Stay current with your immunizations.   Do not use any tobacco products including cigarettes, chewing tobacco, or electronic cigarettes.  If you are pregnant, do not drink alcohol.  If you are breastfeeding, limit how much and how often you drink alcohol.  Limit alcohol intake to no more than 1 drink per day for nonpregnant women. One drink equals 12 ounces of beer, 5 ounces of wine, or 1 ounces of hard liquor.  Do not use street drugs.  Do not share needles.  Ask your health care provider for help if you need support or information about quitting drugs.  Tell your health care provider if you often feel depressed.  Tell your health care provider if you have ever been abused or do not feel safe at home.   This information is not intended to replace advice given to you by your health care provider. Make sure you discuss any questions you have with your health care provider.   Document Released: 04/02/2011 Document Revised: 10/08/2014 Document Reviewed: 08/19/2013 Elsevier Interactive Patient Education 2016 [place]. Diabetes and Standards of Medical Care Diabetes is complicated. You may find that your diabetes team includes a dietitian, nurse, diabetes educator, eye doctor, and more. To help everyone know what is going on and to help you get the care you deserve, the following schedule of care was developed to help keep you on track. Below are the tests, exams, vaccines, medicines, education, and plans you will need. HbA1c test This test shows how well you have controlled your glucose over the past 2-3 months. It is used to see if your diabetes management plan needs to be adjusted.   It is performed  at least 2 times a  year if you are meeting treatment goals.  It is performed 4 times a year if therapy has changed or if you are not meeting treatment goals. Blood pressure test  This test is performed at every routine medical visit. The goal is less than 140/90 mm Hg for most people, but 130/80 mm Hg in some cases. Ask your health care provider about your goal. Dental exam  Follow up with the dentist regularly. Eye exam  If you are diagnosed with type 1 diabetes as a child, get an exam upon reaching the age of 48 years or older and having had diabetes for 3-5 years. Yearly eye exams are recommended after that initial eye exam.  If you are diagnosed with type 1 diabetes as an adult, get an exam within 5 years of diagnosis and then yearly.  If you are diagnosed with type 2 diabetes, get an exam as soon as possible after the diagnosis and then yearly. Foot care exam  Visual foot exams are performed at every routine medical visit. The exams check for cuts, injuries, or other problems with the feet.  You should have a complete foot exam performed every year. This exam includes an inspection of the structure and skin of your feet, a check of the pulses in your feet, and a check of the sensation in your feet.  Type 1 diabetes: The first exam is performed 5 years after diagnosis.  Type 2 diabetes: The first exam is performed at the time of diagnosis.  Check your feet nightly for cuts, injuries, or other problems with your feet. Tell your health care provider if anything is not healing. Kidney function test (urine microalbumin)  This test is performed once a year.  Type 1 diabetes: The first test is performed 5 years after diagnosis.  Type 2 diabetes: The first test is performed at the time of diagnosis.  A serum creatinine and estimated glomerular filtration rate (eGFR) test is done once a year to assess the level of chronic kidney disease (CKD), if present. Lipid profile (cholesterol,  HDL, LDL, triglycerides)  Performed every 5 years for most people.  The goal for LDL is less than 100 mg/dL. If you are at high risk, the goal is less than 70 mg/dL.  The goal for HDL is 40 mg/dL-50 mg/dL for men and 50 mg/dL-60 mg/dL for women. An HDL cholesterol of 60 mg/dL or higher gives some protection against heart disease.  The goal for triglycerides is less than 150 mg/dL. Immunizations  The flu (influenza) vaccine is recommended yearly for every person 40 months of age or older who has diabetes.  The pneumonia (pneumococcal) vaccine is recommended for every person 56 years of age or older who has diabetes. Adults 71 years of age or older may receive the pneumonia vaccine as a series of two separate shots.  The hepatitis B vaccine is recommended for adults shortly after they have been diagnosed with diabetes.  The Tdap (tetanus, diphtheria, and pertussis) vaccine should be given:  According to normal childhood vaccination schedules, for children.  Every 10 years, for adults who have diabetes. Diabetes self-management education  Education is recommended at diagnosis and ongoing as needed. Treatment plan  Your treatment plan is reviewed at every medical visit.   This information is not intended to replace advice given to you by your health care provider. Make sure you discuss any questions you have with your health care provider.   Document Released: 07/15/2009 Document Revised: 10/08/2014 Document Reviewed: 02/17/2013 Elsevier  Interactive Patient Education [employer].

## 2015-09-19 NOTE — Progress Notes (Signed)
Pre visit review using our clinic review tool, if applicable. No additional management support is needed unless otherwise documented below in the visit note. 

## 2015-10-12 ENCOUNTER — Other Ambulatory Visit: Payer: Self-pay

## 2015-10-12 MED ORDER — ESOMEPRAZOLE MAGNESIUM 20 MG PO CPDR: DELAYED_RELEASE_CAPSULE | ORAL | Status: DC

## 2015-10-13 ENCOUNTER — Telehealth: Payer: Self-pay | Admitting: Internal Medicine

## 2015-10-13 MED ORDER — ESOMEPRAZOLE MAGNESIUM 20 MG PO CPDR: DELAYED_RELEASE_CAPSULE | ORAL | Status: DC

## 2015-10-13 NOTE — Telephone Encounter (Signed)
Yes fine 40 mg Take 30-60 min before first meal of the day

## 2015-10-13 NOTE — Telephone Encounter (Signed)
Pt's insurance will not cover Nexium 20mg  (OTC). They will cover Nexium 40mg .  MW - can we change?

## 2015-10-13 NOTE — Telephone Encounter (Signed)
Spoke with pt, aware of rx change.  rx sent to preferred pharmacy.  Nothing further needed.  

## 2015-10-18 ENCOUNTER — Other Ambulatory Visit: Payer: Self-pay

## 2015-10-18 DIAGNOSIS — Z1231 Encounter for screening mammogram for malignant neoplasm of breast: Secondary | ICD-10-CM

## 2015-10-21 ENCOUNTER — Ambulatory Visit (INDEPENDENT_AMBULATORY_CARE_PROVIDER_SITE_OTHER): Payer: BC Managed Care – PPO | Admitting: Internal Medicine

## 2015-10-21 ENCOUNTER — Encounter: Payer: Self-pay | Admitting: Internal Medicine

## 2015-10-21 VITALS — BP 142/90 | HR 76 | Ht 60.0 in | Wt 283.0 lb

## 2015-10-21 DIAGNOSIS — J8410 Pulmonary fibrosis, unspecified: Secondary | ICD-10-CM | POA: Diagnosis not present

## 2015-10-21 DIAGNOSIS — J9611 Chronic respiratory failure with hypoxia: Secondary | ICD-10-CM

## 2015-10-21 NOTE — Patient Instructions (Signed)
Please see patient coordinator before you leave today  to schedule BEST FIT eval for portable 02   Keep working on diet / exercise   Please schedule a follow up visit in 3 months but call sooner if needed

## 2015-10-21 NOTE — Progress Notes (Signed)
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  Since then waxing/waning sx requiring intermittent prednisone tapers with only minimal improvement - most of her symptoms chronically have been related to obesity with dyspnea on exertion and tendency to chronic cough which is felt to be at least partly  reflux related but improved with saba 03/09/2015 so started on dulera 100 2bid in addition to maint pred and seems to have helped but never able to get < 10 mg pred s flare cough/sob     History of Present Illness  November 07, 2010 ov cc cough/ strangling  a bit more with taper off reglan (actually worse before ran out of the low dose reglan)  no excess mucus or increase sob on prednisone @  10 mg daily .  Using med calendar well rec 1)  Ok to resume water aerobics but pace yourself 2)  Wait until your swallowing evaulation is complete before making a decision re reglan > stopped it on her own.  3)  Try Prednisone 10 mg one-half each am   04/04/2012 f/u ov/Lindi Abram no longer using med calendar on Pred 10 mg one daily  added hyzaar to rx for hbp/leg swelling not back to mall walking yet,  No unusual cough, purulent sputum or sinus/hb symptoms on present rx. No variability to symptoms or need for inhaler. Rec Try prednisone 10 mg one half daily to see if affects your ability to walk the mall on 02 or the cough gets worse (walk the mall first for at least before you try) Always walk for exercise on 3lpm - this will help you burn fat and get into negative calorie balance to lose weight.   03/17/2013 f/u ov/Tison Leibold re NSIP on floor of 10 mg per day Chief Complaint  Patient presents with  . Follow-up    Breathing is overall doing well and she denies any new co's today.   ex on a treadmill x 53m x 3 x daily on 2lpm not the 3lpm as rec >>Prednisone ceiling  is 20 mg per day and floor is 10 mg per day        10/06/2014 f/u ov/Charliene Inoue re: pred 10 mg daily after trying 10/5 until Tgiving and worse cough/ sob  Chief Complaint  Patient presents with  . Follow-up    F/U ILD; breathing doing fine; no complaints  Not doing treadmill  Any more but Not limited by breathing from desired activities   rec Resume treadmill x 30 min daily minimum If  doing great try to reduce prednisone to 10 mg alternating with 5 mg daily as your new floor but increase back to ceiling of 20 mg per day if needed     03/09/2015 f/u ov/Petar Mucci re: NSIP  pred 10 mg daily could not tol 10/5  Chief Complaint  Patient presents with  . Follow-up    PFT done today. Pt states her breathing is overall doing well, unless she gets exposed to hot/humid weather. She is taking 10 mg pred daily.   2lpm sleep/ 3 lpm ex , flare of cough with < 10 mg per day pred, better cough p albuterol  rec Work on inhaler technique:    Try dulera 100 Take 2 puffs first thing in am and then another 2  puffs about 12 hours later> helped some     10/21/2015  f/u ov/Aasha Dina re: NSIP/ 02 at hs and with ex pred at 10 mg daily  Chief Complaint  Patient presents with  . Follow-up    Pt states that her breathing is doing well. She denies any co's today.    lots of stress at home related to husband's accident and not following diet/ ex plans but trying to get back into it and requesting poc.   No obvious day to day or daytime variabilty or assoc  cp or chest tightness, subjective wheeze overt sinus or hb symptoms. No unusual exp hx or h/o childhood pna/ asthma or knowledge of premature birth.  Sleeping ok on 02 2lpm without nocturnal  or early am exacerbation  of respiratory  c/o's or need for noct saba. Also denies any obvious fluctuation of symptoms with weather or environmental changes or other aggravating or alleviating factors except as outlined above   Current Medications, Allergies, Complete Past Medical  History, Past Surgical History, Family History, and Social History were reviewed in [employer] record.  ROS  The following are not active complaints unless bolded sore throat, dysphagia, dental problems, itching, sneezing,  nasal congestion or excess/ purulent secretions, ear ache,   fever, chills, sweats, unintended wt loss, pleuritic or exertional cp, hemoptysis,  orthopnea pnd or leg swelling, presyncope, palpitations, heartburn, abdominal pain, anorexia, nausea, vomiting, diarrhea  or change in bowel or urinary habits, change in stools or urine, dysuria,hematuria,  rash, arthralgias, visual complaints, headache, numbness weakness or ataxia or problems with walking or coordination,  change in mood/affect or memory.             Past Medical History: GASTROESOPHAGEAL REFLUX DISEASE     - Tapered reglan to 10 mg one half twice daily June 22, 2010 > d/c'd 11/2010  -restarted reglan 03/2011 >>unable to tolerate.  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)   -Restart Prednisone 02/24/08   -PFT's December 23, 2008 VC 35%  DLC0 31%   -Desats with > 2 laps 06/22/10 so rec wear 02 2lpm at bedtime and with ex  MORBID OBESITY   - Target wt  =  153  for BMI < 30  Hypertension Health Maintenance..........................................................Marland KitchenMarland KitchenLeschber    - Pneumovax July 22, 2009      - Flu 06/21/2011  Complex med regimen 06/21/2011 , 06/15/2013         Objective:   Physical Exam  Massively obese pleasant amb bf nad / vital signs reviewed/ note bp high/ sats ok on RA p arrival off 02   wt  305 May 28, 2008 >   302 November 07, 2010 >   303 03/22/2011 > 10/25/2011  299 > 311 04/04/2012 > 306 03/17/2013 >06/15/2013 303 > 10/06/2013 298  > 02/08/2014 289 > 10/06/2014   288 > 03/09/2015 283 > 10/21/2015    283   HEENT: nl dentition, turbinates, and orophanx. Nl external ear canals  without cough reflex Neck without JVD/Nodes/TM Lungs distant bs,  Distant BS w/ no wheezing / no cough on insp RRR no s3 or murmur or increase in P2. no edema Abd soft and benign with nl excursion in the supine position. Ext warm without calf tenderness, cyanosis clubbing        Assessment & Plan:

## 2015-10-22 NOTE — Assessment & Plan Note (Signed)
Body mass index is 55.27 kg/(m^2).  Lab Results  Component Value Date   TSH 1.01 09/19/2015     Contributing to gerd tendency/ doe/reviewed the need and the process to achieve and maintain neg calorie balance > defer f/u primary care including intermittently monitoring thyroid status

## 2015-10-22 NOTE — Assessment & Plan Note (Signed)
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  For now therefore I recommend the patient maintain  10 mg daily /work on wt

## 2015-10-22 NOTE — Assessment & Plan Note (Signed)
-   Started on 02 06/22/10   - 04/05/2012  Walked 2lpm x 3 laps @ 185 ft each stopped due to  desat corrected on 3lpm so rec 3lpm with exertion  - 10/06/2013  Walked RA x 2 laps @ 185 ft each stopped due to  Cough and desat to 86% at more rapid pace than usual and on 3lpm could do 3 laps s difficulty     rx  As of 10/21/2015 = no 02 at rest, 3lpm with exertion, 2lpm at hs > referred for BEST FIT

## 2015-10-26 ENCOUNTER — Ambulatory Visit
Admission: RE | Admit: 2015-10-26 | Discharge: 2015-10-26 | Disposition: A | Discharge status: Home / Self Care | Payer: BC Managed Care – PPO | Source: Ambulatory Visit

## 2015-10-26 DIAGNOSIS — Z1231 Encounter for screening mammogram for malignant neoplasm of breast: Secondary | ICD-10-CM

## 2015-11-29 ENCOUNTER — Other Ambulatory Visit: Payer: Self-pay | Admitting: Internal Medicine

## 2015-12-10 ENCOUNTER — Other Ambulatory Visit: Payer: Self-pay | Admitting: Internal Medicine

## 2015-12-20 ENCOUNTER — Ambulatory Visit: Payer: BC Managed Care – PPO | Admitting: Internal Medicine

## 2016-01-19 ENCOUNTER — Ambulatory Visit: Payer: BC Managed Care – PPO | Admitting: Internal Medicine

## 2016-02-02 ENCOUNTER — Ambulatory Visit (INDEPENDENT_AMBULATORY_CARE_PROVIDER_SITE_OTHER): Payer: BC Managed Care – PPO | Admitting: Internal Medicine

## 2016-02-02 ENCOUNTER — Encounter: Payer: Self-pay | Admitting: Internal Medicine

## 2016-02-02 VITALS — BP 106/82 | HR 95 | Temp 98.6°F | Resp 18 | Ht 60.0 in | Wt 281.0 lb

## 2016-02-02 DIAGNOSIS — E785 Hyperlipidemia, unspecified: Secondary | ICD-10-CM

## 2016-02-02 DIAGNOSIS — I10 Essential (primary) hypertension: Secondary | ICD-10-CM | POA: Diagnosis not present

## 2016-02-02 DIAGNOSIS — K219 Gastro-esophageal reflux disease without esophagitis: Secondary | ICD-10-CM

## 2016-02-02 DIAGNOSIS — J8410 Pulmonary fibrosis, unspecified: Secondary | ICD-10-CM

## 2016-02-02 DIAGNOSIS — M25562 Pain in left knee: Secondary | ICD-10-CM | POA: Insufficient documentation

## 2016-02-02 DIAGNOSIS — E1165 Type 2 diabetes mellitus with hyperglycemia: Secondary | ICD-10-CM | POA: Diagnosis not present

## 2016-02-02 DIAGNOSIS — J302 Other seasonal allergic rhinitis: Secondary | ICD-10-CM

## 2016-02-02 DIAGNOSIS — Z794 Long term (current) use of insulin: Secondary | ICD-10-CM

## 2016-02-02 MED ORDER — INSULIN DETEMIR 100 UNIT/ML FLEXPEN: 25.0000 [IU] | PEN_INJECTOR | Freq: Every day | SUBCUTANEOUS | Status: DC

## 2016-02-02 MED ORDER — MONTELUKAST SODIUM 10 MG PO TABS: 10.0000 mg | ORAL_TABLET | Freq: Every day | ORAL | Status: DC

## 2016-02-02 NOTE — Assessment & Plan Note (Signed)
Has some known arthritis, but this will ensure that is causing the pain Will refer to Dr. Tamala Julian

## 2016-02-02 NOTE — Assessment & Plan Note (Signed)
Seasonal Allergies symptoms not currently controlled She did switch from Claritin to Zyrtec, which may help She is using Flonase and allergy eyedrops We will add Singulair

## 2016-02-02 NOTE — Assessment & Plan Note (Signed)
Blood pressure controlled-actually in the low side She will start monitoring more closely at home Continue current dose of medication for now

## 2016-02-02 NOTE — Patient Instructions (Addendum)
    Medications reviewed and updated.  Changes include starting singular for your allergies.  You should also switch your insulin to the morning.     Your prescription(s) have been submitted to your pharmacy. Please take as directed and contact our office if you believe you are having problem(s) with the medication(s).  Please followup in 3 motnhs

## 2016-02-02 NOTE — Assessment & Plan Note (Signed)
Taking atorvastatin 20 mg daily Continue above

## 2016-02-02 NOTE — Progress Notes (Signed)
Pre visit review using our clinic review tool, if applicable. No additional management support is needed unless otherwise documented below in the visit note. 

## 2016-02-02 NOTE — Progress Notes (Signed)
Subjective:    Patient ID: [name], female    DOB: 06-18-1971, 45 y.o.   MRN: [id number]  HPI She is here to establish with a new pcp.  She is here for follow up.  Allergies:  She is taking claritin, flonase and allergy eye drops and is still having bad allergy symptoms.  She can not do neti pot.  She uses a saline nasal spray.  This year has been bad.  This week she was planning on switching to zyrtec.    Left knee pain;  She has see ortho and had xrays done. She does have some arthritis in her knee and top of feet.  4 the past two weeks her knee aches and wakes her up.  She took an anti-inflammatory (husband's prescription) and it helped.  She denies swelling.  She is taking 10 mg prednisone daily for her lung disease. She wonders what she can take to help the pain.  Hyperlipidemia: She is taking her medication daily. She is compliant with a low fat/cholesterol diet. She is exercising regularly. She denies myalgias.   Hypertension: She is taking her medication daily. She is compliant with a low sodium diet.  She denies chest pain, palpitations, edema, shortness of breath and regular headaches. She is exercising regularly.  She does not monitor her blood pressure at home.    Diabetes: She is taking her janumet daily as prescribed, but sometimes forgets to take her insulin. She takes her insulin at night and it is the only medication she takes at night. She is compliant with a diabetic diet. She is exercising regularly. . She checks her feet daily and denies foot lesions. She is up-to-date with an ophthalmology examination.     GERD:  She is taking her medication daily as prescribed.  She denies any GERD symptoms and feels her GERD is well controlled.   Interstitial lung disease, asthma:  She is following with pulmonary.  She is taking 10 mg prednisone daily.  She is on oxygen at night and when she exercises.  She uses it rarely during the day.  She denies any chronic shortness of  breath, wheezing or cough. She currently has a cough only from allergies. She is concerned that her allergies do not become better controlled and will flare her lung disease.  Medications and allergies reviewed with patient and updated if appropriate.  Patient Active Problem List   Diagnosis Date Noted  . Cough variant asthma 03/09/2015  . Diabetes type 2, uncontrolled ([place]) 12/09/2012  . Dyslipidemia   . Chronic respiratory failure with hypoxia ([place]) 04/05/2012  . CONJUNCTIVITIS, ALLERGIC, SEASONAL 01/05/2010  . ALLERGIC RHINITIS 01/05/2010  . Essential hypertension 09/07/2009  . MUSCLE SPASM 09/07/2009  . DEPRESSION, MILD 03/24/2009  . HEADACHE 03/24/2009  . URINARY INCONTINENCE, URGE 03/24/2009  . CHRONIC RHINITIS 12/23/2008  . Severe obesity (BMI >= 40) ([place]) 10/10/2007  . INTERSTITIAL LUNG DISEASE 10/10/2007  . GASTROESOPHAGEAL REFLUX DISEASE 10/10/2007    Current Outpatient Prescriptions on File Prior to Visit  Medication Sig Dispense Refill  . aspirin EC 81 MG tablet Take 1 tablet (81 mg total) by mouth daily. 150 tablet 2  . atorvastatin (LIPITOR) 20 MG tablet TAKE 1 TABLET (20 MG TOTAL) BY MOUTH DAILY AT 6 PM. 90 tablet 2  . B-D ULTRAFINE III SHORT PEN 31G X 8 MM MISC USE NEEDLE AS NEEDED TO INJECTED INSULIN AS PRESCRIBED BY PHYSICIAN 100 each 2  . Blood Glucose Monitoring Suppl (ONE TOUCH  ULTRA MINI) W/DEVICE KIT Use as directed to check blood sugar twice a day. Dx 250.00 1 each 0  . esomeprazole (NEXIUM) 20 MG capsule Take 1 cap by mouth 30-60 minutes before first meal of the day 30 capsule 5  . hydrochlorothiazide (MICROZIDE) 12.5 MG capsule Take 1 capsule (12.5 mg total) by mouth daily as needed. 30 capsule 2  . Insulin Detemir (LEVEMIR FLEXPEN) 100 UNIT/ML Pen Inject 25 Units into the skin daily at 10 pm. 15 mL 11  . loratadine (CLARITIN) 10 MG tablet Take 10 mg by mouth daily as needed (nasal drainage, drip).    Marland Kitchen losartan (COZAAR) 100 MG tablet Take 1 tablet (100 mg  total) by mouth daily. 90 tablet 2  . mometasone-formoterol (DULERA) 100-5 MCG/ACT AERO Take 2 puffs first thing in am and then another 2 puffs about 12 hours later. 1 Inhaler 11  . ONE TOUCH LANCETS MISC Use as directed to help check blood sugars twice daily. Dx 250.00 60 each 5  . ONE TOUCH ULTRA TEST test strip USE TO CHECK BLOOD SUGARS TWICE DAILY 100 each 4  . oxybutynin (DITROPAN XL) 10 MG 24 hr tablet Take 1 tablet (10 mg total) by mouth at bedtime. 90 tablet 1  . OXYGEN-HELIUM IN Inhale 2-3 L into the lungs daily.     Marland Kitchen oxymetazoline (AFRIN) 0.05 % nasal spray 2 puffs twice daily as needed for 5 days (before Fluticasone)    . predniSONE (DELTASONE) 10 MG tablet TAKE 1 TABLET (10 MG TOTAL) BY MOUTH DAILY. 90 tablet 0  . SitaGLIPtin-MetFORMIN HCl (JANUMET XR) [phone] MG TB24 Take 1 tablet by mouth daily. 90 tablet 2  . [DISCONTINUED] DETROL LA 2 MG 24 hr capsule TAKE 1 CAPSULE BY MOUTH DAILY 30 capsule 10   No current facility-administered medications on file prior to visit.    Past Medical History  Diagnosis Date  . Dysphagia   . Allergic rhinitis   . Unspecified essential hypertension   . Mild depression   . Headache(784.0)   . Urge incontinence   . Chronic rhinitis   . GERD (gastroesophageal reflux disease)   . Cough   . Bronchitis   . ILD (interstitial lung disease) ([place])   . Morbid obesity ([place])   . Type II or diabetes mellitus, uncontrolled 11/2012 dx    Dx 12/08/12: a1c 10.0  . Dyslipidemia     Past Surgical History  Procedure Laterality Date  . Bilateral vats ablation  02/15/04    Social History   Social History  . Marital Status: Married    Spouse Name: N/A  . Number of Children: 1  . Years of Education: N/A   Occupational History  . 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 Smoking status: Never Smoker   . Smokeless tobacco: Never Used     Comment: {  . Alcohol Use: No  . Drug Use: No    . Sexual Activity: Not on file   Other Topics Concern  . 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 Sarcoidosis Mother     dxed by transbrochial bx  . Allergies Mother   . Heart disease Father   . Diabetes Father   . Allergies Father   .  Coronary artery disease Father   . Cancer Maternal Grandmother     CA of unknown type  . Cancer Paternal Grandmother     CA of unknown type    Review of Systems  Constitutional: Negative for fever.  HENT: Positive for congestion and postnasal drip.   Eyes: Positive for itching.  Respiratory: Positive for cough (with allergies). Negative for shortness of breath and wheezing.   Cardiovascular: Negative for chest pain and palpitations.  Neurological: Positive for light-headedness (occasionally with allergies) and headaches (occasionally with allergies). Negative for dizziness.       Objective:   Filed Vitals:   02/02/16 1102  BP: 106/82  Pulse: 95  Temp: 98.6 F (37 C)  Resp: 18   Filed Weights   02/02/16 1102  Weight: 281 lb (127.461 kg)   Body mass index is 54.88 kg/(m^2).   Physical Exam Constitutional: Appears well-developed and well-nourished. No distress.  Neck: Neck supple. No tracheal deviation present. No thyromegaly present.  No carotid bruit. No cervical adenopathy.   Cardiovascular: Normal rate, regular rhythm and normal heart sounds.   No murmur heard.  No edema Pulmonary/Chest: Effort normal and breath sounds normal. No respiratory distress. No wheezes.       Assessment & Plan:    Interstitial lung disease, asthma: Following with pulmonary Uses oxygen at night and with activity On prednisone 10 mg daily Using inhalers Management per pulmonary  See Problem List for Assessment and Plan of chronic medical problems.  Follow-up in 3 months-we will do blood work at  that time

## 2016-02-02 NOTE — Assessment & Plan Note (Signed)
Sugars have not been controlled, but she has not been taking her insulin daily-taking it only approximately 50% of the time Continue Januvia-metformin once daily Switch insulin tomorrow morning Follow-up in 3 months-we will check A1c at that time Continue regular exercise and weight loss efforts

## 2016-02-02 NOTE — Assessment & Plan Note (Signed)
GERD controlled Continue daily medication  

## 2016-02-05 ENCOUNTER — Encounter (HOSPITAL_COMMUNITY): Payer: Self-pay

## 2016-02-05 ENCOUNTER — Emergency Department (HOSPITAL_COMMUNITY)
Admission: EM | Admit: 2016-02-05 | Discharge: 2016-02-05 | Disposition: A | Discharge status: Home / Self Care | Payer: BC Managed Care – PPO | Attending: Emergency Medicine | Admitting: Emergency Medicine

## 2016-02-05 DIAGNOSIS — S86912A Strain of unspecified muscle(s) and tendon(s) at lower leg level, left leg, initial encounter: Secondary | ICD-10-CM | POA: Insufficient documentation

## 2016-02-05 DIAGNOSIS — F329 Major depressive disorder, single episode, unspecified: Secondary | ICD-10-CM | POA: Insufficient documentation

## 2016-02-05 DIAGNOSIS — Y939 Activity, unspecified: Secondary | ICD-10-CM | POA: Diagnosis not present

## 2016-02-05 DIAGNOSIS — Y999 Unspecified external cause status: Secondary | ICD-10-CM | POA: Insufficient documentation

## 2016-02-05 DIAGNOSIS — Z794 Long term (current) use of insulin: Secondary | ICD-10-CM | POA: Insufficient documentation

## 2016-02-05 DIAGNOSIS — I10 Essential (primary) hypertension: Secondary | ICD-10-CM | POA: Diagnosis not present

## 2016-02-05 DIAGNOSIS — M179 Osteoarthritis of knee, unspecified: Secondary | ICD-10-CM | POA: Diagnosis not present

## 2016-02-05 DIAGNOSIS — K219 Gastro-esophageal reflux disease without esophagitis: Secondary | ICD-10-CM | POA: Diagnosis not present

## 2016-02-05 DIAGNOSIS — M25562 Pain in left knee: Secondary | ICD-10-CM

## 2016-02-05 DIAGNOSIS — Y929 Unspecified place or not applicable: Secondary | ICD-10-CM | POA: Insufficient documentation

## 2016-02-05 DIAGNOSIS — Z7984 Long term (current) use of oral hypoglycemic drugs: Secondary | ICD-10-CM | POA: Diagnosis not present

## 2016-02-05 DIAGNOSIS — E1165 Type 2 diabetes mellitus with hyperglycemia: Secondary | ICD-10-CM | POA: Diagnosis not present

## 2016-02-05 DIAGNOSIS — Z7952 Long term (current) use of systemic steroids: Secondary | ICD-10-CM | POA: Diagnosis not present

## 2016-02-05 DIAGNOSIS — E785 Hyperlipidemia, unspecified: Secondary | ICD-10-CM | POA: Insufficient documentation

## 2016-02-05 DIAGNOSIS — Z7982 Long term (current) use of aspirin: Secondary | ICD-10-CM | POA: Diagnosis not present

## 2016-02-05 DIAGNOSIS — Z79899 Other long term (current) drug therapy: Secondary | ICD-10-CM | POA: Insufficient documentation

## 2016-02-05 DIAGNOSIS — X58XXXA Exposure to other specified factors, initial encounter: Secondary | ICD-10-CM | POA: Diagnosis not present

## 2016-02-05 MED ORDER — MELOXICAM 15 MG PO TABS: 15.0000 mg | ORAL_TABLET | Freq: Every day | ORAL | Status: DC

## 2016-02-05 MED ORDER — MELOXICAM 15 MG PO TABS
15.0000 mg | ORAL_TABLET | Freq: Once | ORAL | Status: AC
  Administered 2016-02-05: 15 mg via ORAL
  Filled 2016-02-05: qty 1

## 2016-02-05 NOTE — ED Notes (Signed)
She states she has had issues with left knee pain "for a while now" for which she is seen by Drs. Percell Miller and Noemi Chapel.  She is here today with worsening of left knee pain since yesterday, which is very much at extreme lat. Aspect of left knee and radiates occasionally into lat left foot.  Her left foot is entirely normal in appearance with CMS intact.  She denies any recent injury, nor any other sign of current illness.

## 2016-02-05 NOTE — ED Provider Notes (Signed)
CSN: [id number]     Arrival date & time 02/05/16  2025 History   First MD Initiated Contact with Patient 02/05/16 [phone]     Chief Complaint  Patient presents with  . Knee Pain     (Consider location/radiation/quality/duration/timing/severity/associated sxs/prior Treatment) HPI Comments: [name] is a 45 y.o. female with a PMHx of HTN, depression, chronic headaches, GERD, bronchitis, ILD, obesity, DM2, and arthritis, who presents to the ED with complaints of acute on chronic left knee pain. Patient states that she has arthritis in her knees, but yesterday her left knee gradually became worse, describing the pain is 8/10 intermittent aching in the lateral aspect of her left knee, radiating into the ankle, worse with weightbearing, and unrelieved with over-the-counter arthritis medication, BC powders for arthritis, and heat. She states she thought that the change of weather caused her arthritis to worsen, but this is slightly different than her typical arthritis pain. Denies any injuries, increased activities, pivoting or abnormal movements of the knee, or any known trauma to the knee. She denies any swelling, erythema, warmth, bruising, fevers, chills, chest pain, shortness breath, abdominal pain, N/V/D/C, urinary symptoms, numbness, tingling, or focal weakness. She sees an orthopedist, has an appt next month.  Patient is a 44 y.o. female presenting with knee pain. The history is provided by the patient. No language interpreter was used.  Knee Pain Location:  Knee Injury: no   Knee location:  L knee Pain details:    Quality:  Aching   Radiates to:  L leg   Severity:  Moderate   Onset quality:  Gradual   Timing:  Constant   Progression:  Worsening Chronicity:  Chronic Prior injury to area:  No Worsened by:  Bearing weight Ineffective treatments:  Arthritis medication Associated symptoms: no decreased ROM, no fever, no muscle weakness, no numbness, no swelling and no tingling   Risk  factors: obesity     Past Medical History  Diagnosis Date  . Dysphagia   . Allergic rhinitis   . Unspecified essential hypertension   . Mild depression   . Headache(784.0)   . Urge incontinence   . Chronic rhinitis   . GERD (gastroesophageal reflux disease)   . Cough   . Bronchitis   . ILD (interstitial lung disease) ([place])   . Morbid obesity ([place])   . Type II or diabetes mellitus, uncontrolled 11/2012 dx    Dx 12/08/12: a1c 10.0  . Dyslipidemia    Past Surgical History  Procedure Laterality Date  . Bilateral vats ablation  02/15/04   Family History  Problem Relation Age of Onset  . Sarcoidosis Mother     dxed by transbrochial bx  . Allergies Mother   . Heart disease Father   . Diabetes Father   . Allergies Father   . Coronary artery disease Father   . Cancer Maternal Grandmother     CA of unknown type  . Cancer Paternal Grandmother     CA of unknown type   Social History  Substance Use Topics  . Smoking status: Never Smoker   . Smokeless tobacco: Never Used     Comment: {  . Alcohol Use: No   OB History    No data available     Review of Systems  Constitutional: Negative for fever and chills.  Respiratory: Negative for shortness of breath.   Cardiovascular: Negative for chest pain.  Gastrointestinal: Negative for nausea, vomiting, abdominal pain, diarrhea and constipation.  Genitourinary: Negative for dysuria and  hematuria.  Musculoskeletal: Positive for arthralgias. Negative for myalgias and joint swelling.  Skin: Negative for color change.  Allergic/Immunologic: Negative for immunocompromised state.  Neurological: Negative for weakness and numbness.  Psychiatric/Behavioral: Negative for confusion.   10 Systems reviewed and are negative for acute change except as noted in the HPI.    Allergies  Penicillins  Home Medications   Prior to Admission medications   Medication Sig Start Date End Date Taking? Authorizing Provider  aspirin EC 81 MG tablet  Take 1 tablet (81 mg total) by mouth daily. 09/19/15   Rowe Clack, MD  atorvastatin (LIPITOR) 20 MG tablet TAKE 1 TABLET (20 MG TOTAL) BY MOUTH DAILY AT 6 PM. 11/30/15   Rowe Clack, MD  B-D ULTRAFINE III SHORT PEN 31G X 8 MM MISC USE NEEDLE AS NEEDED TO INJECTED INSULIN AS PRESCRIBED BY PHYSICIAN 05/26/15   Rowe Clack, MD  Blood Glucose Monitoring Suppl (ONE TOUCH ULTRA MINI) W/DEVICE KIT Use as directed to check blood sugar twice a day. Dx 250.00 12/11/12   Rowe Clack, MD  esomeprazole (NEXIUM) 20 MG capsule Take 1 cap by mouth 30-60 minutes before first meal of the day 10/13/15   Tanda Rockers, MD  hydrochlorothiazide (MICROZIDE) 12.5 MG capsule Take 1 capsule (12.5 mg total) by mouth daily as needed. 09/19/15   Rowe Clack, MD  Insulin Detemir (LEVEMIR FLEXPEN) 100 UNIT/ML Pen Inject 25 Units into the skin daily. 02/02/16   Binnie Rail, MD  loratadine (CLARITIN) 10 MG tablet Take 10 mg by mouth daily as needed (nasal drainage, drip).    Historical Provider, MD  losartan (COZAAR) 100 MG tablet Take 1 tablet (100 mg total) by mouth daily. 09/19/15   Rowe Clack, MD  mometasone-formoterol (DULERA) 100-5 MCG/ACT AERO Take 2 puffs first thing in am and then another 2 puffs about 12 hours later. 03/09/15   Tanda Rockers, MD  montelukast (SINGULAIR) 10 MG tablet Take 1 tablet (10 mg total) by mouth at bedtime. 02/02/16   Binnie Rail, MD  ONE TOUCH LANCETS MISC Use as directed to help check blood sugars twice daily. Dx 250.00 12/10/12   Rowe Clack, MD  ONE TOUCH ULTRA TEST test strip USE TO CHECK BLOOD SUGARS TWICE DAILY 05/26/15   Rowe Clack, MD  oxybutynin (DITROPAN XL) 10 MG 24 hr tablet Take 1 tablet (10 mg total) by mouth at bedtime. 09/19/15   Rowe Clack, MD  OXYGEN-HELIUM IN Inhale 2-3 L into the lungs daily.     Historical Provider, MD  oxymetazoline (AFRIN) 0.05 % nasal spray 2 puffs twice daily as needed for 5 days (before  Fluticasone)    Historical Provider, MD  predniSONE (DELTASONE) 10 MG tablet TAKE 1 TABLET (10 MG TOTAL) BY MOUTH DAILY. 12/12/15   Tanda Rockers, MD  SitaGLIPtin-MetFORMIN HCl (JANUMET XR) [phone] MG TB24 Take 1 tablet by mouth daily. 09/19/15   Rowe Clack, MD   BP 160/83 mmHg  Pulse 88  Temp(Src) 97.9 F (36.6 C) (Oral)  Resp 20  SpO2 99% Physical Exam  Constitutional: She is oriented to person, place, and time. Vital signs are normal. She appears well-developed and well-nourished.  Non-toxic appearance. No distress.  Afebrile, nontoxic, NAD  HENT:  Head: Normocephalic and atraumatic.  Mouth/Throat: Mucous membranes are normal.  Eyes: Conjunctivae and EOM are normal. Right eye exhibits no discharge. Left eye exhibits no discharge.  Neck: Normal range of motion. Neck supple.  Cardiovascular: Normal rate and intact distal pulses.   Pulmonary/Chest: Effort normal. No respiratory distress.  Abdominal: Normal appearance. She exhibits no distension.  Musculoskeletal: Normal range of motion.       Left knee: She exhibits normal range of motion, no swelling, no effusion, no deformity, no erythema, normal alignment, no LCL laxity, normal patellar mobility and no MCL laxity. Tenderness found. Lateral joint line tenderness noted.       Legs: L knee with FROM intact, with mild lateral joint line TTP near the IT band insertion/vastus lateralis insertion region, no swelling/effusion/deformity, no bruising or erythema, no warmth, no abnormal alignment or patellar mobility, no varus/valgus laxity, neg anterior drawer test, no crepitus.  Strength and sensation grossly intact, distal pulses intact, compartments soft   Neurological: She is alert and oriented to person, place, and time. She has normal strength. No sensory deficit.  Skin: Skin is warm, dry and intact. No rash noted.  Psychiatric: She has a normal mood and affect. Her behavior is normal.  Nursing note and vitals reviewed.   ED  Course  Procedures (including critical care time) Labs Review Labs Reviewed - No data to display  Imaging Review No results found. I have personally reviewed and evaluated these images and lab results as part of my medical decision-making.   EKG Interpretation None      MDM   Final diagnoses:  Left knee pain  Knee strain, left, initial encounter    45 y.o. female here with acute on chronic L knee pain. NVI with soft compartments. Tenderness to lateral joint line close to tendon insertion site for IT band and vastus lateralis. No erythema/warmth, no swelling or effusion. No trauma or reason to think this could be an occult fx, doubt need for xray imaging. Likely ligament/tendon/meniscal strain/microtear. Discussed RICE, heat therapy, mobic daily, and use of crutches for comfort to help take the strain off the knee for several days. Pt has orthopedist, schedule f/up with them in 1-2wks. I explained the diagnosis and have given explicit precautions to return to the ER including for any other new or worsening symptoms. The patient understands and accepts the medical plan as it's been dictated and I have answered their questions. Discharge instructions concerning home care and prescriptions have been given. The patient is STABLE and is discharged to home in good condition.   BP 160/83 mmHg  Pulse 88  Temp(Src) 97.9 F (36.6 C) (Oral)  Resp 20  SpO2 99%  Meds ordered this encounter  Medications  . meloxicam (MOBIC) tablet 15 mg    Sig:   . meloxicam (MOBIC) 15 MG tablet    Sig: Take 1 tablet (15 mg total) by mouth daily. TAKE WITH MEALS    Dispense:  30 tablet    Refill:  0    Order Specific Question:  Supervising Provider    Answer:  Noemi Chapel [[id number]]     Sieara Bremer Camprubi-Soms, PA-C 02/05/16 [place] Liu, MD 02/05/16 1940

## 2016-02-05 NOTE — Discharge Instructions (Signed)
Your knee pain is likely from some inflammation in the ligaments or tendons. Use crutches as needed for comfort, and to take pressure off the knee for a few days. Alternate between ice and heat throughout the day, using the ice/heat pack for no more than 20 minutes every hour, and elevate knee throughout the day. Use mobic daily for antiinflammatory properties, and to help with pain. Use tylenol as needed for additional relief. Call your orthopedist for recheck of ongoing knee pain in 1-2 weeks. Return to the ER for changes or worsening symptoms.   Knee Pain Knee pain is a common problem. It can have many causes. The pain often goes away by following your doctor's home care instructions. Treatment for ongoing pain will depend on the cause of your pain. If your knee pain continues, more tests may be needed to diagnose your condition. Tests may include X-rays or other imaging studies of your knee. HOME CARE  Take medicines only as told by your doctor.  Rest your knee and keep it raised (elevated) while you are resting.  Do not do things that cause pain or make your pain worse.  Avoid activities where both feet leave the ground at the same time, such as running, jumping rope, or doing jumping jacks.  Apply ice to the knee area:  Put ice in a plastic bag.  Place a towel between your skin and the bag.  Leave the ice on for 20 minutes, 2-3 times a day.  Ask your doctor if you should wear an elastic knee support.  Sleep with a pillow under your knee.  Lose weight if you are overweight. Being overweight can make your knee hurt more.  Do not use any tobacco products, including cigarettes, chewing tobacco, or electronic cigarettes. If you need help quitting, ask your doctor. Smoking may slow the healing of any bone and joint problems that you may have. GET HELP IF:  Your knee pain does not stop, it changes, or it gets worse.  You have a fever along with knee pain.  Your knee gives out or  locks up.  Your knee becomes more swollen. GET HELP RIGHT AWAY IF:   Your knee feels hot to the touch.  You have chest pain or trouble breathing.   This information is not intended to replace advice given to you by your health care provider. Make sure you discuss any questions you have with your health care provider.   Document Released: 12/14/2008 Document Revised: 10/08/2014 Document Reviewed: 11/18/2013 Elsevier Interactive Patient Education 2016 [place] therapy can help ease sore, stiff, injured, and tight muscles and joints. Heat relaxes your muscles, which may help ease your pain. Heat therapy should only be used on old, pre-existing, or long-lasting (chronic) injuries. Do not use heat therapy unless told by your doctor. HOW TO USE HEAT THERAPY There are several different kinds of heat therapy, including:  Moist heat pack.  Warm water bath.  Hot water bottle.  Electric heating pad.  Heated gel pack.  Heated wrap.  Electric heating pad. GENERAL HEAT THERAPY RECOMMENDATIONS   Do not sleep while using heat therapy. Only use heat therapy while you are awake.  Your skin may turn pink while using heat therapy. Do not use heat therapy if your skin turns red.  Do not use heat therapy if you have new pain.  High heat or long exposure to heat can cause burns. Be careful when using heat therapy to avoid burning your  skin.  Do not use heat therapy on areas of your skin that are already irritated, such as with a rash or sunburn. GET HELP IF:   You have blisters, redness, swelling (puffiness), or numbness.  You have new pain.  Your pain is worse. MAKE SURE YOU:  Understand these instructions.  Will watch your condition.  Will get help right away if you are not doing well or get worse.   This information is not intended to replace advice given to you by your health care provider. Make sure you discuss any questions you have with your health  care provider.   Document Released: 12/10/2011 Document Revised: 10/08/2014 Document Reviewed: 11/10/2013 Elsevier Interactive Patient Education 2016 Else[employer]  Cryotherapy Cryotherapy is when you put ice on your injury. Ice helps lessen pain and puffiness (swelling) after an injury. Ice works the best when you start using it in the first 24 to 48 hours after an injury. HOME CARE  Put a dry or damp towel between the ice pack and your skin.  You may press gently on the ice pack.  Leave the ice on for no more than 10 to 20 minutes at a time.  Check your skin after 5 minutes to make sure your skin is okay.  Rest at least 20 minutes between ice pack uses.  Stop using ice when your skin loses feeling (numbness).  Do not use ice on someone who cannot tell you when it hurts. This includes small children and people with memory problems (dementia). GET HELP RIGHT AWAY IF:  You have white spots on your skin.  Your skin turns Tool or pale.  Your skin feels waxy or hard.  Your puffiness gets worse. MAKE SURE YOU:   Understand these instructions.  Will watch your condition.  Will get help right away if you are not doing well or get worse.   This information is not intended to replace advice given to you by your health care provider. Make sure you discuss any questions you have with your health care provider.   Document Released: 03/05/2008 Document Revised: 12/10/2011 Document Reviewed: 05/10/2011 Elsevier Interactive Patient Education 2016 [place] DO? Elastic bandages come in different shapes and sizes. They generally provide support to your injury and reduce swelling while you are healing, but they can perform different functions. Your health care provider will help you to decide what is best for your protection, recovery, or rehabilitation following an injury. WHAT ARE SOME GENERAL TIPS FOR USING AN ELASTIC  BANDAGE?  Use the bandage as directed by the maker of the bandage that you are using.  Do not wrap the bandage too tightly. This may cut off the circulation in the arm or leg in the area below the bandage.  If part of your body beyond the bandage becomes [place], numb, cold, swollen, or is more painful, your bandage is most likely too tight. If this occurs, remove your bandage and reapply it more loosely.  See your health care provider if the bandage seems to be making your problems worse rather than better.  An elastic bandage should be removed and reapplied every 3-4 hours or as directed by your health care provider. WHAT IS RICE? The routine care of many injuries includes rest, ice, compression, and elevation (RICE therapy).  Rest Rest is required to allow your body to heal. Generally, you can resume your routine activities when you are comfortable and have been  given permission by your health care provider. Ice Icing your injury helps to keep the swelling down and it reduces pain. Do not apply ice directly to your skin.  Put ice in a plastic bag.  Place a towel between your skin and the bag.  Leave the ice on for 20 minutes, 2-3 times per day. Do this for as long as you are directed by your health care provider. Compression Compression helps to keep swelling down, gives support, and helps with discomfort. Compression may be done with an elastic bandage. Elevation Elevation helps to reduce swelling and it decreases pain. If possible, your injured area should be placed at or above the level of your heart or the center of your chest. [place]? You should seek medical care if:  You have persistent pain and swelling.  Your symptoms are getting worse rather than improving. These symptoms may indicate that further evaluation or further X-rays are needed. Sometimes, X-rays may not show a small broken bone (fracture) until a number of days later. Make a follow-up  appointment with your health care provider. Ask when your X-ray results will be ready. Make sure that you get your X-ray results. WHEN SHOULD I SEEK IMMEDIATE MEDICAL CARE? You should seek immediate medical care if:  You have a sudden onset of severe pain at or below the area of your injury.  You develop redness or increased swelling around your injury.  You have tingling or numbness at or below the area of your injury that does not improve after you remove the elastic bandage.   This information is not intended to replace advice given to you by your health care provider. Make sure you discuss any questions you have with your health care provider.   Document Released: 03/09/2002 Document Revised: 06/08/2015 Document Reviewed: 05/03/2014 Elsevier Interactive Patient Education [employer].

## 2016-02-06 ENCOUNTER — Encounter: Payer: Self-pay | Admitting: Internal Medicine

## 2016-02-06 ENCOUNTER — Ambulatory Visit (INDEPENDENT_AMBULATORY_CARE_PROVIDER_SITE_OTHER): Payer: BC Managed Care – PPO | Admitting: Internal Medicine

## 2016-02-06 VITALS — BP 128/88 | HR 72 | Ht 60.0 in | Wt 284.0 lb

## 2016-02-06 DIAGNOSIS — J8410 Pulmonary fibrosis, unspecified: Secondary | ICD-10-CM

## 2016-02-06 DIAGNOSIS — J45991 Cough variant asthma: Secondary | ICD-10-CM | POA: Diagnosis not present

## 2016-02-06 DIAGNOSIS — J9611 Chronic respiratory failure with hypoxia: Secondary | ICD-10-CM | POA: Diagnosis not present

## 2016-02-06 NOTE — Assessment & Plan Note (Signed)
Body mass index is 55.47    Lab Results  Component Value Date   TSH 1.01 09/19/2015     Contributing to gerd tendency/ doe/reviewed the need and the process to achieve and maintain neg calorie balance > defer f/u primary care including intermittently monitoring thyroid status

## 2016-02-06 NOTE — Assessment & Plan Note (Signed)
-   PFTs.  03/09/2015  14% better p B2 and less cough p saba - 03/09/2015   > try dulera 100 2bid  - 02/06/2016  After extensive coaching HFA effectiveness =    75%   singulair seems to have helped so may be able to combine singulair and perfect hfa technique and reduce prednisone - see NSIP a/p  I had an extended discussion with the patient reviewing all relevant studies completed to date and  lasting 15 to 20 minutes of a 25 minute visit    Each maintenance medication was reviewed in detail including most importantly the difference between maintenance and prns and under what circumstances the prns are to be triggered using an action plan format that is not reflected in the computer generated alphabetically organized AVS.    Please see instructions for details which were reviewed in writing and the patient given a copy highlighting the part that I personally wrote and discussed at today's ov.

## 2016-02-06 NOTE — Progress Notes (Signed)
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  Since then waxing/waning sx requiring intermittent prednisone tapers with only minimal improvement - most of her symptoms chronically have been related to obesity with dyspnea on exertion and tendency to chronic cough which is felt to be at least partly  reflux related but improved with saba 03/09/2015 so started on dulera 100 2bid in addition to maint pred and seems to have helped but never able to get < 10 mg pred s flare cough/sob     History of Present Illness  November 07, 2010 ov cc cough/ strangling  a bit more with taper off reglan (actually worse before ran out of the low dose reglan)  no excess mucus or increase sob on prednisone @  10 mg daily .  Using med calendar well rec 1)  Ok to resume water aerobics but pace yourself 2)  Wait until your swallowing evaulation is complete before making a decision re reglan > stopped it on her own.  3)  Try Prednisone 10 mg one-half each am   04/04/2012 f/u ov/[name] no longer using med calendar on Pred 10 mg one daily  added hyzaar to rx for hbp/leg swelling not back to mall walking yet,  No unusual cough, purulent sputum or sinus/hb symptoms on present rx. No variability to symptoms or need for inhaler. Rec Try prednisone 10 mg one half daily to see if affects your ability to walk the mall on 02 or the cough gets worse (walk the mall first for at least before you try) Always walk for exercise on 3lpm - this will help you burn fat and get into negative calorie balance to lose weight.   03/17/2013 f/u ov/[name] re NSIP on floor of 10 mg per day Chief Complaint  Patient presents with  . Follow-up    Breathing is overall doing well and she denies any new co's today.   ex on a treadmill x 53m x 3 x daily on 2lpm not the 3lpm as rec >>Prednisone ceiling  is 20 mg per day and floor is 10 mg per day        10/06/2014 f/u ov/[name] re: pred 10 mg daily after trying 10/5 until Tgiving and worse cough/ sob  Chief Complaint  Patient presents with  . Follow-up    F/U ILD; breathing doing fine; no complaints  Not doing treadmill  Any more but Not limited by breathing from desired activities   rec Resume treadmill x 30 min daily minimum If  doing great try to reduce prednisone to 10 mg alternating with 5 mg daily as your new floor but increase back to ceiling of 20 mg per day if needed     03/09/2015 f/u ov/[name] re: NSIP  pred 10 mg daily could not tol 10/5  Chief Complaint  Patient presents with  . Follow-up    PFT done today. Pt states her breathing is overall doing well, unless she gets exposed to hot/humid weather. She is taking 10 mg pred daily.   2lpm sleep/ 3 lpm ex , flare of cough with < 10 mg per day pred, better cough p albuterol  rec Work on inhaler technique:    Try dulera 100 Take 2 puffs first thing in am and then another 2  puffs about 12 hours later> helped some     10/21/2015  f/u ov/[name] re: NSIP/ 02 at hs and with ex pred at 10 mg daily  Chief Complaint  Patient presents with  . Follow-up    Pt states that her breathing is doing well. She denies any co's today.   lots of stress at home related to husband's accident and not following diet/ ex plans but trying to get back into it and requesting poc. rec Please see patient coordinator before you leave today  to schedule [facility] eval for portable 02  Keep working on diet / exercise   02/06/2016  f/u ov/[name] re: NSIP / 3lpm with ex/ duler 100 2bid and pred 10 mg daily  Chief Complaint  Patient presents with  . Follow-up    Pt states that she did have some issues with allergies but is improving. Pt also was having issues with Dulera, jittery after use, but has started rinsing her mouth after use and side effects have improved. Pt denies cough/wheeze/SOB/CP/tightness.    limited by L knee from doing treadmill, has trainer but not losing wt  Rhinitis symptoms better on singulair    No obvious day to day or daytime variabilty or assoc excess/ purulent sputum or mucus plugs   cp or chest tightness, subjective wheeze overt sinus or hb symptoms. No unusual exp hx or h/o childhood pna/ asthma or knowledge of premature birth.  Sleeping ok on 02 2lpm without nocturnal  or early am exacerbation  of respiratory  c/o's or need for noct saba. Also denies any obvious fluctuation of symptoms with weather or environmental changes or other aggravating or alleviating factors except as outlined above   Current Medications, Allergies, Complete Past Medical History, Past Surgical History, Family History, and Social History were reviewed in [employer] record.  ROS  The following are not active complaints unless bolded sore throat, dysphagia, dental problems, itching, sneezing,  nasal congestion or excess/ purulent secretions, ear ache,   fever, chills, sweats, unintended wt loss, pleuritic or exertional cp, hemoptysis,  orthopnea pnd or leg swelling, presyncope, palpitations, heartburn, abdominal pain, anorexia, nausea, vomiting, diarrhea  or change in bowel or urinary habits, change in stools or urine, dysuria,hematuria,  rash, arthralgias L knee, visual complaints, headache, numbness weakness or ataxia or problems with walking or coordination,  change in mood/affect or memory.             Past Medical History: GASTROESOPHAGEAL REFLUX DISEASE     - Tapered reglan to 10 mg one half twice daily June 22, 2010 > d/c'd 11/2010  -restarted reglan 03/2011 >>unable to tolerate.  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)   -Restart Prednisone 02/24/08   -PFT's December 23, 2008 VC 35%  DLC0 31%   -Desats with > 2 laps 06/22/10 so rec wear 02 2lpm at bedtime and with ex   MORBID OBESITY   - [employer] wt  =  153  for BMI < 30  Hypertension Health Maintenance..........................................................Marland KitchenMarland KitchenAsa Lente    - Pneumovax July 22, 2009      - Flu 06/21/2011  Complex med regimen 06/21/2011 , 06/15/2013         Objective:   Physical Exam  Massively obese pleasant amb bf nad /now on crutches/ vital signs reviewed    wt  305 May 28, 2008 >   302 November 07, 2010 >   303 03/22/2011 > 10/25/2011  299 > 311 04/04/2012 >  306 03/17/2013 >06/15/2013 303 > 10/06/2013 298  > 02/08/2014 289 > 10/06/2014   288 > 03/09/2015 283 > 10/21/2015    283 >  02/06/2016  284   HEENT: nl dentition, turbinates, and orophanx. Nl external ear canals without cough reflex Neck without JVD/Nodes/TM Lungs distant bs,  Distant BS w/ no wheezing / no cough on insp RRR no s3 or murmur or increase in P2. no edema Abd soft and benign with nl excursion in the supine position. Ext warm without calf tenderness, cyanosis clubbing        Assessment & Plan:

## 2016-02-06 NOTE — Patient Instructions (Signed)
Work on inhaler technique:  relax and gently blow all the way out then take a nice smooth deep breath back in, triggering the inhaler at same time you start breathing in.  Hold for up to 5 seconds if you can. Blow out thru nose. Rinse and gargle with water when done     Should be able to tolerate dulera 100 Take 2 puffs first thing in am and then another 2 puffs about 12 hours later.     If so, try prednisone 10 - 5  = even days take a half, odd days take half  Weight control is simply a matter of calorie balance which needs to be tilted in your favor by eating less and exercising more.  To get the most out of exercise, you need to be continuously aware that you are short of breath, but never out of breath, for 30 minutes daily. As you improve, it will actually be easier for you to do the same amount of exercise  in  30 minutes so always push to the level where you are short of breath.  If this does not result in gradual weight reduction then I strongly recommend you see a nutritionist with a food diary x 2 weeks so that we can work out a negative calorie balance which is universally effective in steady weight loss programs.  Think of your calorie balance like you do your bank account where in this case you want the balance to go down so you must take in less calories than you burn up.  It's just that simple:  Hard to do, but easy to understand.  Good luck!   Please schedule a follow up visit in 3 months but call sooner if needed

## 2016-02-06 NOTE — Assessment & Plan Note (Signed)
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)   -Restarted Prednisone 02/24/08   -PFT's December 23, 2008 VC 35%  DLC0 31%   -PFT's 02/08/2014   VC 1.30 (49%) and DLCO is 70%  > rec pred 10/5 > did not tolerate - PFTs  03/09/2015     VC 1.27 (48%) and dlco is 72% @ 10 mg daily  - 02/06/2016 try 10 a/w 5 mg daily using the lowest effective dose concept

## 2016-02-06 NOTE — Assessment & Plan Note (Signed)
-   Started on 02 06/22/10   - 04/05/2012  Walked 2lpm x 3 laps @ 185 ft each stopped due to  desat corrected on 3lpm so rec 3lpm with exertion  - 10/06/2013  Walked RA x 2 laps @ 185 ft each stopped due to  Cough and desat to 86% at more rapid pace than usual and on 3lpm could do 3 laps s difficulty   BEST FIT last OV = portable tank at 3lpm with ex doing fine    rx  As of 02/06/2016 = no 02 at rest, 3lpm with exertion, 2lpm at hs

## 2016-03-06 ENCOUNTER — Other Ambulatory Visit: Payer: Self-pay

## 2016-03-06 ENCOUNTER — Ambulatory Visit (INDEPENDENT_AMBULATORY_CARE_PROVIDER_SITE_OTHER): Payer: BC Managed Care – PPO | Admitting: Family Medicine

## 2016-03-06 ENCOUNTER — Ambulatory Visit (INDEPENDENT_AMBULATORY_CARE_PROVIDER_SITE_OTHER)
Admission: RE | Admit: 2016-03-06 | Discharge: 2016-03-06 | Disposition: A | Discharge status: Home / Self Care | Payer: BC Managed Care – PPO | Source: Ambulatory Visit | Attending: Family Medicine | Admitting: Family Medicine

## 2016-03-06 ENCOUNTER — Encounter: Payer: Self-pay | Admitting: Family Medicine

## 2016-03-06 VITALS — BP 124/84 | HR 69 | Ht 60.0 in | Wt 283.0 lb

## 2016-03-06 DIAGNOSIS — M25562 Pain in left knee: Secondary | ICD-10-CM

## 2016-03-06 MED ORDER — DICLOFENAC SODIUM 2 % TD SOLN: TRANSDERMAL | Status: DC

## 2016-03-06 NOTE — Assessment & Plan Note (Signed)
Encourage weight loss. 

## 2016-03-06 NOTE — Progress Notes (Signed)
[facility] [place] [place], [place] [address] Phone: [phone] Subjective:    I'm seeing this patient by the request  of:  Binnie Rail, MD   CC: Knee pain, left  [id number] [name] is a 45 y.o. female coming in with complaint of knee pain. This is left-sided. Patient states it has been going on for multiple months but for the last 6 weeks it is Considerably worse. Patient states his been waking her up at night and she did go to the emergency room. There is a concern for possible meniscal injury burst possible ligamentous injury. Patient was told to follow-up here. Patient has described this to her primary care provider as well. Patient states that sometimes it seems to be swollen. Can radiate down her leg. Denies any back pain associated with it. Rates the severity pain is 8 out of 10. Sometimes has some mild instability but has never fallen. Difficult to stand for long amount time.     Past Medical History  Diagnosis Date  . Dysphagia   . Allergic rhinitis   . Unspecified essential hypertension   . Mild depression   . Headache(784.0)   . Urge incontinence   . Chronic rhinitis   . GERD (gastroesophageal reflux disease)   . Cough   . Bronchitis   . ILD (interstitial lung disease) ([place])   . Morbid obesity ([place])   . Type II or diabetes mellitus, uncontrolled 11/2012 dx    Dx 12/08/12: a1c 10.0  . Dyslipidemia    Past Surgical History  Procedure Laterality Date  . Bilateral vats ablation  02/15/04   Social History   Social History  . Marital Status: Married    Spouse Name: N/A  . Number of Children: 1  . Years of Education: N/A   Occupational History  . 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 Smoking status: Never Smoker   . Smokeless tobacco: Never Used     Comment: {  . Alcohol Use: No  . Drug Use: No  . Sexual Activity: Not Asked   Other Topics Concern   . 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 Penicillins    Family History  Problem Relation Age of Onset  . Sarcoidosis Mother     dxed by transbrochial bx  . Allergies Mother   . Heart disease Father   . Diabetes Father   . Allergies Father   . Coronary artery disease Father   . Cancer Maternal Grandmother     CA of unknown type  . Cancer Paternal Grandmother     CA of unknown type    Past medical history, social, surgical and family history all reviewed in electronic medical record.  No pertanent information unless stated regarding to the chief complaint.   Review of Systems: No headache, visual changes, nausea, vomiting, diarrhea, constipation, dizziness, abdominal pain, skin rash, fevers, chills, night sweats, weight loss, swollen lymph nodes, body aches, joint swelling, muscle aches, chest pain, shortness of breath, mood changes.   Objective Blood pressure 124/84, pulse 69, height 5' (1.524 m), weight 283 lb (128.368 kg), SpO2 98 %.  General: No apparent distress alert and oriented x3 mood and affect normal, dressed appropriately. Morbidly obese HEENT: Pupils  equal, extraocular movements intact  Respiratory: Patient's speak in full sentences and does not appear short of breath  Cardiovascular: No lower extremity edema, non tender, no erythema  Skin: Warm dry intact with no signs of infection or rash on extremities or on axial skeleton.  Abdomen: Soft nontender  Neuro: Cranial nerves II through XII are intact, neurovascularly intact in all extremities with 2+ DTRs and 2+ pulses.  Lymph: No lymphadenopathy of posterior or anterior cervical chain or axillae bilaterally.  Gait normal with good balance and coordination.  MSK:  Non tender with full range of motion and good stability and symmetric strength and tone of shoulders, elbows, wrist, hip, and ankles  bilaterally.  Knee: Left Difficult to assess secondary to patient's body habitus Tender over the medial and lateral joint line ROM full in flexion and extension and lower leg rotation. Very mild instability with valgus stress Positive Mcmurray's, Apley's, and Thessalonian tests. Mild painful patellar compression. Patellar glide with mild crepitus. Patellar and quadriceps tendons unremarkable. Hamstring and quadriceps strength is normal.  Contralateral knee also moderately tender to palpation.   MSK US performed of: Left knee This study was ordered, performed, and interpreted by Charlann Boxer D.O.  Knee: Difficult scan secondary to patient's body habitus. Anterior medial meniscus does have some mild displacement and degenerative changes noted with increasing Doppler flow.  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 Seems chronic. Patient also has some mild hypoechoic changes within the popliteal tendon sheath. 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  This included exercises focusing on stretching, strengthening, with significant focus on eccentric aspects.  Flexion and extension exercises working on the vastus medialis oblique. More non-weightbearing exercises secondary to patient's body habitus. Stretching of the hamstrings and strengthening of the hip abductors also encouraged. Proper technique shown and discussed handout in great detail with ATC.  All questions were discussed and answered.     Impression and Recommendations:     This case required medical decision making of moderate complexity.      Note: This dictation was prepared with Dragon dictation  along with smaller phrase technology. Any transcriptional errors that result from this process are unintentional.        '

## 2016-03-06 NOTE — Progress Notes (Signed)
Pre visit review using our clinic review tool, if applicable. No additional management support is needed unless otherwise documented below in the visit note. 

## 2016-03-06 NOTE — Patient Instructions (Signed)
Good to see you.  Xray downstairs Ice 20 minutes 2 times daily. Usually after activity and before bed. Exercises 3 times a week.  pennsaid pinkie amount topically 2 times daily as needed.  Continue the meloxicam daily for 5 days then only as needed Vitamin D 2000 IU daily  Turmeric 500mg  daily  Iron 65mg  daily with 500mg  of vitamin C to help with night time pain  Avoid twisting motions.  Good shoes with rigid bottom.  [name], [place] or New balance greater then 700 OK to do treadmill but short strides and no incline.  See me again in 4 weeks an if not better we will consider injection and possible physical therapy .

## 2016-03-06 NOTE — Assessment & Plan Note (Signed)
I do believe the patient's left knee pain is multifactorial. It does appear the patient has some patellofemoral arthritis as well as some medial arthritis. Pain is on the lateral aspect. On ultrasound today it does appear the patient patient does have some mild popliteus tendinitis as well as a discoid lateral meniscus. This should be more of a chronic pain. Patient's body habitus is also not helping. Patient given a prescription for topical anti-inflammatories and work with Product/process development scientist today. X-rays are pending. Patient will avoid certain activities. Will start increasing exercise though with more nonweightbearing. Patient will see me again in 4 weeks. If worsening symptoms we'll consider injection and possible formal physical therapy.

## 2016-03-19 ENCOUNTER — Other Ambulatory Visit: Payer: Self-pay | Admitting: Internal Medicine

## 2016-04-06 ENCOUNTER — Ambulatory Visit: Payer: BC Managed Care – PPO | Admitting: Family Medicine

## 2016-05-08 ENCOUNTER — Ambulatory Visit: Payer: BC Managed Care – PPO | Admitting: Internal Medicine

## 2016-05-09 ENCOUNTER — Other Ambulatory Visit: Payer: Self-pay | Admitting: Emergency Medicine

## 2016-05-09 MED ORDER — INSULIN DETEMIR 100 UNIT/ML FLEXPEN: 25.0000 [IU] | PEN_INJECTOR | Freq: Every day | SUBCUTANEOUS | 2 refills | Status: DC

## 2016-05-16 ENCOUNTER — Other Ambulatory Visit (INDEPENDENT_AMBULATORY_CARE_PROVIDER_SITE_OTHER): Payer: BC Managed Care – PPO

## 2016-05-16 ENCOUNTER — Ambulatory Visit (INDEPENDENT_AMBULATORY_CARE_PROVIDER_SITE_OTHER): Payer: BC Managed Care – PPO | Admitting: Internal Medicine

## 2016-05-16 ENCOUNTER — Encounter: Payer: Self-pay | Admitting: Internal Medicine

## 2016-05-16 VITALS — BP 124/88 | HR 94 | Temp 98.3°F | Resp 18 | Ht 60.0 in | Wt 282.0 lb

## 2016-05-16 DIAGNOSIS — E785 Hyperlipidemia, unspecified: Secondary | ICD-10-CM

## 2016-05-16 DIAGNOSIS — I10 Essential (primary) hypertension: Secondary | ICD-10-CM | POA: Diagnosis not present

## 2016-05-16 DIAGNOSIS — Z794 Long term (current) use of insulin: Secondary | ICD-10-CM

## 2016-05-16 DIAGNOSIS — J302 Other seasonal allergic rhinitis: Secondary | ICD-10-CM

## 2016-05-16 DIAGNOSIS — E1165 Type 2 diabetes mellitus with hyperglycemia: Secondary | ICD-10-CM

## 2016-05-16 LAB — MICROALBUMIN / CREATININE URINE RATIO
CREATININE, U: 135.3 mg/dL
MICROALB/CREAT RATIO: 0.8 mg/g (ref 0.0–30.0)
Microalb, Ur: 1.1 mg/dL (ref 0.0–1.9)

## 2016-05-16 LAB — COMPREHENSIVE METABOLIC PANEL
ALT: 16 U/L (ref 0–35)
AST: 11 U/L (ref 0–37)
Albumin: 3.9 g/dL (ref 3.5–5.2)
Alkaline Phosphatase: 83 U/L (ref 39–117)
BUN: 12 mg/dL (ref 6–23)
CO2: 30 meq/L (ref 19–32)
Calcium: 9.9 mg/dL (ref 8.4–10.5)
Chloride: 99 mEq/L (ref 96–112)
Creatinine, Ser: 0.71 mg/dL (ref 0.40–1.20)
GFR: 114.94 mL/min (ref >60.00)
GLUCOSE: 234 mg/dL — AB (ref 70–99)
POTASSIUM: 4 meq/L (ref 3.5–5.1)
Sodium: 135 mEq/L (ref 135–145)
Total Bilirubin: 0.3 mg/dL (ref 0.2–1.2)
Total Protein: 7.5 g/dL (ref 6.0–8.3)

## 2016-05-16 LAB — HEMOGLOBIN A1C: Hgb A1c MFr Bld: 10 % — ABNORMAL HIGH (ref 4.6–6.5)

## 2016-05-16 MED ORDER — INSULIN DETEMIR 100 UNIT/ML FLEXPEN: 30.0000 [IU] | PEN_INJECTOR | Freq: Every day | SUBCUTANEOUS | 5 refills | Status: DC

## 2016-05-16 NOTE — Assessment & Plan Note (Signed)
On lipitor 20 mg daily Lipids has been adequately controlled  Continue above

## 2016-05-16 NOTE — Assessment & Plan Note (Signed)
BP well controlled at home - did not take medication today Current regimen effective and well tolerated Continue current medications at current doses

## 2016-05-16 NOTE — Assessment & Plan Note (Addendum)
Sugars not controlled Compliant with medications Not as compliant with a diabetic diet Not exercising regularly Continue janumet at current dose Increase insulin to 30 units daily Stressed exercise and weight loss Check a1c

## 2016-05-16 NOTE — Progress Notes (Signed)
Pre visit review using our clinic review tool, if applicable. No additional management support is needed unless otherwise documented below in the visit note. 

## 2016-05-16 NOTE — Patient Instructions (Addendum)
  Test(s) ordered today. Your results will be released to [place] (or called to you) after review, usually within 72hours after test completion. If any changes need to be made, you will be notified at that same time.  All other Health Maintenance issues reviewed.   All recommended immunizations and age-appropriate screenings are up-to-date or discussed.  No immunizations administered today.   Medications reviewed and updated.  Changes include increasing the insulin to 30 units daily.  Your prescription(s) have been submitted to your pharmacy. Please take as directed and contact our office if you believe you are having problem(s) with the medication(s).   Please followup in 3 months

## 2016-05-16 NOTE — Assessment & Plan Note (Signed)
Stressed regular exercise  Decrease portions

## 2016-05-16 NOTE — Assessment & Plan Note (Signed)
Controlled with current medication singulair helped - will take as needed during allergy seasons

## 2016-05-16 NOTE — Progress Notes (Signed)
Subjective:    Patient ID: [name], female    DOB: Oct 31, 1970, 45 y.o.   MRN: [id number]  HPI She is here for follow up.  Diabetes: She is was not always taking her insulin at her last visitI advised her to switch the morning. She is taking the Janumet daily. She is somewhat compliant with a diabetic diet. She is not exercising regularly. She monitors her sugars and they have been running 140-215.  She does feel intermittent burning pain in her right big toe.  She is up-to-date with an ophthalmology examination.   Hypertension: She is taking her medication daily. She is compliant with a low sodium diet.  She denies chest pain, palpitations, edema, shortness of breath and regular headaches. She is not exercising regularly.  She checks her BP at home on occasion - 118/78.   Hyperlipidemia: She is taking her medication daily. She is compliant with a low fat/cholesterol diet. She is not exercising regularly.   Interstitial lung dz, asthma, allergies:  She takes prednisone 10 mg dialy.  She wears oxygen at night and with exercise. Her allergies were not well controlled at her last visit and we added singulair. She feels this did help, but stopped taking it when the allergies got better.  She plans on restarting in the fall when her allergies kick up.    Medications and allergies reviewed with patient and updated if appropriate.  Patient Active Problem List   Diagnosis Date Noted  . Knee pain, left 02/02/2016  . Cough variant asthma 03/09/2015  . Diabetes type 2, uncontrolled ([place]) 12/09/2012  . Dyslipidemia   . Chronic respiratory failure with hypoxia ([place]) 04/05/2012  . CONJUNCTIVITIS, ALLERGIC, SEASONAL 01/05/2010  . Allergic rhinitis 01/05/2010  . Essential hypertension 09/07/2009  . MUSCLE SPASM 09/07/2009  . DEPRESSION, MILD 03/24/2009  . HEADACHE 03/24/2009  . URINARY INCONTINENCE, URGE 03/24/2009  . CHRONIC RHINITIS 12/23/2008  . Severe obesity (BMI >= 40) ([place]) 10/10/2007    . INTERSTITIAL LUNG DISEASE 10/10/2007  . GASTROESOPHAGEAL REFLUX DISEASE 10/10/2007    Current Outpatient Prescriptions on File Prior to Visit  Medication Sig Dispense Refill  . aspirin EC 81 MG tablet Take 1 tablet (81 mg total) by mouth daily. 150 tablet 2  . atorvastatin (LIPITOR) 20 MG tablet TAKE 1 TABLET (20 MG TOTAL) BY MOUTH DAILY AT 6 PM. 90 tablet 2  . B-D ULTRAFINE III SHORT PEN 31G X 8 MM MISC USE NEEDLE AS NEEDED TO INJECTED INSULIN AS PRESCRIBED BY PHYSICIAN 100 each 2  . Diclofenac Sodium 2 % SOLN Apply 1 pump twice daily as needed. 112 g 3  . esomeprazole (NEXIUM) 20 MG capsule Take 1 cap by mouth 30-60 minutes before first meal of the day 30 capsule 5  . hydrochlorothiazide (MICROZIDE) 12.5 MG capsule Take 1 capsule (12.5 mg total) by mouth daily as needed. 30 capsule 2  . Insulin Detemir (LEVEMIR FLEXPEN) 100 UNIT/ML Pen Inject 25 Units into the skin daily. -- Fill for 30 day prescription. 15 mL 2  . loratadine (CLARITIN) 10 MG tablet Take 10 mg by mouth daily as needed (nasal drainage, drip).    Marland Kitchen losartan (COZAAR) 100 MG tablet Take 1 tablet (100 mg total) by mouth daily. 90 tablet 2  . meloxicam (MOBIC) 15 MG tablet Take 1 tablet (15 mg total) by mouth daily. TAKE WITH MEALS 30 tablet 0  . mometasone-formoterol (DULERA) 100-5 MCG/ACT AERO Take 2 puffs first thing in am and then another 2  puffs about 12 hours later. 1 Inhaler 11  . montelukast (SINGULAIR) 10 MG tablet Take 1 tablet (10 mg total) by mouth at bedtime. 30 tablet 5  . ONE TOUCH ULTRA TEST test strip USE TO CHECK BLOOD SUGARS TWICE DAILY 100 each 4  . oxybutynin (DITROPAN XL) 10 MG 24 hr tablet Take 1 tablet (10 mg total) by mouth at bedtime. 90 tablet 1  . OXYGEN-HELIUM IN Inhale 2-3 L into the lungs daily.     Marland Kitchen oxymetazoline (AFRIN) 0.05 % nasal spray 2 puffs twice daily as needed for 5 days (before Fluticasone)    . predniSONE (DELTASONE) 10 MG tablet TAKE 1 TABLET BY MOUTH DAILY 90 tablet 1  .  SitaGLIPtin-MetFORMIN HCl (JANUMET XR) [phone] MG TB24 Take 1 tablet by mouth daily. 90 tablet 2  . B-D ULTRAFINE III SHORT PEN 31G X 8 MM MISC USE NEEDLE AS NEEDED TO INJECTED INSULIN AS PRESCRIBED BY PHYSICIAN 100 each 2  . Blood Glucose Monitoring Suppl (ONE TOUCH ULTRA MINI) W/DEVICE KIT Use as directed to check blood sugar twice a day. Dx 250.00 1 each 0  . loratadine (CLARITIN) 10 MG tablet Take 10 mg by mouth daily as needed (nasal drainage, drip).    . mometasone-formoterol (DULERA) 100-5 MCG/ACT AERO Take 2 puffs first thing in am and then another 2 puffs about 12 hours later. 1 Inhaler 11  . ONE TOUCH LANCETS MISC Use as directed to help check blood sugars twice daily. Dx 250.00 60 each 5  . ONE TOUCH ULTRA TEST test strip USE TO CHECK BLOOD SUGARS TWICE DAILY 100 each 4  . [DISCONTINUED] DETROL LA 2 MG 24 hr capsule TAKE 1 CAPSULE BY MOUTH DAILY 30 capsule 10   No current facility-administered medications on file prior to visit.     Past Medical History:  Diagnosis Date  . Allergic rhinitis   . Bronchitis   . Chronic rhinitis   . Cough   . Dyslipidemia   . Dysphagia   . GERD (gastroesophageal reflux disease)   . Headache(784.0)   . ILD (interstitial lung disease) ([place])   . Mild depression   . Morbid obesity ([place] [place])   . Type II or diabetes mellitus, uncontrolled 11/2012 dx   Dx 12/08/12: a1c 10.0  . Unspecified essential hypertension   . Urge incontinence     Past Surgical History:  Procedure Laterality Date  . BILATERAL VATS ABLATION  02/15/04    Social History   Social History  . Marital status: Married    Spouse name: N/A  . Number of children: 1  . Years of education: N/A   Occupational History  . 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 Smoking status: Never Smoker  . Smokeless tobacco: Never Used     Comment: {  . Alcohol use No  . Drug use: No  . Sexual activity: Not Asked   Other  Topics Concern  . 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 Sarcoidosis Mother     dxed by transbrochial bx  . Allergies Mother   . Heart disease Father   . Diabetes Father   . Allergies Father   . Coronary artery disease Father   . Cancer Maternal Grandmother  CA of unknown type  . Cancer Paternal Grandmother     CA of unknown type    Review of Systems  Constitutional: Negative for chills and fever.  Respiratory: Negative for cough, shortness of breath and wheezing.   Cardiovascular: Negative for chest pain, palpitations and leg swelling.  Neurological: Positive for numbness (burning type pain in right toe). Negative for dizziness, light-headedness and headaches.       Objective:   Vitals:   05/16/16 0836  BP: 124/88  Pulse: 94  Resp: 18  Temp: 98.3 F (36.8 C)   Filed Weights   05/16/16 0836  Weight: 282 lb (127.9 kg)   Body mass index is 55.07 kg/m.   Physical Exam Constitutional: Appears well-developed and well-nourished. No distress.  HENT:  Head: Normocephalic and atraumatic.  Neck: Neck supple. No tracheal deviation present. No thyromegaly present.  Cardiovascular: Normal rate, regular rhythm and normal heart sounds.   No murmur heard. No carotid bruit  Pulmonary/Chest: Effort normal and breath sounds normal. No respiratory distress. No has no wheezes. No rales.  Musculoskeletal: No edema.  Lymphadenopathy: No cervical adenopathy.  Skin: Skin is warm and dry. Not diaphoretic.  Psychiatric: Normal mood and affect. Behavior is normal.       Assessment & Plan:   See Problem List for Assessment and Plan of chronic medical problems.  F/u in 3 months  F/u in 3 months

## 2016-05-30 ENCOUNTER — Encounter: Payer: Self-pay | Admitting: Student

## 2016-06-01 ENCOUNTER — Ambulatory Visit (INDEPENDENT_AMBULATORY_CARE_PROVIDER_SITE_OTHER): Payer: BC Managed Care – PPO | Admitting: Internal Medicine

## 2016-06-01 ENCOUNTER — Encounter: Payer: Self-pay | Admitting: Internal Medicine

## 2016-06-01 VITALS — BP 130/90 | HR 97 | Ht 60.0 in | Wt 282.0 lb

## 2016-06-01 DIAGNOSIS — J9611 Chronic respiratory failure with hypoxia: Secondary | ICD-10-CM | POA: Diagnosis not present

## 2016-06-01 DIAGNOSIS — J8410 Pulmonary fibrosis, unspecified: Secondary | ICD-10-CM | POA: Diagnosis not present

## 2016-06-01 DIAGNOSIS — J45991 Cough variant asthma: Secondary | ICD-10-CM

## 2016-06-01 MED ORDER — ESOMEPRAZOLE MAGNESIUM 20 MG PO CPDR
20.0000 mg | DELAYED_RELEASE_CAPSULE | Freq: Two times a day (BID) | ORAL | Status: DC
Start: 2016-06-01 — End: 2016-08-31

## 2016-06-01 NOTE — Assessment & Plan Note (Signed)
Body mass index is 55.07  Very minimal progress  Lab Results  Component Value Date   TSH 1.01 09/19/2015     Contributing to gerd tendency/ doe/reviewed the need and the process to achieve and maintain neg calorie balance > defer f/u primary care including intermittently monitoring thyroid status

## 2016-06-01 NOTE — Assessment & Plan Note (Signed)
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)   -Restarted Prednisone 02/24/08   -PFT's December 23, 2008 VC 35%  DLC0 31%   -PFT's 02/08/2014   VC 1.30 (49%) and DLCO is 70%  > rec pred 10/5 > did not tolerate - PFTs  03/09/2015     VC 1.27 (48%) and dlco is 72% @ 10 mg daily  - 02/06/2016 try 10 a/w 5 mg daily > did not try - 06/01/2016 try 10 a/w 5 daily   The goal with a chronic steroid dependent illness is always arriving at the lowest effective dose that controls the disease/symptoms and not accepting a set "formula" which is based on statistics or guidelines that don't always take into account patient  variability or the natural hx of the dz in every individual patient, which may well vary over time.  For now therefore I recommend the patient maintain  10 a/w 5 but critical she also use the dulera consistently so as not to confuse response  I had an extended discussion with the patient reviewing all relevant studies completed to date and  lasting 15 to 20 minutes of a 25 minute visit    Each maintenance medication was reviewed in detail including most importantly the difference between maintenance and prns and under what circumstances the prns are to be triggered using an action plan format that is not reflected in the computer generated alphabetically organized AVS.    Please see instructions for details which were reviewed in writing and the patient given a copy highlighting the part that I personally wrote and discussed at today's ov.

## 2016-06-01 NOTE — Progress Notes (Signed)
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  Since then waxing/waning sx requiring intermittent prednisone tapers with only minimal improvement - most of her symptoms chronically have been related to obesity with dyspnea on exertion and tendency to chronic cough which is felt to be at least partly  reflux related but improved with saba 03/09/2015 so started on dulera 100 2bid in addition to maint pred and seems to have helped but never able to get < 10 mg pred s flare cough/sob     History of Present Illness  November 07, 2010 ov cc cough/ strangling  a bit more with taper off reglan (actually worse before ran out of the low dose reglan)  no excess mucus or increase sob on prednisone @  10 mg daily .  Using med calendar well rec 1)  Ok to resume water aerobics but pace yourself 2)  Wait until your swallowing evaulation is complete before making a decision re reglan > stopped it on her own.  3)  Try Prednisone 10 mg one-half each am   04/04/2012 f/u ov/Elion Hocker no longer using med calendar on Pred 10 mg one daily  added hyzaar to rx for hbp/leg swelling not back to mall walking yet,  No unusual cough, purulent sputum or sinus/hb symptoms on present rx. No variability to symptoms or need for inhaler. Rec Try prednisone 10 mg one half daily to see if affects your ability to walk the mall on 02 or the cough gets worse (walk the mall first for at least before you try) Always walk for exercise on 3lpm - this will help you burn fat and get into negative calorie balance to lose weight.   03/17/2013 f/u ov/Belvia Gotschall re NSIP on floor of 10 mg per day Chief Complaint  Patient presents with  . Follow-up    Breathing is overall doing well and she denies any new co's today.   ex on a treadmill x 25m x 3 x daily on 2lpm not the 3lpm as rec >>Prednisone ceiling is  20 mg per day and floor is 10 mg per day        10/06/2014 f/u ov/Lyndal Alamillo re: pred 10 mg daily after trying 10/5 until Tgiving and worse cough/ sob  Chief Complaint  Patient presents with  . Follow-up    F/U ILD; breathing doing fine; no complaints  Not doing treadmill  Any more but Not limited by breathing from desired activities   rec Resume treadmill x 30 min daily minimum If  doing great try to reduce prednisone to 10 mg alternating with 5 mg daily as your new floor but increase back to ceiling of 20 mg per day if needed     03/09/2015 f/u ov/Jlynn Langille re: NSIP  pred 10 mg daily could not tol 10/5  Chief Complaint  Patient presents with  . Follow-up    PFT done today. Pt states her breathing is overall doing well, unless she gets exposed to hot/humid weather. She is taking 10 mg pred daily.   2lpm sleep/ 3 lpm ex , flare of cough with < 10 mg per day pred, better cough p albuterol  rec Work on inhaler technique:    Try dulera 100 Take 2 puffs first thing in am and then another 2 puffs  about 12 hours later> helped some     10/21/2015  f/u ov/Dyson Sevey re: NSIP/ 02 at hs and with ex pred at 10 mg daily  Chief Complaint  Patient presents with  . Follow-up    Pt states that her breathing is doing well. She denies any co's today.   lots of stress at home related to husband's accident and not following diet/ ex plans but trying to get back into it and requesting poc. rec Please see patient coordinator before you leave today  to schedule [facility] eval for portable 02  Keep working on diet / exercise   02/06/2016  f/u ov/Keala Drum re: NSIP / 3lpm with ex/ duler 100 2bid and pred 10 mg daily  Chief Complaint  Patient presents with  . Follow-up    Pt states that she did have some issues with allergies but is improving. Pt also was having issues with Dulera, jittery after use, but has started rinsing her mouth after use and side effects have improved. Pt denies cough/wheeze/SOB/CP/tightness.   limited  by L knee from doing treadmill, has trainer but not losing wt  Rhinitis symptoms better on singulair   rec Work on inhaler technique:    Should be able to tolerate dulera 100 Take 2 puffs first thing in am and then another 2 puffs about 12 hours later.  If so, try prednisone 10 - 5  = even days take a half, odd days take half   06/01/2016  f/u ov/Sarahmarie Leavey re: NSIP  Chief Complaint  Patient presents with  . Follow-up    Breathing is doing well. She denies any new co's today.   when ex 3lpm and back on treadmill x 25 min more limited by knee 2-3x per week at 14mph flat / minimal dry daytime cough on nexium 20 mg bid ac     No obvious day to day or daytime variabilty or assoc excess/ purulent sputum or mucus plugs   cp or chest tightness, subjective wheeze overt sinus or hb symptoms. No unusual exp hx or h/o childhood pna/ asthma or knowledge of premature birth.  Sleeping ok on 02 2lpm without nocturnal  or early am exacerbation  of respiratory  c/o's or need for noct saba. Also denies any obvious fluctuation of symptoms with weather or environmental changes or other aggravating or alleviating factors except as outlined above   Current Medications, Allergies, Complete Past Medical History, Past Surgical History, Family History, and Social History were reviewed in [employer] record.  ROS  The following are not active complaints unless bolded sore throat, dysphagia, dental problems, itching, sneezing,  nasal congestion or excess/ purulent secretions, ear ache,   fever, chills, sweats, unintended wt loss, pleuritic or exertional cp, hemoptysis,  orthopnea pnd or leg swelling, presyncope, palpitations, heartburn, abdominal pain, anorexia, nausea, vomiting, diarrhea  or change in bowel or urinary habits, change in stools or urine, dysuria,hematuria,  rash, arthralgias L knee, visual complaints, headache, numbness weakness or ataxia or problems with walking or coordination,  change in  mood/affect or memory.             Past Medical History: GASTROESOPHAGEAL REFLUX DISEASE     - Tapered reglan to 10 mg one half twice daily June 22, 2010 > d/c'd 11/2010  -restarted reglan 03/2011 >>unable to tolerate.  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)   -Restart Prednisone 02/24/08   -PFT's December 23, 2008 VC 35%  DLC0 31%   -  Desats with > 2 laps 06/22/10 so rec wear 02 2lpm at bedtime and with ex  MORBID OBESITY   - Target wt  =  153  for BMI < 30  Hypertension Health Maintenance..........................................................Marland KitchenMarland KitchenAsa Lente    - Pneumovax July 22, 2009      - Flu 06/21/2011  Complex med regimen 06/21/2011 , 06/15/2013         Objective:   Physical Exam  Massively obese pleasant amb bf nad  Vital signs reviewed - note 99% on RA 06/01/2016     wt  305 May 28, 2008 >   302 November 07, 2010 >   303 03/22/2011 > 10/25/2011  299 > 311 04/04/2012 > 306 03/17/2013 >06/15/2013 303 > 10/06/2013 298  > 02/08/2014 289 > 10/06/2014   288 > 03/09/2015 283 > 10/21/2015    283 >  02/06/2016  284 > 06/01/2016  282   HEENT: nl dentition, turbinates, and orophanx. Nl external ear canals without cough reflex Neck without JVD/Nodes/TM Lungs distant bs,  Distant BS w/ no wheezing / no cough on insp RRR no s3 or murmur or increase in P2. no edema Abd soft and benign with nl excursion in the supine position. Ext warm without calf tenderness, cyanosis clubbing        Assessment & Plan:

## 2016-06-01 NOTE — Assessment & Plan Note (Signed)
-  PFTs.  03/09/2015  14% better p B2 and less cough p saba - 03/09/2015   > try dulera 100 2bid    - 06/01/2016  After extensive coaching HFA effectiveness =    75%   Despite suboptimal hfa All goals of chronic asthma control met including optimal function and elimination of symptoms with minimal need for rescue therapy.  Contingencies discussed in full including contacting this office immediately if not controlling the symptoms using the rule of two's.

## 2016-06-01 NOTE — Patient Instructions (Signed)
Work on inhaler technique:  relax and gently blow all the way out then take a nice smooth deep breath back in, triggering the inhaler at same time you start breathing in.  Hold for up to 5 seconds if you can. Blow out thru nose. Rinse and gargle with water when done  Continue  dulera 100 Take 2 puffs first thing in am and then another 2 puffs about 12 hours later.     If so, try prednisone 10 - 5  = even days take a half, odd days take half  Weight control is simply a matter of calorie balance which needs to be tilted in your favor by eating less and exercising more.  To get the most out of exercise, you need to be continuously aware that you are short of breath, but never out of breath, for 30 minutes daily. As you improve, it will actually be easier for you to do the same amount of exercise  in  30 minutes so always push to the level where you are short of breath.  If this does not result in gradual weight reduction then I strongly recommend you see a nutritionist with a food diary x 2 weeks so that we can work out a negative calorie balance which is universally effective in steady weight loss programs.  Think of your calorie balance like you do your bank account where in this case you want the balance to go down so you must take in less calories than you burn up.  It's just that simple:  Hard to do, but easy to understand.  Good luck!   Please schedule a follow up visit in 3 months but call sooner if needed

## 2016-06-01 NOTE — Assessment & Plan Note (Signed)
-   Started on 02 06/22/10   - 04/05/2012  Walked 2lpm x 3 laps @ 185 ft each stopped due to  desat corrected on 3lpm so rec 3lpm with exertion  - 10/06/2013  Walked RA x 2 laps @ 185 ft each stopped due to  Cough and desat to 86% at more rapid pace than usual and on 3lpm could do 3 laps s difficulty     rx  As of 06/01/2016 = no 02 at rest, 3lpm with exertion, 2lpm at hs

## 2016-06-20 ENCOUNTER — Other Ambulatory Visit: Payer: Self-pay | Admitting: Emergency Medicine

## 2016-06-20 MED ORDER — GLUCOSE BLOOD VI STRP: ORAL_STRIP | 4 refills | Status: DC

## 2016-06-27 ENCOUNTER — Other Ambulatory Visit: Payer: Self-pay | Admitting: *Deleted

## 2016-06-27 MED ORDER — INSULIN DETEMIR 100 UNIT/ML FLEXPEN: 30.0000 [IU] | PEN_INJECTOR | Freq: Every day | SUBCUTANEOUS | 5 refills | Status: DC

## 2016-06-27 NOTE — Telephone Encounter (Signed)
Rec'd fax stating pt states the directions has change to 30 units daily on her Levemir. Needing updated script sent for 30 days w/refills. Sent new script to [facility]...[name]

## 2016-07-23 ENCOUNTER — Encounter: Payer: Self-pay | Admitting: Nurse Practitioner

## 2016-07-23 ENCOUNTER — Ambulatory Visit (INDEPENDENT_AMBULATORY_CARE_PROVIDER_SITE_OTHER): Payer: BC Managed Care – PPO | Admitting: Nurse Practitioner

## 2016-07-23 VITALS — BP 128/72 | HR 94 | Temp 97.7°F | Ht 60.0 in | Wt 282.0 lb

## 2016-07-23 DIAGNOSIS — J45991 Cough variant asthma: Secondary | ICD-10-CM | POA: Diagnosis not present

## 2016-07-23 DIAGNOSIS — J0100 Acute maxillary sinusitis, unspecified: Secondary | ICD-10-CM | POA: Diagnosis not present

## 2016-07-23 MED ORDER — IPRATROPIUM-ALBUTEROL 0.5-2.5 (3) MG/3ML IN SOLN
3.0000 mL | Freq: Once | RESPIRATORY_TRACT | Status: AC
  Administered 2016-07-23: 3 mL via RESPIRATORY_TRACT

## 2016-07-23 MED ORDER — METHYLPREDNISOLONE ACETATE 40 MG/ML IJ SUSP
40.0000 mg | Freq: Once | INTRAMUSCULAR | Status: AC
  Administered 2016-07-23: 40 mg via INTRAMUSCULAR

## 2016-07-23 MED ORDER — HYDROCODONE-HOMATROPINE 5-1.5 MG/5ML PO SYRP: 5.0000 mL | ORAL_SOLUTION | Freq: Every evening | ORAL | 0 refills | Status: DC | PRN

## 2016-07-23 MED ORDER — IPRATROPIUM BROMIDE 0.03 % NA SOLN: 2.0000 | Freq: Two times a day (BID) | NASAL | 0 refills | Status: DC

## 2016-07-23 MED ORDER — AZITHROMYCIN 250 MG PO TABS: 250.0000 mg | ORAL_TABLET | Freq: Every day | ORAL | 0 refills | Status: DC

## 2016-07-23 NOTE — Progress Notes (Signed)
Pre visit review using our clinic review tool, if applicable. No additional management support is needed unless otherwise documented below in the visit note. 

## 2016-07-23 NOTE — Patient Instructions (Signed)
URI Instructions: Use over-the-counter  "cold" medicines  such as "Tylenol cold" , "Advil cold",  "Mucinex" or" Mucinex D"  for cough and congestion.  Avoid decongestants if you have high blood pressure. Use" Delsym" or" Robitussin" cough syrup varietis for cough.  You can use plain "Tylenol" or "Advi"l for fever, chills and achyness. Encourage adequate oral hydration.  "Common cold" symptoms are usually triggered by a virus.  The antibiotics are usually not necessary. On average, a" viral cold" illness would take 4-7 days to resolve. Please, make an appointment if you are not better or if you're worse.

## 2016-07-23 NOTE — Progress Notes (Signed)
Subjective:  Patient ID: [name], female    DOB: 1971-02-05  Age: 45 y.o. MRN: [id number]  CC: Cough (Pt stated having coughing with yellow mucus, sinus pressure, ears are itching for about 1 week)   Sinusitis  This is a new problem. The current episode started in the past 7 days. The problem has been gradually worsening since onset. There has been no fever. Her pain is at a severity of 8/10. The pain is severe. Associated symptoms include chills, congestion, coughing, ear pain, headaches, a hoarse voice, sinus pressure, sneezing, a sore throat and swollen glands. Pertinent negatives include no shortness of breath. Past treatments include oral decongestants. The treatment provided mild relief.  did not use dulera as prescribed.  Outpatient Medications Prior to Visit  Medication Sig Dispense Refill  . Ascorbic Acid (VITAMIN C PO) Take by mouth daily.    Marland Kitchen aspirin EC 81 MG tablet Take 1 tablet (81 mg total) by mouth daily. 150 tablet 2  . atorvastatin (LIPITOR) 20 MG tablet TAKE 1 TABLET (20 MG TOTAL) BY MOUTH DAILY AT 6 PM. 90 tablet 2  . B-D ULTRAFINE III SHORT PEN 31G X 8 MM MISC USE NEEDLE AS NEEDED TO INJECTED INSULIN AS PRESCRIBED BY PHYSICIAN 100 each 2  . Blood Glucose Monitoring Suppl (ONE TOUCH ULTRA MINI) W/DEVICE KIT Use as directed to check blood sugar twice a day. Dx 250.00 1 each 0  . Cholecalciferol (VITAMIN D) 2000 units CAPS Take 1 capsule by mouth daily.    . Diclofenac Sodium 2 % SOLN Apply 1 pump twice daily as needed. 112 g 3  . esomeprazole (NEXIUM) 20 MG capsule Take 1 capsule (20 mg total) by mouth 2 (two) times daily before a meal. Take 1 cap by mouth 30-60 minutes before first meal of the day    . glucose blood (ONE TOUCH ULTRA TEST) test strip Use to check blood sugars twice a day. 100 each 4  . hydrochlorothiazide (MICROZIDE) 12.5 MG capsule Take 1 capsule (12.5 mg total) by mouth daily as needed. 30 capsule 2  . Insulin Detemir (LEVEMIR FLEXPEN) 100 UNIT/ML  Pen Inject 30 Units into the skin daily. 15 mL 5  . IRON PO Take 65 mg by mouth daily.    Marland Kitchen loratadine (CLARITIN) 10 MG tablet Take 10 mg by mouth daily as needed (nasal drainage, drip).    Marland Kitchen losartan (COZAAR) 100 MG tablet Take 1 tablet (100 mg total) by mouth daily. 90 tablet 2  . meloxicam (MOBIC) 15 MG tablet Take 1 tablet (15 mg total) by mouth daily. TAKE WITH MEALS 30 tablet 0  . mometasone-formoterol (DULERA) 100-5 MCG/ACT AERO Take 2 puffs first thing in am and then another 2 puffs about 12 hours later. 1 Inhaler 11  . montelukast (SINGULAIR) 10 MG tablet Take 1 tablet (10 mg total) by mouth at bedtime. 30 tablet 5  . ONE TOUCH LANCETS MISC Use as directed to help check blood sugars twice daily. Dx 250.00 60 each 5  . oxybutynin (DITROPAN XL) 10 MG 24 hr tablet Take 1 tablet (10 mg total) by mouth at bedtime. 90 tablet 1  . OXYGEN-HELIUM IN Inhale 2-3 L into the lungs daily.     Marland Kitchen oxymetazoline (AFRIN) 0.05 % nasal spray 2 puffs twice daily as needed for 5 days (before Fluticasone)    . predniSONE (DELTASONE) 10 MG tablet TAKE 1 TABLET BY MOUTH DAILY 90 tablet 1  . SitaGLIPtin-MetFORMIN HCl (JANUMET XR) [phone] MG TB24  Take 1 tablet by mouth daily. 90 tablet 2  . TURMERIC PO Take by mouth daily.     No facility-administered medications prior to visit.     ROS See HPI  Objective:  BP 128/72 (BP Location: Left Arm, Patient Position: Sitting, Cuff Size: Large)   Pulse 94   Temp 97.7 F (36.5 C)   Ht 5' (1.524 m)   Wt 282 lb (127.9 kg)   SpO2 99%   BMI 55.07 kg/m   BP Readings from Last 3 Encounters:  07/23/16 128/72  06/01/16 130/90  05/16/16 124/88    Wt Readings from Last 3 Encounters:  07/23/16 282 lb (127.9 kg)  06/01/16 282 lb (127.9 kg)  05/16/16 282 lb (127.9 kg)    Physical Exam  Constitutional: She is oriented to person, place, and time. No distress.  HENT:  Right Ear: Tympanic membrane, external ear and ear canal normal.  Left Ear: Tympanic membrane,  external ear and ear canal normal.  Nose: Mucosal edema and rhinorrhea present. Right sinus exhibits maxillary sinus tenderness. Left sinus exhibits maxillary sinus tenderness.  Mouth/Throat: Uvula is midline. Posterior oropharyngeal erythema present. No oropharyngeal exudate.  Eyes: No scleral icterus.  Cardiovascular: Normal rate and normal heart sounds.   Pulmonary/Chest: Effort normal. No respiratory distress. She has wheezes.  Musculoskeletal: She exhibits no edema.  Lymphadenopathy:    She has cervical adenopathy.  Neurological: She is alert and oriented to person, place, and time.  Skin: Skin is warm and dry.  Vitals reviewed.   Lab Results  Component Value Date   WBC 10.1 09/19/2015   HGB 13.1 09/19/2015   HCT 41.3 09/19/2015   PLT 243.0 09/19/2015   GLUCOSE 234 (H) 05/16/2016   CHOL 160 09/19/2015   TRIG 89.0 09/19/2015   HDL 38.70 (L) 09/19/2015   LDLDIRECT 153.1 12/08/2012   LDLCALC 104 (H) 09/19/2015   ALT 16 05/16/2016   AST 11 05/16/2016   NA 135 05/16/2016   K 4.0 05/16/2016   CL 99 05/16/2016   CREATININE 0.71 05/16/2016   BUN 12 05/16/2016   CO2 30 05/16/2016   TSH 1.01 09/19/2015   HGBA1C 10.0 (H) 05/16/2016   MICROALBUR 1.1 05/16/2016    [place] Extrem Low Left Ltd  Result Date: 03/08/2016 [facility] US performed of: Left knee This study was ordered, performed, and interpreted by Charlann Boxer D.O. Knee: Difficult scan secondary to patient's body habitus. Anterior medial meniscus does have some mild displacement and degenerative changes noted with increasing Doppler flow. 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 Seems chronic. Patient also has some mild hypoechoic changes within the popliteal tendon sheath. 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 EXAM: LEFT KNEE - COMPLETE 4+ VIEW COMPARISON:  None. FINDINGS: There is no evidence of acute fracture, subluxation, dislocation or joint effusion. Mild tricompartmental joint space narrowing and osteophytosis noted. No focal bony lesions are identified. IMPRESSION: No evidence of acute abnormality. Mild tricompartmental degenerative changes. Electronically Signed   By: Margarette Canada M.D.   On: 03/06/2016 10:04    Assessment & Plan:   Bali was seen today for cough.  Diagnoses and all orders for this visit:  Acute non-recurrent maxillary sinusitis -  methylPREDNISolone acetate (DEPO-MEDROL) injection 40 mg; Inject 1 mL (40 mg total) into the muscle once. -     azithromycin (ZITHROMAX Z-PAK) 250 MG tablet; Take 1 tablet (250 mg total) by mouth daily. Take 2tabs on first day, then 1tab once a day till complete -     ipratropium (ATROVENT) 0.03 % nasal spray; Place 2 sprays into both nostrils 2 (two) times daily. Do not use for more than 5days.  Cough variant asthma -     methylPREDNISolone acetate (DEPO-MEDROL) injection 40 mg; Inject 1 mL (40 mg total) into the muscle once. -     HYDROcodone-homatropine (HYCODAN) 5-1.5 MG/5ML syrup; Take 5 mLs by mouth at bedtime as needed for cough. -     ipratropium-albuterol (DUONEB) 0.5-2.5 (3) MG/3ML nebulizer solution 3 mL; Take 3 mLs by nebulization once.   I am having Ms. Benzel start on HYDROcodone-homatropine, azithromycin, and ipratropium. I am also having her maintain her ONE TOUCH LANCETS, ONE TOUCH ULTRA MINI, loratadine, oxymetazoline, OXYGEN-HELIUM IN, mometasone-formoterol, B-D ULTRAFINE III SHORT PEN, losartan, SitaGLIPtin-MetFORMIN HCl, hydrochlorothiazide, oxybutynin, aspirin EC, atorvastatin, montelukast, meloxicam, Diclofenac Sodium, predniSONE, IRON PO, TURMERIC PO, Ascorbic Acid (VITAMIN C PO), Vitamin D,  esomeprazole, glucose blood, and Insulin Detemir. We will continue to administer methylPREDNISolone acetate and ipratropium-albuterol.  Meds ordered this encounter  Medications  . methylPREDNISolone acetate (DEPO-MEDROL) injection 40 mg  . HYDROcodone-homatropine (HYCODAN) 5-1.5 MG/5ML syrup    Sig: Take 5 mLs by mouth at bedtime as needed for cough.    Dispense:  120 mL    Refill:  0    Order Specific Question:   Supervising Provider    Answer:   Cassandria Anger [[id number]]  . azithromycin (ZITHROMAX Z-PAK) 250 MG tablet    Sig: Take 1 tablet (250 mg total) by mouth daily. Take 2tabs on first day, then 1tab once a day till complete    Dispense:  6 tablet    Refill:  0    Order Specific Question:   Supervising Provider    Answer:   Cassandria Anger [[id number]]  . ipratropium (ATROVENT) 0.03 % nasal spray    Sig: Place 2 sprays into both nostrils 2 (two) times daily. Do not use for more than 5days.    Dispense:  30 mL    Refill:  0    Order Specific Question:   Supervising Provider    Answer:   Cassandria Anger [[id number]]  . ipratropium-albuterol (DUONEB) 0.5-2.5 (3) MG/3ML nebulizer solution 3 mL    Follow-up: Return if symptoms worsen or fail to improve.  Wilfred Lacy, NP

## 2016-08-13 ENCOUNTER — Emergency Department (HOSPITAL_COMMUNITY)
Admission: EM | Admit: 2016-08-13 | Discharge: 2016-08-13 | Disposition: A | Discharge status: Home / Self Care | Payer: BC Managed Care – PPO | Attending: Emergency Medicine | Admitting: Emergency Medicine

## 2016-08-13 ENCOUNTER — Emergency Department (HOSPITAL_COMMUNITY): Payer: BC Managed Care – PPO

## 2016-08-13 ENCOUNTER — Encounter (HOSPITAL_COMMUNITY): Payer: Self-pay | Admitting: Emergency Medicine

## 2016-08-13 DIAGNOSIS — E119 Type 2 diabetes mellitus without complications: Secondary | ICD-10-CM | POA: Diagnosis not present

## 2016-08-13 DIAGNOSIS — Y929 Unspecified place or not applicable: Secondary | ICD-10-CM | POA: Insufficient documentation

## 2016-08-13 DIAGNOSIS — Z794 Long term (current) use of insulin: Secondary | ICD-10-CM | POA: Diagnosis not present

## 2016-08-13 DIAGNOSIS — Z79899 Other long term (current) drug therapy: Secondary | ICD-10-CM | POA: Diagnosis not present

## 2016-08-13 DIAGNOSIS — S99921A Unspecified injury of right foot, initial encounter: Secondary | ICD-10-CM

## 2016-08-13 DIAGNOSIS — W208XXA Other cause of strike by thrown, projected or falling object, initial encounter: Secondary | ICD-10-CM | POA: Insufficient documentation

## 2016-08-13 DIAGNOSIS — Z7984 Long term (current) use of oral hypoglycemic drugs: Secondary | ICD-10-CM | POA: Insufficient documentation

## 2016-08-13 DIAGNOSIS — Y999 Unspecified external cause status: Secondary | ICD-10-CM | POA: Diagnosis not present

## 2016-08-13 DIAGNOSIS — Z7982 Long term (current) use of aspirin: Secondary | ICD-10-CM | POA: Insufficient documentation

## 2016-08-13 DIAGNOSIS — Y939 Activity, unspecified: Secondary | ICD-10-CM | POA: Diagnosis not present

## 2016-08-13 DIAGNOSIS — I10 Essential (primary) hypertension: Secondary | ICD-10-CM | POA: Diagnosis not present

## 2016-08-13 MED ORDER — IBUPROFEN 200 MG PO TABS
600.0000 mg | ORAL_TABLET | Freq: Once | ORAL | Status: AC
  Administered 2016-08-13: 600 mg via ORAL
  Filled 2016-08-13: qty 3

## 2016-08-13 NOTE — ED Triage Notes (Signed)
Cane fell on pt's R great toe this am. Pain with movement. Ambulatory to room.

## 2016-08-13 NOTE — Discharge Instructions (Signed)
There were no acute abnormalities on the x-ray. Keep the foot elevated whenever possible. Apply ice to reduce inflammation. May use ibuprofen or naproxen for a short time to reduce pain and inflammation. Follow up with your PCP for continued management of this issue.

## 2016-08-13 NOTE — ED Provider Notes (Signed)
[facility] DEPT Provider Note   CSN: [id number] Arrival date & time: 08/13/16  0719     History   Chief Complaint Chief Complaint  Patient presents with  . Toe Injury    HPI [name] is a 45 y.o. female.  HPI   [name] is a 45 y.o. female, with a history of DM and morbid obesity, presenting to the ED with a right big toe injury that occurred a couple hours prior to arrival. She states that her walking cane fell on her foot this morning. Pain is mild to moderate, throbbing, nonradiating. Patient has not taken medications for this issue. Patient denies neuro deficits, falls, or any other injuries or complaints.    Past Medical History:  Diagnosis Date  . Allergic rhinitis   . Bronchitis   . Chronic rhinitis   . Cough   . Dyslipidemia   . Dysphagia   . GERD (gastroesophageal reflux disease)   . Headache(784.0)   . ILD (interstitial lung disease) ([place])   . Mild depression ([place])   . Morbid obesity ([place])   . Type II or diabetes mellitus, uncontrolled 11/2012 dx   Dx 12/08/12: a1c 10.0  . Unspecified essential hypertension   . Urge incontinence     Patient Active Problem List   Diagnosis Date Noted  . Knee pain, left 02/02/2016  . Cough variant asthma 03/09/2015  . Diabetes type 2, uncontrolled ([place]) 12/09/2012  . Dyslipidemia   . Chronic respiratory failure with hypoxia ([place]) 04/05/2012  . CONJUNCTIVITIS, ALLERGIC, SEASONAL 01/05/2010  . Allergic rhinitis 01/05/2010  . Essential hypertension 09/07/2009  . MUSCLE SPASM 09/07/2009  . DEPRESSION, MILD 03/24/2009  . HEADACHE 03/24/2009  . URINARY INCONTINENCE, URGE 03/24/2009  . CHRONIC RHINITIS 12/23/2008  . Severe obesity (BMI >= 40) ([place]) 10/10/2007  . INTERSTITIAL LUNG DISEASE 10/10/2007  . GASTROESOPHAGEAL REFLUX DISEASE 10/10/2007    Past Surgical History:  Procedure Laterality Date  . BILATERAL VATS ABLATION  02/15/04    OB History    No data available       Home Medications    Prior  to Admission medications   Medication Sig Start Date End Date Taking? Authorizing Provider  Ascorbic Acid (VITAMIN C PO) Take by mouth daily.    Historical Provider, MD  aspirin EC 81 MG tablet Take 1 tablet (81 mg total) by mouth daily. 09/19/15   Rowe Clack, MD  atorvastatin (LIPITOR) 20 MG tablet TAKE 1 TABLET (20 MG TOTAL) BY MOUTH DAILY AT 6 PM. 11/30/15   Rowe Clack, MD  azithromycin (ZITHROMAX Z-PAK) 250 MG tablet Take 1 tablet (250 mg total) by mouth daily. Take 2tabs on first day, then 1tab once a day till complete 07/23/16   Flossie Buffy, NP  B-D ULTRAFINE III SHORT PEN 31G X 8 MM MISC USE NEEDLE AS NEEDED TO INJECTED INSULIN AS PRESCRIBED BY PHYSICIAN 05/26/15   Rowe Clack, MD  Blood Glucose Monitoring Suppl (ONE TOUCH ULTRA MINI) W/DEVICE KIT Use as directed to check blood sugar twice a day. Dx 250.00 12/11/12   Rowe Clack, MD  Cholecalciferol (VITAMIN D) 2000 units CAPS Take 1 capsule by mouth daily.    Historical Provider, MD  Diclofenac Sodium 2 % SOLN Apply 1 pump twice daily as needed. 03/06/16   Lyndal Pulley, DO  esomeprazole (NEXIUM) 20 MG capsule Take 1 capsule (20 mg total) by mouth 2 (two) times daily before a meal. Take 1 cap by mouth 30-60  minutes before first meal of the day 06/01/16   Tanda Rockers, MD  glucose blood (ONE TOUCH ULTRA TEST) test strip Use to check blood sugars twice a day. 06/20/16   Binnie Rail, MD  hydrochlorothiazide (MICROZIDE) 12.5 MG capsule Take 1 capsule (12.5 mg total) by mouth daily as needed. 09/19/15   Rowe Clack, MD  HYDROcodone-homatropine [facility]) 5-1.5 MG/5ML syrup Take 5 mLs by mouth at bedtime as needed for cough. 07/23/16   Flossie Buffy, NP  Insulin Detemir (LEVEMIR FLEXPEN) 100 UNIT/ML Pen Inject 30 Units into the skin daily. 06/27/16   Binnie Rail, MD  ipratropium (ATROVENT) 0.03 % nasal spray Place 2 sprays into both nostrils 2 (two) times daily. Do not use for more than 5days. 07/23/16    Charlene Brooke Nche, NP  IRON PO Take 65 mg by mouth daily.    Historical Provider, MD  loratadine (CLARITIN) 10 MG tablet Take 10 mg by mouth daily as needed (nasal drainage, drip).    Historical Provider, MD  losartan (COZAAR) 100 MG tablet Take 1 tablet (100 mg total) by mouth daily. 09/19/15   Rowe Clack, MD  meloxicam (MOBIC) 15 MG tablet Take 1 tablet (15 mg total) by mouth daily. TAKE WITH MEALS 02/05/16   Mercedes Camprubi-Soms, PA-C  mometasone-formoterol (DULERA) 100-5 MCG/ACT AERO Take 2 puffs first thing in am and then another 2 puffs about 12 hours later. 03/09/15   Tanda Rockers, MD  montelukast (SINGULAIR) 10 MG tablet Take 1 tablet (10 mg total) by mouth at bedtime. 02/02/16   Binnie Rail, MD  ONE TOUCH LANCETS MISC Use as directed to help check blood sugars twice daily. Dx 250.00 12/10/12   Rowe Clack, MD  oxybutynin (DITROPAN XL) 10 MG 24 hr tablet Take 1 tablet (10 mg total) by mouth at bedtime. 09/19/15   Rowe Clack, MD  OXYGEN-HELIUM IN Inhale 2-3 L into the lungs daily.     Historical Provider, MD  oxymetazoline (AFRIN) 0.05 % nasal spray 2 puffs twice daily as needed for 5 days (before Fluticasone)    Historical Provider, MD  predniSONE (DELTASONE) 10 MG tablet TAKE 1 TABLET BY MOUTH DAILY 03/20/16   Tanda Rockers, MD  SitaGLIPtin-MetFORMIN HCl (JANUMET XR) [phone] MG TB24 Take 1 tablet by mouth daily. 09/19/15   Rowe Clack, MD  TURMERIC PO Take by mouth daily.    Historical Provider, MD    Family History Family History  Problem Relation Age of Onset  . Sarcoidosis Mother     dxed by transbrochial bx  . Allergies Mother   . Heart disease Father   . Diabetes Father   . Allergies Father   . Coronary artery disease Father   . Cancer Maternal Grandmother     CA of unknown type  . Cancer Paternal Grandmother     CA of unknown type    Social History Social History  Substance Use Topics  . Smoking status: Never Smoker  . Smokeless  tobacco: Never Used     Comment: {  . Alcohol use No     Allergies   Penicillins   Review of Systems Review of Systems  Musculoskeletal: Positive for arthralgias.  Skin: Negative for wound.  Neurological: Negative for weakness and numbness.     Physical Exam Updated Vital Signs BP 124/76   Pulse 90   Temp 99.4 F (37.4 C) (Oral)   Resp 20   SpO2 96%  Physical Exam  Constitutional: She appears well-developed and well-nourished. No distress.  HENT:  Head: Normocephalic and atraumatic.  Eyes: Conjunctivae are normal.  Neck: Neck supple.  Cardiovascular: Normal rate and regular rhythm.   Pulmonary/Chest: Effort normal.  Musculoskeletal:  Tenderness and swelling noted to the right first MTP joint. Motor function intact in the right toes and foot. No ankle pain or tenderness.  Neurological: She is alert.  No sensory deficits in the toes of the right foot. Strength with flexion and extension of the toes is 5 out of 5.  Skin: Skin is warm and dry. She is not diaphoretic.  Psychiatric: She has a normal mood and affect. Her behavior is normal.  Nursing note and vitals reviewed.    ED Treatments / Results  Labs (all labs ordered are listed, but only abnormal results are displayed) Labs Reviewed - No data to display  EKG  EKG Interpretation None       Radiology Dg Foot Complete Right  Result Date: 08/13/2016 CLINICAL DATA:  Direct trauma to the great toe this morning after a can fell upon it. EXAM: RIGHT FOOT COMPLETE - 3+ VIEW COMPARISON:  None in PACs FINDINGS: The bones is subjectively adequately mineralized. No acute fracture or dislocation of the great toe is observed. The interphalangeal and MTP joint of the great toe are normal. The other phalanges are intact. The metatarsals are intact. The bones of the hindfoot exhibit no acute abnormalities. There is a plantar and smaller Achilles region calcaneal spur. IMPRESSION: No acute fracture of the great toe is  observed. 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 Electronically Signed   By: David  Martinique M.D.   On: 08/13/2016 07:47    Procedures Procedures (including critical care time)  Medications Ordered in ED Medications  ibuprofen (ADVIL,MOTRIN) tablet 600 mg (600 mg Oral Given 08/13/16 0757)     Initial Impression / Assessment and Plan / ED Course  I have reviewed the triage vital signs and the nursing notes.  Pertinent labs & imaging results that were available during my care of the patient were reviewed by me and considered in my medical decision making (see chart for details).  Clinical Course     Patient presents with right great toe injury that occurred this morning. No acute abnormalities on x-ray. Symptomatic care discussed. PCP follow-up.     Final Clinical Impressions(s) / ED Diagnoses   Final diagnoses:  Injury of toe on right foot, initial encounter    New Prescriptions New Prescriptions   No medications on file     Layla Maw 08/13/16 [place], MD 08/14/16 [phone]

## 2016-08-13 NOTE — Progress Notes (Signed)
Subjective:    Patient ID: [name], female    DOB: [birth year], 45 y.o.   MRN: [id number]  HPI No show   Medications and allergies reviewed with patient and updated if appropriate.  Patient Active Problem List   Diagnosis Date Noted  . Knee pain, left 02/02/2016  . Cough variant asthma 03/09/2015  . Diabetes type 2, uncontrolled ([place]) 12/09/2012  . Dyslipidemia   . Chronic respiratory failure with hypoxia ([place]) 04/05/2012  . CONJUNCTIVITIS, ALLERGIC, SEASONAL 01/05/2010  . Allergic rhinitis 01/05/2010  . Essential hypertension 09/07/2009  . MUSCLE SPASM 09/07/2009  . DEPRESSION, MILD 03/24/2009  . HEADACHE 03/24/2009  . URINARY INCONTINENCE, URGE 03/24/2009  . CHRONIC RHINITIS 12/23/2008  . Severe obesity (BMI >= 40) ([place]) 10/10/2007  . INTERSTITIAL LUNG DISEASE 10/10/2007  . GASTROESOPHAGEAL REFLUX DISEASE 10/10/2007    Current Outpatient Prescriptions on File Prior to Visit  Medication Sig Dispense Refill  . Ascorbic Acid (VITAMIN C PO) Take by mouth daily.    Marland Kitchen aspirin EC 81 MG tablet Take 1 tablet (81 mg total) by mouth daily. 150 tablet 2  . atorvastatin (LIPITOR) 20 MG tablet TAKE 1 TABLET (20 MG TOTAL) BY MOUTH DAILY AT 6 PM. 90 tablet 2  . azithromycin (ZITHROMAX Z-PAK) 250 MG tablet Take 1 tablet (250 mg total) by mouth daily. Take 2tabs on first day, then 1tab once a day till complete 6 tablet 0  . B-D ULTRAFINE III SHORT PEN 31G X 8 MM MISC USE NEEDLE AS NEEDED TO INJECTED INSULIN AS PRESCRIBED BY PHYSICIAN 100 each 2  . Blood Glucose Monitoring Suppl (ONE TOUCH ULTRA MINI) W/DEVICE KIT Use as directed to check blood sugar twice a day. Dx 250.00 1 each 0  . Cholecalciferol (VITAMIN D) 2000 units CAPS Take 1 capsule by mouth daily.    . Diclofenac Sodium 2 % SOLN Apply 1 pump twice daily as needed. 112 g 3  . esomeprazole (NEXIUM) 20 MG capsule Take 1 capsule (20 mg total) by mouth 2 (two) times daily before a meal. Take 1 cap by mouth 30-60 minutes before  first meal of the day    . glucose blood (ONE TOUCH ULTRA TEST) test strip Use to check blood sugars twice a day. 100 each 4  . hydrochlorothiazide (MICROZIDE) 12.5 MG capsule Take 1 capsule (12.5 mg total) by mouth daily as needed. 30 capsule 2  . HYDROcodone-homatropine (HYCODAN) 5-1.5 MG/5ML syrup Take 5 mLs by mouth at bedtime as needed for cough. 120 mL 0  . Insulin Detemir (LEVEMIR FLEXPEN) 100 UNIT/ML Pen Inject 30 Units into the skin daily. 15 mL 5  . ipratropium (ATROVENT) 0.03 % nasal spray Place 2 sprays into both nostrils 2 (two) times daily. Do not use for more than 5days. 30 mL 0  . IRON PO Take 65 mg by mouth daily.    Marland Kitchen loratadine (CLARITIN) 10 MG tablet Take 10 mg by mouth daily as needed (nasal drainage, drip).    Marland Kitchen losartan (COZAAR) 100 MG tablet Take 1 tablet (100 mg total) by mouth daily. 90 tablet 2  . meloxicam (MOBIC) 15 MG tablet Take 1 tablet (15 mg total) by mouth daily. TAKE WITH MEALS 30 tablet 0  . mometasone-formoterol (DULERA) 100-5 MCG/ACT AERO Take 2 puffs first thing in am and then another 2 puffs about 12 hours later. 1 Inhaler 11  . montelukast (SINGULAIR) 10 MG tablet Take 1 tablet (10 mg total) by mouth at bedtime. 30 tablet 5  .  ONE TOUCH LANCETS MISC Use as directed to help check blood sugars twice daily. Dx 250.00 60 each 5  . oxybutynin (DITROPAN XL) 10 MG 24 hr tablet Take 1 tablet (10 mg total) by mouth at bedtime. 90 tablet 1  . OXYGEN-HELIUM IN Inhale 2-3 L into the lungs daily.     Marland Kitchen oxymetazoline (AFRIN) 0.05 % nasal spray 2 puffs twice daily as needed for 5 days (before Fluticasone)    . predniSONE (DELTASONE) 10 MG tablet TAKE 1 TABLET BY MOUTH DAILY 90 tablet 1  . SitaGLIPtin-MetFORMIN HCl (JANUMET XR) [phone] MG TB24 Take 1 tablet by mouth daily. 90 tablet 2  . TURMERIC PO Take by mouth daily.    . [DISCONTINUED] DETROL LA 2 MG 24 hr capsule TAKE 1 CAPSULE BY MOUTH DAILY 30 capsule 10   No current facility-administered medications on file  prior to visit.     Past Medical History:  Diagnosis Date  . Allergic rhinitis   . Bronchitis   . Chronic rhinitis   . Cough   . Dyslipidemia   . Dysphagia   . GERD (gastroesophageal reflux disease)   . Headache(784.0)   . ILD (interstitial lung disease) ([place])   . Mild depression ([place])   . Morbid obesity ([place])   . Type II or diabetes mellitus, uncontrolled 11/2012 dx   Dx 12/08/12: a1c 10.0  . Unspecified essential hypertension   . Urge incontinence     Past Surgical History:  Procedure Laterality Date  . BILATERAL VATS ABLATION  02/15/04    Social History   Social History  . Marital status: Married    Spouse name: N/A  . Number of children: 1  . Years of education: N/A   Occupational History  . 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 Smoking status: Never Smoker  . Smokeless tobacco: Never Used     Comment: {  . Alcohol use No  . Drug use: No  . Sexual activity: Not on file   Other Topics Concern  . 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 Sarcoidosis Mother     dxed by transbrochial bx  . Allergies Mother   . Heart disease Father   . Diabetes Father   . Allergies Father   . Coronary artery disease Father   . Cancer Maternal Grandmother     CA of unknown type  . Cancer Paternal Grandmother     CA of unknown type    Review of Systems     Objective:  There were no vitals filed for this visit. There were no vitals filed for this visit. There is no height or weight on file to calculate BMI.   Physical Exam         Assessment & Plan:    See Problem List for Assessment and Plan of chronic medical problems.    This encounter was created in error - please disregard.

## 2016-08-14 ENCOUNTER — Encounter: Payer: BC Managed Care – PPO | Admitting: Internal Medicine

## 2016-08-16 ENCOUNTER — Other Ambulatory Visit: Payer: Self-pay | Admitting: Emergency Medicine

## 2016-08-16 MED ORDER — INSULIN PEN NEEDLE 31G X 8 MM MISC: 2 refills | Status: DC

## 2016-08-31 ENCOUNTER — Encounter: Payer: Self-pay | Admitting: Internal Medicine

## 2016-08-31 ENCOUNTER — Ambulatory Visit (INDEPENDENT_AMBULATORY_CARE_PROVIDER_SITE_OTHER): Payer: BC Managed Care – PPO | Admitting: Internal Medicine

## 2016-08-31 DIAGNOSIS — J9611 Chronic respiratory failure with hypoxia: Secondary | ICD-10-CM | POA: Diagnosis not present

## 2016-08-31 DIAGNOSIS — J8410 Pulmonary fibrosis, unspecified: Secondary | ICD-10-CM

## 2016-08-31 DIAGNOSIS — J45991 Cough variant asthma: Secondary | ICD-10-CM

## 2016-08-31 DIAGNOSIS — Z23 Encounter for immunization: Secondary | ICD-10-CM | POA: Diagnosis not present

## 2016-08-31 MED ORDER — PANTOPRAZOLE SODIUM 40 MG PO TBEC: 40.0000 mg | DELAYED_RELEASE_TABLET | Freq: Two times a day (BID) | ORAL | 11 refills | Status: DC

## 2016-08-31 MED ORDER — MOMETASONE FURO-FORMOTEROL FUM 200-5 MCG/ACT IN AERO: 2.0000 | INHALATION_SPRAY | Freq: Two times a day (BID) | RESPIRATORY_TRACT | 0 refills | Status: DC

## 2016-08-31 NOTE — Progress Notes (Signed)
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  Since then waxing/waning sx requiring intermittent prednisone tapers with only minimal improvement - most of her symptoms chronically have been related to obesity with dyspnea on exertion and tendency to chronic cough which is felt to be at least partly  reflux related but improved with saba 03/09/2015 so started on dulera 100 2bid in addition to maint pred and seems to have helped but never able to get < 10 mg pred s flare cough/sob     History of Present Illness  November 07, 2010 ov cc cough/ strangling  a bit more with taper off reglan (actually worse before ran out of the low dose reglan)  no excess mucus or increase sob on prednisone @  10 mg daily .  Using med calendar well rec 1)  Ok to resume water aerobics but pace yourself 2)  Wait until your swallowing evaulation is complete before making a decision re reglan > stopped it on her own.  3)  Try Prednisone 10 mg one-half each am   04/04/2012 f/u ov/Marco Adelson no longer using med calendar on Pred 10 mg one daily  added hyzaar to rx for hbp/leg swelling not back to mall walking yet,  No unusual cough, purulent sputum or sinus/hb symptoms on present rx. No variability to symptoms or need for inhaler. Rec Try prednisone 10 mg one half daily to see if affects your ability to walk the mall on 02 or the cough gets worse (walk the mall first for at least before you try) Always walk for exercise on 3lpm - this will help you burn fat and get into negative calorie balance to lose weight.   03/17/2013 f/u ov/Aden Youngman re NSIP on floor of 10 mg per day Chief Complaint  Patient presents with  . Follow-up    Breathing is overall doing well and she denies any new co's today.   ex on a treadmill x 53m x 3 x daily on 2lpm not the 3lpm as rec >>Prednisone ceiling is  20 mg per day and floor is 10 mg per day        10/06/2014 f/u ov/Diane Mochizuki re: pred 10 mg daily after trying 10/5 until Tgiving and worse cough/ sob  Chief Complaint  Patient presents with  . Follow-up    F/U ILD; breathing doing fine; no complaints  Not doing treadmill  Any more but Not limited by breathing from desired activities   rec Resume treadmill x 30 min daily minimum If  doing great try to reduce prednisone to 10 mg alternating with 5 mg daily as your new floor but increase back to ceiling of 20 mg per day if needed     03/09/2015 f/u ov/Frankey Botting re: NSIP  pred 10 mg daily could not tol 10/5  Chief Complaint  Patient presents with  . Follow-up    PFT done today. Pt states her breathing is overall doing well, unless she gets exposed to hot/humid weather. She is taking 10 mg pred daily.   2lpm sleep/ 3 lpm ex , flare of cough with < 10 mg per day pred, better cough p albuterol  rec Work on inhaler technique:    Try dulera 100 Take 2 puffs first thing in am and then another 2 puffs  about 12 hours later> helped some        02/06/2016  f/u ov/Rendon Howell re: NSIP / 3lpm with ex/ duler 100 2bid and pred 10 mg daily  Chief Complaint  Patient presents with  . Follow-up    Pt states that she did have some issues with allergies but is improving. Pt also was having issues with Dulera, jittery after use, but has started rinsing her mouth after use and side effects have improved. Pt denies cough/wheeze/SOB/CP/tightness.   limited by L knee from doing treadmill, has trainer but not losing wt  Rhinitis symptoms better on singulair   rec Work on inhaler technique:    Should be able to tolerate dulera 100 Take 2 puffs first thing in am and then another 2 puffs about 12 hours later.  If so, try prednisone 10 - 5  = even days take a half, odd days take half   06/01/2016  f/u ov/Kawthar Ennen re: NSIP  Chief Complaint  Patient presents with  . Follow-up    Breathing is doing well. She denies any new co's  today.   when ex 3lpm and back on treadmill x 25 min more limited by knee 2-3x per week at 81mph flat / minimal dry daytime cough on nexium 20 mg bid ac  rec Work on inhaler technique:   Continue  dulera 100 Take 2 puffs first thing in am and then another 2 puffs about 12 hours later.  try prednisone 10 - 5  = even days take a half, odd days take half   08/31/2016  f/u ov/Verdis Bassette re:  NSIP ? Cough variant asthma component maint on dulera 100 2bid  Chief Complaint  Patient presents with  . Follow-up    Increased cough x 2 wks  on treadmill x 3lpm x 25 min  Decreased pred Nov 13th and cough worse starting on 23rd  Cough worse at hs and early in am but non-productive  Nose feels dried out/ clogged up/ saline helps   No obvious day to day or daytime variabilty or assoc excess/ purulent sputum or mucus plugs   cp or chest tightness, subjective wheeze overt sinus or hb symptoms. No unusual exp hx or h/o childhood pna/ asthma or knowledge of premature birth.  Sleeping ok on 02 2lpm without nocturnal  or early am exacerbation  of respiratory  c/o's or need for noct saba. Also denies any obvious fluctuation of symptoms with weather or environmental changes or other aggravating or alleviating factors except as outlined above   Current Medications, Allergies, Complete Past Medical History, Past Surgical History, Family History, and Social History were reviewed in [employer] record.  ROS  The following are not active complaints unless bolded sore throat, dysphagia, dental problems, itching, sneezing,  nasal congestion or excess/ purulent secretions, ear ache,   fever, chills, sweats, unintended wt loss, pleuritic or exertional cp, hemoptysis,  orthopnea pnd or leg swelling, presyncope, palpitations, heartburn, abdominal pain, anorexia, nausea, vomiting, diarrhea  or change in bowel or urinary habits, change in stools or urine, dysuria,hematuria,  rash, arthralgias , visual complaints,  headache, numbness weakness or ataxia or problems with walking or coordination,  change in mood/affect or memory.             Past Medical History: GASTROESOPHAGEAL REFLUX DISEASE     - Tapered reglan to 10 mg one half twice daily June 22, 2010 > d/c'd 11/2010  -restarted reglan 03/2011 >>unable to tolerate.  INTERSTITIAL LUNG DISEASE...........................................Marland KitchenWert   -  VATS 02/15/2004 NSIP ([place] reviewed/confirmed)   -Restart Prednisone 02/24/08   -PFT's December 23, 2008 VC 35%  DLC0 31%   -Desats with > 2 laps 06/22/10 so rec wear 02 2lpm at bedtime and with ex  MORBID OBESITY   - Target wt  =  153  for BMI < 30  Hypertension Health Maintenance..........................................................Marland KitchenMarland KitchenAsa Lente    - Pneumovax July 22, 2009      - Flu 06/21/2011  Complex med regimen 06/21/2011 , 06/15/2013         Objective:   Physical Exam  Massively obese pleasant amb bf nad  Vital signs reviewed - note 98% on RA 06/01/2016     wt  305 May 28, 2008 >   302 November 07, 2010 >   303 03/22/2011 > 10/25/2011  299 > 311 04/04/2012 > 306 03/17/2013 >06/15/2013 303 > 10/06/2013 298  > 02/08/2014 289 > 10/06/2014   288 > 03/09/2015 283 > 10/21/2015    283 >  02/06/2016  284 > 06/01/2016  282 > 08/31/2016   281   HEENT: nl dentition, turbinates, and orophanx. Nl external ear canals without cough reflex Neck without JVD/Nodes/TM Lungs distant bs,  Distant BS w/ no wheezing / no cough on insp RRR no s3 or murmur or increase in P2. no edema Abd soft and benign with nl excursion in the supine position. Ext warm without calf tenderness, cyanosis clubbing        Assessment & Plan:

## 2016-08-31 NOTE — Patient Instructions (Addendum)
Stay on singulair each evening  Try dulera 200 Take 2 puffs first thing in am and then another 2 puffs about 12 hours later to see if helps or not by the time you return   Protonix 40 mg Take 30- 60 min before your first and last meals of the day   Prednisone 10 mg daily until better then 10 alternating with 5 mg daily   See Tammy NP w/in 2 weeks with all your medications, even over the counter meds, separated in two separate bags, the ones you take no matter what vs the ones you stop once you feel better and take only as needed when you feel you need them.   Tammy  will generate for you a new user friendly medication calendar that will put [place] all on the same page re: your medication use.     Without this process, it simply isn't possible to assure that we are providing  your outpatient care  with  the attention to detail we feel you deserve.   If we cannot assure that you're getting that kind of care,  then we cannot manage your problem effectively from this clinic.  Once you have seen Tammy and we are sure that we're all on the same page with your medication use she will arrange follow up with me.

## 2016-09-02 NOTE — Assessment & Plan Note (Signed)
-   Started on 02 06/22/10   - 04/05/2012  Walked 2lpm x 3 laps @ 185 ft each stopped due to  desat corrected on 3lpm so rec 3lpm with exertion  - 10/06/2013  Walked RA x 2 laps @ 185 ft each stopped due to  Cough and desat to 86% at more rapid pace than usual and on 3lpm could do 3 laps s difficulty     rx  As of 09/02/2016 = no 02 at rest, 3lpm with exertion, 2lpm at hs

## 2016-09-02 NOTE — Assessment & Plan Note (Addendum)
Body mass index is 54.72  - no real change Lab Results  Component Value Date   TSH 1.01 09/19/2015     Contributing to gerd tendency/ doe/reviewed the need and the process to achieve and maintain neg calorie balance > defer f/u primary care including intermittently monitoring thyroid status

## 2016-09-02 NOTE — Assessment & Plan Note (Addendum)
-   PFTs.  03/09/2015  14% better p B2 and less cough p saba - 03/09/2015   > try dulera 100 2bid   - 08/31/2016  After extensive coaching HFA effectiveness =    75% > try increase to 200 2bid due to cough and continue singulair for now but not sure it's doing much if anything here and once better consider d/c

## 2016-09-02 NOTE — Assessment & Plan Note (Signed)
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)   -Restarted Prednisone 02/24/08   -PFT's December 23, 2008 VC 35%  DLC0 31%   -PFT's 02/08/2014   VC 1.30 (49%) and DLCO is 70%  > rec pred 10/5 > did not tolerate - PFTs  03/09/2015     VC 1.27 (48%) and dlco is 72% @ 10 mg daily  - 02/06/2016 try 10 a/w 5 mg daily > did not try - 06/01/2016 try 10 a/w 5 daily > cough  flared p 1 week on lower dose   Not entirely clear the cough and pred dose are related but certainly it is suggestive since flare occurred so close to lowering the dose  - since may have component of airways dz will try increasing the dulera to 200 2bid and then taper again if tol  Concerned she is not keeping up with meds well -  To keep things simple, I have asked the patient to first separate medicines that are perceived as maintenance, that is to be taken daily "no matter what", from those medicines that are taken on only on an as-needed basis and I have given the patient examples of both, and then return to see our NP to generate a  detailed  medication calendar which should be followed until the next physician sees the patient and updates it.    I had an extended discussion with the patient reviewing all relevant studies completed to date and  lasting 15 to 20 minutes of a 25 minute visit    Each maintenance medication was reviewed in detail including most importantly the difference between maintenance and prns and under what circumstances the prns are to be triggered using an action plan format that is not reflected in the computer generated alphabetically organized AVS.    Please see AVS for unique instructions that I personally wrote and verbalized to the the pt in detail and then reviewed with pt  by my nurse highlighting any  changes in therapy recommended at today's visit to their plan of care.

## 2016-09-18 ENCOUNTER — Encounter: Payer: BC Managed Care – PPO | Admitting: Adult Health

## 2016-09-27 ENCOUNTER — Other Ambulatory Visit: Payer: Self-pay | Admitting: Internal Medicine

## 2016-10-05 ENCOUNTER — Ambulatory Visit (INDEPENDENT_AMBULATORY_CARE_PROVIDER_SITE_OTHER): Payer: BC Managed Care – PPO | Admitting: Adult Health

## 2016-10-05 ENCOUNTER — Other Ambulatory Visit (INDEPENDENT_AMBULATORY_CARE_PROVIDER_SITE_OTHER): Payer: BC Managed Care – PPO

## 2016-10-05 ENCOUNTER — Encounter: Payer: Self-pay | Admitting: Adult Health

## 2016-10-05 VITALS — BP 134/74 | HR 97 | Ht 60.0 in | Wt 284.0 lb

## 2016-10-05 DIAGNOSIS — J8410 Pulmonary fibrosis, unspecified: Secondary | ICD-10-CM

## 2016-10-05 DIAGNOSIS — J45991 Cough variant asthma: Secondary | ICD-10-CM

## 2016-10-05 DIAGNOSIS — J9611 Chronic respiratory failure with hypoxia: Secondary | ICD-10-CM | POA: Diagnosis not present

## 2016-10-05 LAB — SEDIMENTATION RATE: Sed Rate: 38 mm/hr — ABNORMAL HIGH (ref 0–20)

## 2016-10-05 NOTE — Progress Notes (Signed)
@Patient  ID: [name], female    DOB: 1970/11/25, 46 y.o.   MRN: [id number]  Chief Complaint  Patient presents with  . 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 Tx intermittently with steroids   TEST  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)   -Restarted Prednisone 02/24/08   -PFT's December 23, 2008 VC 35%  DLC0 31%   -PFT's 02/08/2014   VC 1.30 (49%) and DLCO is 70%  > rec pred 10/5 > did not tolerate - PFTs  03/09/2015     VC 1.27 (48%) and dlco is 72% @ 10 mg daily  - 02/06/2016 try 10 a/w 5 mg daily > did not try - 06/01/2016 try 10 a/w 5 daily > cough  flared p 1 week on lower dose  - 04/05/2012  Walked 2lpm x 3 laps @ 185 ft each stopped due to  desat corrected on 3lpm so rec 3lpm with exertion  - 10/06/2013  Walked RA x 2 laps @ 185 ft each stopped due to  Cough and desat to 86% at more rapid pace than usual and on 3lpm could do 3 laps s difficulty   09/02/2016 = no 02 at rest, 3lpm with exertion, 2lpm at hs    10/05/2016 Follow up; ILD /NSIP Bx proven /Asthma  Pt returns for 1 month follow up. Says she still gets winded easily , has daily dry cough .  She tried to decrease prednisone 67m daily last ov but more cough and dyspnea.  Does have hx of Lupus, RA and Sarcoid in family . Does complains of joint pain. Weight is up 80lbs over last 10 yrs.   Denies fever, chest pain, orthopnea, or discolored mucus.   Remains on O2 3l/m act and 2 l/m At bedtime    Reviewed all her meds and organized them into a med calendar with pt educaiton . Appears to be taking correctly.    Allergies  Allergen Reactions  . Penicillins     Immunization History  Administered Date(s) Administered  . Influenza Split 06/21/2011, 07/29/2012  . Influenza Whole 07/22/2009  . Influenza,inj,Quad PF,36+ Mos 06/15/2013, 08/19/2014, 09/19/2015, 08/31/2016  . Pneumococcal Conjugate-13 09/19/2015  .  Pneumococcal Polysaccharide-23 07/22/2009  . Td 03/24/2009    Past Medical History:  Diagnosis Date  . Allergic rhinitis   . Bronchitis   . Chronic rhinitis   . Cough   . Dyslipidemia   . Dysphagia   . GERD (gastroesophageal reflux disease)   . Headache(784.0)   . [place] disease[place]   . Mild depression ([place]   . Morbid [place] ([place]   . Type II or diabetes mellitus, uncontrolled 11/2012 dx   Dx 12/08/12: a1c 10.0  . Unspecified essential hypertension   . Urge incontinence     Tobacco History: History  Smoking Status  . Never Smoker  Smokeless Tobacco  . Never Used    Comment: {   Counseling given: Not Answered   Outpatient Encounter Prescriptions as of 10/05/2016  Medication Sig  . Ascorbic Acid (VITAMIN C PO) Take by mouth daily.  .Marland Kitchenaspirin EC 81 MG tablet Take 1 tablet (81 mg total) by mouth daily.  .Marland Kitchenatorvastatin (LIPITOR) 20 MG tablet TAKE 1 TABLET (20 MG TOTAL) BY MOUTH DAILY AT 6 PM.  . Blood Glucose Monitoring Suppl (ONE TOUCH ULTRA MINI)  W/DEVICE KIT Use as directed to check blood sugar twice a day. Dx 250.00  . Cholecalciferol (VITAMIN D) 2000 units CAPS Take 1 capsule by mouth daily.  . Diclofenac Sodium 2 % SOLN Apply 1 pump twice daily as needed.  Marland Kitchen glucose blood (ONE TOUCH ULTRA TEST) test strip Use to check blood sugars twice a day.  . hydrochlorothiazide (MICROZIDE) 12.5 MG capsule Take 1 capsule (12.5 mg total) by mouth daily as needed.  . Insulin Detemir (LEVEMIR FLEXPEN) 100 UNIT/ML Pen Inject 30 Units into the skin daily.  . Insulin Pen Needle (B-D ULTRAFINE III SHORT PEN) 31G X 8 MM MISC USE NEEDLE AS NEEDED TO INJECTED INSULIN AS PRESCRIBED BY PHYSICIAN  . IRON PO Take 65 mg by mouth daily.  Marland Kitchen loratadine (CLARITIN) 10 MG tablet Take 10 mg by mouth daily as needed (nasal drainage, drip).  Marland Kitchen losartan (COZAAR) 100 MG tablet Take 1 tablet (100 mg total) by mouth daily.  . meloxicam (MOBIC) 15 MG tablet Take 1 tablet (15 mg total) by  mouth daily. TAKE WITH MEALS  . mometasone-formoterol (DULERA) 100-5 MCG/ACT AERO Take 2 puffs first thing in am and then another 2 puffs about 12 hours later.  . mometasone-formoterol (DULERA) 200-5 MCG/ACT AERO Inhale 2 puffs into the lungs 2 (two) times daily.  . montelukast (SINGULAIR) 10 MG tablet Take 1 tablet (10 mg total) by mouth at bedtime.  . ONE TOUCH LANCETS MISC Use as directed to help check blood sugars twice daily. Dx 250.00  . oxybutynin (DITROPAN XL) 10 MG 24 hr tablet Take 1 tablet (10 mg total) by mouth at bedtime.  . OXYGEN-HELIUM IN Inhale 2-3 L into the lungs daily.   Marland Kitchen oxymetazoline (AFRIN) 0.05 % nasal spray 2 puffs twice daily as needed for 5 days (before Fluticasone)  . pantoprazole (PROTONIX) 40 MG tablet Take 1 tablet (40 mg total) by mouth 2 (two) times daily before a meal.  . predniSONE (DELTASONE) 10 MG tablet TAKE 1 TABLET BY MOUTH DAILY  . SitaGLIPtin-MetFORMIN HCl (JANUMET XR) [phone] MG TB24 Take 1 tablet by mouth daily.  . TURMERIC PO Take by mouth daily.   No facility-administered encounter medications on file as of 10/05/2016.      Review of Systems  Constitutional:   No  weight loss, night sweats,  Fevers, chills, + fatigue, or  lassitude.  HEENT:   No headaches,  Difficulty swallowing,  Tooth/dental problems, or  Sore throat,                No sneezing, itching, ear ache, ' +nasal congestion, post nasal drip,   CV:  No chest pain,  Orthopnea, PND, swelling in lower extremities, anasarca, dizziness, palpitations, syncope.   GI  No heartburn, indigestion, abdominal pain, nausea, vomiting, diarrhea, change in bowel habits, loss of appetite, bloody stools.   Resp:    No chest wall deformity  Skin: no rash or lesions.  GU: no dysuria, change in color of urine, no urgency or frequency.  No flank pain, no hematuria   MS:  No joint pain or swelling.  No decreased range of motion.  No back pain.    Physical Exam  BP 134/74 (BP Location: Left  Arm, Cuff Size: Normal)   Pulse 97   Ht 5' (1.524 m)   Wt 284 lb (128.8 kg)   SpO2 98%   BMI 55.46 kg/m   GEN: A/Ox3; pleasant , NAD, obese    HEENT:  [place]/AT,  EACs-clear, TMs-wnl, NOSE-clear,  THROAT-clear, no lesions, no postnasal drip or exudate noted.   NECK:  Supple w/ fair ROM; no JVD; normal carotid impulses w/o bruits; no thyromegaly or nodules palpated; no lymphadenopathy.    RESP  Clear  P & A; w/o, wheezes/ rales/ or rhonchi. no accessory muscle use, no dullness to percussion  CARD:  RRR, no m/r/g, no peripheral edema, pulses intact, no cyanosis or clubbing.  GI:   Soft & nt; nml bowel sounds; no organomegaly or masses detected.   Musco: Warm bil, no deformities or joint swelling noted.   Neuro: alert, no focal deficits noted.    Skin: Warm, no lesions or rashes  Psych:  No change in mood or affect. No depression or anxiety.  No memory loss.  Lab Results:  CBC    Component Value Date/Time   WBC 10.1 09/19/2015 1203   RBC 4.54 09/19/2015 1203   HGB 13.1 09/19/2015 1203   HCT 41.3 09/19/2015 1203   PLT 243.0 09/19/2015 1203   MCV 91.1 09/19/2015 1203   MCHC 31.8 09/19/2015 1203   RDW 14.1 09/19/2015 1203   LYMPHSABS 1.9 09/19/2015 1203   MONOABS 0.6 09/19/2015 1203   EOSABS 0.0 09/19/2015 1203   BASOSABS 0.1 09/19/2015 1203    BMET    Component Value Date/Time   NA 135 05/16/2016 0912   K 4.0 05/16/2016 0912   CL 99 05/16/2016 0912   CO2 30 05/16/2016 0912   GLUCOSE 234 (H) 05/16/2016 0912   BUN 12 05/16/2016 0912   CREATININE 0.71 05/16/2016 0912   CALCIUM 9.9 05/16/2016 0912   GFRNONAA 103.55 09/07/2009 1207   GFRAA 181 06/19/2007 1038    BNP No results found for: BNP  ProBNP    Component Value Date/Time   PROBNP 14.0 06/19/2007 1038    Imaging: No results found.   Assessment & Plan:   INTERSTITIAL LUNG DISEASE NSIP bx proven -flare of cough on lower steroid dose , resume 95m daily dosing , steroid education   Need serial PFT  and HRCT chest   Check autoimmune labs w/ RA/ANA /ESR (FH of lupus/sarcoid/RA  Plan  Patient Instructions  Follow med calendar closely and bring to each visit  Return to Prednisone 115mdaily  Labs today .  Set up for HRCT Chest  Follow up Dr. WeMelvyn NovasIn 3 months with PFT and As needed      Chronic respiratory failure with hypoxia ([facility] ThreeCont on o2   Cough variant asthma Cont on Dulera  Increase pred 1061maily   Plan  Patient Instructions  Follow med calendar closely and bring to each visit  Return to Prednisone 56m59mily  Labs today .  Set up for HRCT Chest  Follow up Dr. WertMelvyn Novas 3 months with PFT and As needed         TammRexene Edison 10/05/2016

## 2016-10-05 NOTE — Assessment & Plan Note (Signed)
Cont on o2 .  

## 2016-10-05 NOTE — Assessment & Plan Note (Signed)
Cont on Dulera  Increase pred 10mg  daily   Plan  Patient Instructions  Follow med calendar closely and bring to each visit  Return to Prednisone 10mg  daily  Labs today .  Set up for HRCT Chest  Follow up Dr. Melvyn Novas  In 3 months with PFT and As needed

## 2016-10-05 NOTE — Patient Instructions (Addendum)
Follow med calendar closely and bring to each visit  Return to Prednisone 10mg  daily  Labs today .  Set up for HRCT Chest  Follow up Dr. Melvyn Novas  In 3 months with PFT and As needed

## 2016-10-05 NOTE — Assessment & Plan Note (Signed)
NSIP bx proven -flare of cough on lower steroid dose , resume 5m daily dosing , steroid education   Need serial PFT and HRCT chest   Check autoimmune labs w/ RA/ANA /ESR (FH of lupus/sarcoid/RA  Plan  Patient Instructions  Follow med calendar closely and bring to each visit  Return to Prednisone 145mdaily  Labs today .  Set up for HRCT Chest  Follow up Dr. WeMelvyn NovasIn 3 months with PFT and As needed

## 2016-10-06 NOTE — Progress Notes (Signed)
Chart and office note reviewed in detail  > agree with a/p as outlined    

## 2016-10-08 LAB — RHEUMATOID FACTOR: Rhuematoid fact SerPl-aCnc: <14 IU/mL (ref <14)

## 2016-10-08 LAB — ANA: ANA: POSITIVE — AB

## 2016-10-08 LAB — ANTI-NUCLEAR AB-TITER (ANA TITER)

## 2016-10-09 ENCOUNTER — Ambulatory Visit (INDEPENDENT_AMBULATORY_CARE_PROVIDER_SITE_OTHER)
Admission: RE | Admit: 2016-10-09 | Discharge: 2016-10-09 | Disposition: A | Discharge status: Home / Self Care | Payer: BC Managed Care – PPO | Source: Ambulatory Visit | Attending: Adult Health | Admitting: Adult Health

## 2016-10-09 DIAGNOSIS — J8410 Pulmonary fibrosis, unspecified: Secondary | ICD-10-CM | POA: Diagnosis not present

## 2016-10-10 ENCOUNTER — Ambulatory Visit (INDEPENDENT_AMBULATORY_CARE_PROVIDER_SITE_OTHER): Payer: BC Managed Care – PPO | Admitting: Nurse Practitioner

## 2016-10-10 ENCOUNTER — Encounter: Payer: Self-pay | Admitting: Nurse Practitioner

## 2016-10-10 VITALS — BP 128/90 | HR 120 | Temp 98.6°F | Ht 60.0 in | Wt 282.0 lb

## 2016-10-10 DIAGNOSIS — J309 Allergic rhinitis, unspecified: Secondary | ICD-10-CM | POA: Diagnosis not present

## 2016-10-10 DIAGNOSIS — J060 Acute laryngopharyngitis: Secondary | ICD-10-CM | POA: Diagnosis not present

## 2016-10-10 DIAGNOSIS — J45991 Cough variant asthma: Secondary | ICD-10-CM | POA: Diagnosis not present

## 2016-10-10 LAB — POCT RAPID STREP A (OFFICE): RAPID STREP A SCREEN: NEGATIVE

## 2016-10-10 MED ORDER — METHYLPREDNISOLONE ACETATE 40 MG/ML IJ SUSP
40.0000 mg | Freq: Once | INTRAMUSCULAR | Status: AC
  Administered 2016-10-10: 40 mg via INTRAMUSCULAR

## 2016-10-10 MED ORDER — BENZONATATE 100 MG PO CAPS: 100.0000 mg | ORAL_CAPSULE | Freq: Three times a day (TID) | ORAL | 0 refills | Status: DC | PRN

## 2016-10-10 NOTE — Progress Notes (Signed)
Reviewed with patient in office. See office note

## 2016-10-10 NOTE — Progress Notes (Signed)
Subjective:  Patient ID: [name], female    DOB: 07-15-1971  Age: 46 y.o. MRN: [id number]  CC: Flu Like Symptoms (Feelings bad since Friday. Cough started being productive yesterday with yellow phlem. )   Cough  This is a recurrent problem. The current episode started more than 1 month ago. The problem has been waxing and waning. The problem occurs constantly. The cough is productive of sputum. Associated symptoms include headaches, myalgias, nasal congestion, postnasal drip, rhinorrhea, a sore throat and shortness of breath. Pertinent negatives include no chest pain, chills, ear congestion, ear pain, fever, heartburn, hemoptysis, sweats, weight loss or wheezing. The symptoms are aggravated by lying down and cold air. She has tried oral steroids, prescription cough suppressant, steroid inhaler and OTC cough suppressant for the symptoms. The treatment provided mild relief. Her past medical history is significant for asthma, bronchitis and environmental allergies.    Outpatient Medications Prior to Visit  Medication Sig Dispense Refill  . Ascorbic Acid (VITAMIN C PO) Take by mouth daily.    Marland Kitchen aspirin EC 81 MG tablet Take 1 tablet (81 mg total) by mouth daily. 150 tablet 2  . atorvastatin (LIPITOR) 20 MG tablet TAKE 1 TABLET (20 MG TOTAL) BY MOUTH DAILY AT 6 PM. 90 tablet 2  . Blood Glucose Monitoring Suppl (ONE TOUCH ULTRA MINI) W/DEVICE KIT Use as directed to check blood sugar twice a day. Dx 250.00 1 each 0  . Cholecalciferol (VITAMIN D) 2000 units CAPS Take 1 capsule by mouth daily.    . Diclofenac Sodium 2 % SOLN Apply 1 pump twice daily as needed. 112 g 3  . glucose blood (ONE TOUCH ULTRA TEST) test strip Use to check blood sugars twice a day. 100 each 4  . hydrochlorothiazide (MICROZIDE) 12.5 MG capsule Take 1 capsule (12.5 mg total) by mouth daily as needed. 30 capsule 2  . Insulin Detemir (LEVEMIR FLEXPEN) 100 UNIT/ML Pen Inject 30 Units into the skin daily. 15 mL 5  . Insulin Pen  Needle (B-D ULTRAFINE III SHORT PEN) 31G X 8 MM MISC USE NEEDLE AS NEEDED TO INJECTED INSULIN AS PRESCRIBED BY PHYSICIAN 100 each 2  . IRON PO Take 65 mg by mouth daily.    Marland Kitchen loratadine (CLARITIN) 10 MG tablet Take 10 mg by mouth daily as needed (nasal drainage, drip).    Marland Kitchen losartan (COZAAR) 100 MG tablet Take 1 tablet (100 mg total) by mouth daily. 90 tablet 2  . meloxicam (MOBIC) 15 MG tablet Take 1 tablet (15 mg total) by mouth daily. TAKE WITH MEALS 30 tablet 0  . mometasone-formoterol (DULERA) 100-5 MCG/ACT AERO Take 2 puffs first thing in am and then another 2 puffs about 12 hours later. 1 Inhaler 11  . mometasone-formoterol (DULERA) 200-5 MCG/ACT AERO Inhale 2 puffs into the lungs 2 (two) times daily. 1 Inhaler 0  . montelukast (SINGULAIR) 10 MG tablet Take 1 tablet (10 mg total) by mouth at bedtime. 30 tablet 5  . ONE TOUCH LANCETS MISC Use as directed to help check blood sugars twice daily. Dx 250.00 60 each 5  . oxybutynin (DITROPAN XL) 10 MG 24 hr tablet Take 1 tablet (10 mg total) by mouth at bedtime. 90 tablet 1  . OXYGEN-HELIUM IN Inhale 2-3 L into the lungs daily.     Marland Kitchen oxymetazoline (AFRIN) 0.05 % nasal spray 2 puffs twice daily as needed for 5 days (before Fluticasone)    . pantoprazole (PROTONIX) 40 MG tablet Take 1 tablet (40  mg total) by mouth 2 (two) times daily before a meal. 60 tablet 11  . predniSONE (DELTASONE) 10 MG tablet TAKE 1 TABLET BY MOUTH DAILY 90 tablet 0  . SitaGLIPtin-MetFORMIN HCl (JANUMET XR) [phone] MG TB24 Take 1 tablet by mouth daily. 90 tablet 2  . TURMERIC PO Take by mouth daily.     No facility-administered medications prior to visit.     ROS See HPI  Objective:  BP 128/90 (BP Location: Left Arm, Patient Position: Sitting, Cuff Size: Large)   Pulse (!) 120   Temp 98.6 F (37 C) (Oral)   Ht 5' (1.524 m)   Wt 282 lb (127.9 kg)   SpO2 98%   BMI 55.07 kg/m   BP Readings from Last 3 Encounters:  10/10/16 128/90  10/05/16 134/74  08/31/16  124/80    Wt Readings from Last 3 Encounters:  10/10/16 282 lb (127.9 kg)  10/05/16 284 lb (128.8 kg)  08/31/16 280 lb 3.2 oz (127.1 kg)    Physical Exam  Constitutional: She is oriented to person, place, and time. No distress.  Neck: Normal range of motion. Neck supple. No thyromegaly present.  Cardiovascular: Normal rate, regular rhythm and normal heart sounds.   Pulmonary/Chest: Effort normal and breath sounds normal.  Musculoskeletal: She exhibits no edema.  Lymphadenopathy:    She has no cervical adenopathy.  Neurological: She is alert and oriented to person, place, and time.  Vitals reviewed.   Lab Results  Component Value Date   WBC 10.1 09/19/2015   HGB 13.1 09/19/2015   HCT 41.3 09/19/2015   PLT 243.0 09/19/2015   GLUCOSE 234 (H) 05/16/2016   CHOL 160 09/19/2015   TRIG 89.0 09/19/2015   HDL 38.70 (L) 09/19/2015   LDLDIRECT 153.1 12/08/2012   LDLCALC 104 (H) 09/19/2015   ALT 16 05/16/2016   AST 11 05/16/2016   NA 135 05/16/2016   K 4.0 05/16/2016   CL 99 05/16/2016   CREATININE 0.71 05/16/2016   BUN 12 05/16/2016   CO2 30 05/16/2016   TSH 1.01 09/19/2015   HGBA1C 10.0 (H) 05/16/2016   MICROALBUR 1.1 05/16/2016    Ct Chest High Resolution  Result Date: 10/09/2016 CLINICAL DATA:  History of interstitial lung disease. Worsening cough and dyspnea. EXAM: CT CHEST WITHOUT CONTRAST TECHNIQUE: Multidetector CT imaging of the chest was performed following the standard protocol without intravenous contrast. High resolution imaging of the lungs, as well as inspiratory and expiratory imaging, was performed. COMPARISON:  10/06/2013 chest radiograph.   01/11/2004 chest CT. FINDINGS: Cardiovascular: Mild cardiomegaly. No significant pericardial fluid/thickening. Left anterior descending coronary atherosclerosis. Normal course and caliber of the thoracic aorta. Dilated main pulmonary artery (3.8 cm diameter). Mediastinum/Nodes: No discrete thyroid nodules. Unremarkable  esophagus. No pathologically enlarged axillary, mediastinal or gross hilar lymph nodes, noting limited sensitivity for the detection of hilar adenopathy on this noncontrast study. Lungs/Pleura: No pneumothorax. No pleural effusion. No significant lobular air trapping on the expiration sequence. There is patchy reticulation and ground-glass attenuation throughout both lungs involving the subpleural greater than peribronchovascular lungs, with associated mild traction bronchiectasis, volume loss and mild architectural distortion, predominantly at the lung bases. There is a basilar gradient. No frank honeycombing. There is mild progression of the mild traction bronchiectasis compared to the 2005 chest CT study, although the reticulation and ground-glass attenuation is largely unchanged since 2005. No acute consolidative airspace disease, lung masses or significant pulmonary nodules. A surgical suture line is seen at the left lung base. Upper abdomen:  Unremarkable. Musculoskeletal: No aggressive appearing focal osseous lesions. Moderate thoracic spondylosis. IMPRESSION: 1. Basilar predominant interstitial lung disease characterized by subpleural greater than peribronchovascular reticulation and ground-glass attenuation, mild traction bronchiectasis, volume loss and mild distortion. No frank honeycombing. Overall limited change since a 2005 chest CT as detailed. 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 The minimal change over the prior 13 years is not consistent with usual interstitial pneumonia (UIP). 2. Mild cardiomegaly.  One vessel coronary atherosclerosis. 3. Dilated main pulmonary artery, suggesting pulmonary arterial hypertension. Electronically Signed   By: Ilona Sorrel M.D.   On: 10/09/2016 13:25    Assessment & Plan:   Hudsyn was seen today for flu like symptoms.  Diagnoses and all orders for this visit:  Cough variant asthma -     methylPREDNISolone acetate  (DEPO-MEDROL) injection 40 mg; Inject 1 mL (40 mg total) into the muscle once.  Chronic allergic rhinitis, unspecified seasonality, unspecified trigger  Sore throat and laryngitis -     POCT rapid strep A   I am having Ms. Joswick maintain her ONE TOUCH LANCETS, ONE TOUCH ULTRA MINI, loratadine, oxymetazoline, OXYGEN-HELIUM IN, mometasone-formoterol, losartan, SitaGLIPtin-MetFORMIN HCl, hydrochlorothiazide, oxybutynin, aspirin EC, atorvastatin, montelukast, meloxicam, Diclofenac Sodium, IRON PO, TURMERIC PO, Ascorbic Acid (VITAMIN C PO), Vitamin D, glucose blood, Insulin Detemir, Insulin Pen Needle, mometasone-formoterol, pantoprazole, and predniSONE. We administered methylPREDNISolone acetate.  Meds ordered this encounter  Medications  . methylPREDNISolone acetate (DEPO-MEDROL) injection 40 mg    Follow-up: Return if symptoms worsen or fail to improve.  Wilfred Lacy, NP

## 2016-10-10 NOTE — Patient Instructions (Addendum)
May use chloraseptic lozenges as needed for sore throat.  Encourage adequate oral hydration.   follow up with pulmonologist on chest CT results

## 2016-10-10 NOTE — Progress Notes (Signed)
Pre visit review using our clinic review tool, if applicable. No additional management support is needed unless otherwise documented below in the visit note. 

## 2016-10-12 ENCOUNTER — Other Ambulatory Visit: Payer: Self-pay | Admitting: Adult Health

## 2016-10-12 DIAGNOSIS — R768 Other specified abnormal immunological findings in serum: Secondary | ICD-10-CM

## 2016-10-16 NOTE — Addendum Note (Signed)
Addended by: Doroteo Glassman D on: 10/16/2016 01:46 PM   Modules accepted: Orders

## 2016-10-29 ENCOUNTER — Telehealth: Payer: Self-pay | Admitting: Internal Medicine

## 2016-10-29 ENCOUNTER — Encounter: Payer: Self-pay | Admitting: Internal Medicine

## 2016-10-29 ENCOUNTER — Ambulatory Visit (INDEPENDENT_AMBULATORY_CARE_PROVIDER_SITE_OTHER): Payer: BC Managed Care – PPO | Admitting: Internal Medicine

## 2016-10-29 VITALS — BP 134/90 | HR 94 | Temp 98.2°F | Resp 16 | Wt 276.0 lb

## 2016-10-29 DIAGNOSIS — L989 Disorder of the skin and subcutaneous tissue, unspecified: Secondary | ICD-10-CM | POA: Diagnosis not present

## 2016-10-29 DIAGNOSIS — L03113 Cellulitis of right upper limb: Secondary | ICD-10-CM | POA: Diagnosis not present

## 2016-10-29 DIAGNOSIS — L089 Local infection of the skin and subcutaneous tissue, unspecified: Secondary | ICD-10-CM | POA: Insufficient documentation

## 2016-10-29 MED ORDER — CLOBETASOL PROP EMOLLIENT BASE 0.05 % EX CREA: 1.0000 "application " | TOPICAL_CREAM | Freq: Two times a day (BID) | CUTANEOUS | 0 refills | Status: DC

## 2016-10-29 MED ORDER — DOXYCYCLINE HYCLATE 100 MG PO TABS: 100.0000 mg | ORAL_TABLET | Freq: Two times a day (BID) | ORAL | 0 refills | Status: DC

## 2016-10-29 MED ORDER — FLUCONAZOLE 150 MG PO TABS: 150.0000 mg | ORAL_TABLET | Freq: Once | ORAL | 0 refills | Status: DC

## 2016-10-29 MED ORDER — FLUCONAZOLE 150 MG PO TABS: 150.0000 mg | ORAL_TABLET | Freq: Once | ORAL | 0 refills | Status: AC

## 2016-10-29 NOTE — Telephone Encounter (Signed)
Noted-it says she is being seen today - I do not see any scheduled appt's today in our system?

## 2016-10-29 NOTE — Telephone Encounter (Signed)
Patient Name: [name]  DOB: 1971-09-06    Initial Comment Caller states she has bed bug bites from a hotel she stayed in. One on her elbow is warm to touch.    Nurse Assessment  Nurse: Orvan Seen, RN, Jacquilin Date/Time (Eastern Time): 10/29/2016 10:29:50 AM  Confirm and document reason for call. If symptomatic, describe symptoms. ---Caller states she has bed bug bites from a hotel she stayed in. One on her elbow is warm to touch. No fever.  Does the patient have any new or worsening symptoms? ---Yes  Will a triage be completed? ---Yes  Related visit to physician within the last 2 weeks? ---No  Does the PT have any chronic conditions? (i.e. diabetes, asthma, etc.) ---Yes  List chronic conditions. ---asthma, diabetic  Is the patient pregnant or possibly pregnant? (Ask all females between the ages of 77-55) ---No  Is this a behavioral health or substance abuse call? ---No     Guidelines    Guideline Title Affirmed Question Affirmed Notes  Bed Bug Bite [1] Red or very tender (to touch) area AND [2] getting larger over 48 hours after the bite    Final Disposition User   See Physician within 24 Hours Atkins, RN, Jacquilin    Comments  appointment made for today with PCP at 1345   Referrals  REFERRED TO PCP OFFICE   Disagree/Comply: Comply

## 2016-10-29 NOTE — Assessment & Plan Note (Signed)
Early cellulitis at right elbow lesion ? Related to a bed bug bite or some other cause of lesions Will start antibiotic - doxycycline BID x 7 days Diflucan prn If no improvement she will call

## 2016-10-29 NOTE — Telephone Encounter (Signed)
Please advise 

## 2016-10-29 NOTE — Patient Instructions (Addendum)
Start doxycycline 100 mg twice daily for 7 days.  Use the topical steroid cream twice daily on the skin lesions    If there is no improvement call me.

## 2016-10-29 NOTE — Progress Notes (Signed)
Pre visit review using our clinic review tool, if applicable. No additional management support is needed unless otherwise documented below in the visit note. 

## 2016-10-29 NOTE — Assessment & Plan Note (Signed)
Several lesions on both arms and one on buttock Possible bed bug bites, but concern for something allergic or autoimmune as well On oral prednisone Will start topical steroid - clobetasol twice daily Continue benadryl Call if no improvement

## 2016-10-29 NOTE — Progress Notes (Signed)
  Subjective:    Patient ID: [name], female    DOB: 08/20/1971, 46 y.o.   MRN: [id number]  HPI She is here for an acute visit. She thinks she has bed bug bites.  She stayed at a hotel two nights over the weekend.  She had an itchy bump on her right elbow and right forearm.  Yesterday afternoon she had additional bites on her right arm, left arm and one on her buttock region. The lesion on the right elbow is swollen, tender and red.  She is concerned it may be infected.  She has had bed bug bites in the past and these are similar.    She denies fever.  She has had itching and difficulty sleeping at night.    She has tried otc hydrocortisone cream and benadryl.  They both help a little.    She denies seeing any bugs when she was at the hotel or at home.  Her godmother was in the same bed and did not get bit.    She is taking oral prednisone daily for lung disease.  She will be seeing a rheumatologist next month for a possible ANA.   Medications and allergies reviewed with patient and updated if appropriate.  Patient Active Problem List   Diagnosis Date Noted  . Knee pain, left 02/02/2016  . Cough variant asthma 03/09/2015  . Diabetes type 2, uncontrolled (HCC) 12/09/2012  . Dyslipidemia   . Chronic respiratory failure with hypoxia (HCC) 04/05/2012  . CONJUNCTIVITIS, ALLERGIC, SEASONAL 01/05/2010  . Allergic rhinitis 01/05/2010  . Essential hypertension 09/07/2009  . MUSCLE SPASM 09/07/2009  . DEPRESSION, MILD 03/24/2009  . HEADACHE 03/24/2009  . URINARY INCONTINENCE, URGE 03/24/2009  . CHRONIC RHINITIS 12/23/2008  . Severe obesity (BMI >= 40) (HCC) 10/10/2007  . INTERSTITIAL LUNG DISEASE 10/10/2007  . GASTROESOPHAGEAL REFLUX DISEASE 10/10/2007    Current Outpatient Prescriptions on File Prior to Visit  Medication Sig Dispense Refill  . albuterol (PROVENTIL HFA;VENTOLIN HFA) 108 (90 Base) MCG/ACT inhaler Inhale 2 puffs into the lungs every 6 (six) hours as needed for  wheezing or shortness of breath.    . Ascorbic Acid (VITAMIN C PO) Take by mouth daily.    . aspirin EC 81 MG tablet Take 1 tablet (81 mg total) by mouth daily. 150 tablet 2  . atorvastatin (LIPITOR) 20 MG tablet TAKE 1 TABLET (20 MG TOTAL) BY MOUTH DAILY AT 6 PM. 90 tablet 2  . Blood Glucose Monitoring Suppl (ONE TOUCH ULTRA MINI) W/DEVICE KIT Use as directed to check blood sugar twice a day. Dx 250.00 1 each 0  . dextromethorphan-guaiFENesin (MUCINEX DM) 30-600 MG 12hr tablet Take 1 tablet by mouth 2 (two) times daily as needed for cough.    . fluticasone (FLONASE) 50 MCG/ACT nasal spray Place 2 sprays into both nostrils daily as needed for allergies or rhinitis.    . glucose blood (ONE TOUCH ULTRA TEST) test strip Use to check blood sugars twice a day. 100 each 4  . hydrochlorothiazide (MICROZIDE) 12.5 MG capsule Take 1 capsule (12.5 mg total) by mouth daily as needed. 30 capsule 2  . Insulin Detemir (LEVEMIR FLEXPEN) 100 UNIT/ML Pen Inject 30 Units into the skin daily. 15 mL 5  . Insulin Pen Needle (B-D ULTRAFINE III SHORT PEN) 31G X 8 MM MISC USE NEEDLE AS NEEDED TO INJECTED INSULIN AS PRESCRIBED BY PHYSICIAN 100 each 2  . IRON PO Take 65 mg by mouth daily.    .   loratadine (CLARITIN) 10 MG tablet Take 10 mg by mouth daily as needed (nasal drainage, drip).    . mometasone-formoterol (DULERA) 100-5 MCG/ACT AERO Take 2 puffs first thing in am and then another 2 puffs about 12 hours later. 1 Inhaler 11  . montelukast (SINGULAIR) 10 MG tablet Take 1 tablet (10 mg total) by mouth at bedtime. 30 tablet 5  . ONE TOUCH LANCETS MISC Use as directed to help check blood sugars twice daily. Dx 250.00 60 each 5  . oxybutynin (DITROPAN XL) 10 MG 24 hr tablet Take 1 tablet (10 mg total) by mouth at bedtime. 90 tablet 1  . OXYGEN-HELIUM IN Inhale 2-3 L into the lungs daily.     . pantoprazole (PROTONIX) 40 MG tablet Take 1 tablet (40 mg total) by mouth 2 (two) times daily before a meal. 60 tablet 11  .  predniSONE (DELTASONE) 10 MG tablet TAKE 1 TABLET BY MOUTH DAILY (Patient taking differently: TAKE 1/2 TABLET BY MOUTH DAILY) 90 tablet 0  . SitaGLIPtin-MetFORMIN HCl (JANUMET XR) 100-1000 MG TB24 Take 1 tablet by mouth daily. 90 tablet 2  . TURMERIC PO Take by mouth daily.    . [DISCONTINUED] DETROL LA 2 MG 24 hr capsule TAKE 1 CAPSULE BY MOUTH DAILY 30 capsule 10   No current facility-administered medications on file prior to visit.     Past Medical History:  Diagnosis Date  . Allergic rhinitis   . Bronchitis   . Chronic rhinitis   . Cough   . Dyslipidemia   . Dysphagia   . GERD (gastroesophageal reflux disease)   . Headache(784.0)   . ILD (interstitial lung disease) (HCC)   . Mild depression (HCC)   . Morbid obesity (HCC)   . Type II or diabetes mellitus, uncontrolled 11/2012 dx   Dx 12/08/12: a1c 10.0  . Unspecified essential hypertension   . Urge incontinence     Past Surgical History:  Procedure Laterality Date  . BILATERAL VATS ABLATION  02/15/04    Social History   Social History  . Marital status: Married    Spouse name: N/A  . Number of children: 1  . Years of education: N/A   Occupational History  . 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 Smoking status: Never Smoker  . Smokeless tobacco: Never Used     Comment: {  . Alcohol use No  . Drug use: No  . Sexual activity: Not on file   Other Topics Concern  . 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 Sarcoidosis Mother     dxed by transbrochial bx  . Allergies Mother   . Heart disease Father   . Diabetes Father   . Allergies Father   . Coronary artery disease Father   . Cancer Maternal Grandmother     CA of unknown type  . Cancer Paternal Grandmother     CA of unknown type    Review of  Systems  Constitutional: Negative for fever.  HENT: Negative for sore throat and trouble swallowing.   Respiratory: Negative for cough, shortness of breath and wheezing.   Cardiovascular: Negative for chest pain and palpitations.  Neurological: Negative for light-headedness and headaches.         Objective:   Vitals:   10/29/16 1348  BP: 134/90  Pulse: 94  Resp: 16  Temp: 98.2 F (36.8 C)   Filed Weights   10/29/16 1348  Weight: 276 lb (125.2 kg)   Body mass index is 53.9 kg/m.  Wt Readings from Last 3 Encounters:  10/29/16 276 lb (125.2 kg)  10/10/16 282 lb (127.9 kg)  10/05/16 284 lb (128.8 kg)     Physical Exam  Constitutional: She appears well-developed and well-nourished. No distress.  HENT:  Head: Normocephalic and atraumatic.  Eyes: Conjunctivae are normal.  Musculoskeletal:  No joint swelling; no right elbow joint swelling or effusion  Skin: She is not diaphoretic.  Left arm and right arm with 4-6 lesions that look like hives - slightly elevated, warm and tender.  The lesion at the right elbow is more warm than the other lesions and more tender - surrounding erythema           Assessment & Plan:   See Problem List for Assessment and Plan of chronic medical problems.  

## 2016-11-06 ENCOUNTER — Encounter (HOSPITAL_COMMUNITY): Payer: Self-pay | Admitting: Emergency Medicine

## 2016-11-06 ENCOUNTER — Telehealth: Payer: Self-pay | Admitting: Internal Medicine

## 2016-11-06 ENCOUNTER — Inpatient Hospital Stay (HOSPITAL_COMMUNITY)
Admission: EM | Admit: 2016-11-06 | Discharge: 2016-11-09 | DRG: 193 | Disposition: A | Discharge status: Home / Self Care | Payer: Medicare Other | Attending: Internal Medicine | Admitting: Internal Medicine

## 2016-11-06 ENCOUNTER — Emergency Department (HOSPITAL_COMMUNITY): Payer: Medicare Other

## 2016-11-06 DIAGNOSIS — E785 Hyperlipidemia, unspecified: Secondary | ICD-10-CM | POA: Diagnosis present

## 2016-11-06 DIAGNOSIS — Z6841 Body Mass Index (BMI) 40.0 and over, adult: Secondary | ICD-10-CM

## 2016-11-06 DIAGNOSIS — J849 Interstitial pulmonary disease, unspecified: Secondary | ICD-10-CM | POA: Diagnosis present

## 2016-11-06 DIAGNOSIS — Z7952 Long term (current) use of systemic steroids: Secondary | ICD-10-CM

## 2016-11-06 DIAGNOSIS — Z91018 Allergy to other foods: Secondary | ICD-10-CM

## 2016-11-06 DIAGNOSIS — K219 Gastro-esophageal reflux disease without esophagitis: Secondary | ICD-10-CM | POA: Diagnosis present

## 2016-11-06 DIAGNOSIS — Z8249 Family history of ischemic heart disease and other diseases of the circulatory system: Secondary | ICD-10-CM

## 2016-11-06 DIAGNOSIS — Z794 Long term (current) use of insulin: Secondary | ICD-10-CM

## 2016-11-06 DIAGNOSIS — E1169 Type 2 diabetes mellitus with other specified complication: Secondary | ICD-10-CM | POA: Diagnosis present

## 2016-11-06 DIAGNOSIS — E119 Type 2 diabetes mellitus without complications: Secondary | ICD-10-CM | POA: Diagnosis present

## 2016-11-06 DIAGNOSIS — J111 Influenza due to unidentified influenza virus with other respiratory manifestations: Secondary | ICD-10-CM | POA: Diagnosis not present

## 2016-11-06 DIAGNOSIS — J101 Influenza due to other identified influenza virus with other respiratory manifestations: Principal | ICD-10-CM | POA: Diagnosis present

## 2016-11-06 DIAGNOSIS — J9621 Acute and chronic respiratory failure with hypoxia: Secondary | ICD-10-CM | POA: Diagnosis present

## 2016-11-06 DIAGNOSIS — Z7982 Long term (current) use of aspirin: Secondary | ICD-10-CM | POA: Diagnosis not present

## 2016-11-06 DIAGNOSIS — Z880 Allergy status to penicillin: Secondary | ICD-10-CM

## 2016-11-06 DIAGNOSIS — R0789 Other chest pain: Secondary | ICD-10-CM | POA: Diagnosis not present

## 2016-11-06 DIAGNOSIS — Z7951 Long term (current) use of inhaled steroids: Secondary | ICD-10-CM | POA: Diagnosis not present

## 2016-11-06 DIAGNOSIS — Z809 Family history of malignant neoplasm, unspecified: Secondary | ICD-10-CM | POA: Diagnosis not present

## 2016-11-06 DIAGNOSIS — J9611 Chronic respiratory failure with hypoxia: Secondary | ICD-10-CM | POA: Diagnosis not present

## 2016-11-06 DIAGNOSIS — E1165 Type 2 diabetes mellitus with hyperglycemia: Secondary | ICD-10-CM | POA: Diagnosis present

## 2016-11-06 DIAGNOSIS — J8410 Pulmonary fibrosis, unspecified: Secondary | ICD-10-CM | POA: Diagnosis not present

## 2016-11-06 DIAGNOSIS — Z833 Family history of diabetes mellitus: Secondary | ICD-10-CM

## 2016-11-06 DIAGNOSIS — R0600 Dyspnea, unspecified: Secondary | ICD-10-CM | POA: Diagnosis not present

## 2016-11-06 DIAGNOSIS — I10 Essential (primary) hypertension: Secondary | ICD-10-CM | POA: Diagnosis not present

## 2016-11-06 DIAGNOSIS — IMO0002 Reserved for concepts with insufficient information to code with codable children: Secondary | ICD-10-CM | POA: Diagnosis present

## 2016-11-06 DIAGNOSIS — D899 Disorder involving the immune mechanism, unspecified: Secondary | ICD-10-CM | POA: Diagnosis present

## 2016-11-06 DIAGNOSIS — J09X2 Influenza due to identified novel influenza A virus with other respiratory manifestations: Secondary | ICD-10-CM | POA: Diagnosis not present

## 2016-11-06 DIAGNOSIS — I152 Hypertension secondary to endocrine disorders: Secondary | ICD-10-CM | POA: Diagnosis present

## 2016-11-06 LAB — COMPREHENSIVE METABOLIC PANEL
ALBUMIN: 3.7 g/dL (ref 3.5–5.0)
ALT: 21 U/L (ref 14–54)
AST: 20 U/L (ref 15–41)
Alkaline Phosphatase: 67 U/L (ref 38–126)
Anion gap: 10 (ref 5–15)
BUN: 8 mg/dL (ref 6–20)
CHLORIDE: 96 mmol/L — AB (ref 101–111)
CO2: 30 mmol/L (ref 22–32)
Calcium: 8.8 mg/dL — ABNORMAL LOW (ref 8.9–10.3)
Creatinine, Ser: 0.57 mg/dL (ref 0.44–1.00)
GFR calc Af Amer: >60 mL/min (ref >60)
Glucose, Bld: 179 mg/dL — ABNORMAL HIGH (ref 65–99)
POTASSIUM: 3.5 mmol/L (ref 3.5–5.1)
Sodium: 136 mmol/L (ref 135–145)
Total Bilirubin: 0.5 mg/dL (ref 0.3–1.2)
Total Protein: 7.6 g/dL (ref 6.5–8.1)

## 2016-11-06 LAB — CBC WITH DIFFERENTIAL/PLATELET
BASOS ABS: 0 10*3/uL (ref 0.0–0.1)
Basophils Relative: 0 %
Eosinophils Absolute: 0.1 10*3/uL (ref 0.0–0.7)
Eosinophils Relative: 1 %
HEMATOCRIT: 40.3 % (ref 36.0–46.0)
Hemoglobin: 13.2 g/dL (ref 12.0–15.0)
LYMPHS PCT: 15 %
Lymphs Abs: 1.3 10*3/uL (ref 0.7–4.0)
MCH: 29.5 pg (ref 26.0–34.0)
MCHC: 32.8 g/dL (ref 30.0–36.0)
MCV: 90.2 fL (ref 78.0–100.0)
MONO ABS: 0.8 10*3/uL (ref 0.1–1.0)
Monocytes Relative: 10 %
NEUTROS PCT: 74 %
Neutro Abs: 6.2 10*3/uL (ref 1.7–7.7)
Platelets: 209 10*3/uL (ref 150–400)
RBC: 4.47 MIL/uL (ref 3.87–5.11)
RDW: 14.5 % (ref 11.5–15.5)
WBC: 8.3 10*3/uL (ref 4.0–10.5)

## 2016-11-06 LAB — BRAIN NATRIURETIC PEPTIDE: B NATRIURETIC PEPTIDE 5: 31.8 pg/mL (ref 0.0–100.0)

## 2016-11-06 LAB — INFLUENZA PANEL BY PCR (TYPE A & B)
Influenza A By PCR: POSITIVE — AB
Influenza B By PCR: NEGATIVE

## 2016-11-06 LAB — GLUCOSE, CAPILLARY
GLUCOSE-CAPILLARY: 341 mg/dL — AB (ref 65–99)
Glucose-Capillary: 367 mg/dL — ABNORMAL HIGH (ref 65–99)

## 2016-11-06 MED ORDER — LORATADINE 10 MG PO TABS: 10.0000 mg | ORAL_TABLET | Freq: Every day | ORAL | Status: DC | PRN

## 2016-11-06 MED ORDER — MORPHINE SULFATE (PF) 4 MG/ML IV SOLN
4.0000 mg | Freq: Once | INTRAVENOUS | Status: AC
  Administered 2016-11-06: 4 mg via INTRAVENOUS
  Filled 2016-11-06: qty 1

## 2016-11-06 MED ORDER — ACETAMINOPHEN 325 MG PO TABS
650.0000 mg | ORAL_TABLET | Freq: Four times a day (QID) | ORAL | Status: DC | PRN
  Administered 2016-11-06: 650 mg via ORAL
  Filled 2016-11-06: qty 2

## 2016-11-06 MED ORDER — INSULIN DETEMIR 100 UNIT/ML ~~LOC~~ SOLN
30.0000 [IU] | Freq: Every day | SUBCUTANEOUS | Status: DC
  Administered 2016-11-07 – 2016-11-09 (×3): 30 [IU] via SUBCUTANEOUS
  Filled 2016-11-06 (×3): qty 0.3

## 2016-11-06 MED ORDER — ZOLPIDEM TARTRATE 5 MG PO TABS
5.0000 mg | ORAL_TABLET | Freq: Once | ORAL | Status: AC
  Administered 2016-11-06: 5 mg via ORAL
  Filled 2016-11-06: qty 1

## 2016-11-06 MED ORDER — KETOROLAC TROMETHAMINE 30 MG/ML IJ SOLN
15.0000 mg | Freq: Once | INTRAMUSCULAR | Status: AC
  Administered 2016-11-06: 15 mg via INTRAVENOUS
  Filled 2016-11-06: qty 1

## 2016-11-06 MED ORDER — ALBUTEROL SULFATE (2.5 MG/3ML) 0.083% IN NEBU
5.0000 mg | INHALATION_SOLUTION | Freq: Once | RESPIRATORY_TRACT | Status: AC
  Administered 2016-11-06: 5 mg via RESPIRATORY_TRACT
  Filled 2016-11-06: qty 6

## 2016-11-06 MED ORDER — PANTOPRAZOLE SODIUM 40 MG PO TBEC
40.0000 mg | DELAYED_RELEASE_TABLET | Freq: Two times a day (BID) | ORAL | Status: DC
  Administered 2016-11-06 – 2016-11-09 (×6): 40 mg via ORAL
  Filled 2016-11-06 (×6): qty 1

## 2016-11-06 MED ORDER — OSELTAMIVIR PHOSPHATE 75 MG PO CAPS
75.0000 mg | ORAL_CAPSULE | Freq: Once | ORAL | Status: AC
  Administered 2016-11-06: 75 mg via ORAL
  Filled 2016-11-06: qty 1

## 2016-11-06 MED ORDER — MONTELUKAST SODIUM 10 MG PO TABS
10.0000 mg | ORAL_TABLET | Freq: Every day | ORAL | Status: DC
  Administered 2016-11-06 – 2016-11-08 (×3): 10 mg via ORAL
  Filled 2016-11-06 (×3): qty 1

## 2016-11-06 MED ORDER — METFORMIN HCL ER 500 MG PO TB24
1000.0000 mg | ORAL_TABLET | Freq: Every day | ORAL | Status: DC
  Administered 2016-11-07 – 2016-11-09 (×3): 1000 mg via ORAL
  Filled 2016-11-06 (×3): qty 2

## 2016-11-06 MED ORDER — OXYBUTYNIN CHLORIDE ER 5 MG PO TB24
10.0000 mg | ORAL_TABLET | Freq: Every day | ORAL | Status: DC
  Administered 2016-11-06 – 2016-11-08 (×3): 10 mg via ORAL
  Filled 2016-11-06 (×3): qty 2

## 2016-11-06 MED ORDER — INSULIN DETEMIR 100 UNIT/ML FLEXPEN
30.0000 [IU] | PEN_INJECTOR | Freq: Every day | SUBCUTANEOUS | Status: DC
Start: 2016-11-07 — End: 2016-11-06

## 2016-11-06 MED ORDER — MOMETASONE FURO-FORMOTEROL FUM 100-5 MCG/ACT IN AERO
2.0000 | INHALATION_SPRAY | Freq: Two times a day (BID) | RESPIRATORY_TRACT | Status: DC
  Administered 2016-11-06 – 2016-11-09 (×6): 2 via RESPIRATORY_TRACT
  Filled 2016-11-06: qty 8.8

## 2016-11-06 MED ORDER — ONDANSETRON HCL 4 MG/2ML IJ SOLN
4.0000 mg | Freq: Once | INTRAMUSCULAR | Status: AC
  Administered 2016-11-06: 4 mg via INTRAVENOUS
  Filled 2016-11-06: qty 2

## 2016-11-06 MED ORDER — BENZONATATE 100 MG PO CAPS
100.0000 mg | ORAL_CAPSULE | Freq: Three times a day (TID) | ORAL | Status: DC | PRN
  Administered 2016-11-06 – 2016-11-09 (×9): 100 mg via ORAL
  Filled 2016-11-06 (×9): qty 1

## 2016-11-06 MED ORDER — ATORVASTATIN CALCIUM 10 MG PO TABS
20.0000 mg | ORAL_TABLET | Freq: Every day | ORAL | Status: DC
  Administered 2016-11-06 – 2016-11-08 (×3): 20 mg via ORAL
  Filled 2016-11-06 (×3): qty 2

## 2016-11-06 MED ORDER — SODIUM CHLORIDE 0.9 % IV SOLN
INTRAVENOUS | Status: DC
  Administered 2016-11-06 – 2016-11-07 (×2): via INTRAVENOUS

## 2016-11-06 MED ORDER — HYDROCORTISONE NA SUCCINATE PF 100 MG IJ SOLR
50.0000 mg | Freq: Three times a day (TID) | INTRAMUSCULAR | Status: DC
  Administered 2016-11-06 – 2016-11-07 (×2): 50 mg via INTRAVENOUS
  Filled 2016-11-06 (×3): qty 2

## 2016-11-06 MED ORDER — ACETAMINOPHEN 650 MG RE SUPP: 650.0000 mg | Freq: Four times a day (QID) | RECTAL | Status: DC | PRN

## 2016-11-06 MED ORDER — HEPARIN SODIUM (PORCINE) 5000 UNIT/ML IJ SOLN
5000.0000 [IU] | Freq: Three times a day (TID) | INTRAMUSCULAR | Status: DC
  Administered 2016-11-06 – 2016-11-07 (×2): 5000 [IU] via SUBCUTANEOUS
  Filled 2016-11-06 (×2): qty 1

## 2016-11-06 MED ORDER — INSULIN ASPART 100 UNIT/ML ~~LOC~~ SOLN
5.0000 [IU] | Freq: Once | SUBCUTANEOUS | Status: AC
  Administered 2016-11-06: 5 [IU] via SUBCUTANEOUS

## 2016-11-06 MED ORDER — METHYLPREDNISOLONE SODIUM SUCC 125 MG IJ SOLR
125.0000 mg | Freq: Once | INTRAMUSCULAR | Status: AC
  Administered 2016-11-06: 125 mg via INTRAVENOUS
  Filled 2016-11-06: qty 2

## 2016-11-06 MED ORDER — SODIUM CHLORIDE 0.9 % IV BOLUS (SEPSIS)
1000.0000 mL | Freq: Once | INTRAVENOUS | Status: AC
  Administered 2016-11-06: 1000 mL via INTRAVENOUS

## 2016-11-06 MED ORDER — DM-GUAIFENESIN ER 30-600 MG PO TB12
1.0000 | ORAL_TABLET | Freq: Two times a day (BID) | ORAL | Status: DC
  Administered 2016-11-06 – 2016-11-09 (×6): 1 via ORAL
  Filled 2016-11-06 (×6): qty 1

## 2016-11-06 MED ORDER — KETOROLAC TROMETHAMINE 15 MG/ML IJ SOLN
15.0000 mg | Freq: Four times a day (QID) | INTRAMUSCULAR | Status: DC | PRN
  Administered 2016-11-06 – 2016-11-08 (×2): 15 mg via INTRAVENOUS
  Filled 2016-11-06 (×2): qty 1

## 2016-11-06 MED ORDER — OSELTAMIVIR PHOSPHATE 75 MG PO CAPS
75.0000 mg | ORAL_CAPSULE | Freq: Two times a day (BID) | ORAL | Status: DC
  Administered 2016-11-06 – 2016-11-09 (×6): 75 mg via ORAL
  Filled 2016-11-06 (×6): qty 1

## 2016-11-06 MED ORDER — DM-GUAIFENESIN ER 30-600 MG PO TB12
1.0000 | ORAL_TABLET | Freq: Two times a day (BID) | ORAL | Status: DC | PRN
  Administered 2016-11-06: 1 via ORAL
  Filled 2016-11-06: qty 1

## 2016-11-06 MED ORDER — SITAGLIP PHOS-METFORMIN HCL ER 100-1000 MG PO TB24: 1.0000 | ORAL_TABLET | Freq: Every day | ORAL | Status: DC

## 2016-11-06 MED ORDER — INSULIN ASPART 100 UNIT/ML ~~LOC~~ SOLN
0.0000 [IU] | Freq: Three times a day (TID) | SUBCUTANEOUS | Status: DC
  Administered 2016-11-06: 15 [IU] via SUBCUTANEOUS

## 2016-11-06 MED ORDER — FLUTICASONE PROPIONATE 50 MCG/ACT NA SUSP: 2.0000 | Freq: Every day | NASAL | Status: DC | PRN

## 2016-11-06 MED ORDER — LINAGLIPTIN 5 MG PO TABS
5.0000 mg | ORAL_TABLET | Freq: Every day | ORAL | Status: DC
  Administered 2016-11-07 – 2016-11-09 (×3): 5 mg via ORAL
  Filled 2016-11-06 (×3): qty 1

## 2016-11-06 MED ORDER — VITAMIN C 500 MG PO TABS
500.0000 mg | ORAL_TABLET | Freq: Every day | ORAL | Status: DC
  Administered 2016-11-06 – 2016-11-09 (×4): 500 mg via ORAL
  Filled 2016-11-06 (×3): qty 1

## 2016-11-06 MED ORDER — ASPIRIN EC 81 MG PO TBEC
81.0000 mg | DELAYED_RELEASE_TABLET | Freq: Every day | ORAL | Status: DC
Start: 2016-11-06 — End: 2016-11-09
  Administered 2016-11-06 – 2016-11-09 (×4): 81 mg via ORAL
  Filled 2016-11-06 (×4): qty 1

## 2016-11-06 NOTE — Telephone Encounter (Signed)
Patient Name: [name]  DOB: [birth year]    Initial Comment Caller says, pulmonary patient, all over body aches, productive cough, not sleeping, using Mucinex and Tessalon Perls. She feels very weak and is having trouble getting up. She is a little short of breath.    Nurse Assessment  Nurse: Leilani Merl, RN, Heather Date/Time (Eastern Time): 11/06/2016 7:38:50 AM  Confirm and document reason for call. If symptomatic, describe symptoms. ---Caller says, pulmonary patient, all over body aches, productive cough, not sleeping, using Mucinex and Tessalon Pearls. She feels very weak and is having trouble getting up. She is a little short of breath. This started on Saturday morning  Does the patient have any new or worsening symptoms? ---Yes  Will a triage be completed? ---Yes  Related visit to physician within the last 2 weeks? ---No  Does the PT have any chronic conditions? (i.e. diabetes, asthma, etc.) ---Yes  List chronic conditions. ---See MR  Is the patient pregnant or possibly pregnant? (Ask all females between the ages of 43-55) ---No  Is this a behavioral health or substance abuse call? ---No     Guidelines    Guideline Title Affirmed Question Affirmed Notes  Cough - Acute Productive Patient sounds very sick or weak to the triager    Final Disposition User   Go to ED Now (or PCP triage) Leilani Merl, RN, [place]    Referrals  [facility] - ED   Disagree/Comply: Comply

## 2016-11-06 NOTE — ED Triage Notes (Signed)
Patient from home complaining of low grade fever, nausea, body aches, and non-productive cough.  Patient wears home 02 at 2 liters for an interstitial lung disease.

## 2016-11-06 NOTE — ED Provider Notes (Signed)
[facility] DEPT Provider Note   CSN: [id number] Arrival date & time: 11/06/16  0848     History   Chief Complaint Chief Complaint  Patient presents with  . Flu-like symptoms    HPI NORMAN BIER is a 46 y.o. female.  HPI Patient presents with concern of dyspnea, diffuse discomfort, chest pain. Patient has multiple medical issues including interstitial lung disease. Patient uses oxygen 24/7, 2 L. She notes over the past 30 hours or so she has had increasingly difficulty breathing, as well as worsening diffuse pain, myalgia, arthralgia, and anterior chest pressure. No syncope. There is fever, there is nausea, but no vomiting, no diarrhea. A clear alleviating orogastric factors, the patient notes that any activity makes her feel worse in general.  Past Medical History:  Diagnosis Date  . Allergic rhinitis   . Bronchitis   . Chronic rhinitis   . Cough   . Dyslipidemia   . Dysphagia   . GERD (gastroesophageal reflux disease)   . Headache(784.0)   . ILD (interstitial lung disease) ([place])   . Mild depression ([place])   . Morbid obesity ([place])   . Type II or diabetes mellitus, uncontrolled 11/2012 dx   Dx 12/08/12: a1c 10.0  . Unspecified essential hypertension   . Urge incontinence     Patient Active Problem List   Diagnosis Date Noted  . Skin lesion 10/29/2016  . Cellulitis of right upper extremity 10/29/2016  . Knee pain, left 02/02/2016  . Cough variant asthma 03/09/2015  . Diabetes type 2, uncontrolled ([place]) 12/09/2012  . Dyslipidemia   . Chronic respiratory failure with hypoxia ([place]) 04/05/2012  . CONJUNCTIVITIS, ALLERGIC, SEASONAL 01/05/2010  . Allergic rhinitis 01/05/2010  . Essential hypertension 09/07/2009  . MUSCLE SPASM 09/07/2009  . DEPRESSION, MILD 03/24/2009  . HEADACHE 03/24/2009  . URINARY INCONTINENCE, URGE 03/24/2009  . CHRONIC RHINITIS 12/23/2008  . Severe obesity (BMI >= 40) ([place]) 10/10/2007  . INTERSTITIAL LUNG DISEASE 10/10/2007  .  GASTROESOPHAGEAL REFLUX DISEASE 10/10/2007    Past Surgical History:  Procedure Laterality Date  . BILATERAL VATS ABLATION  02/15/04    OB History    No data available       Home Medications    Prior to Admission medications   Medication Sig Start Date End Date Taking? Authorizing Provider  albuterol (PROVENTIL HFA;VENTOLIN HFA) 108 (90 Base) MCG/ACT inhaler Inhale 2 puffs into the lungs every 6 (six) hours as needed for wheezing or shortness of breath.   Yes Historical Provider, MD  Ascorbic Acid (VITAMIN C PO) Take 1 tablet by mouth daily.    Yes Historical Provider, MD  aspirin EC 81 MG tablet Take 1 tablet (81 mg total) by mouth daily. 09/19/15  Yes Rowe Clack, MD  atorvastatin (LIPITOR) 20 MG tablet TAKE 1 TABLET (20 MG TOTAL) BY MOUTH DAILY AT 6 PM. Patient taking differently: TAKE 1 TABLET (20 MG TOTAL) BY MOUTH DAILY 11/30/15  Yes Rowe Clack, MD  benzonatate (TESSALON) 100 MG capsule Take 100 mg by mouth 3 (three) times daily as needed for cough.  10/10/16  Yes Historical Provider, MD  Blood Glucose Monitoring Suppl (ONE TOUCH ULTRA MINI) W/DEVICE KIT Use as directed to check blood sugar twice a day. Dx 250.00 12/11/12  Yes Rowe Clack, MD  dextromethorphan-guaiFENesin [facility] DM) 30-600 MG 12hr tablet Take 1 tablet by mouth 2 (two) times daily as needed for cough.   Yes Historical Provider, MD  fluticasone (FLONASE) 50 MCG/ACT nasal spray Place 2  sprays into both nostrils daily as needed for allergies or rhinitis.   Yes Historical Provider, MD  glucose blood (ONE TOUCH ULTRA TEST) test strip Use to check blood sugars twice a day. 06/20/16  Yes Binnie Rail, MD  hydrochlorothiazide (MICROZIDE) 12.5 MG capsule Take 1 capsule (12.5 mg total) by mouth daily as needed. Patient taking differently: Take 12.5 mg by mouth daily as needed (For fluid retention.).  09/19/15  Yes Rowe Clack, MD  Insulin Detemir (LEVEMIR FLEXPEN) 100 UNIT/ML Pen Inject 30 Units  into the skin daily. Patient taking differently: Inject 30 Units into the skin at bedtime.  06/27/16  Yes Binnie Rail, MD  Insulin Pen Needle (B-D ULTRAFINE III SHORT PEN) 31G X 8 MM MISC USE NEEDLE AS NEEDED TO INJECTED INSULIN AS PRESCRIBED BY PHYSICIAN 08/16/16  Yes Binnie Rail, MD  IRON PO Take 65 mg by mouth daily.   Yes Historical Provider, MD  loratadine (CLARITIN) 10 MG tablet Take 10 mg by mouth daily as needed (nasal drainage, drip).   Yes Historical Provider, MD  mometasone-formoterol (DULERA) 100-5 MCG/ACT AERO Take 2 puffs first thing in am and then another 2 puffs about 12 hours later. 03/09/15  Yes Tanda Rockers, MD  montelukast (SINGULAIR) 10 MG tablet Take 1 tablet (10 mg total) by mouth at bedtime. 02/02/16  Yes Binnie Rail, MD  ONE TOUCH LANCETS MISC Use as directed to help check blood sugars twice daily. Dx 250.00 12/10/12  Yes Rowe Clack, MD  oxybutynin (DITROPAN XL) 10 MG 24 hr tablet Take 1 tablet (10 mg total) by mouth at bedtime. Patient taking differently: Take 10 mg by mouth at bedtime as needed (For overactive bladder.).  09/19/15  Yes Rowe Clack, MD  OXYGEN-HELIUM IN Inhale 2-3 L into the lungs daily.    Yes Historical Provider, MD  pantoprazole (PROTONIX) 40 MG tablet Take 1 tablet (40 mg total) by mouth 2 (two) times daily before a meal. 08/31/16  Yes Tanda Rockers, MD  predniSONE (DELTASONE) 10 MG tablet TAKE 1 TABLET BY MOUTH DAILY Patient taking differently: TAKE ONE TABLET BY MOUTH DAILY 09/27/16  Yes Tanda Rockers, MD  SitaGLIPtin-MetFORMIN HCl (JANUMET XR) 6802846635 MG TB24 Take 1 tablet by mouth daily. 09/19/15  Yes Rowe Clack, MD  TURMERIC PO Take 1 capsule by mouth daily.    Yes Historical Provider, MD  Clobetasol Prop Emollient Base (CLOBETASOL PROPIONATE E) 0.05 % emollient cream Apply 1 application topically 2 (two) times daily. Patient not taking: Reported on 11/06/2016 10/29/16   Binnie Rail, MD  doxycycline (VIBRA-TABS) 100 MG  tablet Take 1 tablet (100 mg total) by mouth 2 (two) times daily. Patient not taking: Reported on 11/06/2016 10/29/16   Binnie Rail, MD    Family History Family History  Problem Relation Age of Onset  . Sarcoidosis Mother     dxed by transbrochial bx  . Allergies Mother   . Heart disease Father   . Diabetes Father   . Allergies Father   . Coronary artery disease Father   . Cancer Maternal Grandmother     CA of unknown type  . Cancer Paternal Grandmother     CA of unknown type    Social History Social History  Substance Use Topics  . Smoking status: Never Smoker  . Smokeless tobacco: Never Used     Comment: {  . Alcohol use No     Allergies   Penicillins and  Peach flavor   Review of Systems Review of Systems  Constitutional:       Per HPI, otherwise negative  HENT:       Per HPI, otherwise negative  Respiratory:       Per HPI, otherwise negative  Cardiovascular:       Per HPI, otherwise negative  Gastrointestinal: Negative for vomiting.  Endocrine:       Negative aside from HPI  Genitourinary:       Neg aside from HPI   Musculoskeletal:       Per HPI, otherwise negative  Skin: Negative.   Allergic/Immunologic: Positive for immunocompromised state.       Chronic steroids  Neurological: Negative for syncope.     Physical Exam Updated Vital Signs BP 143/93 (BP Location: Right Arm)   Pulse (!) 127   Temp 99.8 F (37.7 C) (Oral)   Resp 18   SpO2 100%   Physical Exam  Constitutional: She is oriented to person, place, and time. She appears distressed.  Obese F speaking in quiet breath sounds  HENT:  Head: Normocephalic and atraumatic.  Eyes: Conjunctivae and EOM are normal.  Cardiovascular: Regular rhythm.  Tachycardia present.   Pulmonary/Chest: No stridor. Tachypnea noted. She is in respiratory distress. She has decreased breath sounds. She has wheezes.  Abdominal: She exhibits no distension.  Musculoskeletal: She exhibits no edema.  Neurological:  She is alert and oriented to person, place, and time. No cranial nerve deficit.  Skin: Skin is warm. She is diaphoretic.  Psychiatric: Her mood appears anxious.  Nursing note and vitals reviewed.    ED Treatments / Results  Labs (all labs ordered are listed, but only abnormal results are displayed) Labs Reviewed  COMPREHENSIVE METABOLIC PANEL - Abnormal; Notable for the following:       Result Value   Chloride 96 (*)    Glucose, Bld 179 (*)    Calcium 8.8 (*)    All other components within normal limits  INFLUENZA PANEL BY PCR (TYPE A & B) - Abnormal; Notable for the following:    Influenza A By PCR POSITIVE (*)    All other components within normal limits  BRAIN NATRIURETIC PEPTIDE  CBC WITH DIFFERENTIAL/PLATELET    EKG  EKG Interpretation  Date/Time:  Tuesday November 06 2016 09:56:29 EST Ventricular Rate:  121 PR Interval:    QRS Duration: 81 QT Interval:  287 QTC Calculation: 408 R Axis:   15 Text Interpretation:  Sinus tachycardia Low voltage, precordial leads T wave abnormality Abnormal ekg Confirmed by Carmin Muskrat  MD ([id number]) on 11/06/2016 10:05:35 AM       Radiology Dg Chest 2 View  Result Date: 11/06/2016 CLINICAL DATA:  Fever and chills.  Cough. EXAM: CHEST  2 VIEW COMPARISON:  Chest radiograph October 06, 2013 and chest CT October 09, 2016 FINDINGS: There is atelectatic change in the right lung base. There is no frank edema or consolidation. Heart is borderline enlarged with pulmonary vascularity within normal limits. No adenopathy. No bone lesions. There is degenerative change in the lower thoracic region. IMPRESSION: Atelectatic change right base. No edema or consolidation. Stable cardiac silhouette. Electronically Signed   By: Lowella Grip III M.D.   On: 11/06/2016 11:33    Procedures Procedures (including critical care time)  Medications Ordered in ED Medications  methylPREDNISolone sodium succinate (SOLU-MEDROL) 125 mg/2 mL injection 125 mg (125  mg Intravenous Given 11/06/16 1010)  albuterol (PROVENTIL) (2.5 MG/3ML) 0.083% nebulizer solution 5 mg (5  mg Nebulization Given 11/06/16 0959)  morphine 4 MG/ML injection 4 mg (4 mg Intravenous Given 11/06/16 1011)  sodium chloride 0.9 % bolus 1,000 mL (0 mLs Intravenous Stopped 11/06/16 1137)  ketorolac (TORADOL) 30 MG/ML injection 15 mg (15 mg Intravenous Given 11/06/16 1010)  ondansetron (ZOFRAN) injection 4 mg (4 mg Intravenous Given 11/06/16 1138)  oseltamivir (TAMIFLU) capsule 75 mg (75 mg Oral Given 11/06/16 1437)  albuterol (PROVENTIL) (2.5 MG/3ML) 0.083% nebulizer solution 5 mg (5 mg Nebulization Given 11/06/16 1437)  morphine 4 MG/ML injection 4 mg (4 mg Intravenous Given 11/06/16 1436)     Initial Impression / Assessment and Plan / ED Course  I have reviewed the triage vital signs and the nursing notes.  Pertinent labs & imaging results that were available during my care of the patient were reviewed by me and considered in my medical decision making (see chart for details).  2:40 PM Following initial albuterol/the patient had some reduction in wheezing, dyspnea. However, the patient notes recurrence of similar symptoms. , Now with all results available, I discussed all findings with her and her husband. With concern for influenza given the patient's baseline immune compromised state, home prednisone use, the patient will be started on Tamiflu, be admitted for further evaluation and management.   Final Clinical Impressions(s) / ED Diagnoses   Final diagnoses:  Influenza     Carmin Muskrat, MD 11/06/16 1441

## 2016-11-06 NOTE — H&P (Signed)
TRH H&P   Patient Demographics:    [name], is a 46 y.o. female  MRN: [id number]   DOB - 06-25-1971  Admit Date - 11/06/2016  Outpatient Primary MD for the patient is Binnie Rail, MD  Referring MD/NP/PA: Dr Vanita Panda  Outpatient specialist:  Pulm Dr wert  Patient coming from: Home  Chief Complaint  Patient presents with  . Flu-like symptoms      HPI:    [name]  is a 46 y.o. female, With past medical history of interstitial lung disease on chronic 2 L nasal cannula, followed by Dr. Melvyn Novas, hypertension, diabetes mellitus, hyperlipidemia, morbid obesity, she presents with flulike complaints, reports generalized body ache over last 48 hours, worsening dyspnea, productive cough, as any fever or chills, but temperature 99.1 in ED, denies any chest pain, hemoptysis, nausea, vomiting or diarrhea, patient was found to be tachycardic in ED, despite multiple fluid boluses, with some wheezing, but improved with Solu-Medrol and nebulizer treatment , tested positive for influenza, started on Tamiflu, and I was called to admit.    Review of systems:    In addition to the HPI above, No Fever-chills, No Headache, No changes with Vision or hearing, No problems swallowing food or Liquids, No Chest pain, Reports reductive cough with worsening shortness of breath No Abdominal pain, No Nausea or Vommitting, Bowel movements are regular, No Blood in stool or Urine, No dysuria, No new skin rashes or bruises, No new joints pains-aches, reports significant body ache she reports she is aching from head to toe  No new weakness, tingling, numbness in any extremity, report generalized body weakness and fatigue No recent weight gain or loss, No polyuria, polydypsia or polyphagia, No significant Mental Stressors.  A full 10 point Review of Systems was done, except as stated above, all other Review  of Systems were negative.   With Past History of the following :    Past Medical History:  Diagnosis Date  . Allergic rhinitis   . Bronchitis   . Chronic rhinitis   . Cough   . Dyslipidemia   . Dysphagia   . GERD (gastroesophageal reflux disease)   . Headache(784.0)   . ILD (interstitial lung disease) ([place])   . Mild depression ([place])   . Morbid obesity ([place])   . Type II or diabetes mellitus, uncontrolled 11/2012 dx   Dx 12/08/12: a1c 10.0  . Unspecified essential hypertension   . Urge incontinence       Past Surgical History:  Procedure Laterality Date  . BILATERAL VATS ABLATION  02/15/04      Social History:     Social History  Substance Use Topics  . Smoking status: Never Smoker  . Smokeless tobacco: Never Used     Comment: {  . Alcohol use No     Lives - At home  Mobility - independent     Family  History :     Family History  Problem Relation Age of Onset  . Sarcoidosis Mother     dxed by transbrochial bx  . Allergies Mother   . Heart disease Father   . Diabetes Father   . Allergies Father   . Coronary artery disease Father   . Cancer Maternal Grandmother     CA of unknown type  . Cancer Paternal Grandmother     CA of unknown type      Home Medications:   Prior to Admission medications   Medication Sig Start Date End Date Taking? Authorizing Provider  albuterol (PROVENTIL HFA;VENTOLIN HFA) 108 (90 Base) MCG/ACT inhaler Inhale 2 puffs into the lungs every 6 (six) hours as needed for wheezing or shortness of breath.   Yes Historical Provider, MD  Ascorbic Acid (VITAMIN C PO) Take 1 tablet by mouth daily.    Yes Historical Provider, MD  aspirin EC 81 MG tablet Take 1 tablet (81 mg total) by mouth daily. 09/19/15  Yes Rowe Clack, MD  atorvastatin (LIPITOR) 20 MG tablet TAKE 1 TABLET (20 MG TOTAL) BY MOUTH DAILY AT 6 PM. Patient taking differently: TAKE 1 TABLET (20 MG TOTAL) BY MOUTH DAILY 11/30/15  Yes Rowe Clack, MD  benzonatate  (TESSALON) 100 MG capsule Take 100 mg by mouth 3 (three) times daily as needed for cough.  10/10/16  Yes Historical Provider, MD  Blood Glucose Monitoring Suppl (ONE TOUCH ULTRA MINI) W/DEVICE KIT Use as directed to check blood sugar twice a day. Dx 250.00 12/11/12  Yes Rowe Clack, MD  dextromethorphan-guaiFENesin [facility] DM) 30-600 MG 12hr tablet Take 1 tablet by mouth 2 (two) times daily as needed for cough.   Yes Historical Provider, MD  fluticasone (FLONASE) 50 MCG/ACT nasal spray Place 2 sprays into both nostrils daily as needed for allergies or rhinitis.   Yes Historical Provider, MD  glucose blood (ONE TOUCH ULTRA TEST) test strip Use to check blood sugars twice a day. 06/20/16  Yes Binnie Rail, MD  hydrochlorothiazide (MICROZIDE) 12.5 MG capsule Take 1 capsule (12.5 mg total) by mouth daily as needed. Patient taking differently: Take 12.5 mg by mouth daily as needed (For fluid retention.).  09/19/15  Yes Rowe Clack, MD  Insulin Detemir (LEVEMIR FLEXPEN) 100 UNIT/ML Pen Inject 30 Units into the skin daily. Patient taking differently: Inject 30 Units into the skin at bedtime.  06/27/16  Yes Binnie Rail, MD  Insulin Pen Needle (B-D ULTRAFINE III SHORT PEN) 31G X 8 MM MISC USE NEEDLE AS NEEDED TO INJECTED INSULIN AS PRESCRIBED BY PHYSICIAN 08/16/16  Yes Binnie Rail, MD  IRON PO Take 65 mg by mouth daily.   Yes Historical Provider, MD  loratadine (CLARITIN) 10 MG tablet Take 10 mg by mouth daily as needed (nasal drainage, drip).   Yes Historical Provider, MD  mometasone-formoterol (DULERA) 100-5 MCG/ACT AERO Take 2 puffs first thing in am and then another 2 puffs about 12 hours later. 03/09/15  Yes Tanda Rockers, MD  montelukast (SINGULAIR) 10 MG tablet Take 1 tablet (10 mg total) by mouth at bedtime. 02/02/16  Yes Binnie Rail, MD  ONE TOUCH LANCETS MISC Use as directed to help check blood sugars twice daily. Dx 250.00 12/10/12  Yes Rowe Clack, MD  oxybutynin (DITROPAN XL)  10 MG 24 hr tablet Take 1 tablet (10 mg total) by mouth at bedtime. Patient taking differently: Take 10 mg by mouth at bedtime as  needed (For overactive bladder.).  09/19/15  Yes Rowe Clack, MD  OXYGEN-HELIUM IN Inhale 2-3 L into the lungs daily.    Yes Historical Provider, MD  pantoprazole (PROTONIX) 40 MG tablet Take 1 tablet (40 mg total) by mouth 2 (two) times daily before a meal. 08/31/16  Yes Tanda Rockers, MD  predniSONE (DELTASONE) 10 MG tablet TAKE 1 TABLET BY MOUTH DAILY Patient taking differently: TAKE ONE TABLET BY MOUTH DAILY 09/27/16  Yes Tanda Rockers, MD  SitaGLIPtin-MetFORMIN HCl (JANUMET XR) [phone] MG TB24 Take 1 tablet by mouth daily. 09/19/15  Yes Rowe Clack, MD  TURMERIC PO Take 1 capsule by mouth daily.    Yes Historical Provider, MD  Clobetasol Prop Emollient Base (CLOBETASOL PROPIONATE E) 0.05 % emollient cream Apply 1 application topically 2 (two) times daily. Patient not taking: Reported on 11/06/2016 10/29/16   Binnie Rail, MD  doxycycline (VIBRA-TABS) 100 MG tablet Take 1 tablet (100 mg total) by mouth 2 (two) times daily. Patient not taking: Reported on 11/06/2016 10/29/16   Binnie Rail, MD     Allergies:     Allergies  Allergen Reactions  . Penicillins Shortness Of Breath, Swelling and Other (See Comments)    Has patient had a PCN reaction causing immediate rash, facial/tongue/throat swelling, SOB or lightheadedness with hypotension: yes Has patient had a PCN reaction causing severe rash involving mucus membranes or skin necrosis: no Has patient had a PCN reaction that required hospitalization no Has patient had a PCN reaction occurring within the last 10 years: no If all of the above answers are "NO", then may proceed with Cephalosporin use.   Marland Kitchen Peach Flavor Hives and Other (See Comments)    Fresh peaches only     Physical Exam:   Vitals  Blood pressure 134/88, pulse 111, temperature 98.5 F (36.9 C), temperature source Oral, resp.  rate 24, SpO2 99 %.   1. General Morbidly obese female lying in bed appears uncomfortable, mildly tachypneic with mild respiratory distress  2. Normal affect and insight, Not Suicidal or Homicidal, Awake Alert, Oriented X 3.  3. No F.N deficits, ALL C.Nerves Intact, Strength 5/5 all 4 extremities, Sensation intact all 4 extremities, Plantars down going.  4. Ears and Eyes appear Normal, Conjunctivae clear, PERRLA. Moist Oral Mucosa.  5. Supple Neck, No JVD, No cervical lymphadenopathy appriciated, No Carotid Bruits.  6. Symmetrical Chest wall movement, diminished air entry bilaterally, scattered wheezing  7. Tachycardic, No Gallops, Rubs or Murmurs, No Parasternal Heave.  8. Positive Bowel Sounds, Abdomen Soft, No tenderness, No organomegaly appriciated,No rebound -guarding or rigidity.  9.  No Cyanosis, Normal Skin Turgor, No Skin Rash or Bruise.  10. Good muscle tone,  joints appear normal , no effusions, Normal ROM.  11. No Palpable Lymph Nodes in Neck or Axillae     Data Review:    CBC  Recent Labs Lab 11/06/16 1014  WBC 8.3  HGB 13.2  HCT 40.3  PLT 209  MCV 90.2  MCH 29.5  MCHC 32.8  RDW 14.5  LYMPHSABS 1.3  MONOABS 0.8  EOSABS 0.1  BASOSABS 0.0   ------------------------------------------------------------------------------------------------------------------  Chemistries   Recent Labs Lab 11/06/16 1014  NA 136  K 3.5  CL 96*  CO2 30  GLUCOSE 179*  BUN 8  CREATININE 0.57  CALCIUM 8.8*  AST 20  ALT 21  ALKPHOS 67  BILITOT 0.5   ------------------------------------------------------------------------------------------------------------------ estimated creatinine clearance is 109.7 mL/min (by C-G formula based on SCr  of 0.57 mg/dL). ------------------------------------------------------------------------------------------------------------------ No results for input(s): TSH, T4TOTAL, T3FREE, THYROIDAB in the last 72 hours.  Invalid input(s):  FREET3  Coagulation profile No results for input(s): INR, PROTIME in the last 168 hours. ------------------------------------------------------------------------------------------------------------------- No results for input(s): DDIMER in the last 72 hours. -------------------------------------------------------------------------------------------------------------------  Cardiac Enzymes No results for input(s): CKMB, TROPONINI, MYOGLOBIN in the last 168 hours.  Invalid input(s): CK ------------------------------------------------------------------------------------------------------------------    Component Value Date/Time   BNP 31.8 11/06/2016 1014     ---------------------------------------------------------------------------------------------------------------  Urinalysis    Component Value Date/Time   COLORURINE YELLOW 04/26/2014 1048   APPEARANCEUR CLEAR 04/26/2014 1048   LABSPEC 1.015 04/26/2014 1048   PHURINE 6.0 04/26/2014 1048   GLUCOSEU >=1000 (A) 04/26/2014 1048   HGBUR NEGATIVE 04/26/2014 1048   BILIRUBINUR NEGATIVE 04/26/2014 1048   KETONESUR NEGATIVE 04/26/2014 1048   PROTEINUR NEGATIVE 08/02/2007 1259   UROBILINOGEN 0.2 04/26/2014 1048   NITRITE NEGATIVE 04/26/2014 1048   LEUKOCYTESUR NEGATIVE 04/26/2014 1048    ----------------------------------------------------------------------------------------------------------------   Imaging Results:    Dg Chest 2 View  Result Date: 11/06/2016 CLINICAL DATA:  Fever and chills.  Cough. EXAM: CHEST  2 VIEW COMPARISON:  Chest radiograph October 06, 2013 and chest CT October 09, 2016 FINDINGS: There is atelectatic change in the right lung base. There is no frank edema or consolidation. Heart is borderline enlarged with pulmonary vascularity within normal limits. No adenopathy. No bone lesions. There is degenerative change in the lower thoracic region. IMPRESSION: Atelectatic change right base. No edema or consolidation.  Stable cardiac silhouette. Electronically Signed   By: Lowella Grip III M.D.   On: 11/06/2016 11:33    My personal review of EKG: Rhythm NSR, Rate  121 /min, QTc 408 , no Acute ST changes   Assessment & Plan:    Active Problems:   Severe obesity (BMI >= 40) (HCC)   Essential hypertension   INTERSTITIAL LUNG DISEASE   GASTROESOPHAGEAL REFLUX DISEASE   Chronic respiratory failure (HCC)   Diabetes type 2, uncontrolled (HCC)   Dyslipidemia   Influenza   Influenza with respiratory manifestation - Patient is influenza positive, given her immunocompromised and chronic lung disease, she will be admitted admitted to inpatient status, specially with persistent tachycardia despite fluid boluses. - Continue with IV fluids, she is steroid dependent, so will start on stress dose hydrocortisone, and started on Tamiflu  Hypertension - Blood pressure is acceptable, will hold hydrochlorothiazide, if blood pressure is acceptable tomorrow, will resume  Diabetes mellitus - Continue home medication including Janumet, will start an insulin sliding scale as she is on steroids  Interstitial lung disease - She is followed by Dr. Melvyn Novas , continue with home medication including Doppler, Singulair, Flonase, she is a steroid dependent, currently on hydrocortisone, we'll resume on by mouth prednisone when stable  Steroid dependent - On prednisone 10 mg oral daily, currently on stress dose hydrocortisone  Dyslipidemia - Continue with statin  Chronic respiratory failure - At baseline 2 L nasal cannula  GERD - Continue with PPI    DVT Prophylaxis Heparin - SCDs  AM Labs Ordered, also please review Full Orders  Family Communication: Admission, patients condition and plan of care including tests being ordered have been discussed with the patient and mother who indicate understanding and agree with the plan and Code Status.  Code Status Full  Likely DC to Home  Condition GUARDED    Consults  called: None  Admission status: inpatient  Time spent in minutes : 55 minutes  [facility], DAWOOD M.D on 11/06/2016 at 3:53 PM  Between 7am to 7pm - Pager - [phone]. After 7pm go to [email or web address] - password [facility]  [facility] - Office  [phone]

## 2016-11-06 NOTE — ED Notes (Signed)
Bed: WA07 Expected date:  Expected time:  Means of arrival:  Comments: EMS Flu

## 2016-11-07 DIAGNOSIS — J8410 Pulmonary fibrosis, unspecified: Secondary | ICD-10-CM

## 2016-11-07 DIAGNOSIS — J9611 Chronic respiratory failure with hypoxia: Secondary | ICD-10-CM

## 2016-11-07 DIAGNOSIS — E1165 Type 2 diabetes mellitus with hyperglycemia: Secondary | ICD-10-CM

## 2016-11-07 DIAGNOSIS — J111 Influenza due to unidentified influenza virus with other respiratory manifestations: Secondary | ICD-10-CM

## 2016-11-07 DIAGNOSIS — I10 Essential (primary) hypertension: Secondary | ICD-10-CM

## 2016-11-07 DIAGNOSIS — Z794 Long term (current) use of insulin: Secondary | ICD-10-CM

## 2016-11-07 LAB — BASIC METABOLIC PANEL
ANION GAP: 11 (ref 5–15)
BUN: 12 mg/dL (ref 6–20)
CHLORIDE: 103 mmol/L (ref 101–111)
CO2: 24 mmol/L (ref 22–32)
Calcium: 8.7 mg/dL — ABNORMAL LOW (ref 8.9–10.3)
Creatinine, Ser: 0.6 mg/dL (ref 0.44–1.00)
GFR calc non Af Amer: >60 mL/min (ref >60)
Glucose, Bld: 259 mg/dL — ABNORMAL HIGH (ref 65–99)
POTASSIUM: 4.9 mmol/L (ref 3.5–5.1)
SODIUM: 138 mmol/L (ref 135–145)

## 2016-11-07 LAB — CBC
HEMATOCRIT: 38.9 % (ref 36.0–46.0)
HEMOGLOBIN: 12.6 g/dL (ref 12.0–15.0)
MCH: 28.8 pg (ref 26.0–34.0)
MCHC: 32.4 g/dL (ref 30.0–36.0)
MCV: 89 fL (ref 78.0–100.0)
Platelets: 201 10*3/uL (ref 150–400)
RBC: 4.37 MIL/uL (ref 3.87–5.11)
RDW: 14.5 % (ref 11.5–15.5)
WBC: 6.7 10*3/uL (ref 4.0–10.5)

## 2016-11-07 LAB — GLUCOSE, CAPILLARY
GLUCOSE-CAPILLARY: 264 mg/dL — AB (ref 65–99)
GLUCOSE-CAPILLARY: 287 mg/dL — AB (ref 65–99)
Glucose-Capillary: 261 mg/dL — ABNORMAL HIGH (ref 65–99)
Glucose-Capillary: 241 mg/dL — ABNORMAL HIGH (ref 65–99)

## 2016-11-07 MED ORDER — INSULIN ASPART 100 UNIT/ML ~~LOC~~ SOLN
0.0000 [IU] | Freq: Every day | SUBCUTANEOUS | Status: DC
  Administered 2016-11-07 – 2016-11-08 (×2): 3 [IU] via SUBCUTANEOUS

## 2016-11-07 MED ORDER — ZOLPIDEM TARTRATE 5 MG PO TABS
5.0000 mg | ORAL_TABLET | Freq: Every evening | ORAL | Status: DC | PRN
  Administered 2016-11-07: 5 mg via ORAL
  Filled 2016-11-07: qty 1

## 2016-11-07 MED ORDER — MENTHOL 3 MG MT LOZG
1.0000 | LOZENGE | OROMUCOSAL | Status: DC | PRN
  Filled 2016-11-07: qty 9

## 2016-11-07 MED ORDER — HYDROCHLOROTHIAZIDE 12.5 MG PO CAPS
12.5000 mg | ORAL_CAPSULE | Freq: Every day | ORAL | Status: DC
  Administered 2016-11-07 – 2016-11-09 (×3): 12.5 mg via ORAL
  Filled 2016-11-07 (×2): qty 1

## 2016-11-07 MED ORDER — IPRATROPIUM-ALBUTEROL 0.5-2.5 (3) MG/3ML IN SOLN: 3.0000 mL | Freq: Two times a day (BID) | RESPIRATORY_TRACT | Status: DC

## 2016-11-07 MED ORDER — ALBUTEROL SULFATE (2.5 MG/3ML) 0.083% IN NEBU
INHALATION_SOLUTION | RESPIRATORY_TRACT | Status: AC
  Administered 2016-11-07: 2.5 mg
  Filled 2016-11-07: qty 3

## 2016-11-07 MED ORDER — PHENOL 1.4 % MT LIQD
1.0000 | OROMUCOSAL | Status: DC | PRN
  Filled 2016-11-07: qty 177

## 2016-11-07 MED ORDER — METHYLPREDNISOLONE SODIUM SUCC 40 MG IJ SOLR
40.0000 mg | Freq: Two times a day (BID) | INTRAMUSCULAR | Status: DC
  Administered 2016-11-07 (×2): 40 mg via INTRAVENOUS
  Filled 2016-11-07 (×2): qty 1

## 2016-11-07 MED ORDER — ALBUTEROL SULFATE (2.5 MG/3ML) 0.083% IN NEBU
2.5000 mg | INHALATION_SOLUTION | RESPIRATORY_TRACT | Status: DC | PRN
  Administered 2016-11-07 – 2016-11-08 (×2): 2.5 mg via RESPIRATORY_TRACT
  Filled 2016-11-07 (×2): qty 3

## 2016-11-07 MED ORDER — IPRATROPIUM-ALBUTEROL 0.5-2.5 (3) MG/3ML IN SOLN: 3.0000 mL | Freq: Four times a day (QID) | RESPIRATORY_TRACT | Status: DC | PRN

## 2016-11-07 MED ORDER — GUAIFENESIN-DM 100-10 MG/5ML PO SYRP
5.0000 mL | ORAL_SOLUTION | ORAL | Status: DC | PRN
  Administered 2016-11-07 – 2016-11-08 (×5): 5 mL via ORAL
  Filled 2016-11-07 (×6): qty 10

## 2016-11-07 MED ORDER — ENOXAPARIN SODIUM 60 MG/0.6ML ~~LOC~~ SOLN
60.0000 mg | SUBCUTANEOUS | Status: DC
  Administered 2016-11-07: 18:00:00 60 mg via SUBCUTANEOUS
  Filled 2016-11-07: qty 0.6

## 2016-11-07 MED ORDER — INSULIN ASPART 100 UNIT/ML ~~LOC~~ SOLN
0.0000 [IU] | Freq: Three times a day (TID) | SUBCUTANEOUS | Status: DC
  Administered 2016-11-07 (×2): 11 [IU] via SUBCUTANEOUS
  Administered 2016-11-07: 7 [IU] via SUBCUTANEOUS
  Administered 2016-11-08 (×3): 11 [IU] via SUBCUTANEOUS
  Administered 2016-11-09: 3 [IU] via SUBCUTANEOUS

## 2016-11-07 NOTE — Progress Notes (Addendum)
PROGRESS NOTE  [name]  [id number] DOB: 1971-02-02 DOA: 11/06/2016 PCP: Binnie Rail, MD  Brief Narrative:    [name]  is a 46 y.o. female, with past medical history of interstitial lung disease on chronic 2 L nasal cannula, followed by Dr. Melvyn Novas, hypertension, diabetes mellitus, hyperlipidemia, morbid obesity presented with acute on chronic respiratory failure with hypoxia due to influenza A.  Tachycardia improved with treatment of respiratory distress and IV fluids.  Ongoing wheeze, cough, and dyspnea at rest but improved since yesterday with tamiflu, steroids, and nebulizer treatments.   Assessment & Plan:   Active Problems:   Severe obesity (BMI >= 40) (HCC)   Essential hypertension   INTERSTITIAL LUNG DISEASE   GASTROESOPHAGEAL REFLUX DISEASE   Chronic respiratory failure (HCC)   Diabetes type 2, uncontrolled (HCC)   Dyslipidemia   Influenza  Acute on chronic respiratory failure with hypoxia secondary to influenza a superimposed on interstitial lung disease  -  Attempted ambulation today: Oxygen saturations decreased to the mid 70s with respiratory distress requiring her to be wheeled back to her room -  Decrease to Solu-Medrol 40 mg IV twice a day -  Continue Tamiflu -  Continue nebulizer treatments  Sinus tachycardia -  Tele:  Sinus tachycardia resolving -  Okay to d/c telemetry  Hypertension, BP slightly elevated - resume hydrochlorothiazide  Diabetes mellitus, hyperglycemia - Continue home medication including Janumet - increase to moderate dose sliding scale insulin  - taper steroids  Interstitial lung disease - She is followed by Dr. Melvyn Novas, continue with home medication including Dulera, Singulair, Flonase - Steroid dependent, taper back to prednisone 10mg  daily eventually  Steroid dependent - change to IV solumedrol  Dyslipidemia - Continue with statin  Acute on chronic respiratory failure - At baseline 2 L nasal cannula at rest -  hypoxic with exertion to mid 70s  GERD - Continue with PPI  DVT prophylaxis:  lovenox Code Status:  full Family Communication:  Patient alone Disposition Plan:  Pending able to perform basic ADLs without severe dyspnea and hypoxia.  Will reassess ambulatory pulse ox again tomorrow   Consultants:   none  Procedures:  none  Antimicrobials:  Anti-infectives    Start     Dose/Rate Route Frequency Ordered Stop   11/06/16 2209  oseltamivir (TAMIFLU) capsule 75 mg     75 mg Oral 2 times daily 11/06/16 1509 11/11/16 0959   11/06/16 1430  oseltamivir (TAMIFLU) capsule 75 mg     75 mg Oral Once 11/06/16 1417 11/06/16 1437       Subjective:  Feels better than yesterday, but still very fatigued, coughing frequently, wheezing (which is not normal) and [name] of breath with talking  Objective: Vitals:   11/06/16 2042 11/07/16 0525 11/07/16 0850 11/07/16 0851  BP: 132/77 125/89    Pulse: 74 77    Resp: 20 18    Temp: 97.9 F (36.6 C) 97.8 F (36.6 C)    TempSrc: Oral Oral    SpO2: 100% 100% 99% 99%  Weight:      Height:        Intake/Output Summary (Last 24 hours) at 11/07/16 1406 Last data filed at 11/07/16 1000  Gross per 24 hour  Intake          1433.75 ml  Output                0 ml  Net          1433.75 ml  Filed Weights   11/06/16 1635  Weight: 127.5 kg (281 lb 1.6 oz)    Examination:  General exam:  Adult female.  No acute distress.  Coughs during interview HEENT:  NCAT, MMM Respiratory system:  Diminished bilateral breath sounds, high pitched expiratory wheeze, scattered rales throughout Cardiovascular system: Regular rate and rhythm, normal S1/S2. No murmurs, rubs, gallops or clicks.  Warm extremities Gastrointestinal system: Normal active bowel sounds, soft, nondistended, nontender. MSK:  Normal tone and bulk, no lower extremity edema Neuro:  Grossly intact    Data Reviewed: I have personally reviewed following labs and imaging  studies  CBC:  Recent Labs Lab 11/06/16 1014 11/07/16 0531  WBC 8.3 6.7  NEUTROABS 6.2  --   HGB 13.2 12.6  HCT 40.3 38.9  MCV 90.2 89.0  PLT 209 [id number]   Basic Metabolic Panel:  Recent Labs Lab 11/06/16 1014 11/07/16 0531  NA 136 138  K 3.5 4.9  CL 96* 103  CO2 30 24  GLUCOSE 179* 259*  BUN 8 12  CREATININE 0.57 0.60  CALCIUM 8.8* 8.7*   GFR: Estimated Creatinine Clearance: 110.9 mL/min (by C-G formula based on SCr of 0.6 mg/dL). Liver Function Tests:  Recent Labs Lab 11/06/16 1014  AST 20  ALT 21  ALKPHOS 67  BILITOT 0.5  PROT 7.6  ALBUMIN 3.7   No results for input(s): LIPASE, AMYLASE in the last 168 hours. No results for input(s): AMMONIA in the last 168 hours. Coagulation Profile: No results for input(s): INR, PROTIME in the last 168 hours. Cardiac Enzymes: No results for input(s): CKTOTAL, CKMB, CKMBINDEX, TROPONINI in the last 168 hours. BNP (last 3 results) No results for input(s): PROBNP in the last 8760 hours. HbA1C: No results for input(s): HGBA1C in the last 72 hours. CBG:  Recent Labs Lab 11/06/16 1708 11/06/16 2046 11/07/16 0722 11/07/16 1204  GLUCAP 367* 341* 261* 241*   Lipid Profile: No results for input(s): CHOL, HDL, LDLCALC, TRIG, CHOLHDL, LDLDIRECT in the last 72 hours. Thyroid Function Tests: No results for input(s): TSH, T4TOTAL, FREET4, T3FREE, THYROIDAB in the last 72 hours. Anemia Panel: No results for input(s): VITAMINB12, FOLATE, FERRITIN, TIBC, IRON, RETICCTPCT in the last 72 hours. Urine analysis:    Component Value Date/Time   COLORURINE YELLOW 04/26/2014 1048   APPEARANCEUR CLEAR 04/26/2014 1048   LABSPEC 1.015 04/26/2014 1048   PHURINE 6.0 04/26/2014 1048   GLUCOSEU >=1000 (A) 04/26/2014 1048   HGBUR NEGATIVE 04/26/2014 1048   BILIRUBINUR NEGATIVE 04/26/2014 1048   KETONESUR NEGATIVE 04/26/2014 1048   PROTEINUR NEGATIVE 08/02/2007 1259   UROBILINOGEN 0.2 04/26/2014 1048   NITRITE NEGATIVE 04/26/2014 1048    LEUKOCYTESUR NEGATIVE 04/26/2014 1048   Sepsis Labs: @LABRCNTIP (procalcitonin:4,lacticidven:4)  )No results found for this or any previous visit (from the past 240 hour(s)).    Radiology Studies: Dg Chest 2 View  Result Date: 11/06/2016 CLINICAL DATA:  Fever and chills.  Cough. EXAM: CHEST  2 VIEW COMPARISON:  Chest radiograph October 06, 2013 and chest CT October 09, 2016 FINDINGS: There is atelectatic change in the right lung base. There is no frank edema or consolidation. Heart is borderline enlarged with pulmonary vascularity within normal limits. No adenopathy. No bone lesions. There is degenerative change in the lower thoracic region. IMPRESSION: Atelectatic change right base. No edema or consolidation. Stable cardiac silhouette. Electronically Signed   By: Lowella Grip III M.D.   On: 11/06/2016 11:33     Scheduled Meds: . aspirin EC  81 mg Oral  Daily  . atorvastatin  20 mg Oral q1800  . dextromethorphan-guaiFENesin  1 tablet Oral BID  . heparin  5,000 Units Subcutaneous Q8H  . insulin aspart  0-20 Units Subcutaneous TID WC  . insulin aspart  0-5 Units Subcutaneous QHS  . insulin detemir  30 Units Subcutaneous Daily  . linagliptin  5 mg Oral QAC breakfast   And  . metFORMIN  1,000 mg Oral Q breakfast  . methylPREDNISolone (SOLU-MEDROL) injection  40 mg Intravenous BID  . mometasone-formoterol  2 puff Inhalation BID  . montelukast  10 mg Oral QHS  . oseltamivir  75 mg Oral BID  . oxybutynin  10 mg Oral QHS  . pantoprazole  40 mg Oral BID AC  . vitamin C  500 mg Oral Daily   Continuous Infusions:   LOS: 1 day    Time spent: 30 min    Janece Canterbury, MD [facility] Pager [phone]  If 7PM-7AM, please contact night-coverage [email or web address] Password [email or web address] 11/07/2016, 2:06 PM

## 2016-11-07 NOTE — Progress Notes (Signed)
SATURATION QUALIFICATIONS: (This note is used to comply with regulatory documentation for home oxygen)  Patient Saturations on Room Air at Rest = 96%  Patient Saturations on Room Air while Ambulating = 75%  Patient Saturations on 0 Liters of oxygen while Ambulating  Please briefly explain why patient needs home oxygen:

## 2016-11-07 NOTE — Progress Notes (Signed)
Patient ambulated in the hallway on room air. O2 sat dropped as low as 75%. Sat patient down in wheelchair and brought back to room. Patient on 2L O2 [place]. PRN respiratory treatment given by RT. Vital signs stable. Will continue to monitor.

## 2016-11-08 LAB — GLUCOSE, CAPILLARY
GLUCOSE-CAPILLARY: 263 mg/dL — AB (ref 65–99)
Glucose-Capillary: 277 mg/dL — ABNORMAL HIGH (ref 65–99)
Glucose-Capillary: 292 mg/dL — ABNORMAL HIGH (ref 65–99)
Glucose-Capillary: 268 mg/dL — ABNORMAL HIGH (ref 65–99)

## 2016-11-08 MED ORDER — ONDANSETRON HCL 4 MG/2ML IJ SOLN: 4.0000 mg | Freq: Four times a day (QID) | INTRAMUSCULAR | Status: DC | PRN

## 2016-11-08 MED ORDER — HEPARIN SODIUM (PORCINE) 5000 UNIT/ML IJ SOLN
5000.0000 [IU] | Freq: Three times a day (TID) | INTRAMUSCULAR | Status: DC
  Administered 2016-11-08 – 2016-11-09 (×2): 5000 [IU] via SUBCUTANEOUS
  Filled 2016-11-08 (×2): qty 1

## 2016-11-08 MED ORDER — PREDNISONE 50 MG PO TABS
50.0000 mg | ORAL_TABLET | Freq: Every day | ORAL | Status: DC
  Administered 2016-11-08 – 2016-11-09 (×2): 50 mg via ORAL
  Filled 2016-11-08 (×2): qty 1

## 2016-11-08 MED ORDER — HYDROCOD POLST-CPM POLST ER 10-8 MG/5ML PO SUER
5.0000 mL | Freq: Two times a day (BID) | ORAL | Status: DC | PRN
  Administered 2016-11-08 – 2016-11-09 (×2): 5 mL via ORAL
  Filled 2016-11-08 (×2): qty 5

## 2016-11-08 NOTE — Progress Notes (Signed)
PROGRESS NOTE  [name]  [id number] DOB: January 10, 1971 DOA: 11/06/2016 PCP: Binnie Rail, MD  Brief Narrative:    [name]  is a 46 y.o. female, with past medical history of interstitial lung disease on chronic 2 L nasal cannula, followed by Dr. Melvyn Novas, hypertension, diabetes mellitus, hyperlipidemia, morbid obesity presented with acute on chronic respiratory failure with hypoxia due to influenza A.  Tachycardia improved with treatment of respiratory distress and IV fluids.  Ongoing wheeze, cough, and dyspnea at rest but improved since yesterday with tamiflu, steroids, and nebulizer treatments, however, had a severe episode of cough with dyspnea this morning that required urgent page to respiratory therapy with nebulizer treatment, cough medication administration this morning.  Happened again this afternoon when she tried to ambulate to RN station while ON oxygen.    Assessment & Plan:   Active Problems:   Severe obesity (BMI >= 40) (HCC)   Essential hypertension   INTERSTITIAL LUNG DISEASE   GASTROESOPHAGEAL REFLUX DISEASE   Chronic respiratory failure (HCC)   Diabetes type 2, uncontrolled (HCC)   Dyslipidemia   Influenza  Acute on chronic respiratory failure with hypoxia secondary to influenza a superimposed on interstitial lung disease  -  Attempted ambulation today: Oxygen saturations better on 2L but had another severe coughing episode with exertion -  D/c Solu-Medrol -  Start prednisone  -  Continue Tamiflu -  Continue nebulizer treatments  Sinus tachycardia -  Tele:  Sinus tachycardia resolving -  Okay to d/c telemetry  Hypertension, BP slightly elevated - resume hydrochlorothiazide  Diabetes mellitus, hyperglycemia - Continue home medication including Janumet - continue to moderate dose sliding scale insulin  - taper steroids  Interstitial lung disease - She is followed by Dr. Melvyn Novas, continue with home medication including Dulera, Singulair, Flonase - Steroid  dependent, taper back to prednisone 10mg  daily eventually  Steroid dependent, as above  Dyslipidemia - Continue with statin  Acute on chronic respiratory failure - At baseline 2 L nasal cannula at night and with exertion - hypoxic with exertion to mid 70s  GERD - Continue with PPI  DVT prophylaxis:  lovenox Code Status:  full Family Communication:  Patient alone Disposition Plan:  Pending able to perform basic ADLs without severe dyspnea and hypoxia.  Will reassess ambulatory pulse ox again tomorrow   Consultants:   none  Procedures:  none  Antimicrobials:  Anti-infectives    Start     Dose/Rate Route Frequency Ordered Stop   11/06/16 2209  oseltamivir (TAMIFLU) capsule 75 mg     75 mg Oral 2 times daily 11/06/16 1509 11/11/16 0959   11/06/16 1430  oseltamivir (TAMIFLU) capsule 75 mg     75 mg Oral Once 11/06/16 1417 11/06/16 1437       Subjective:  Feels better than yesterday, less wheezy, but was panicked this morning when she had a coughing spell after going to the bathroom.  Lasted 20 minutes and had a hard time getting her O2 sat back up on nasal canula.  Happened again during her attempt at ambulation this afternoon.   Objective: Vitals:   11/07/16 1920 11/07/16 2043 11/08/16 0559 11/08/16 0846  BP:  120/72 137/88   Pulse:  62 88   Resp:  18 20   Temp:  97.7 F (36.5 C) 98 F (36.7 C)   TempSrc:  Oral Oral   SpO2: 98% 100% 100% 99%  Weight:      Height:  Intake/Output Summary (Last 24 hours) at 11/08/16 1406 Last data filed at 11/08/16 1316  Gross per 24 hour  Intake              960 ml  Output                0 ml  Net              960 ml   Filed Weights   11/06/16 1635  Weight: 127.5 kg (281 lb 1.6 oz)    Examination:  General exam:  Adult female.  No acute distress HEENT:  NCAT, MMM Respiratory system:  Diminished bilateral breath sounds, wheeze resolved.  Rales are less pronounced today.  Cardiovascular system: Regular rate  and rhythm, normal S1/S2. No murmurs, rubs, gallops or clicks.  Warm extremities Gastrointestinal system: Normal active bowel sounds, soft, nondistended, nontender. MSK:  Normal tone and bulk, no lower extremity edema Neuro:  Grossly intact    Data Reviewed: I have personally reviewed following labs and imaging studies  CBC:  Recent Labs Lab 11/06/16 1014 11/07/16 0531  WBC 8.3 6.7  NEUTROABS 6.2  --   HGB 13.2 12.6  HCT 40.3 38.9  MCV 90.2 89.0  PLT 209 [id number]   Basic Metabolic Panel:  Recent Labs Lab 11/06/16 1014 11/07/16 0531  NA 136 138  K 3.5 4.9  CL 96* 103  CO2 30 24  GLUCOSE 179* 259*  BUN 8 12  CREATININE 0.57 0.60  CALCIUM 8.8* 8.7*   GFR: Estimated Creatinine Clearance: 110.9 mL/min (by C-G formula based on SCr of 0.6 mg/dL). Liver Function Tests:  Recent Labs Lab 11/06/16 1014  AST 20  ALT 21  ALKPHOS 67  BILITOT 0.5  PROT 7.6  ALBUMIN 3.7   No results for input(s): LIPASE, AMYLASE in the last 168 hours. No results for input(s): AMMONIA in the last 168 hours. Coagulation Profile: No results for input(s): INR, PROTIME in the last 168 hours. Cardiac Enzymes: No results for input(s): CKTOTAL, CKMB, CKMBINDEX, TROPONINI in the last 168 hours. BNP (last 3 results) No results for input(s): PROBNP in the last 8760 hours. HbA1C: No results for input(s): HGBA1C in the last 72 hours. CBG:  Recent Labs Lab 11/07/16 1204 11/07/16 1728 11/07/16 2046 11/08/16 0723 11/08/16 1139  GLUCAP 241* 264* 287* 263* 277*   Lipid Profile: No results for input(s): CHOL, HDL, LDLCALC, TRIG, CHOLHDL, LDLDIRECT in the last 72 hours. Thyroid Function Tests: No results for input(s): TSH, T4TOTAL, FREET4, T3FREE, THYROIDAB in the last 72 hours. Anemia Panel: No results for input(s): VITAMINB12, FOLATE, FERRITIN, TIBC, IRON, RETICCTPCT in the last 72 hours. Urine analysis:    Component Value Date/Time   COLORURINE YELLOW 04/26/2014 1048   APPEARANCEUR CLEAR  04/26/2014 1048   LABSPEC 1.015 04/26/2014 1048   PHURINE 6.0 04/26/2014 1048   GLUCOSEU >=1000 (A) 04/26/2014 1048   HGBUR NEGATIVE 04/26/2014 1048   BILIRUBINUR NEGATIVE 04/26/2014 1048   KETONESUR NEGATIVE 04/26/2014 1048   PROTEINUR NEGATIVE 08/02/2007 1259   UROBILINOGEN 0.2 04/26/2014 1048   NITRITE NEGATIVE 04/26/2014 1048   LEUKOCYTESUR NEGATIVE 04/26/2014 1048   Sepsis Labs: @LABRCNTIP (procalcitonin:4,lacticidven:4)  )No results found for this or any previous visit (from the past 240 hour(s)).    Radiology Studies: No results found.   Scheduled Meds: . aspirin EC  81 mg Oral Daily  . atorvastatin  20 mg Oral q1800  . dextromethorphan-guaiFENesin  1 tablet Oral BID  . heparin subcutaneous  5,000 Units Subcutaneous Q8H  .  hydrochlorothiazide  12.5 mg Oral Daily  . insulin aspart  0-20 Units Subcutaneous TID WC  . insulin aspart  0-5 Units Subcutaneous QHS  . insulin detemir  30 Units Subcutaneous Daily  . linagliptin  5 mg Oral QAC breakfast   And  . metFORMIN  1,000 mg Oral Q breakfast  . mometasone-formoterol  2 puff Inhalation BID  . montelukast  10 mg Oral QHS  . oseltamivir  75 mg Oral BID  . oxybutynin  10 mg Oral QHS  . pantoprazole  40 mg Oral BID AC  . predniSONE  50 mg Oral Q breakfast  . vitamin C  500 mg Oral Daily   Continuous Infusions:   LOS: 2 days    Time spent: 30 min    Janece Canterbury, MD [facility] Pager [phone]  If 7PM-7AM, please contact night-coverage [email or web address] Password [email or web address] 11/08/2016, 2:06 PM

## 2016-11-08 NOTE — Progress Notes (Signed)
Inpatient Diabetes Program Recommendations  AACE/ADA: New Consensus Statement on Inpatient Glycemic Control (2015)  Target Ranges:  Prepandial:   less than 140 mg/dL      Peak postprandial:   less than 180 mg/dL (1-2 hours)      Critically ill patients:  140 - 180 mg/dL   Results for [name], [name] (MRN [id number]) as of 11/08/2016 13:09  Ref. Range 11/07/2016 07:22 11/07/2016 12:04 11/07/2016 17:28 11/07/2016 20:46  Glucose-Capillary Latest Ref Range: 65 - 99 mg/dL 261 (H) 241 (H) 264 (H) 287 (H)   Results for [name], [name] (MRN [id number]) as of 11/08/2016 13:09  Ref. Range 11/08/2016 07:23 11/08/2016 11:39  Glucose-Capillary Latest Ref Range: 65 - 99 mg/dL 263 (H) 277 (H)    Home DM Meds: Levemir 30 units QHS + Janumet 100/1000 mg daily  Current Orders: Levemir 30 units daily       Novolog Resistant Correction Scale/ SSI (0-20 units) TID AC + HS      Metformin 1000 mg daily       Tradjenta 5 mg daily     MD- Note patient received last dose of Solumedrol yesterday evening at 9pm.  Now getting Prednisone 50 mg daily  If patient continues to have glucose elevations while getting PO steroids, please consider the following:  1. Increase Levemir to 35 units daily  2. Start Novolog Meal Coverage: Novolog 4 units TID with meals (hold if pt eats <50% of meal)     --Will follow patient during hospitalization--  Wyn Quaker RN, MSN, CDE Diabetes Coordinator Inpatient Glycemic Control Team Team Pager: [phone] (8a-5p)

## 2016-11-08 NOTE — Progress Notes (Signed)
Patient had severe coughing spell this AM. Spell lasted for approximately 20 minutes. Her face became reddened, the patient started experiencing shortness of breath, and had chest pain r/t the severity of the cough. Episode resolved following medications, see MAR.

## 2016-11-08 NOTE — Progress Notes (Signed)
SATURATION QUALIFICATIONS: (This note is used to comply with regulatory documentation for home oxygen)  Patient Saturations on Room Air at Rest = 95%  Patient Saturations on Room Air while Ambulating = 85%  Patient Saturations on 2 Liters of oxygen while Ambulating = 100%  Please briefly explain why patient needs home oxygen:  While ambulating, patient started to have a coughing spell, became weak. Had the patient to sit down and brought back to room. MD aware. See MAR for medication given. Will continue to monitor.

## 2016-11-09 DIAGNOSIS — J9621 Acute and chronic respiratory failure with hypoxia: Secondary | ICD-10-CM

## 2016-11-09 LAB — GLUCOSE, CAPILLARY: Glucose-Capillary: 146 mg/dL — ABNORMAL HIGH (ref 65–99)

## 2016-11-09 MED ORDER — HYDROCOD POLST-CPM POLST ER 10-8 MG/5ML PO SUER: 5.0000 mL | Freq: Two times a day (BID) | ORAL | 0 refills | Status: DC | PRN

## 2016-11-09 MED ORDER — PREDNISONE 10 MG PO TABS: ORAL_TABLET | ORAL | 0 refills | Status: DC

## 2016-11-09 MED ORDER — OSELTAMIVIR PHOSPHATE 75 MG PO CAPS: 75.0000 mg | ORAL_CAPSULE | Freq: Two times a day (BID) | ORAL | 0 refills | Status: DC

## 2016-11-09 NOTE — Progress Notes (Signed)
Patient was able to ambulate with 2L O2 without feeling weak. O2 saturation at rest on oxygen- 100% O2 saturations on 2L O2- 99%.

## 2016-11-09 NOTE — Progress Notes (Signed)
Date: November 09, 2016 Discharge orders checked for needs. No case management needs present at time of discharge. Velva Harman, RN, BSN, [place]   [phone]

## 2016-11-09 NOTE — Discharge Summary (Signed)
Physician Discharge Summary  TYLAH MANCILLAS OFB:[id number] DOB: 02-24-1971 DOA: 11/06/2016  PCP: Binnie Rail, MD  Admit date: 11/06/2016 Discharge date: 11/09/2016  Admitted From: home  Disposition:  home  Recommendations for Outpatient Follow-up:  1. Follow up with Pulmonology and PCP in 1-2 weeks as needed  Home Health:  none Equipment/Devices:  Continuous oxygen 2L [place] (already had home oxygen before admission but only used as needed and at night   Discharge Condition:  Stable, improved CODE STATUS:  Full code Diet recommendation:  diabetic   Brief/Interim Summary:  SadieBlueis a 46 y.o.female,with past medical history of interstitial lung disease on chronic 2 L nasal cannula, followed by Dr. Melvyn Novas, hypertension, diabetes mellitus, hyperlipidemia, morbid obesity presented with acute on chronic respiratory failure with hypoxia due to influenza A.  Tachycardia improved with treatment of respiratory distress and IV fluids.  Her wheeze, cough, and dyspnea gradually improved with tamiflu, steroids, and nebulizer treatments, however, for several days she would have severe episodes of cough that would last 20 minutes with moderate respiratory distress and difficulty maintaining oxygen levels greater than 88% on oxygen.  With continuous use of oxygen and additional steroids and mucolytics/cough suppressants, her coughing spells improved.  She has arranged for help with meals and ADLs at home for the next few days while she recovers.      Discharge Diagnoses:  Principal Problem:   Influenza Active Problems:   Severe obesity (BMI >= 40) (HCC)   Essential hypertension   INTERSTITIAL LUNG DISEASE   GASTROESOPHAGEAL REFLUX DISEASE   Acute on chronic respiratory failure with hypoxia (HCC)   Diabetes type 2, uncontrolled (HCC)   Dyslipidemia  Acute on chronic respiratory failure with hypoxia secondary to influenza a superimposed on interstitial lung disease  -  Ambulation on date of discharge:  Oxygen saturations in mid-90s on 2L and no severe coughing episode despite walking length of hall -  taper prednisone quickly back to 78m daily -  Continue Tamiflu  Sinus tachycardia, resolved  Hypertension, due to severity of illness, held HCTZ at admission, but resumed when BP rose  Diabetes mellitus, hyperglycemia - Continue home medication including Janumet - given moderate dose sliding scale insulin  - taper prednisone quickly back to 142mdaily  Interstitial lung disease - She is followed by Dr. WeMelvyn Novascontinue with home medication including Dulera, Singulair, Flonase - Steroid dependent, taper back to prednisone 1074maily  Steroid dependent, as above  Dyslipidemia - Continue with statin  Acute on chronic respiratory failure - At baseline 2 L nasal cannula at night and with exertion - hypoxic with exertion to mid 70s  GERD - Continue with PPI  Discharge Instructions  Discharge Instructions    Call MD for:  difficulty breathing, headache or visual disturbances    Complete by:  As directed    Call MD for:  extreme fatigue    Complete by:  As directed    Call MD for:  hives    Complete by:  As directed    Call MD for:  persistant dizziness or light-headedness    Complete by:  As directed    Call MD for:  persistant nausea and vomiting    Complete by:  As directed    Call MD for:  severe uncontrolled pain    Complete by:  As directed    Call MD for:  temperature >100.4    Complete by:  As directed    Diet Carb Modified    Complete by:  As directed    Increase activity slowly    Complete by:  As directed        Medication List    STOP taking these medications   Clobetasol Prop Emollient Base 0.05 % emollient cream Commonly known as:  CLOBETASOL PROPIONATE E   doxycycline 100 MG tablet Commonly known as:  VIBRA-TABS   hydrochlorothiazide 12.5 MG capsule Commonly known as:  MICROZIDE     TAKE these medications   albuterol 108 (90 Base)  MCG/ACT inhaler Commonly known as:  PROVENTIL HFA;VENTOLIN HFA Inhale 2 puffs into the lungs every 6 (six) hours as needed for wheezing or shortness of breath.   aspirin EC 81 MG tablet Take 1 tablet (81 mg total) by mouth daily.   atorvastatin 20 MG tablet Commonly known as:  LIPITOR TAKE 1 TABLET (20 MG TOTAL) BY MOUTH DAILY AT 6 PM. What changed:  See the new instructions.   benzonatate 100 MG capsule Commonly known as:  TESSALON Take 100 mg by mouth 3 (three) times daily as needed for cough.   chlorpheniramine-HYDROcodone 10-8 MG/5ML Suer Commonly known as:  TUSSIONEX Take 5 mLs by mouth every 12 (twelve) hours as needed for cough.   dextromethorphan-guaiFENesin 30-600 MG 12hr tablet Commonly known as:  MUCINEX DM Take 1 tablet by mouth 2 (two) times daily as needed for cough.   fluticasone 50 MCG/ACT nasal spray Commonly known as:  FLONASE Place 2 sprays into both nostrils daily as needed for allergies or rhinitis.   glucose blood test strip Commonly known as:  ONE TOUCH ULTRA TEST Use to check blood sugars twice a day.   Insulin Detemir 100 UNIT/ML Pen Commonly known as:  LEVEMIR FLEXPEN Inject 30 Units into the skin daily. What changed:  when to take this   Insulin Pen Needle 31G X 8 MM Misc Commonly known as:  B-D ULTRAFINE III Aunika Kirsten PEN USE NEEDLE AS NEEDED TO INJECTED INSULIN AS PRESCRIBED BY PHYSICIAN   IRON PO Take 65 mg by mouth daily.   loratadine 10 MG tablet Commonly known as:  CLARITIN Take 10 mg by mouth daily as needed (nasal drainage, drip).   mometasone-formoterol 100-5 MCG/ACT Aero Commonly known as:  DULERA Take 2 puffs first thing in am and then another 2 puffs about 12 hours later.   montelukast 10 MG tablet Commonly known as:  SINGULAIR Take 1 tablet (10 mg total) by mouth at bedtime.   ONE TOUCH LANCETS Misc Use as directed to help check blood sugars twice daily. Dx 250.00   ONE TOUCH ULTRA MINI w/Device Kit Use as directed to  check blood sugar twice a day. Dx 250.00   oseltamivir 75 MG capsule Commonly known as:  TAMIFLU Take 1 capsule (75 mg total) by mouth 2 (two) times daily.   oxybutynin 10 MG 24 hr tablet Commonly known as:  DITROPAN XL Take 1 tablet (10 mg total) by mouth at bedtime. What changed:  when to take this  reasons to take this   OXYGEN Inhale 2-3 L into the lungs daily.   pantoprazole 40 MG tablet Commonly known as:  PROTONIX Take 1 tablet (40 mg total) by mouth 2 (two) times daily before a meal.   predniSONE 10 MG tablet Commonly known as:  DELTASONE Please take 4 tabs tomorrow, 3 tabs on Sunday, 2 tabs on Monday, then resume 62m daily What changed:  See the new instructions.   SitaGLIPtin-MetFORMIN HCl [phone] MG Tb24 Commonly known as:  JANUMET XR Take 1  tablet by mouth daily.   TURMERIC PO Take 1 capsule by mouth daily.   VITAMIN C PO Take 1 tablet by mouth daily.      Follow-up Information    Binnie Rail, MD Follow up.   Specialty:  Internal Medicine Why:  as needed  Contact information: [place] [address] [phone]        Christinia Gully, MD. Schedule an appointment as soon as possible for a visit.   Specialty:  Pulmonary Disease Why:  as needed Contact information: [address] [place] [address] [phone]          Allergies  Allergen Reactions  . Penicillins Shortness Of Breath, Swelling and Other (See Comments)    Has patient had a PCN reaction causing immediate rash, facial/tongue/throat swelling, SOB or lightheadedness with hypotension: yes Has patient had a PCN reaction causing severe rash involving mucus membranes or skin necrosis: no Has patient had a PCN reaction that required hospitalization no Has patient had a PCN reaction occurring within the last 10 years: no If all of the above answers are "NO", then may proceed with Cephalosporin use.   Marland Kitchen Peach Flavor Hives and Other (See Comments)    Fresh  peaches only    Consultations: none   Procedures/Studies: Dg Chest 2 View  Result Date: 11/06/2016 CLINICAL DATA:  Fever and chills.  Cough. EXAM: CHEST  2 VIEW COMPARISON:  Chest radiograph October 06, 2013 and chest CT October 09, 2016 FINDINGS: There is atelectatic change in the right lung base. There is no frank edema or consolidation. Heart is borderline enlarged with pulmonary vascularity within normal limits. No adenopathy. No bone lesions. There is degenerative change in the lower thoracic region. IMPRESSION: Atelectatic change right base. No edema or consolidation. Stable cardiac silhouette. Electronically Signed   By: Lowella Grip III M.D.   On: 11/06/2016 11:33       Subjective: Feeling much better today.  No further coughing spells like what she had yesterday morning.  Her cough is now mild and productive and does not cause her to become severely winded for 20 minutes as it had before.    Discharge Exam: Vitals:   11/08/16 2051 11/09/16 0518  BP:  (!) 142/88  Pulse: 62 (!) 116  Resp: 18 19  Temp:  98.2 F (36.8 C)   Vitals:   11/08/16 2051 11/09/16 0518 11/09/16 0836 11/09/16 0838  BP:  (!) 142/88    Pulse: 62 (!) 116    Resp: 18 19    Temp:  98.2 F (36.8 C)    TempSrc:  Oral    SpO2:  100% 99% 99%  Weight:      Height:        General exam:  Adult female.  No acute distress HEENT:  NCAT, MMM Respiratory system:  Diminished bilateral breath sounds, wheeze resolved.  Rales persistent at bases.  Cardiovascular system: Regular rate and rhythm, normal S1/S2. No murmurs, rubs, gallops or clicks.  Warm extremities Gastrointestinal system: Normal active bowel sounds, soft, nondistended, nontender. MSK:  Normal tone and bulk, no lower extremity edema Neuro:  Grossly intact    The results of significant diagnostics from this hospitalization (including imaging, microbiology, ancillary and laboratory) are listed below for reference.     Microbiology: No  results found for this or any previous visit (from the past 240 hour(s)).   Labs: BNP (last 3 results)  Recent Labs  11/06/16 1014  BNP 31.8  Basic Metabolic Panel:  Recent Labs Lab 11/06/16 1014 11/07/16 0531  NA 136 138  K 3.5 4.9  CL 96* 103  CO2 30 24  GLUCOSE 179* 259*  BUN 8 12  CREATININE 0.57 0.60  CALCIUM 8.8* 8.7*   Liver Function Tests:  Recent Labs Lab 11/06/16 1014  AST 20  ALT 21  ALKPHOS 67  BILITOT 0.5  PROT 7.6  ALBUMIN 3.7   No results for input(s): LIPASE, AMYLASE in the last 168 hours. No results for input(s): AMMONIA in the last 168 hours. CBC:  Recent Labs Lab 11/06/16 1014 11/07/16 0531  WBC 8.3 6.7  NEUTROABS 6.2  --   HGB 13.2 12.6  HCT 40.3 38.9  MCV 90.2 89.0  PLT 209 201   Cardiac Enzymes: No results for input(s): CKTOTAL, CKMB, CKMBINDEX, TROPONINI in the last 168 hours. BNP: Invalid input(s): POCBNP CBG:  Recent Labs Lab 11/08/16 0723 11/08/16 1139 11/08/16 1710 11/08/16 2050 11/09/16 0716  GLUCAP 263* 277* 292* 268* 146*   D-Dimer No results for input(s): DDIMER in the last 72 hours. Hgb A1c No results for input(s): HGBA1C in the last 72 hours. Lipid Profile No results for input(s): CHOL, HDL, LDLCALC, TRIG, CHOLHDL, LDLDIRECT in the last 72 hours. Thyroid function studies No results for input(s): TSH, T4TOTAL, T3FREE, THYROIDAB in the last 72 hours.  Invalid input(s): FREET3 Anemia work up No results for input(s): VITAMINB12, FOLATE, FERRITIN, TIBC, IRON, RETICCTPCT in the last 72 hours. Urinalysis    Component Value Date/Time   COLORURINE YELLOW 04/26/2014 1048   APPEARANCEUR CLEAR 04/26/2014 1048   LABSPEC 1.015 04/26/2014 1048   PHURINE 6.0 04/26/2014 1048   GLUCOSEU >=1000 (A) 04/26/2014 1048   HGBUR NEGATIVE 04/26/2014 1048   BILIRUBINUR NEGATIVE 04/26/2014 1048   KETONESUR NEGATIVE 04/26/2014 1048   PROTEINUR NEGATIVE 08/02/2007 1259   UROBILINOGEN 0.2 04/26/2014 1048   NITRITE NEGATIVE  04/26/2014 1048   LEUKOCYTESUR NEGATIVE 04/26/2014 1048   Sepsis Labs Invalid input(s): PROCALCITONIN,  WBC,  LACTICIDVEN   Time coordinating discharge: Over 30 minutes  SIGNED:   Janece Canterbury, MD  [facility] 11/09/2016, 11:18 AM Pager   If 7PM-7AM, please contact night-coverage [email or web address] Password [email or web address]

## 2016-12-12 ENCOUNTER — Other Ambulatory Visit: Payer: Self-pay | Admitting: *Deleted

## 2016-12-12 MED ORDER — SITAGLIP PHOS-METFORMIN HCL ER 100-1000 MG PO TB24: 1.0000 | ORAL_TABLET | Freq: Every day | ORAL | 1 refills | Status: DC

## 2016-12-19 ENCOUNTER — Telehealth (HOSPITAL_COMMUNITY): Payer: Self-pay | Admitting: *Deleted

## 2016-12-19 DIAGNOSIS — I272 Pulmonary hypertension, unspecified: Secondary | ICD-10-CM

## 2016-12-19 NOTE — Telephone Encounter (Signed)
Received referral from Marella Chimes, [facility], pt w/ h/o ILD and PAH suggested on chest CT due to dilated pulmonary artery.  Per Dr Haroldine Laws pt needs echo and new pt eval.  Order placed will schedule.

## 2017-01-02 ENCOUNTER — Telehealth: Payer: Self-pay | Admitting: Internal Medicine

## 2017-01-02 NOTE — Telephone Encounter (Signed)
Spoke with the pt  She states due to insurance issues she needs to see [place] in August rather than April  She has already scheduled appt with [place] with PFT for 05/06/17  Her breathing is doing well and she is not having any problems  I advised this is fine, but to call us as needed in the meantime  Will forward to MW as an Pharmacist, hospital

## 2017-01-07 ENCOUNTER — Ambulatory Visit: Payer: BC Managed Care – PPO | Admitting: Internal Medicine

## 2017-01-23 ENCOUNTER — Other Ambulatory Visit: Payer: Self-pay | Admitting: Emergency Medicine

## 2017-01-23 MED ORDER — ATORVASTATIN CALCIUM 20 MG PO TABS: ORAL_TABLET | ORAL | 0 refills | Status: DC

## 2017-01-24 ENCOUNTER — Encounter (HOSPITAL_COMMUNITY): Payer: Self-pay

## 2017-01-24 ENCOUNTER — Ambulatory Visit (HOSPITAL_BASED_OUTPATIENT_CLINIC_OR_DEPARTMENT_OTHER)
Admission: RE | Admit: 2017-01-24 | Discharge: 2017-01-24 | Disposition: A | Discharge status: Home / Self Care | Payer: BC Managed Care – PPO | Source: Ambulatory Visit | Attending: Internal Medicine | Admitting: Internal Medicine

## 2017-01-24 ENCOUNTER — Other Ambulatory Visit (HOSPITAL_COMMUNITY): Payer: Self-pay | Admitting: *Deleted

## 2017-01-24 ENCOUNTER — Encounter (HOSPITAL_COMMUNITY): Payer: Self-pay | Admitting: *Deleted

## 2017-01-24 ENCOUNTER — Ambulatory Visit (HOSPITAL_COMMUNITY)
Admission: RE | Admit: 2017-01-24 | Discharge: 2017-01-24 | Disposition: A | Discharge status: Home / Self Care | Payer: BC Managed Care – PPO | Source: Ambulatory Visit | Attending: Internal Medicine | Admitting: Internal Medicine

## 2017-01-24 VITALS — BP 149/104 | HR 73 | Wt 276.2 lb

## 2017-01-24 DIAGNOSIS — I10 Essential (primary) hypertension: Secondary | ICD-10-CM | POA: Diagnosis not present

## 2017-01-24 DIAGNOSIS — I272 Pulmonary hypertension, unspecified: Secondary | ICD-10-CM

## 2017-01-24 DIAGNOSIS — Z79899 Other long term (current) drug therapy: Secondary | ICD-10-CM | POA: Diagnosis not present

## 2017-01-24 DIAGNOSIS — J8489 Other specified interstitial pulmonary diseases: Secondary | ICD-10-CM | POA: Insufficient documentation

## 2017-01-24 DIAGNOSIS — Z6841 Body Mass Index (BMI) 40.0 and over, adult: Secondary | ICD-10-CM | POA: Diagnosis not present

## 2017-01-24 DIAGNOSIS — J849 Interstitial pulmonary disease, unspecified: Secondary | ICD-10-CM

## 2017-01-24 DIAGNOSIS — Z809 Family history of malignant neoplasm, unspecified: Secondary | ICD-10-CM | POA: Diagnosis not present

## 2017-01-24 DIAGNOSIS — K219 Gastro-esophageal reflux disease without esophagitis: Secondary | ICD-10-CM | POA: Insufficient documentation

## 2017-01-24 DIAGNOSIS — Z8249 Family history of ischemic heart disease and other diseases of the circulatory system: Secondary | ICD-10-CM | POA: Insufficient documentation

## 2017-01-24 DIAGNOSIS — Z833 Family history of diabetes mellitus: Secondary | ICD-10-CM | POA: Insufficient documentation

## 2017-01-24 DIAGNOSIS — E119 Type 2 diabetes mellitus without complications: Secondary | ICD-10-CM | POA: Diagnosis not present

## 2017-01-24 DIAGNOSIS — Z7982 Long term (current) use of aspirin: Secondary | ICD-10-CM | POA: Insufficient documentation

## 2017-01-24 DIAGNOSIS — Z794 Long term (current) use of insulin: Secondary | ICD-10-CM | POA: Insufficient documentation

## 2017-01-24 NOTE — Progress Notes (Signed)
PCP: Dr Quay Burow Cardiology: Dr Aundra Dubin  46 yo referred by PA Pearline Cables (rheumatology) for pulmonary hypertension evaluation.   Patient has history of morbid obesity, type II diabetes, and oxygen-dependent interstitial lung disease.  She was diagnosed with ILD by open lung biopsy in 2005.  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  She has been on prednisone long-term.  She uses oxygen with exertion and at night.  She is followed by rheumatology, no definite diagnosis, but ANA and anti-Smith are positive.  She has chronic polyarthralgias.   She generally does not get short of breath if she wears her oxygen with activity.  She can walk > 1 mile on a treadmill in 30 minutes.  Mild dyspnea with stairs.  No lightheadedness/syncope. No chest pain.  Able to shower, dress, do housework.  BP is high today, normally runs in 130s/80s when she checks at home.   She was noted on recent high resolution CT chest to have a dilated PA concerning for pulmonary hypertension.  Echo done today was reviewed.  Unable to estimate PA pressure from the study.  The RV is normal in size with mildly decreased systolic function.  Normal LV EF.  She was referred for pulmonary hypertension evaluation.   Labs (2/18): K 4.9, creatinine 0.6  ECG (2/18, personally reviewed): NSR, low voltage  PMH: 1. GERD 2. HTN 3. Morbid obesity 4. Type II diabetes 5. 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  She has been treated with steroids. Uses oxygen at night and with activity.  - High resolution CT chest 1/18: ILD without frank honeycombing, dilated PA.  6. Hyperlipidemia 7. Echo (4/18): EF 60-65%, normal RV size with mildly decreased systolic function, PA pressure not able to be adequately estimated.  8. Rheumatological workup: ANA positive 1:640, anti-Smith positive, anti-dsDNA negative, SCL-70 negative, RF negative.   Social History   Social History  . Marital status: Married    Spouse name: N/A  .  Number of children: 1  . Years of education: N/A   Occupational History  . 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 Smoking status: Never Smoker  . Smokeless tobacco: Never Used     Comment: {  . Alcohol use No  . Drug use: No  . Sexual activity: Not Currently   Other Topics Concern  . 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 Sarcoidosis Mother     dxed by transbrochial bx  . Allergies Mother   . Heart disease Father   . Diabetes Father   . Allergies Father   . Coronary artery disease Father   . Cancer Maternal Grandmother     CA of unknown type  . Cancer Paternal Grandmother     CA of unknown type   ROS: All systems reviewed and negative except as per HPI  Current Outpatient Prescriptions  Medication Sig Dispense Refill  . albuterol (PROVENTIL HFA;VENTOLIN HFA) 108 (90 Base) MCG/ACT inhaler Inhale 2 puffs into the lungs every 6 (six) hours as needed for wheezing or shortness of breath.    . Ascorbic Acid (VITAMIN C PO) Take 1 tablet by mouth daily.     Marland Kitchen aspirin EC 81 MG tablet Take 1 tablet (81  mg total) by mouth daily. 150 tablet 2  . atorvastatin (LIPITOR) 20 MG tablet TAKE 1 TABLET (20 MG TOTAL) BY MOUTH DAILY -- Office visit needed for further refills 90 tablet 0  . Blood Glucose Monitoring Suppl (ONE TOUCH ULTRA MINI) W/DEVICE KIT Use as directed to check blood sugar twice a day. Dx 250.00 1 each 0  . fluticasone (FLONASE) 50 MCG/ACT nasal spray Place 2 sprays into both nostrils daily as needed for allergies or rhinitis.    Marland Kitchen glucose blood (ONE TOUCH ULTRA TEST) test strip Use to check blood sugars twice a day. 100 each 4  . Insulin Detemir (LEVEMIR FLEXPEN) 100 UNIT/ML Pen Inject 30 Units into the skin daily. (Patient taking differently: Inject 30 Units into the  skin at bedtime. ) 15 mL 5  . Insulin Pen Needle (B-D ULTRAFINE III SHORT PEN) 31G X 8 MM MISC USE NEEDLE AS NEEDED TO INJECTED INSULIN AS PRESCRIBED BY PHYSICIAN 100 each 2  . IRON PO Take 65 mg by mouth daily.    Marland Kitchen loratadine (CLARITIN) 10 MG tablet Take 10 mg by mouth daily as needed (nasal drainage, drip).    . mometasone-formoterol (DULERA) 100-5 MCG/ACT AERO Take 2 puffs first thing in am and then another 2 puffs about 12 hours later. 1 Inhaler 11  . montelukast (SINGULAIR) 10 MG tablet Take 1 tablet (10 mg total) by mouth at bedtime. 30 tablet 5  . ONE TOUCH LANCETS MISC Use as directed to help check blood sugars twice daily. Dx 250.00 60 each 5  . oxybutynin (DITROPAN XL) 10 MG 24 hr tablet Take 1 tablet (10 mg total) by mouth at bedtime. (Patient taking differently: Take 10 mg by mouth at bedtime as needed (For overactive bladder.). ) 90 tablet 1  . OXYGEN-HELIUM IN Inhale 2-3 L into the lungs daily.     . pantoprazole (PROTONIX) 40 MG tablet Take 1 tablet (40 mg total) by mouth 2 (two) times daily before a meal. 60 tablet 11  . predniSONE (DELTASONE) 1 MG tablet Take 8 mg by mouth daily with breakfast.    . SitaGLIPtin-MetFORMIN HCl (JANUMET XR) [phone] MG TB24 Take 1 tablet by mouth daily. 90 tablet 1  . TURMERIC PO Take 1 capsule by mouth daily.      No current facility-administered medications for this encounter.    BP (!) 149/104   Pulse 73   Wt 276 lb 4 oz (125.3 kg)   SpO2 98%   BMI 53.95 kg/m  General: NAD, obese Neck: JVP 8 cm, no thyromegaly or thyroid nodule.  Lungs: Clear to auscultation bilaterally with normal respiratory effort. CV: Nondisplaced PMI.  Heart regular S1/S2, no S3/S4, no murmur.  No peripheral edema.  No carotid bruit.  Normal pedal pulses.  Abdomen: Soft, nontender, no hepatosplenomegaly, no distention.  Skin: Intact without lesions or rashes.  Neurologic: Alert and oriented x 3.  Psych: Normal affect. Extremities: No clubbing or cyanosis.  HEENT:  Normal.   Assessment/Plan: 1. 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 She has been on prednisone and oxygen at night and with exertion.  Possible that this could be related to a rheumatological disease, though no definite diagnosis has been given.  She has polyarthralgias, positive ANA, and positive anti-Smith.  2. Pulmonary hypertension: Possible diagnosis.  She has a dilated PA which is concerning.  Unable to estimate PA pressure accurately from echo, but the RV is mildly hypokinetic.  Treatment of PAH related to ILD  alone with pulmonary vasodilators is somewhat controversial.  However, I am concerned that she may have a rheumatological diagnosis and thus group 1 PH.  I think we need a definitive diagnosis +/- PH, so think that she will need RHC.  We discussed risks/benefits today and she agrees to proceed with RHC.  I will arrange for next week.  3. HTN: BP elevated today but runs in normal range when she checks at home. Will need to follow over time.   Loralie Champagne 01/24/2017

## 2017-01-24 NOTE — Patient Instructions (Signed)
Right Heart Catheterization on Wed 01/30/17, see instruction sheet    Pulmonary Artery Catheterization Pulmonary artery catheterization is a procedure that is used to test blood movement through the heart and to monitor the heart's function. In this procedure, a thin, flexible tube (catheter) is passed into the right side of the heart and into the main artery tha carries blood from your heart to your lungs (pulmonary artery). The procedure may be used to evaluate or help diagnose various problems, such a:  Heart failure.  Shock.  Leaky heart valves (valvular regurgitation).  Congenital heart disease.  Burns.  Kidney disease.  High blood pressure within the arteries in the lungs (pulmonary hypertension).  A buildup of fluid around the heart that prevents the heart from functioning normally (cardiac tamponade).  A disease that causes the heart muscle to become rigid (restrictive cardiomyopathy).  Abnormal blood flow between two areas of the heart (shunt). After a heart attack, this procedure may be used to monitor for further problems and to see if medicines are working. The procedure may be done in a cardiac catheterization lab or in an intensive care unit (ICU). Tell a health care provider about:  Any allergies you have.  All medicines you are taking, including vitamins, herbs, eye drops, creams, and over-the-counter medicines.  Any problems you or family members have had with anesthetic medicines.  Any blood disorders you have.  Any surgeries you have had.  Any medical conditions you have. What are the risks? Generally, this is a safe procedure. However, problems may occur, including:  Bruising or bleeding at the catheter insertion site.  Injury to the vein where the catheter was inserted.  Puncture to the lung. This is a risk if neck or chest veins are used. The following problems may also occur, but they are very rare:  Abnormal heart rhythms.  Low blood  pressure.  Infection.  Cardiac tamponade.  Blocked blood vessel caused by a blood clot or foreign material circulating in the blood (embolism). This can be caused by blood clots at the tip of the catheter. What happens before the procedure?  Follow instructions from your health care provider about eating or drinking restrictions.  Ask your health care provider about:  Changing or stopping your regular medicines. This is especially important if you are taking diabetes medicines or blood thinners.  Taking medicines such as aspirin and ibuprofen. These medicines can thin your blood. Do not take these medicines before your procedure if your health care provider instructs you not to. What happens during the procedure?  An IV tube will be inserted into one of your veins.  You may be given a medicine that helps you relax (sedative).  The area of your body that is chosen for insertion of the catheter will be cleaned. This is usually the neck or groin, but the insertion is sometimes done in another area.  You will be given a medicine that numbs this area (local anesthetic).  A small incision will be made in a vein in this area.  A catheter will be inserted through the incision and into the vein. The health care provider will carefully move the catheter into the upper chamber of the heart (right atrium). X-rays may be used to help guide the catheter to the right place.  The catheter will be threaded through two heart valves (tricuspid valve and pulmonary valve) and placed into the pulmonary artery.  As soon as the catheter is in place, the blood pressure in the pulmonary  artery will be measured.  During the procedure, your heart's rhythm will be watched constantly using an electrocardiogram (ECG).  The catheter will be removed after tests and monitoring have been completed. The procedure may vary among health care providers and hospitals. What happens after the procedure?  Your blood  pressure, heart rate, breathing rate, and blood oxygen level will be monitored often until the medicines you were given have worn off. This information is not intended to replace advice given to you by your health care provider. Make sure you discuss any questions you have with your health care provider. Document Released: 01/21/2007 Document Revised: 02/23/2016 Document Reviewed: 09/21/2014 Elsevier Interactive Patient Education  2017 [employer].

## 2017-01-30 ENCOUNTER — Ambulatory Visit (HOSPITAL_COMMUNITY)
Admission: RE | Admit: 2017-01-30 | Discharge: 2017-01-30 | Disposition: A | Discharge status: Home / Self Care | Payer: BC Managed Care – PPO | Source: Ambulatory Visit | Attending: Cardiology | Admitting: Cardiology

## 2017-01-30 ENCOUNTER — Encounter (HOSPITAL_COMMUNITY)
Admission: RE | Disposition: A | Discharge status: Home / Self Care | Payer: Self-pay | Source: Ambulatory Visit | Attending: Cardiology

## 2017-01-30 ENCOUNTER — Encounter (HOSPITAL_COMMUNITY): Payer: Self-pay | Admitting: Cardiology

## 2017-01-30 DIAGNOSIS — Z6841 Body Mass Index (BMI) 40.0 and over, adult: Secondary | ICD-10-CM | POA: Insufficient documentation

## 2017-01-30 DIAGNOSIS — Z7982 Long term (current) use of aspirin: Secondary | ICD-10-CM | POA: Insufficient documentation

## 2017-01-30 DIAGNOSIS — Z8249 Family history of ischemic heart disease and other diseases of the circulatory system: Secondary | ICD-10-CM | POA: Diagnosis not present

## 2017-01-30 DIAGNOSIS — Z7951 Long term (current) use of inhaled steroids: Secondary | ICD-10-CM | POA: Diagnosis not present

## 2017-01-30 DIAGNOSIS — K219 Gastro-esophageal reflux disease without esophagitis: Secondary | ICD-10-CM | POA: Insufficient documentation

## 2017-01-30 DIAGNOSIS — I272 Pulmonary hypertension, unspecified: Secondary | ICD-10-CM

## 2017-01-30 DIAGNOSIS — Z7952 Long term (current) use of systemic steroids: Secondary | ICD-10-CM | POA: Insufficient documentation

## 2017-01-30 DIAGNOSIS — J849 Interstitial pulmonary disease, unspecified: Secondary | ICD-10-CM | POA: Diagnosis not present

## 2017-01-30 DIAGNOSIS — E785 Hyperlipidemia, unspecified: Secondary | ICD-10-CM | POA: Insufficient documentation

## 2017-01-30 DIAGNOSIS — E118 Type 2 diabetes mellitus with unspecified complications: Secondary | ICD-10-CM | POA: Insufficient documentation

## 2017-01-30 DIAGNOSIS — Z9981 Dependence on supplemental oxygen: Secondary | ICD-10-CM | POA: Diagnosis not present

## 2017-01-30 DIAGNOSIS — I10 Essential (primary) hypertension: Secondary | ICD-10-CM | POA: Diagnosis not present

## 2017-01-30 DIAGNOSIS — Z794 Long term (current) use of insulin: Secondary | ICD-10-CM | POA: Diagnosis not present

## 2017-01-30 HISTORY — PX: RIGHT HEART CATH: CATH118263

## 2017-01-30 LAB — POCT I-STAT 3, VENOUS BLOOD GAS (G3P V)
ACID-BASE EXCESS: 2 mmol/L (ref 0.0–2.0)
ACID-BASE EXCESS: 3 mmol/L — AB (ref 0.0–2.0)
Bicarbonate: 28 mmol/L (ref 20.0–28.0)
Bicarbonate: 29.1 mmol/L — ABNORMAL HIGH (ref 20.0–28.0)
O2 SAT: 64 %
O2 Saturation: 63 %
PH VEN: 7.37 (ref 7.250–7.430)
PO2 VEN: 34 mmHg (ref 32.0–45.0)
TCO2: 29 mmol/L (ref 0–100)
TCO2: 31 mmol/L (ref 0–100)
pCO2, Ven: 47.7 mmHg (ref 44.0–60.0)
pCO2, Ven: 48.4 mmHg (ref 44.0–60.0)
pH, Ven: 7.394 (ref 7.250–7.430)
pO2, Ven: 34 mmHg (ref 32.0–45.0)

## 2017-01-30 LAB — CBC
HEMATOCRIT: 42.3 % (ref 36.0–46.0)
HEMOGLOBIN: 13.6 g/dL (ref 12.0–15.0)
MCH: 29.2 pg (ref 26.0–34.0)
MCHC: 32.2 g/dL (ref 30.0–36.0)
MCV: 91 fL (ref 78.0–100.0)
PLATELETS: 229 10*3/uL (ref 150–400)
RBC: 4.65 MIL/uL (ref 3.87–5.11)
RDW: 14.6 % (ref 11.5–15.5)
WBC: 8.5 10*3/uL (ref 4.0–10.5)

## 2017-01-30 LAB — BASIC METABOLIC PANEL
ANION GAP: 8 (ref 5–15)
BUN: 10 mg/dL (ref 6–20)
CO2: 29 mmol/L (ref 22–32)
CREATININE: 0.58 mg/dL (ref 0.44–1.00)
Calcium: 9 mg/dL (ref 8.9–10.3)
Chloride: 99 mmol/L — ABNORMAL LOW (ref 101–111)
GFR calc non Af Amer: >60 mL/min (ref >60)
Glucose, Bld: 134 mg/dL — ABNORMAL HIGH (ref 65–99)
POTASSIUM: 3.5 mmol/L (ref 3.5–5.1)
SODIUM: 136 mmol/L (ref 135–145)

## 2017-01-30 LAB — PROTIME-INR
INR: 1.01
Prothrombin Time: 13.3 seconds (ref 11.4–15.2)

## 2017-01-30 LAB — GLUCOSE, CAPILLARY
GLUCOSE-CAPILLARY: 129 mg/dL — AB (ref 65–99)
GLUCOSE-CAPILLARY: 112 mg/dL — AB (ref 65–99)

## 2017-01-30 SURGERY — RIGHT HEART CATH
Anesthesia: LOCAL

## 2017-01-30 MED ORDER — HEPARIN (PORCINE) IN NACL 2-0.9 UNIT/ML-% IJ SOLN
INTRAMUSCULAR | Status: AC
  Filled 2017-01-30: qty 1000

## 2017-01-30 MED ORDER — MIDAZOLAM HCL 2 MG/2ML IJ SOLN
INTRAMUSCULAR | Status: DC | PRN
  Administered 2017-01-30: 0.5 mg via INTRAVENOUS

## 2017-01-30 MED ORDER — ONDANSETRON HCL 4 MG/2ML IJ SOLN: 4.0000 mg | Freq: Four times a day (QID) | INTRAMUSCULAR | Status: DC | PRN

## 2017-01-30 MED ORDER — SODIUM CHLORIDE 0.9 % IV SOLN
INTRAVENOUS | Status: DC
  Administered 2017-01-30: 11:00:00 via INTRAVENOUS

## 2017-01-30 MED ORDER — SODIUM CHLORIDE 0.9 % IV SOLN
250.0000 mL | INTRAVENOUS | Status: DC | PRN
Start: 2017-01-30 — End: 2017-01-30

## 2017-01-30 MED ORDER — SODIUM CHLORIDE 0.9% FLUSH: 3.0000 mL | INTRAVENOUS | Status: DC | PRN

## 2017-01-30 MED ORDER — FENTANYL CITRATE (PF) 100 MCG/2ML IJ SOLN
INTRAMUSCULAR | Status: DC | PRN
  Administered 2017-01-30: 25 ug via INTRAVENOUS

## 2017-01-30 MED ORDER — LIDOCAINE HCL 1 % IJ SOLN
INTRAMUSCULAR | Status: AC
  Filled 2017-01-30: qty 20

## 2017-01-30 MED ORDER — SODIUM CHLORIDE 0.9 % IV SOLN: 250.0000 mL | INTRAVENOUS | Status: DC | PRN

## 2017-01-30 MED ORDER — ASPIRIN 81 MG PO CHEW
81.0000 mg | CHEWABLE_TABLET | ORAL | Status: AC
  Administered 2017-01-30: 81 mg via ORAL

## 2017-01-30 MED ORDER — SODIUM CHLORIDE 0.9% FLUSH: 3.0000 mL | Freq: Two times a day (BID) | INTRAVENOUS | Status: DC

## 2017-01-30 MED ORDER — ASPIRIN 81 MG PO CHEW
CHEWABLE_TABLET | ORAL | Status: AC
  Administered 2017-01-30: 81 mg via ORAL
  Filled 2017-01-30: qty 1

## 2017-01-30 MED ORDER — FENTANYL CITRATE (PF) 100 MCG/2ML IJ SOLN
INTRAMUSCULAR | Status: AC
  Filled 2017-01-30: qty 2

## 2017-01-30 MED ORDER — HEPARIN (PORCINE) IN NACL 2-0.9 UNIT/ML-% IJ SOLN
INTRAMUSCULAR | Status: DC | PRN
  Administered 2017-01-30: 1000 mL

## 2017-01-30 MED ORDER — ACETAMINOPHEN 325 MG PO TABS: 650.0000 mg | ORAL_TABLET | ORAL | Status: DC | PRN

## 2017-01-30 MED ORDER — MIDAZOLAM HCL 2 MG/2ML IJ SOLN
INTRAMUSCULAR | Status: AC
  Filled 2017-01-30: qty 2

## 2017-01-30 MED ORDER — LIDOCAINE HCL (PF) 1 % IJ SOLN
INTRAMUSCULAR | Status: DC | PRN
  Administered 2017-01-30: 2 mL

## 2017-01-30 SURGICAL SUPPLY — 9 items
CATH BALLN WEDGE 5F 110CM (CATHETERS) ×1 IMPLANT
GUIDEWIRE .025 260CM (WIRE) ×1 IMPLANT
PACK CARDIAC CATHETERIZATION (CUSTOM PROCEDURE TRAY) ×2 IMPLANT
PROTECTION STATION PRESSURIZED (MISCELLANEOUS) ×2
SHEATH GLIDE SLENDER 4/5FR (SHEATH) ×1 IMPLANT
STATION PROTECTION PRESSURIZED (MISCELLANEOUS) IMPLANT
TRANSDUCER W/STOPCOCK (MISCELLANEOUS) ×2 IMPLANT
TUBING ART PRESS 72  MALE/FEM (TUBING) ×1
TUBING ART PRESS 72 MALE/FEM (TUBING) IMPLANT

## 2017-01-30 NOTE — Interval H&P Note (Signed)
History and Physical Interval Note:  01/30/2017 11:29 AM  [name]  has presented today for surgery, with the diagnosis of hp  The various methods of treatment have been discussed with the patient and family. After consideration of risks, benefits and other options for treatment, the patient has consented to  Procedure(s): Right Heart Cath (N/A) as a surgical intervention .  The patient's history has been reviewed, patient examined, no change in status, stable for surgery.  I have reviewed the patient's chart and labs.  Questions were answered to the patient's satisfaction.     [name] [employer]

## 2017-01-30 NOTE — H&P (View-Only) (Signed)
PCP: Dr Burns Cardiology: Dr Aundray Cartlidge  45 yo referred by PA Gray (rheumatology) for pulmonary hypertension evaluation.   Patient has history of morbid obesity, type II diabetes, and oxygen-dependent interstitial lung disease.  She was diagnosed with ILD by open lung biopsy in 2005.  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  She has been on prednisone long-term.  She uses oxygen with exertion and at night.  She is followed by rheumatology, no definite diagnosis, but ANA and anti-Smith are positive.  She has chronic polyarthralgias.   She generally does not get short of breath if she wears her oxygen with activity.  She can walk > 1 mile on a treadmill in 30 minutes.  Mild dyspnea with stairs.  No lightheadedness/syncope. No chest pain.  Able to shower, dress, do housework.  BP is high today, normally runs in 130s/80s when she checks at home.   She was noted on recent high resolution CT chest to have a dilated PA concerning for pulmonary hypertension.  Echo done today was reviewed.  Unable to estimate PA pressure from the study.  The RV is normal in size with mildly decreased systolic function.  Normal LV EF.  She was referred for pulmonary hypertension evaluation.   Labs (2/18): K 4.9, creatinine 0.6  ECG (2/18, personally reviewed): NSR, low voltage  PMH: 1. GERD 2. HTN 3. Morbid obesity 4. Type II diabetes 5. 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  She has been treated with steroids. Uses oxygen at night and with activity.  - High resolution CT chest 1/18: ILD without frank honeycombing, dilated PA.  6. Hyperlipidemia 7. Echo (4/18): EF 60-65%, normal RV size with mildly decreased systolic function, PA pressure not able to be adequately estimated.  8. Rheumatological workup: ANA positive 1:640, anti-Smith positive, anti-dsDNA negative, SCL-70 negative, RF negative.   Social History   Social History  . Marital status: Married    Spouse name: N/A  .  Number of children: 1  . Years of education: N/A   Occupational History  . 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 Smoking status: Never Smoker  . Smokeless tobacco: Never Used     Comment: {  . Alcohol use No  . Drug use: No  . Sexual activity: Not Currently   Other Topics Concern  . 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 Sarcoidosis Mother     dxed by transbrochial bx  . Allergies Mother   . Heart disease Father   . Diabetes Father   . Allergies Father   . Coronary artery disease Father   . Cancer Maternal Grandmother     CA of unknown type  . Cancer Paternal Grandmother     CA of unknown type   ROS: All systems reviewed and negative except as per HPI  Current Outpatient Prescriptions  Medication Sig Dispense Refill  . albuterol (PROVENTIL HFA;VENTOLIN HFA) 108 (90 Base) MCG/ACT inhaler Inhale 2 puffs into the lungs every 6 (six) hours as needed for wheezing or shortness of breath.    . Ascorbic Acid (VITAMIN C PO) Take 1 tablet by mouth daily.     . aspirin EC 81 MG tablet Take 1 tablet (81   mg total) by mouth daily. 150 tablet 2  . atorvastatin (LIPITOR) 20 MG tablet TAKE 1 TABLET (20 MG TOTAL) BY MOUTH DAILY -- Office visit needed for further refills 90 tablet 0  . Blood Glucose Monitoring Suppl (ONE TOUCH ULTRA MINI) W/DEVICE KIT Use as directed to check blood sugar twice a day. Dx 250.00 1 each 0  . fluticasone (FLONASE) 50 MCG/ACT nasal spray Place 2 sprays into both nostrils daily as needed for allergies or rhinitis.    . glucose blood (ONE TOUCH ULTRA TEST) test strip Use to check blood sugars twice a day. 100 each 4  . Insulin Detemir (LEVEMIR FLEXPEN) 100 UNIT/ML Pen Inject 30 Units into the skin daily. (Patient taking differently: Inject 30 Units into the  skin at bedtime. ) 15 mL 5  . Insulin Pen Needle (B-D ULTRAFINE III SHORT PEN) 31G X 8 MM MISC USE NEEDLE AS NEEDED TO INJECTED INSULIN AS PRESCRIBED BY PHYSICIAN 100 each 2  . IRON PO Take 65 mg by mouth daily.    . loratadine (CLARITIN) 10 MG tablet Take 10 mg by mouth daily as needed (nasal drainage, drip).    . mometasone-formoterol (DULERA) 100-5 MCG/ACT AERO Take 2 puffs first thing in am and then another 2 puffs about 12 hours later. 1 Inhaler 11  . montelukast (SINGULAIR) 10 MG tablet Take 1 tablet (10 mg total) by mouth at bedtime. 30 tablet 5  . ONE TOUCH LANCETS MISC Use as directed to help check blood sugars twice daily. Dx 250.00 60 each 5  . oxybutynin (DITROPAN XL) 10 MG 24 hr tablet Take 1 tablet (10 mg total) by mouth at bedtime. (Patient taking differently: Take 10 mg by mouth at bedtime as needed (For overactive bladder.). ) 90 tablet 1  . OXYGEN-HELIUM IN Inhale 2-3 L into the lungs daily.     . pantoprazole (PROTONIX) 40 MG tablet Take 1 tablet (40 mg total) by mouth 2 (two) times daily before a meal. 60 tablet 11  . predniSONE (DELTASONE) 1 MG tablet Take 8 mg by mouth daily with breakfast.    . SitaGLIPtin-MetFORMIN HCl (JANUMET XR) 100-1000 MG TB24 Take 1 tablet by mouth daily. 90 tablet 1  . TURMERIC PO Take 1 capsule by mouth daily.      No current facility-administered medications for this encounter.    BP (!) 149/104   Pulse 73   Wt 276 lb 4 oz (125.3 kg)   SpO2 98%   BMI 53.95 kg/m  General: NAD, obese Neck: JVP 8 cm, no thyromegaly or thyroid nodule.  Lungs: Clear to auscultation bilaterally with normal respiratory effort. CV: Nondisplaced PMI.  Heart regular S1/S2, no S3/S4, no murmur.  No peripheral edema.  No carotid bruit.  Normal pedal pulses.  Abdomen: Soft, nontender, no hepatosplenomegaly, no distention.  Skin: Intact without lesions or rashes.  Neurologic: Alert and oriented x 3.  Psych: Normal affect. Extremities: No clubbing or cyanosis.  HEENT:  Normal.   Assessment/Plan: 1. 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 She has been on prednisone and oxygen at night and with exertion.  Possible that this could be related to a rheumatological disease, though no definite diagnosis has been given.  She has polyarthralgias, positive ANA, and positive anti-Smith.  2. Pulmonary hypertension: Possible diagnosis.  She has a dilated PA which is concerning.  Unable to estimate PA pressure accurately from echo, but the RV is mildly hypokinetic.  Treatment of PAH related to ILD   alone with pulmonary vasodilators is somewhat controversial.  However, I am concerned that she may have a rheumatological diagnosis and thus group 1 PH.  I think we need a definitive diagnosis +/- PH, so think that she will need RHC.  We discussed risks/benefits today and she agrees to proceed with RHC.  I will arrange for next week.  3. HTN: BP elevated today but runs in normal range when she checks at home. Will need to follow over time.   Loralie Champagne 01/24/2017

## 2017-01-30 NOTE — Discharge Instructions (Signed)
Hold Janumet until Saturday    Angiogram, Care After This sheet gives you information about how to care for yourself after your procedure. Your doctor may also give you more specific instructions. If you have problems or questions, contact your doctor. Follow these instructions at home: Insertion site care   Follow instructions from your doctor about how to take care of your long, thin tube (catheter) insertion area. Make sure you:  Wash your hands with soap and water before you change your bandage (dressing). If you cannot use soap and water, use hand sanitizer.  Change your bandage as told by your doctor.  Leave stitches (sutures), skin glue, or skin tape (adhesive) strips in place. They may need to stay in place for 2 weeks or longer. If tape strips get loose and curl up, you may trim the loose edges. Do not remove tape strips completely unless your doctor says it is okay.  Do not take baths, swim, or use a hot tub until your doctor says it is okay.  You may shower 24 hours after the procedure or as told by your doctor.  Gently wash the area with plain soap and water.  Pat the area dry with a clean towel.  Do not rub the area. This may cause bleeding.  Do not apply powder or lotion to the area. Keep the area clean and dry.  Check your insertion area every day for signs of infection. Check for:  More redness, swelling, or pain.  Fluid or blood.  Warmth.  Pus or a bad smell. Activity   Rest as told by your doctor, usually for 1-2 days.  Do not lift anything that is heavier than 10 lbs. (4.5 kg) or as told by your doctor.  Do not drive for 24 hours if you were given a medicine to help you relax (sedative).  Do not drive or use heavy machinery while taking prescription pain medicine. General instructions   Go back to your normal activities as told by your doctor, usually in about a week. Ask your doctor what activities are safe for you.  If the insertion area starts  to bleed, lie flat and put pressure on the area. If the bleeding does not stop, get help right away. This is an emergency.  Drink enough fluid to keep your pee (urine) clear or pale yellow.  Take over-the-counter and prescription medicines only as told by your doctor.  Keep all follow-up visits as told by your doctor. This is important. Contact a doctor if:  You have a fever.  You have chills.  You have more redness, swelling, or pain around your insertion area.  You have fluid or blood coming from your insertion area.  The insertion area feels warm to the touch.  You have pus or a bad smell coming from your insertion area.  You have more bruising around the insertion area.  Blood collects in the tissue around the insertion area (hematoma) that may be painful to the touch. Get help right away if:  You have a lot of pain in the insertion area.  The insertion area swells very fast.  The insertion area is bleeding, and the bleeding does not stop after holding steady pressure on the area.  The area near or just beyond the insertion area becomes pale, cool, tingly, or numb. These symptoms may be an emergency. Do not wait to see if the symptoms will go away. Get medical help right away. Call your local emergency services (911 in  the [place] Do not drive yourself to the hospital. Summary  After the procedure, it is common to have bruising and tenderness at the long, thin tube insertion area.  After the procedure, it is important to rest and drink plenty of fluids.  Do not take baths, swim, or use a hot tub until your doctor says it is okay to do so. You may shower 24-48 hours after the procedure or as told by your doctor.  If the insertion area starts to bleed, lie flat and put pressure on the area. If the bleeding does not stop, get help right away. This is an emergency. This information is not intended to replace advice given to you by your health care provider. Make sure you  discuss any questions you have with your health care provider. Document Released: 12/14/2008 Document Revised: 09/11/2016 Document Reviewed: 09/11/2016 Elsevier Interactive Patient Education  2017 [employer].

## 2017-02-17 ENCOUNTER — Ambulatory Visit (INDEPENDENT_AMBULATORY_CARE_PROVIDER_SITE_OTHER): Payer: BC Managed Care – PPO

## 2017-02-17 ENCOUNTER — Encounter (HOSPITAL_COMMUNITY): Payer: Self-pay | Admitting: *Deleted

## 2017-02-17 ENCOUNTER — Ambulatory Visit (HOSPITAL_COMMUNITY)
Admission: EM | Admit: 2017-02-17 | Discharge: 2017-02-17 | Disposition: A | Discharge status: Home / Self Care | Payer: BC Managed Care – PPO | Attending: Internal Medicine | Admitting: Internal Medicine

## 2017-02-17 DIAGNOSIS — M2142 Flat foot [pes planus] (acquired), left foot: Secondary | ICD-10-CM

## 2017-02-17 DIAGNOSIS — M2141 Flat foot [pes planus] (acquired), right foot: Secondary | ICD-10-CM

## 2017-02-17 DIAGNOSIS — M79671 Pain in right foot: Secondary | ICD-10-CM | POA: Diagnosis not present

## 2017-02-17 DIAGNOSIS — M79672 Pain in left foot: Secondary | ICD-10-CM | POA: Diagnosis not present

## 2017-02-17 MED ORDER — HYDROCODONE-ACETAMINOPHEN 5-325 MG PO TABS: 1.0000 | ORAL_TABLET | Freq: Four times a day (QID) | ORAL | 0 refills | Status: DC | PRN

## 2017-02-17 MED ORDER — ONDANSETRON 4 MG PO TBDP: 4.0000 mg | ORAL_TABLET | Freq: Three times a day (TID) | ORAL | 0 refills | Status: DC | PRN

## 2017-02-17 NOTE — Discharge Instructions (Signed)
I recommend the use of orthotics in your shoes. I also recommend wrapping your ankle with Ace bandages, or wearing compression stockings. I have also prescribed a medicine for pain called hydrocodone, this medicine is a narcotic, it will cause drowsiness, and it is addictive. Do not take more than what is necessary, do not drink alcohol while taking, and do not operate any heavy machinery while taking this medicine. For swelling and inflammation, he may have over-the-counter ibuprofen every 6 hours. I have provided the contact information for a podiatrist, contact his office to schedule an appointment  for follow-up care

## 2017-02-17 NOTE — ED Triage Notes (Signed)
Denies injury.  C/O right dorsal foot pain x 3 wks.  Now also having bilat ankle pain and swelling also.  CMS intact.

## 2017-02-17 NOTE — ED Provider Notes (Signed)
CSN: [id number]     Arrival date & time 02/17/17  1706 History   None    No chief complaint on file.  (Consider location/radiation/quality/duration/timing/severity/associated sxs/prior Treatment) 46 year old female with a past history of obesity, GERD, and arthritis presents to clinic for chief complaint of bilateral ankle pain ongoing for 3 weeks, worse with the right ankle. States she takes a small dose of prednisone daily for arthritis, and that the pain has gotten to the point where she has difficulty walking. She states she's tried Tylenol, ibuprofen, diclofenac gel, all without relief. She denies any trauma, or any recent history of injuries. She's had no fever, chills, or loss of appetite.   The history is provided by the patient.    Past Medical History:  Diagnosis Date  . Allergic rhinitis   . Bronchitis   . Chronic rhinitis   . Cough   . Dyslipidemia   . Dysphagia   . GERD (gastroesophageal reflux disease)   . Headache(784.0)   . ILD (interstitial lung disease) ([place])   . Mild depression ([place])   . Morbid obesity ([place])   . Type II or diabetes mellitus, uncontrolled 11/2012 dx   Dx 12/08/12: a1c 10.0  . Unspecified essential hypertension   . Urge incontinence    Past Surgical History:  Procedure Laterality Date  . ABDOMINAL HYSTERECTOMY    . BILATERAL VATS ABLATION  02/15/04  . CESAREAN SECTION    . LUNG BIOPSY    . RIGHT HEART CATH N/A 01/30/2017   Procedure: Right Heart Cath;  Surgeon: Larey Dresser, MD;  Location: [facility];  Service: Cardiovascular;  Laterality: N/A;   Family History  Problem Relation Age of Onset  . Sarcoidosis Mother        dxed by transbrochial bx  . Allergies Mother   . Heart disease Father   . Diabetes Father   . Allergies Father   . Coronary artery disease Father   . Cancer Maternal Grandmother        CA of unknown type  . Cancer Paternal Grandmother        CA of unknown type   Social History  Substance Use Topics  .  Smoking status: Never Smoker  . Smokeless tobacco: Never Used     Comment: {  . Alcohol use No   OB History    No data available     Review of Systems  Constitutional: Negative.   HENT: Negative.   Respiratory: Negative.   Cardiovascular: Positive for leg swelling. Negative for chest pain and palpitations.  Gastrointestinal: Negative.   Musculoskeletal: Positive for joint swelling.  Skin: Negative.   Neurological: Negative.     Allergies  Penicillins and Peach flavor  Home Medications   Prior to Admission medications   Medication Sig Start Date End Date Taking? Authorizing Provider  albuterol (PROVENTIL HFA;VENTOLIN HFA) 108 (90 Base) MCG/ACT inhaler Inhale 2 puffs into the lungs every 6 (six) hours as needed for wheezing or shortness of breath.   Yes [provider]  aspirin 81 MG chewable tablet Chew 81 mg by mouth daily.   Yes [provider]  aspirin-acetaminophen-caffeine (EXCEDRIN MIGRAINE) [phone] MG tablet Take 2 tablets by mouth daily as needed for migraine.   Yes [provider]  atorvastatin (LIPITOR) 20 MG tablet TAKE 1 TABLET (20 MG TOTAL) BY MOUTH DAILY -- Office visit needed for further refills Patient taking differently: Take 20 mg by mouth every evening.  01/23/17  Yes Billey Gosling  J, MD  B COMPLEX VITAMINS SL Place 1 mL under the tongue 4 (four) times a week.   Yes [provider]  Blood Glucose Monitoring Suppl (ONE TOUCH ULTRA MINI) W/DEVICE KIT Use as directed to check blood sugar twice a day. Dx 250.00 12/11/12  Yes Leschber, Valerie A, MD  Cholecalciferol (VITAMIN D3) 5000 units CAPS Take 5,000 Units by mouth daily.   Yes [provider]  diphenhydrAMINE (BENADRYL) 25 MG tablet Take 25-50 mg by mouth at bedtime as needed for allergies.   Yes [provider]  ferrous sulfate 325 (65 FE) MG EC tablet Take 325 mg by mouth daily.   Yes [provider]  fluticasone (FLONASE) 50 MCG/ACT nasal spray  Place 2 sprays into both nostrils daily as needed for allergies or rhinitis.   Yes [provider]  glucose blood (ONE TOUCH ULTRA TEST) test strip Use to check blood sugars twice a day. 06/20/16  Yes Burns, Stacy J, MD  Insulin Detemir (LEVEMIR FLEXPEN) 100 UNIT/ML Pen Inject 30 Units into the skin daily. Patient taking differently: Inject 30 Units into the skin every evening.  06/27/16  Yes Burns, Stacy J, MD  Insulin Pen Needle (B-D ULTRAFINE III SHORT PEN) 31G X 8 MM MISC USE NEEDLE AS NEEDED TO INJECTED INSULIN AS PRESCRIBED BY PHYSICIAN 08/16/16  Yes Burns, Stacy J, MD  loratadine (CLARITIN) 10 MG tablet Take 10 mg by mouth daily.    Yes [provider]  mometasone-formoterol (DULERA) 100-5 MCG/ACT AERO Take 2 puffs first thing in am and then another 2 puffs about 12 hours later. 03/09/15  Yes Wert, Michael B, MD  montelukast (SINGULAIR) 10 MG tablet Take 1 tablet (10 mg total) by mouth at bedtime. 02/02/16  Yes Burns, Stacy J, MD  ONE TOUCH LANCETS MISC Use as directed to help check blood sugars twice daily. Dx 250.00 12/10/12  Yes Leschber, Valerie A, MD  pantoprazole (PROTONIX) 40 MG tablet Take 1 tablet (40 mg total) by mouth 2 (two) times daily before a meal. 08/31/16  Yes Wert, Michael B, MD  predniSONE (DELTASONE) 1 MG tablet Take 3 mg by mouth daily with breakfast. Take with 5mg tablet to total 8mgs once daily   Yes [provider]  predniSONE (DELTASONE) 5 MG tablet Take 5 mg by mouth daily with breakfast. Take with three 1mg tablets to total 8mgs every morning   Yes [provider]  SitaGLIPtin-MetFORMIN HCl (JANUMET XR) 100-1000 MG TB24 Take 1 tablet by mouth daily. 12/12/16  Yes Burns, Stacy J, MD  Tetrahydrozoline-Zn Sulfate (VISINE-AC OP) Apply 1 drop to eye daily as needed (allergies).   Yes [provider]  Turmeric 500 MG CAPS Take 500 mg by mouth daily.   Yes [provider]  HYDROcodone-acetaminophen (NORCO/VICODIN) 5-325 MG tablet  Take 1 tablet by mouth every 6 (six) hours as needed. 02/17/17   Kennard, Lawrence, NP   Meds Ordered and Administered this Visit  Medications - No data to display  Pulse 95   Temp 97.6 F (36.4 C) (Oral)   Resp 16   SpO2 100%  No data found.   Physical Exam  Constitutional: She is oriented to person, place, and time. She appears well-developed and well-nourished. No distress.  HENT:  Head: Normocephalic and atraumatic.  Right Ear: External ear normal.  Left Ear: External ear normal.  Eyes: Conjunctivae are normal.  Neck: Normal range of motion.  Musculoskeletal:  Bilateral ankle swelling noted, no tenderness of the medial malleolus   lateral malleolus navicular, fifth metatarsal, dorsalis pedis pulse remains intact, no erythema noted, subjectively, the arches do appear to be somewhat flattened.  Neurological: She is alert and oriented to person, place, and time.  Skin: Skin is warm and dry. Capillary refill takes less than 2 seconds. No rash noted. She is not diaphoretic. No erythema.  Psychiatric: She has a normal mood and affect. Her behavior is normal.  Nursing note and vitals reviewed.   Urgent Care Course     Procedures (including critical care time)  Labs Review Labs Reviewed - No data to display  Imaging Review Dg Ankle Complete Left  Result Date: 02/17/2017 CLINICAL DATA:  Subacute onset of bilateral ankle pain and swelling. Initial encounter. EXAM: LEFT ANKLE COMPLETE - 3+ VIEW COMPARISON:  None. FINDINGS: There is no evidence of fracture or dislocation. The ankle mortise is intact; the interosseous space is within normal limits. No talar tilt or subluxation is seen. Pes planus is noted. A plantar calcaneal spur is seen. The joint spaces are preserved. Diffuse soft tissue swelling is noted. IMPRESSION: 1. No evidence of fracture or dislocation. 2. Pes planus noted. Electronically Signed   By: Garald Balding M.D.   On: 02/17/2017 20:06   Dg Ankle Complete  Right  Result Date: 02/17/2017 CLINICAL DATA:  Subacute onset of bilateral ankle pain and swelling. Initial encounter. EXAM: RIGHT ANKLE - COMPLETE 3+ VIEW COMPARISON:  None. FINDINGS: There is no evidence of fracture or dislocation. The ankle mortise is intact; the interosseous space is within normal limits. No talar tilt or subluxation is seen. Pes planus is noted. A plantar calcaneal spur is seen. A degenerative osseous fragment is noted overlying the posterior malleolus. The joint spaces are preserved. Diffuse soft tissue swelling is noted about the ankle. IMPRESSION: 1. No evidence of fracture or dislocation. 2. Pes planus noted. Electronically Signed   By: Garald Balding M.D.   On: 02/17/2017 20:08      MDM   1. Flat feet, bilateral    Bilateral flat feet, recommend use of orthotics, wrapping feet in Ace bandages, or wearing compression stockings for the swelling, short course of norco for pain, follow up with podiatry  [facility] Controlled substances reporting system consulted prior to issuing prescription.     Barnet Glasgow, NP 02/17/17 2025

## 2017-04-08 DIAGNOSIS — R768 Other specified abnormal immunological findings in serum: Secondary | ICD-10-CM | POA: Diagnosis not present

## 2017-04-08 DIAGNOSIS — M25511 Pain in right shoulder: Secondary | ICD-10-CM | POA: Diagnosis not present

## 2017-04-08 DIAGNOSIS — M791 Myalgia: Secondary | ICD-10-CM | POA: Diagnosis not present

## 2017-04-08 DIAGNOSIS — J849 Interstitial pulmonary disease, unspecified: Secondary | ICD-10-CM | POA: Diagnosis not present

## 2017-04-08 DIAGNOSIS — Z6841 Body Mass Index (BMI) 40.0 and over, adult: Secondary | ICD-10-CM | POA: Diagnosis not present

## 2017-04-08 DIAGNOSIS — M2550 Pain in unspecified joint: Secondary | ICD-10-CM | POA: Diagnosis not present

## 2017-04-08 DIAGNOSIS — H0520 Unspecified exophthalmos: Secondary | ICD-10-CM | POA: Diagnosis not present

## 2017-04-08 DIAGNOSIS — I288 Other diseases of pulmonary vessels: Secondary | ICD-10-CM | POA: Diagnosis not present

## 2017-05-01 ENCOUNTER — Encounter (HOSPITAL_COMMUNITY): Payer: Self-pay | Admitting: Emergency Medicine

## 2017-05-01 ENCOUNTER — Emergency Department (HOSPITAL_COMMUNITY)
Admission: EM | Admit: 2017-05-01 | Discharge: 2017-05-01 | Disposition: A | Discharge status: Home / Self Care | Payer: Medicare Other | Attending: Emergency Medicine | Admitting: Emergency Medicine

## 2017-05-01 ENCOUNTER — Emergency Department (HOSPITAL_COMMUNITY): Payer: Medicare Other

## 2017-05-01 DIAGNOSIS — R059 Cough, unspecified: Secondary | ICD-10-CM

## 2017-05-01 DIAGNOSIS — R05 Cough: Secondary | ICD-10-CM | POA: Diagnosis not present

## 2017-05-01 DIAGNOSIS — I10 Essential (primary) hypertension: Secondary | ICD-10-CM | POA: Insufficient documentation

## 2017-05-01 DIAGNOSIS — Z794 Long term (current) use of insulin: Secondary | ICD-10-CM | POA: Diagnosis not present

## 2017-05-01 DIAGNOSIS — J9600 Acute respiratory failure, unspecified whether with hypoxia or hypercapnia: Secondary | ICD-10-CM | POA: Diagnosis not present

## 2017-05-01 DIAGNOSIS — E119 Type 2 diabetes mellitus without complications: Secondary | ICD-10-CM | POA: Diagnosis not present

## 2017-05-01 DIAGNOSIS — Z7982 Long term (current) use of aspirin: Secondary | ICD-10-CM | POA: Diagnosis not present

## 2017-05-01 DIAGNOSIS — Z79899 Other long term (current) drug therapy: Secondary | ICD-10-CM | POA: Diagnosis not present

## 2017-05-01 DIAGNOSIS — J189 Pneumonia, unspecified organism: Secondary | ICD-10-CM | POA: Insufficient documentation

## 2017-05-01 LAB — BASIC METABOLIC PANEL
ANION GAP: 10 (ref 5–15)
BUN: 11 mg/dL (ref 6–20)
CALCIUM: 9.2 mg/dL (ref 8.9–10.3)
CO2: 27 mmol/L (ref 22–32)
Chloride: 100 mmol/L — ABNORMAL LOW (ref 101–111)
Creatinine, Ser: 0.6 mg/dL (ref 0.44–1.00)
GFR calc Af Amer: >60 mL/min (ref >60)
GLUCOSE: 233 mg/dL — AB (ref 65–99)
Potassium: 3.8 mmol/L (ref 3.5–5.1)
Sodium: 137 mmol/L (ref 135–145)

## 2017-05-01 LAB — CBC WITH DIFFERENTIAL/PLATELET
BASOS ABS: 0 10*3/uL (ref 0.0–0.1)
BASOS PCT: 0 %
Eosinophils Absolute: 0.2 10*3/uL (ref 0.0–0.7)
Eosinophils Relative: 2 %
HEMATOCRIT: 40.1 % (ref 36.0–46.0)
HEMOGLOBIN: 13.2 g/dL (ref 12.0–15.0)
LYMPHS PCT: 25 %
Lymphs Abs: 2.9 10*3/uL (ref 0.7–4.0)
MCH: 29.2 pg (ref 26.0–34.0)
MCHC: 32.9 g/dL (ref 30.0–36.0)
MCV: 88.7 fL (ref 78.0–100.0)
MONO ABS: 0.6 10*3/uL (ref 0.1–1.0)
Monocytes Relative: 5 %
NEUTROS ABS: 8.1 10*3/uL — AB (ref 1.7–7.7)
Neutrophils Relative %: 68 %
Platelets: 255 10*3/uL (ref 150–400)
RBC: 4.52 MIL/uL (ref 3.87–5.11)
RDW: 14.1 % (ref 11.5–15.5)
WBC: 11.9 10*3/uL — ABNORMAL HIGH (ref 4.0–10.5)

## 2017-05-01 MED ORDER — FLUCONAZOLE 200 MG PO TABS: 200.0000 mg | ORAL_TABLET | Freq: Every day | ORAL | 0 refills | Status: DC

## 2017-05-01 MED ORDER — IPRATROPIUM-ALBUTEROL 0.5-2.5 (3) MG/3ML IN SOLN
3.0000 mL | Freq: Once | RESPIRATORY_TRACT | Status: AC
  Administered 2017-05-01: 3 mL via RESPIRATORY_TRACT
  Filled 2017-05-01: qty 3

## 2017-05-01 MED ORDER — DEXAMETHASONE SODIUM PHOSPHATE 10 MG/ML IJ SOLN
10.0000 mg | Freq: Once | INTRAMUSCULAR | Status: AC
  Administered 2017-05-01: 10 mg via INTRAVENOUS

## 2017-05-01 MED ORDER — BENZONATATE 100 MG PO CAPS: 100.0000 mg | ORAL_CAPSULE | Freq: Three times a day (TID) | ORAL | 0 refills | Status: DC

## 2017-05-01 MED ORDER — DEXAMETHASONE SODIUM PHOSPHATE 10 MG/ML IJ SOLN
10.0000 mg | Freq: Once | INTRAMUSCULAR | Status: DC
  Filled 2017-05-01: qty 1

## 2017-05-01 MED ORDER — LEVOFLOXACIN 750 MG PO TABS: 750.0000 mg | ORAL_TABLET | Freq: Every day | ORAL | 0 refills | Status: DC

## 2017-05-01 MED ORDER — LEVOFLOXACIN 750 MG PO TABS: 750.0000 mg | ORAL_TABLET | Freq: Every day | ORAL | 0 refills | Status: AC

## 2017-05-01 NOTE — ED Triage Notes (Signed)
Pt states that she has had a cough x 1 week that has caused her to loose her voice. Productive with green mucus. Alert and oriented

## 2017-05-01 NOTE — Discharge Instructions (Addendum)
Please read attached information regarding your condition. Take Levaquin once daily for 5 days. Continue other home medications as previously prescribed. Follow-up with PCP and pulmonologist for further evaluation. Return to ED for chest pain, shortness of breath, increasing cough, high fevers, leg swelling, signs of infection.

## 2017-05-01 NOTE — ED Notes (Signed)
ED Provider at bedside. 

## 2017-05-01 NOTE — ED Provider Notes (Signed)
[facility] DEPT Provider Note   CSN: [id number] Arrival date & time: 05/01/17  6294     History   Chief Complaint Chief Complaint  Patient presents with  . Cough    HPI [name] is a 46 y.o. female.  HPI  Patient, with a past medical history of type 2 diabetes, interstitial lung disease, presents to ED for evaluation of productive cough for the past week. Reports cough is productive with yellowish green sputum. She reports similar episodes this time of year due to her lung disease. She has tried Mucinex, DayQuil, inhaler, other cough medications with no relief in her symptoms.  She is currently on 5 mg prednisone for her interstitial lung disease. She denies any chest pain, hemoptysis, fever, leg swelling, prior DVT or PE, recent surgeries.  Past Medical History:  Diagnosis Date  . Allergic rhinitis   . Bronchitis   . Chronic rhinitis   . Cough   . Dyslipidemia   . Dysphagia   . GERD (gastroesophageal reflux disease)   . Headache(784.0)   . ILD (interstitial lung disease) ([place])   . Mild depression ([place])   . Morbid obesity ([place])   . Type II or diabetes mellitus, uncontrolled 11/2012 dx   Dx 12/08/12: a1c 10.0  . Unspecified essential hypertension   . Urge incontinence     Patient Active Problem List   Diagnosis Date Noted  . Influenza 11/06/2016  . Skin lesion 10/29/2016  . Cellulitis of right upper extremity 10/29/2016  . Knee pain, left 02/02/2016  . Cough variant asthma 03/09/2015  . Diabetes type 2, uncontrolled ([place]) 12/09/2012  . Dyslipidemia   . Acute on chronic respiratory failure with hypoxia ([place]) 04/05/2012  . CONJUNCTIVITIS, ALLERGIC, SEASONAL 01/05/2010  . Allergic rhinitis 01/05/2010  . Essential hypertension 09/07/2009  . MUSCLE SPASM 09/07/2009  . DEPRESSION, MILD 03/24/2009  . HEADACHE 03/24/2009  . URINARY INCONTINENCE, URGE 03/24/2009  . CHRONIC RHINITIS 12/23/2008  . Severe obesity (BMI >= 40) ([place]) 10/10/2007  . INTERSTITIAL LUNG  DISEASE 10/10/2007  . GASTROESOPHAGEAL REFLUX DISEASE 10/10/2007    Past Surgical History:  Procedure Laterality Date  . ABDOMINAL HYSTERECTOMY    . BILATERAL VATS ABLATION  02/15/04  . CESAREAN SECTION    . LUNG BIOPSY    . RIGHT HEART CATH N/A 01/30/2017   Procedure: Right Heart Cath;  Surgeon: Larey Dresser, MD;  Location: [facility];  Service: Cardiovascular;  Laterality: N/A;    OB History    No data available       Home Medications    Prior to Admission medications   Medication Sig Start Date End Date Taking? Authorizing Provider  albuterol (PROVENTIL HFA;VENTOLIN HFA) 108 (90 Base) MCG/ACT inhaler Inhale 2 puffs into the lungs every 6 (six) hours as needed for wheezing or shortness of breath.    [provider]  aspirin 81 MG chewable tablet Chew 81 mg by mouth daily.    [provider]  aspirin-acetaminophen-caffeine (EXCEDRIN MIGRAINE) [phone] MG tablet Take 2 tablets by mouth daily as needed for migraine.    [provider]  atorvastatin (LIPITOR) 20 MG tablet TAKE 1 TABLET (20 MG TOTAL) BY MOUTH DAILY -- Office visit needed for further refills Patient taking differently: Take 20 mg by mouth every evening.  01/23/17   Binnie Rail, MD  B COMPLEX VITAMINS SL Place 1 mL under the tongue 4 (four) times a week.    [provider]  benzonatate (TESSALON) 100 MG  capsule Take 1 capsule (100 mg total) by mouth every 8 (eight) hours. 05/01/17   Monque Haggar, PA-C  Cholecalciferol (VITAMIN D3) 5000 units CAPS Take 5,000 Units by mouth daily.    [provider]  diphenhydrAMINE (BENADRYL) 25 MG tablet Take 25-50 mg by mouth at bedtime as needed for allergies.    [provider]  ferrous sulfate 325 (65 FE) MG EC tablet Take 325 mg by mouth daily.    [provider]  fluconazole (DIFLUCAN) 200 MG tablet Take 1 tablet (200 mg total) by mouth daily. 05/01/17 05/08/17  Meliza Kage, PA-C  fluticasone (FLONASE) 50  MCG/ACT nasal spray Place 2 sprays into both nostrils daily as needed for allergies or rhinitis.    [provider]  HYDROcodone-acetaminophen (NORCO/VICODIN) 5-325 MG tablet Take 1 tablet by mouth every 6 (six) hours as needed. 02/17/17   Barnet Glasgow, NP  Insulin Detemir (LEVEMIR FLEXPEN) 100 UNIT/ML Pen Inject 30 Units into the skin daily. Patient taking differently: Inject 30 Units into the skin every evening.  06/27/16   Burns, Claudina Lick, MD  levofloxacin (LEVAQUIN) 750 MG tablet Take 1 tablet (750 mg total) by mouth daily. 05/01/17 05/06/17  Erinne Gillentine, PA-C  loratadine (CLARITIN) 10 MG tablet Take 10 mg by mouth daily.     [provider]  mometasone-formoterol (DULERA) 100-5 MCG/ACT AERO Take 2 puffs first thing in am and then another 2 puffs about 12 hours later. 03/09/15   Tanda Rockers, MD  montelukast (SINGULAIR) 10 MG tablet Take 1 tablet (10 mg total) by mouth at bedtime. 02/02/16   Binnie Rail, MD  ondansetron (ZOFRAN ODT) 4 MG disintegrating tablet Take 1 tablet (4 mg total) by mouth every 8 (eight) hours as needed for nausea or vomiting. 02/17/17   Barnet Glasgow, NP  pantoprazole (PROTONIX) 40 MG tablet Take 1 tablet (40 mg total) by mouth 2 (two) times daily before a meal. 08/31/16   Tanda Rockers, MD  predniSONE (DELTASONE) 1 MG tablet Take 3 mg by mouth daily with breakfast. Take with 5mg  tablet to total 8mg s once daily    [provider]  predniSONE (DELTASONE) 5 MG tablet Take 5 mg by mouth daily with breakfast. Take with three 1mg  tablets to total 8mg s every morning    [provider]  SitaGLIPtin-MetFORMIN HCl (JANUMET XR) [phone] MG TB24 Take 1 tablet by mouth daily. 12/12/16   Binnie Rail, MD  Tetrahydrozoline-Zn Sulfate (VISINE-AC OP) Apply 1 drop to eye daily as needed (allergies).    [provider]  Turmeric 500 MG CAPS Take 500 mg by mouth daily.    [provider]    Family History Family History  Problem  Relation Age of Onset  . Sarcoidosis Mother        dxed by transbrochial bx  . Allergies Mother   . Heart disease Father   . Diabetes Father   . Allergies Father   . Coronary artery disease Father   . Cancer Maternal Grandmother        CA of unknown type  . Cancer Paternal Grandmother        CA of unknown type    Social History Social History  Substance Use Topics  . Smoking status: Never Smoker  . Smokeless tobacco: Never Used     Comment: {  . Alcohol use No     Allergies   Penicillins and Peach flavor   Review of Systems Review of Systems  Constitutional:  Positive for fatigue. Negative for appetite change, chills and fever.  HENT: Negative for ear pain, rhinorrhea, sneezing and sore throat.   Eyes: Negative for photophobia and visual disturbance.  Respiratory: Positive for cough. Negative for chest tightness, shortness of breath and wheezing.   Cardiovascular: Negative for chest pain and palpitations.  Gastrointestinal: Negative for abdominal pain, blood in stool, constipation, diarrhea, nausea and vomiting.  Genitourinary: Negative for dysuria, hematuria and urgency.  Musculoskeletal: Negative for myalgias.  Skin: Negative for rash.  Neurological: Negative for dizziness, weakness and light-headedness.     Physical Exam Updated Vital Signs BP 130/90   Pulse (!) 108   Temp 98.1 F (36.7 C) (Oral)   Resp 15   Ht 5' (1.524 m)   Wt 125.6 kg (277 lb)   SpO2 100%   BMI 54.10 kg/m   Physical Exam  Constitutional: She appears well-developed and well-nourished. No distress.  HENT:  Head: Normocephalic and atraumatic.  Nose: Nose normal.  Eyes: Conjunctivae and EOM are normal. Left eye exhibits no discharge. No scleral icterus.  Neck: Normal range of motion. Neck supple.  Cardiovascular: Regular rhythm, normal heart sounds and intact distal pulses.  Tachycardia present.  Exam reveals no gallop and no friction rub.   No murmur heard. Pulmonary/Chest: Effort  normal. No respiratory distress. She has decreased breath sounds.  Overall decreased breath sounds. Crackles heard in the lower lung bases. No respiratory distress.  Abdominal: Soft. Bowel sounds are normal. She exhibits no distension. There is no tenderness. There is no guarding.  Musculoskeletal: Normal range of motion. She exhibits no edema.  Neurological: She is alert. She exhibits normal muscle tone. Coordination normal.  Skin: Skin is warm and dry. No rash noted.  Psychiatric: She has a normal mood and affect.  Nursing note and vitals reviewed.    ED Treatments / Results  Labs (all labs ordered are listed, but only abnormal results are displayed) Labs Reviewed  BASIC METABOLIC PANEL - Abnormal; Notable for the following:       Result Value   Chloride 100 (*)    Glucose, Bld 233 (*)    All other components within normal limits  CBC WITH DIFFERENTIAL/PLATELET - Abnormal; Notable for the following:    WBC 11.9 (*)    Neutro Abs 8.1 (*)    All other components within normal limits    EKG  EKG Interpretation None       Radiology Dg Chest 2 View  Result Date: 05/01/2017 CLINICAL DATA:  One week of chest congestion with increased hoarseness. History of interstitial lung disease, gastroesophageal reflux, acute on chronic respiratory failure, diabetes, never smoked. EXAM: CHEST  2 VIEW COMPARISON:  PA and lateral chest x-ray of November 06, 2016 FINDINGS: The lungs are borderline hypoinflated. There are bibasilar densities. There is minimal blunting of the costophrenic angles. The cardiac silhouette is enlarged. The central pulmonary vascularity is prominent but stable. The mediastinum is normal in width. The bony thorax exhibits no acute abnormality. IMPRESSION: Bibasilar atelectasis or early pneumonia. This is superimposed upon chronic interstitial prominence diffusely. Cardiomegaly with central pulmonary vascular congestion but no definite pulmonary edema. Followup PA and lateral  chest X-ray is recommended in 3-4 weeks following trial of antibiotic therapy to ensure resolution and exclude underlying malignancy. Electronically Signed   By: David  Martinique M.D.   On: 05/01/2017 08:49    Procedures Procedures (including critical care time)  Medications Ordered in ED Medications  ipratropium-albuterol (DUONEB) 0.5-2.5 (3) MG/3ML nebulizer solution 3  mL (3 mLs Nebulization Given 05/01/17 0844)  dexamethasone (DECADRON) injection 10 mg (10 mg Intravenous Given 05/01/17 1014)     Initial Impression / Assessment and Plan / ED Course  I have reviewed the triage vital signs and the nursing notes.  Pertinent labs & imaging results that were available during my care of the patient were reviewed by me and considered in my medical decision making (see chart for details).     Patient presents to ED for evaluation of cough that has been going on for the past week. She states that cough is productive with yellowish green sputum. She has tried over-the-counter medications with no relief in her symptoms. She is currently on 5 mg of prednisone for her interstitial lung disease. She denies any fever, chest pain, hemoptysis, leg swelling, prior history of DVT or PE, MI, CHF. On physical exam there is overall tightness noted in bilateral lungs. She is not in respiratory distress, satting at 100% on room air. She is afebrile. X-ray showed possible early pneumonia. Patient reports much improvement in her symptoms with DuoNeb given here in the ED. Low suspicion for PE being the cause of her cough considering risk factors. Patient given IM Decadron. Will discharge home with Levaquin, Tessalon, diflucan due to her significant comorbidities for pneumonia and will advise patient to follow-up with PCP and pulmonologist for further evaluation.  Final Clinical Impressions(s) / ED Diagnoses   Final diagnoses:  Community acquired pneumonia, unspecified laterality  Cough    New Prescriptions Discharge  Medication List as of 05/01/2017  9:49 AM       Delia Heady, PA-C 05/01/17 [place], [place], DO 05/02/17 [phone]

## 2017-05-06 ENCOUNTER — Ambulatory Visit: Payer: BC Managed Care – PPO | Admitting: Internal Medicine

## 2017-05-08 NOTE — Progress Notes (Signed)
Subjective:    Patient ID: [name], female    DOB: [birth year], 46 y.o.   MRN: [id number]  HPI The patient is here for follow up.  Obesity:  She is on chronic steroids.  The dose is slowly being tapered and she is hoping that it will help with weight loss.  She really wants to work on weight loss.  Diabetes: She is taking her janumet daily as prescribed. She is not taking the levemir - she is sick of sticking herself.  She is fairly compliant with a diabetic diet. She is not exercising regularly. She monitors her sugars and they have been running 130-195. She checks her feet daily and denies foot lesions. She does get occasional tingling in her big toe when her sugars are too high.  She is up-to-date with an ophthalmology examination.   Hypertension: She is taking her medication daily. She is compliant with a low sodium diet.  She denies chest pain, palpitations, edema, shortness of breath and regular headaches. She is not exercising regularly.  She does not monitor her blood pressure at home.    Hyperlipidemia: She is taking her medication daily. She is compliant with a low fat/cholesterol diet. She is not exercising regularly. She denies myalgias.   Asthma, interstitial lung disease, CAP: she went to the ED the beginning of the month for productive cough x 1 week.  OTC meds were not helping.  She was tight on exam, but stable.  A CXR showed possible early pneumonia.  She improved significantly with duoneb. She received IM decadron.  She was discharged with levaquin, tessalon and diflucan.  The antibiotic did make her very nauseous.   She has some intermittent cough and wheeze since the pneumonia.  She has intermittent SOB since the PNA as well.  She is using her inhalers.    Medications and allergies reviewed with patient and updated if appropriate.  Patient Active Problem List   Diagnosis Date Noted  . Influenza 11/06/2016  . Skin lesion 10/29/2016  . Cellulitis of right upper  extremity 10/29/2016  . Knee pain, left 02/02/2016  . Cough variant asthma 03/09/2015  . Diabetes type 2, uncontrolled ([place]) 12/09/2012  . Dyslipidemia   . Acute on chronic respiratory failure with hypoxia ([place]) 04/05/2012  . CONJUNCTIVITIS, ALLERGIC, SEASONAL 01/05/2010  . Allergic rhinitis 01/05/2010  . Essential hypertension 09/07/2009  . MUSCLE SPASM 09/07/2009  . DEPRESSION, MILD 03/24/2009  . HEADACHE 03/24/2009  . URINARY INCONTINENCE, URGE 03/24/2009  . CHRONIC RHINITIS 12/23/2008  . Severe obesity (BMI >= 40) ([place]) 10/10/2007  . INTERSTITIAL LUNG DISEASE 10/10/2007  . GASTROESOPHAGEAL REFLUX DISEASE 10/10/2007    Current Outpatient Prescriptions on File Prior to Visit  Medication Sig Dispense Refill  . albuterol (PROVENTIL HFA;VENTOLIN HFA) 108 (90 Base) MCG/ACT inhaler Inhale 2 puffs into the lungs every 6 (six) hours as needed for wheezing or shortness of breath.    Marland Kitchen aspirin 81 MG chewable tablet Chew 81 mg by mouth daily.    Marland Kitchen aspirin-acetaminophen-caffeine (EXCEDRIN MIGRAINE) 250-250-65 MG tablet Take 2 tablets by mouth daily as needed for migraine.    Marland Kitchen atorvastatin (LIPITOR) 20 MG tablet TAKE 1 TABLET (20 MG TOTAL) BY MOUTH DAILY -- Office visit needed for further refills (Patient taking differently: Take 20 mg by mouth every evening. ) 90 tablet 0  . B COMPLEX VITAMINS SL Place 1 mL under the tongue 4 (four) times a week.    . Cholecalciferol (VITAMIN D3) 5000 units CAPS  Take 5,000 Units by mouth daily.    . diphenhydrAMINE (BENADRYL) 25 MG tablet Take 25-50 mg by mouth at bedtime as needed for allergies.    . ferrous sulfate 325 (65 FE) MG EC tablet Take 325 mg by mouth daily.    . fluticasone (FLONASE) 50 MCG/ACT nasal spray Place 2 sprays into both nostrils daily as needed for allergies or rhinitis.    . Insulin Detemir (LEVEMIR FLEXPEN) 100 UNIT/ML Pen Inject 30 Units into the skin daily. (Patient taking differently: Inject 30 Units into the skin every evening. ) 15  mL 5  . loratadine (CLARITIN) 10 MG tablet Take 10 mg by mouth daily.     . mometasone-formoterol (DULERA) 100-5 MCG/ACT AERO Take 2 puffs first thing in am and then another 2 puffs about 12 hours later. 1 Inhaler 11  . montelukast (SINGULAIR) 10 MG tablet Take 1 tablet (10 mg total) by mouth at bedtime. 30 tablet 5  . ondansetron (ZOFRAN ODT) 4 MG disintegrating tablet Take 1 tablet (4 mg total) by mouth every 8 (eight) hours as needed for nausea or vomiting. 20 tablet 0  . pantoprazole (PROTONIX) 40 MG tablet Take 1 tablet (40 mg total) by mouth 2 (two) times daily before a meal. 60 tablet 11  . predniSONE (DELTASONE) 1 MG tablet Take 3 mg by mouth daily with breakfast. Take with 5mg  tablet to total 8mg s once daily    . predniSONE (DELTASONE) 5 MG tablet Take 5 mg by mouth daily with breakfast. Take with three 1mg  tablets to total 8mg s every morning    . SitaGLIPtin-MetFORMIN HCl (JANUMET XR) [id number] MG TB24 Take 1 tablet by mouth daily. 90 tablet 1  . Tetrahydrozoline-Zn Sulfate (VISINE-AC OP) Apply 1 drop to eye daily as needed (allergies).    . Turmeric 500 MG CAPS Take 500 mg by mouth daily.    . [DISCONTINUED] DETROL LA 2 MG 24 hr capsule TAKE 1 CAPSULE BY MOUTH DAILY 30 capsule 10   No current facility-administered medications on file prior to visit.     Past Medical History:  Diagnosis Date  . Allergic rhinitis   . Bronchitis   . Chronic rhinitis   . Cough   . Dyslipidemia   . Dysphagia   . GERD (gastroesophageal reflux disease)   . Headache(784.0)   . ILD (interstitial lung disease) ([place])   . Mild depression ([place])   . Morbid obesity ([place])   . Type II or diabetes mellitus, uncontrolled 11/2012 dx   Dx 12/08/12: a1c 10.0  . Unspecified essential hypertension   . Urge incontinence     Past Surgical History:  Procedure Laterality Date  . ABDOMINAL HYSTERECTOMY    . BILATERAL VATS ABLATION  02/15/04  . CESAREAN SECTION    . LUNG BIOPSY    . RIGHT HEART CATH N/A 01/30/2017    Procedure: Right Heart Cath;  Surgeon: Larey Dresser, MD;  Location: [facility];  Service: Cardiovascular;  Laterality: N/A;    Social History   Social History  . Marital status: Married    Spouse name: N/A  . Number of children: 1  . Years of education: N/A   Occupational History  . 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 Smoking status: Never Smoker  . Smokeless tobacco: Never Used     Comment: {  . Alcohol use No  . Drug use: No  .  Sexual activity: Not on file   Other Topics Concern  . 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 Sarcoidosis Mother        dxed by transbrochial bx  . Allergies Mother   . Heart disease Father   . Diabetes Father   . Allergies Father   . Coronary artery disease Father   . Cancer Maternal Grandmother        CA of unknown type  . Cancer Paternal Grandmother        CA of unknown type    Review of Systems  Constitutional: Negative for chills and fever.  Respiratory: Positive for cough (intermittent), shortness of breath (occasional since PNA) and wheezing.   Cardiovascular: Negative for chest pain, palpitations and leg swelling.  Neurological: Negative for light-headedness and headaches.       Objective:   Vitals:   05/09/17 0847  BP: (!) 144/92  Pulse: 92  Resp: 16  Temp: 98 F (36.7 C)   Wt Readings from Last 3 Encounters:  05/09/17 275 lb (124.7 kg)  05/01/17 277 lb (125.6 kg)  01/30/17 275 lb (124.7 kg)   Body mass index is 53.71 kg/m.   Physical Exam    Constitutional: Appears well-developed and well-nourished. No distress.  HENT:  Head: Normocephalic and atraumatic.  Neck: Neck supple. No tracheal deviation present. No thyromegaly present.  No cervical lymphadenopathy Cardiovascular: Normal rate,  regular rhythm and normal heart sounds.   No murmur heard. No carotid bruit .  No edema Pulmonary/Chest: Effort normal and breath sounds normal. No respiratory distress. No has no wheezes. No rales.  Skin: Skin is warm and dry. Not diaphoretic.  Psychiatric: Normal mood and affect. Behavior is normal.   Diabetic Foot Exam - Simple   Simple Foot Form Diabetic Foot exam was performed with the following findings:  Yes 05/09/2017  9:13 AM  Visual Inspection No deformities, no ulcerations, no other skin breakdown bilaterally:  Yes Sensation Testing Intact to touch and monofilament testing bilaterally:  Yes Pulse Check Posterior Tibialis and Dorsalis pulse intact bilaterally:  Yes Comments       Assessment & Plan:    See Problem List for Assessment and Plan of chronic medical problems.

## 2017-05-09 ENCOUNTER — Encounter: Payer: Self-pay | Admitting: Internal Medicine

## 2017-05-09 ENCOUNTER — Ambulatory Visit (INDEPENDENT_AMBULATORY_CARE_PROVIDER_SITE_OTHER): Payer: Medicare Other | Admitting: Internal Medicine

## 2017-05-09 ENCOUNTER — Other Ambulatory Visit (INDEPENDENT_AMBULATORY_CARE_PROVIDER_SITE_OTHER): Payer: Medicare Other

## 2017-05-09 VITALS — BP 144/92 | HR 92 | Temp 98.0°F | Resp 16 | Wt 275.0 lb

## 2017-05-09 DIAGNOSIS — E1165 Type 2 diabetes mellitus with hyperglycemia: Secondary | ICD-10-CM

## 2017-05-09 DIAGNOSIS — I10 Essential (primary) hypertension: Secondary | ICD-10-CM

## 2017-05-09 DIAGNOSIS — E785 Hyperlipidemia, unspecified: Secondary | ICD-10-CM | POA: Diagnosis not present

## 2017-05-09 LAB — LIPID PANEL
CHOL/HDL RATIO: 3
Cholesterol: 141 mg/dL (ref 0–200)
HDL: 42.9 mg/dL (ref >39.00)
LDL Cholesterol: 80 mg/dL (ref 0–99)
NONHDL: 98.23
Triglycerides: 91 mg/dL (ref 0.0–149.0)
VLDL: 18.2 mg/dL (ref 0.0–40.0)

## 2017-05-09 LAB — TSH: TSH: 1.25 u[IU]/mL (ref 0.35–4.50)

## 2017-05-09 LAB — COMPREHENSIVE METABOLIC PANEL
ALK PHOS: 78 U/L (ref 39–117)
ALT: 12 U/L (ref 0–35)
AST: 11 U/L (ref 0–37)
Albumin: 3.7 g/dL (ref 3.5–5.2)
BUN: 12 mg/dL (ref 6–23)
CO2: 31 meq/L (ref 19–32)
Calcium: 9.1 mg/dL (ref 8.4–10.5)
Chloride: 101 mEq/L (ref 96–112)
Creatinine, Ser: 0.74 mg/dL (ref 0.40–1.20)
GFR: 109.09 mL/min (ref >60.00)
GLUCOSE: 227 mg/dL — AB (ref 70–99)
POTASSIUM: 4 meq/L (ref 3.5–5.1)
Sodium: 138 mEq/L (ref 135–145)
Total Bilirubin: 0.3 mg/dL (ref 0.2–1.2)
Total Protein: 6.9 g/dL (ref 6.0–8.3)

## 2017-05-09 LAB — HEMOGLOBIN A1C: HEMOGLOBIN A1C: 10.1 % — AB (ref 4.6–6.5)

## 2017-05-09 MED ORDER — DULAGLUTIDE 0.75 MG/0.5ML ~~LOC~~ SOAJ: 0.7500 mg | SUBCUTANEOUS | 5 refills | Status: DC

## 2017-05-09 MED ORDER — LOSARTAN POTASSIUM 25 MG PO TABS: 12.5000 mg | ORAL_TABLET | Freq: Every day | ORAL | 3 refills | Status: DC

## 2017-05-09 NOTE — Assessment & Plan Note (Addendum)
Sugars not ideally controlled Stop taking the insulin because she does not want to keep sticking herself on a daily basis Once to work on weight loss When she is able she will start exercising regularly Stressed compliance with a diabetic diet Continue his Janumet Trial of trulicity weekly-we will increase if tolerated  follow-up in 3 months

## 2017-05-09 NOTE — Assessment & Plan Note (Signed)
Slightly elevated here and has been elevated frequently Will add losartan 12.5 mg daily and increase if needed-she is worried about renal protection and this will help that as well Check CMP

## 2017-05-09 NOTE — Assessment & Plan Note (Signed)
Check lipid panel even though she is not fasting Continue statin Work on weight loss Work on increasing exercise

## 2017-05-09 NOTE — Patient Instructions (Addendum)
Test(s) ordered today. Your results will be released to [place] (or called to you) after review, usually within 72hours after test completion. If any changes need to be made, you will be notified at that same time.   Medications reviewed and updated.  Changes include starting trulicity for your sugars and starting losartan 12.5 mg daily for your blood pressure.    Your prescription(s) have been submitted to your pharmacy. Please take as directed and contact our office if you believe you are having problem(s) with the medication(s).   Please followup in 3 months   Dulaglutide injection (Trulicity) What is this medicine? DULAGLUTIDE (DOO la GLOO tide) is used to improve blood sugar control in adults with type 2 diabetes. This medicine may be used with other oral diabetes medicines. This medicine may be used for other purposes; ask your health care provider or pharmacist if you have questions. COMMON BRAND NAME(S): TRULICITY What should I tell my health care provider before I take this medicine? They need to know if you have any of these conditions: -endocrine tumors (MEN 2) or if someone in your family had these tumors -history of pancreatitis -kidney disease -liver disease -stomach problems -thyroid cancer or if someone in your family had thyroid cancer -an unusual or allergic reaction to dulaglutide, other medicines, foods, dyes, or preservatives -pregnant or trying to get pregnant -breast-feeding How should I use this medicine? This medicine is for injection under the skin of your upper leg (thigh), stomach area, or upper arm. It is usually given once every week (every 7 days). You will be taught how to prepare and give this medicine. Use exactly as directed. Take your medicine at regular intervals. Do not take it more often than directed. If you use this medicine with insulin, you should inject this medicine and the insulin separately. Do not mix them together. Do not give the  injections right next to each other. Change (rotate) injection sites with each injection. It is important that you put your used needles and syringes in a special sharps container. Do not put them in a trash can. If you do not have a sharps container, call your pharmacist or healthcare provider to get one. A special MedGuide will be given to you by the pharmacist with each prescription and refill. Be sure to read this information carefully each time. Talk to your pediatrician regarding the use of this medicine in children. Special care may be needed. Overdosage: If you think you have taken too much of this medicine contact a poison control center or emergency room at once. NOTE: This medicine is only for you. Do not share this medicine with others. What if I miss a dose? If you miss a dose, take it as soon as you can within 3 days after the missed dose. Then take your next dose at your regular weekly time. If it has been longer than 3 days after the missed dose, do not take the missed dose. Take the next dose at your regular time. Do not take double or extra doses. If you have questions about a missed dose, contact your health care provider for advice. What may interact with this medicine? -other medicines for diabetes Many medications may cause changes in blood sugar, these include: -alcohol containing beverages -antiviral medicines for HIV or AIDS -aspirin and aspirin-like drugs -certain medicines for blood pressure, heart disease, irregular heart beat -chromium -diuretics -female hormones, such as estrogens or progestins, birth control pills -fenofibrate -gemfibrozil -isoniazid -lanreotide -female hormones or  anabolic steroids -MAOIs like Carbex, Eldepryl, Marplan, Nardil, and Parnate -medicines for weight loss -medicines for allergies, asthma, cold, or cough -medicines for depression, anxiety, or psychotic disturbances -niacin -nicotine -NSAIDs, medicines for pain and inflammation,  like ibuprofen or naproxen -octreotide -pasireotide -pentamidine -phenytoin -probenecid -quinolone antibiotics such as ciprofloxacin, levofloxacin, ofloxacin -some herbal dietary supplements -steroid medicines such as prednisone or cortisone -sulfamethoxazole; trimethoprim -thyroid hormones Some medications can hide the warning symptoms of low blood sugar (hypoglycemia). You may need to monitor your blood sugar more closely if you are taking one of these medications. These include: -beta-blockers, often used for high blood pressure or heart problems (examples include atenolol, metoprolol, propranolol) -clonidine -guanethidine -reserpine This list may not describe all possible interactions. Give your health care provider a list of all the medicines, herbs, non-prescription drugs, or dietary supplements you use. Also tell them if you smoke, drink alcohol, or use illegal drugs. Some items may interact with your medicine. What should I watch for while using this medicine? Visit your doctor or health care professional for regular checks on your progress. Drink plenty of fluids while taking this medicine. Check with your doctor or health care professional if you get an attack of severe diarrhea, nausea, and vomiting. The loss of too much body fluid can make it dangerous for you to take this medicine. A test called the HbA1C (A1C) will be monitored. This is a simple blood test. It measures your blood sugar control over the last 2 to 3 months. You will receive this test every 3 to 6 months. Learn how to check your blood sugar. Learn the symptoms of low and high blood sugar and how to manage them. Always carry a quick-source of sugar with you in case you have symptoms of low blood sugar. Examples include hard sugar candy or glucose tablets. Make sure others know that you can choke if you eat or drink when you develop serious symptoms of low blood sugar, such as seizures or unconsciousness. They must get  medical help at once. Tell your doctor or health care professional if you have high blood sugar. You might need to change the dose of your medicine. If you are sick or exercising more than usual, you might need to change the dose of your medicine. Do not skip meals. Ask your doctor or health care professional if you should avoid alcohol. Many nonprescription cough and cold products contain sugar or alcohol. These can affect blood sugar. Pens should never be shared. Even if the needle is changed, sharing may result in passing of viruses like hepatitis or HIV. Wear a medical ID bracelet or chain, and carry a card that describes your disease and details of your medicine and dosage times. What side effects may I notice from receiving this medicine? Side effects that you should report to your doctor or health care professional as soon as possible: -allergic reactions like skin rash, itching or hives, swelling of the face, lips, or tongue -breathing problems -diarrhea that continues or is severe -lump or swelling on the neck -severe nausea -signs and symptoms of infection like fever or chills; cough; sore throat; pain or trouble passing urine -signs and symptoms of low blood sugar such as feeling anxious, confusion, dizziness, increased hunger, unusually weak or tired, sweating, shakiness, cold, irritable, headache, blurred vision, fast heartbeat, loss of consciousness -signs and symptoms of kidney injury like trouble passing urine or change in the amount of urine -trouble swallowing -unusual stomach upset or pain -vomiting Side effects  that usually do not require medical attention (report to your doctor or health care professional if they continue or are bothersome): -diarrhea -loss of appetite -nausea -pain, redness, or irritation at site where injected -stomach upset This list may not describe all possible side effects. Call your doctor for medical advice about side effects. You may report side  effects to FDA at 1-800-FDA-1088. Where should I keep my medicine? Keep out of the reach of children. Store unopened pens in a refrigerator between 2 and 8 degrees C (36 and 46 degrees F). Do not freeze or use if the medicine has been frozen. Protect from light and excessive heat. Store in the carton until use. Each single-dose pen can be kept at room temperature, not to exceed 30 degrees C (86 degrees F) for a total of 14 days, if needed. Throw away any unused medicine after the expiration date on the label. NOTE: This sheet is a summary. It may not cover all possible information. If you have questions about this medicine, talk to your doctor, pharmacist, or health care provider.  2018 Elsevier/Gold Standard (2016-10-04 14:35:01)

## 2017-05-09 NOTE — Assessment & Plan Note (Signed)
She is interested in losing weight She plans on starting to exercise-walking. Encouraged her to start slowly and continue to increase as able Decreased portions Diabetic diet She has discontinued the insulin on her own and does not take it  Will start trulicity in hopes that that helps control her sugars and also eats and weight loss-we'll increase if she tolerates the lower dose Follow-up in 3 months) goal weight loss 15 pounds

## 2017-05-10 ENCOUNTER — Other Ambulatory Visit: Payer: Self-pay | Admitting: Internal Medicine

## 2017-05-10 ENCOUNTER — Encounter: Payer: Self-pay | Admitting: Internal Medicine

## 2017-05-31 ENCOUNTER — Encounter: Payer: Self-pay | Admitting: Internal Medicine

## 2017-05-31 MED ORDER — DULAGLUTIDE 1.5 MG/0.5ML ~~LOC~~ SOAJ: 1.5000 mg | SUBCUTANEOUS | 5 refills | Status: DC

## 2017-06-10 IMAGING — CR DG FOOT COMPLETE 3+V*R*
3 series · 3 of 3 positions shown · non-contrast
Comparison: None in PACs

CLINICAL DATA: Direct trauma to the great toe this morning after a
can fell upon it.

EXAM:
RIGHT FOOT COMPLETE - 3+ VIEW

[x foot ap right]
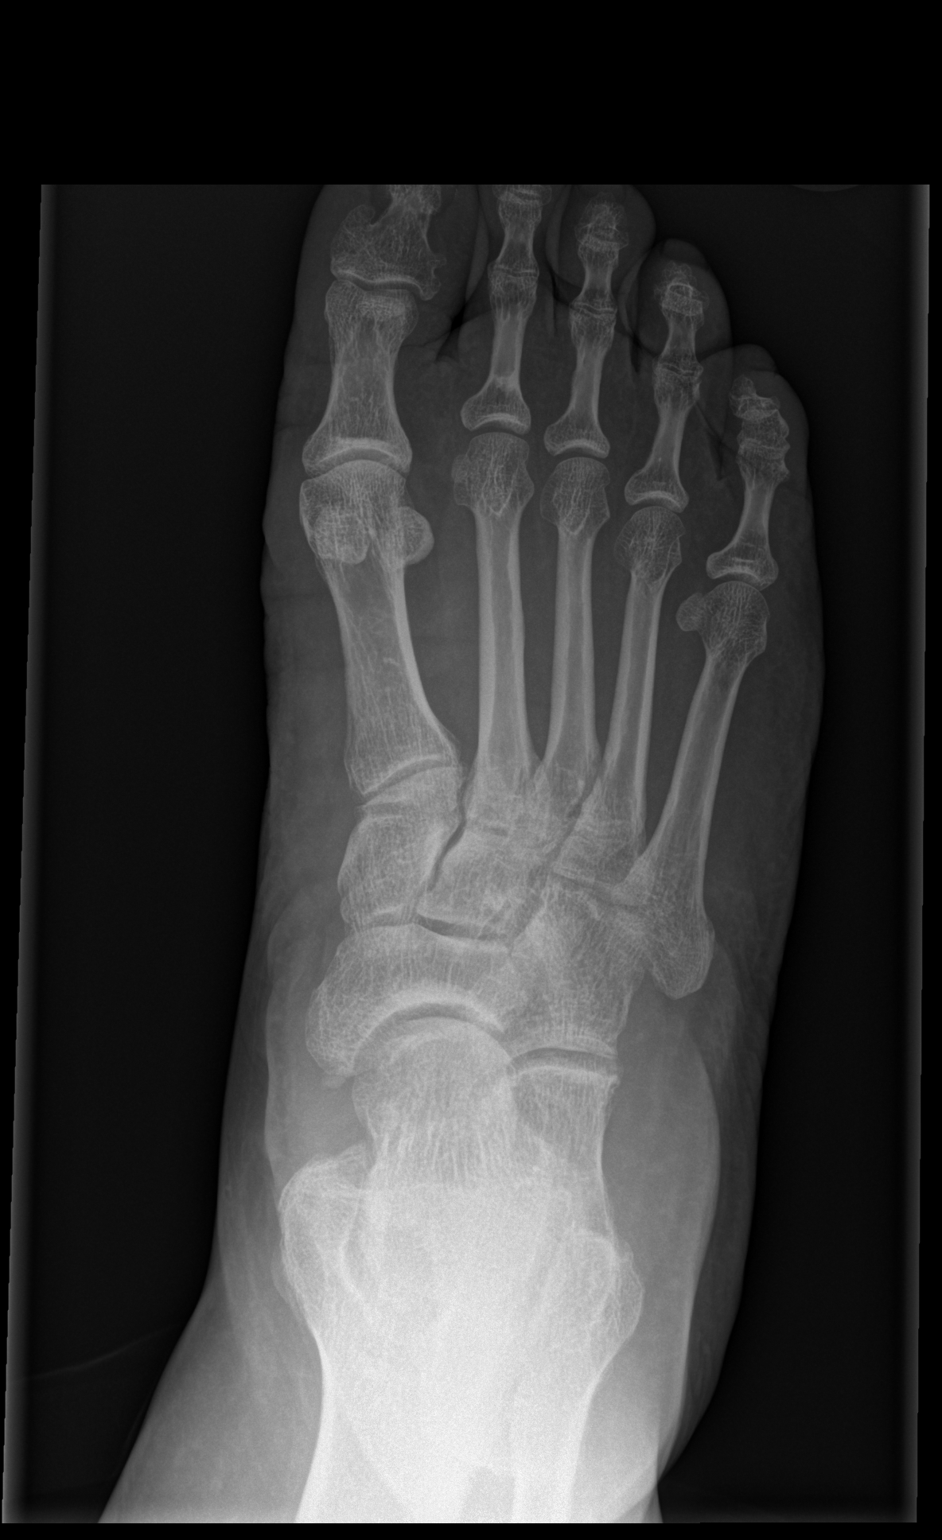

[x foot obl right]
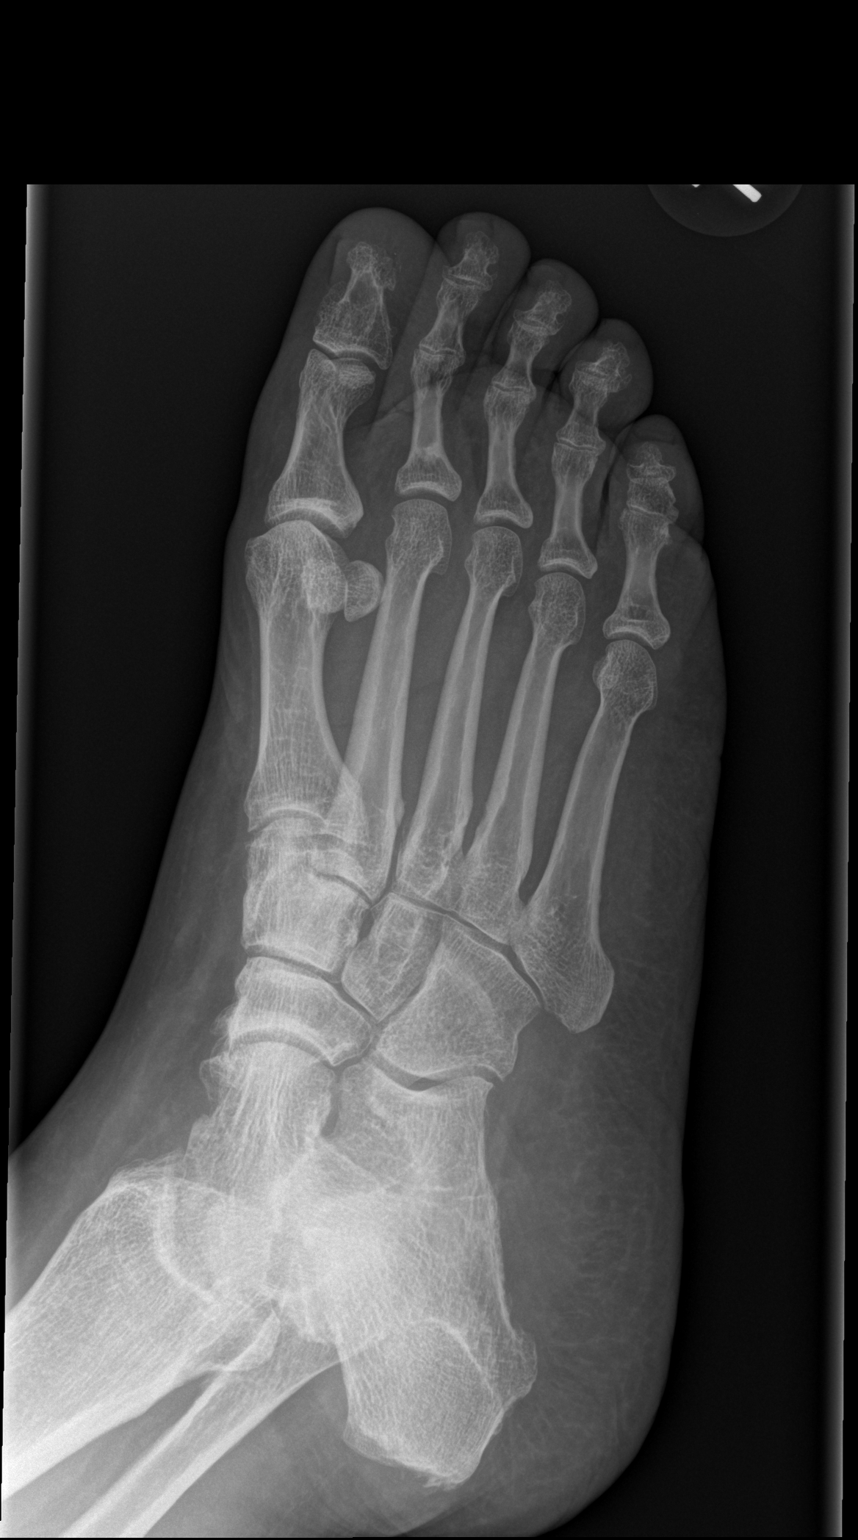

[x foot lat right]
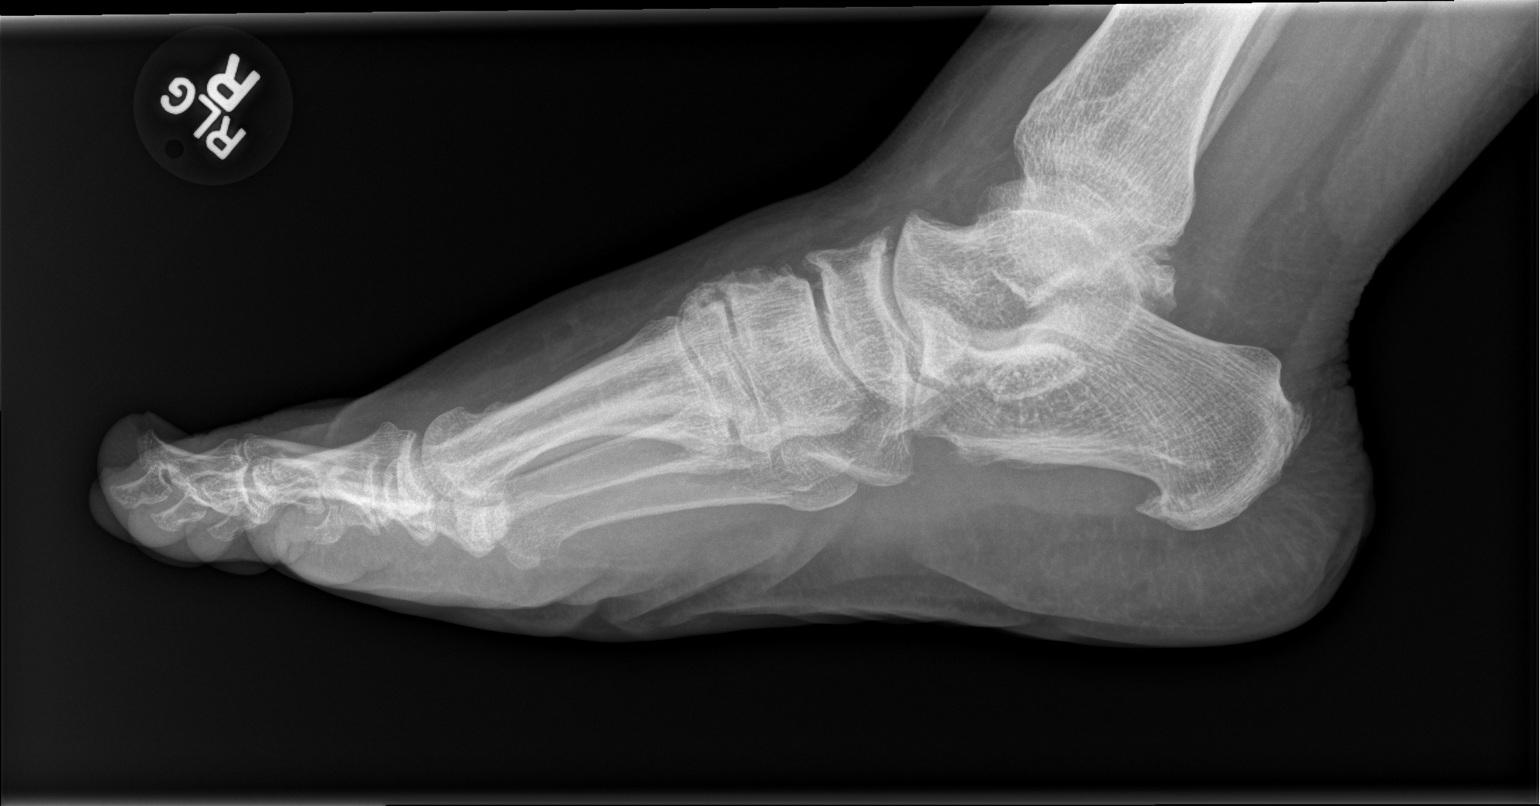

[3 of 3 positions shown; findings below may reference images not displayed]

FINDINGS: The bones is subjectively adequately mineralized. No acute fracture
or dislocation of the great toe is observed. The interphalangeal and
MTP joint of the great toe are normal. The other phalanges are
intact. The metatarsals are intact. The bones of the hindfoot
exhibit no acute abnormalities. There is a plantar and smaller
Achilles region calcaneal spur.
IMPRESSION: No acute fracture of the great toe is observed. There are mild
osteoarthritic changes especially in the midfoot and hindfoot.

## 2017-06-12 ENCOUNTER — Ambulatory Visit: Payer: BC Managed Care – PPO | Admitting: Internal Medicine

## 2017-06-14 ENCOUNTER — Ambulatory Visit: Payer: BC Managed Care – PPO | Admitting: Internal Medicine

## 2017-07-08 ENCOUNTER — Encounter (HOSPITAL_COMMUNITY): Payer: Self-pay | Admitting: Emergency Medicine

## 2017-07-08 ENCOUNTER — Ambulatory Visit (HOSPITAL_COMMUNITY)
Admission: EM | Admit: 2017-07-08 | Discharge: 2017-07-08 | Disposition: A | Discharge status: Home / Self Care | Payer: Medicare Other | Attending: Family Medicine | Admitting: Family Medicine

## 2017-07-08 DIAGNOSIS — J9801 Acute bronchospasm: Secondary | ICD-10-CM | POA: Diagnosis not present

## 2017-07-08 DIAGNOSIS — J069 Acute upper respiratory infection, unspecified: Secondary | ICD-10-CM | POA: Diagnosis not present

## 2017-07-08 DIAGNOSIS — J849 Interstitial pulmonary disease, unspecified: Secondary | ICD-10-CM

## 2017-07-08 MED ORDER — PREDNISONE 50 MG PO TABS: ORAL_TABLET | ORAL | 0 refills | Status: DC

## 2017-07-08 MED ORDER — TRIAMCINOLONE ACETONIDE 40 MG/ML IJ SUSP
40.0000 mg | Freq: Once | INTRAMUSCULAR | Status: AC
  Administered 2017-07-08: 40 mg via INTRAMUSCULAR

## 2017-07-08 MED ORDER — ALBUTEROL SULFATE HFA 108 (90 BASE) MCG/ACT IN AERS: 2.0000 | INHALATION_SPRAY | RESPIRATORY_TRACT | 0 refills | Status: DC | PRN

## 2017-07-08 MED ORDER — IPRATROPIUM-ALBUTEROL 0.5-2.5 (3) MG/3ML IN SOLN
RESPIRATORY_TRACT | Status: AC
  Filled 2017-07-08: qty 3

## 2017-07-08 MED ORDER — IPRATROPIUM-ALBUTEROL 0.5-2.5 (3) MG/3ML IN SOLN
3.0000 mL | Freq: Once | RESPIRATORY_TRACT | Status: AC
  Administered 2017-07-08: 3 mL via RESPIRATORY_TRACT

## 2017-07-08 MED ORDER — TRIAMCINOLONE ACETONIDE 40 MG/ML IJ SUSP
INTRAMUSCULAR | Status: AC
  Filled 2017-07-08: qty 1

## 2017-07-08 NOTE — ED Provider Notes (Addendum)
[place]    CSN: [id number] Arrival date & time: 07/08/17  1022     History   Chief Complaint Chief Complaint  Patient presents with  . URI    HPI [name] is a 46 y.o. female.   46 year old obese female complaining of cough, wheeze, nasal congestion, tightness in the chest for the past 4 days. She uses long-acting beta agonist at home but no rescue inhalers. She has a history of interstitial lung disease, nonspecified.      Past Medical History:  Diagnosis Date  . Allergic rhinitis   . Bronchitis   . Chronic rhinitis   . Cough   . Dyslipidemia   . Dysphagia   . GERD (gastroesophageal reflux disease)   . Headache(784.0)   . ILD (interstitial lung disease) ([place])   . Mild depression ([place])   . Morbid obesity ([place])   . Type II or diabetes mellitus, uncontrolled 11/2012 dx   Dx 12/08/12: a1c 10.0  . Unspecified essential hypertension   . Urge incontinence     Patient Active Problem List   Diagnosis Date Noted  . Influenza 11/06/2016  . Knee pain, left 02/02/2016  . Cough variant asthma 03/09/2015  . Diabetes type 2, uncontrolled ([place]) 12/09/2012  . Dyslipidemia   . Acute on chronic respiratory failure with hypoxia ([place]) 04/05/2012  . CONJUNCTIVITIS, ALLERGIC, SEASONAL 01/05/2010  . Allergic rhinitis 01/05/2010  . Essential hypertension 09/07/2009  . MUSCLE SPASM 09/07/2009  . DEPRESSION, MILD 03/24/2009  . HEADACHE 03/24/2009  . URINARY INCONTINENCE, URGE 03/24/2009  . CHRONIC RHINITIS 12/23/2008  . Severe obesity (BMI >= 40) ([place]) 10/10/2007  . INTERSTITIAL LUNG DISEASE 10/10/2007  . GASTROESOPHAGEAL REFLUX DISEASE 10/10/2007    Past Surgical History:  Procedure Laterality Date  . ABDOMINAL HYSTERECTOMY    . BILATERAL VATS ABLATION  02/15/04  . CESAREAN SECTION    . LUNG BIOPSY    . RIGHT HEART CATH N/A 01/30/2017   Procedure: Right Heart Cath;  Surgeon: Larey Dresser, MD;  Location: [facility];  Service: Cardiovascular;   Laterality: N/A;    OB History    No data available       Home Medications    Prior to Admission medications   Medication Sig Start Date End Date Taking? Authorizing Provider  atorvastatin (LIPITOR) 20 MG tablet TAKE ONE TABLET BY MOUTH DAILY 05/10/17  Yes Burns, Claudina Lick, MD  B COMPLEX VITAMINS SL Place 1 mL under the tongue 4 (four) times a week.   Yes [provider]  Cholecalciferol (VITAMIN D3) 5000 units CAPS Take 5,000 Units by mouth daily.   Yes [provider]  diphenhydrAMINE (BENADRYL) 25 MG tablet Take 25-50 mg by mouth at bedtime as needed for allergies.   Yes [provider]  Dulaglutide (TRULICITY) 1.5 QI/2.9NL SOPN Inject 1.5 mg into the skin once a week. 05/31/17  Yes Burns, Claudina Lick, MD  ferrous sulfate 325 (65 FE) MG EC tablet Take 325 mg by mouth daily.   Yes [provider]  fluticasone (FLONASE) 50 MCG/ACT nasal spray Place 2 sprays into both nostrils daily as needed for allergies or rhinitis.   Yes [provider]  loratadine (CLARITIN) 10 MG tablet Take 10 mg by mouth daily.    Yes [provider]  losartan (COZAAR) 25 MG tablet Take 0.5 tablets (12.5 mg total) by mouth daily. 05/09/17  Yes Burns, Claudina Lick, MD  mometasone-formoterol (DULERA) 100-5 MCG/ACT AERO Take 2 puffs first  thing in am and then another 2 puffs about 12 hours later. 03/09/15  Yes Tanda Rockers, MD  montelukast (SINGULAIR) 10 MG tablet Take 1 tablet (10 mg total) by mouth at bedtime. 02/02/16  Yes Burns, Claudina Lick, MD  pantoprazole (PROTONIX) 40 MG tablet Take 1 tablet (40 mg total) by mouth 2 (two) times daily before a meal. 08/31/16  Yes Tanda Rockers, MD  SitaGLIPtin-MetFORMIN HCl (JANUMET XR) [phone] MG TB24 Take 1 tablet by mouth daily. 12/12/16  Yes Burns, Claudina Lick, MD  Tetrahydrozoline-Zn Sulfate (VISINE-AC OP) Apply 1 drop to eye daily as needed (allergies).   Yes [provider]  Turmeric 500 MG CAPS Take 500 mg by mouth daily.   Yes  [provider]  albuterol (PROVENTIL HFA;VENTOLIN HFA) 108 (90 Base) MCG/ACT inhaler Inhale 2 puffs into the lungs every 4 (four) hours as needed for wheezing or shortness of breath. 07/08/17   Janne Napoleon, NP  aspirin 81 MG chewable tablet Chew 81 mg by mouth daily.    [provider]  aspirin-acetaminophen-caffeine (EXCEDRIN MIGRAINE) [phone] MG tablet Take 2 tablets by mouth daily as needed for migraine.    [provider]  predniSONE (DELTASONE) 5 MG tablet Take 5 mg by mouth daily with breakfast. Take with three 1mg  tablets to total 8mg s every morning    [provider]  predniSONE (DELTASONE) 50 MG tablet 1 tab po daily for 6 days. Take with food. 07/08/17   Janne Napoleon, NP    Family History Family History  Problem Relation Age of Onset  . Sarcoidosis Mother        dxed by transbrochial bx  . Allergies Mother   . Heart disease Father   . Diabetes Father   . Allergies Father   . Coronary artery disease Father   . Cancer Maternal Grandmother        CA of unknown type  . Cancer Paternal Grandmother        CA of unknown type    Social History Social History  Substance Use Topics  . Smoking status: Never Smoker  . Smokeless tobacco: Never Used     Comment: {  . Alcohol use No     Allergies   Penicillins; Peach flavor; and Levaquin [levofloxacin]   Review of Systems Review of Systems  Constitutional: Negative.  Negative for activity change, appetite change, chills, fatigue and fever.  HENT: Positive for congestion, postnasal drip and rhinorrhea. Negative for facial swelling, sore throat and trouble swallowing.   Eyes: Negative.   Respiratory: Positive for cough, shortness of breath and wheezing.   Cardiovascular: Negative.   Genitourinary: Negative.   Musculoskeletal: Negative for neck pain and neck stiffness.  Skin: Negative for pallor and rash.  Neurological: Negative.   All other systems reviewed and are  negative.    Physical Exam Triage Vital Signs ED Triage Vitals  Enc Vitals Group     BP 07/08/17 1139 (!) 150/100     Pulse Rate 07/08/17 1139 (!) 101     Resp 07/08/17 1139 (!) 24     Temp 07/08/17 1139 98.7 F (37.1 C)     Temp Source 07/08/17 1139 Oral     SpO2 07/08/17 1139 96 %     Weight --      Height --      Head Circumference --      Peak Flow --      Pain Score 07/08/17 1138 4     Pain  Loc --      Pain Edu? --      Excl. in [place]? --    No data found.   Updated Vital Signs BP (!) 150/100 (BP Location: Right Wrist)   Pulse 87   Temp 98.7 F (37.1 C) (Oral)   Resp 20   SpO2 100%   Visual Acuity Right Eye Distance:   Left Eye Distance:   Bilateral Distance:    Right Eye Near:   Left Eye Near:    Bilateral Near:     Physical Exam  Constitutional: She is oriented to person, place, and time. She appears well-developed and well-nourished. No distress.  HENT:  Head: Normocephalic and atraumatic.  Mouth/Throat: No oropharyngeal exudate.  Oropharynx with minor erythema and thick clear to gray PND.  Eyes: EOM are normal.  Neck: Normal range of motion. Neck supple.  Cardiovascular: Normal rate, regular rhythm, normal heart sounds and intact distal pulses.   Pulmonary/Chest: Effort normal. No respiratory distress. She has no rales.  Air movement decreased laterally. Mildly prolonged expiratory phase. Coughing with attempts to take a deep breath. No overt wheezing.  Musculoskeletal: Normal range of motion.  Lymphadenopathy:    She has no cervical adenopathy.  Neurological: She is alert and oriented to person, place, and time.  Skin: Skin is warm and dry.  Psychiatric: She has a normal mood and affect.  Nursing note and vitals reviewed.    UC Treatments / Results  Labs (all labs ordered are listed, but only abnormal results are displayed) Labs Reviewed - No data to display  EKG  EKG Interpretation None       Radiology No results  found.  Procedures Procedures (including critical care time)  Medications Ordered in UC Medications  ipratropium-albuterol (DUONEB) 0.5-2.5 (3) MG/3ML nebulizer solution 3 mL (3 mLs Nebulization Given 07/08/17 1158)  triamcinolone acetonide (KENALOG-40) injection 40 mg (40 mg Intramuscular Given 07/08/17 1216)     Initial Impression / Assessment and Plan / UC Course  I have reviewed the triage vital signs and the nursing notes.  Pertinent labs & imaging results that were available during my care of the patient were reviewed by me and considered in my medical decision making (see chart for details).    Start using the albuterol HFA 2 puffs every 4 hours for cough and wheeze. Continue using your other inhalers. Start taking the prednisone tomorrow. Take with food. For drainage recommend taking Allegra or Zyrtec daily for the next few days. For nasal congestion Sudafed PE 10 mg every 4-6 hours. He can use lots of saline nasal spray frequently.  Post DuoNeb patient is breathing better, less cough. Able to take a deep breath without coughing. Improved air movement. With cough there is some distant wheeze otherwise clear. Oxygen saturation 100%.  Final Clinical Impressions(s) / UC Diagnoses   Final diagnoses:  Viral upper respiratory tract infection  Bronchospasm  ILD (interstitial lung disease) (HCC)    New Prescriptions New Prescriptions   ALBUTEROL (PROVENTIL HFA;VENTOLIN HFA) 108 (90 BASE) MCG/ACT INHALER    Inhale 2 puffs into the lungs every 4 (four) hours as needed for wheezing or shortness of breath.   PREDNISONE (DELTASONE) 50 MG TABLET    1 tab po daily for 6 days. Take with food.     Controlled Substance Prescriptions [place] Controlled Substance Registry consulted? Not Applicable   Janne Napoleon, NP 07/08/17 [place], Axiel Fjeld, NP 07/08/17 1235

## 2017-07-08 NOTE — Discharge Instructions (Signed)
Start using the albuterol HFA 2 puffs every 4 hours for cough and wheeze. Continue using your other inhalers. Start taking the prednisone tomorrow. Take with food. For drainage recommend taking Allegra or Zyrtec daily for the next few days. For nasal congestion Sudafed PE 10 mg every 4-6 hours. He can use lots of saline nasal spray frequently.

## 2017-07-08 NOTE — ED Triage Notes (Signed)
Pt c/o cold sx onset 5 days associated w/prod cough, wheezing, SOB, nasal congestion  Hx of interstitial lung disease  A&O x4... NAD... Ambulatory

## 2017-07-10 ENCOUNTER — Other Ambulatory Visit: Payer: Self-pay | Admitting: Internal Medicine

## 2017-07-10 DIAGNOSIS — R768 Other specified abnormal immunological findings in serum: Secondary | ICD-10-CM | POA: Diagnosis not present

## 2017-07-10 DIAGNOSIS — M2550 Pain in unspecified joint: Secondary | ICD-10-CM | POA: Diagnosis not present

## 2017-07-10 DIAGNOSIS — R05 Cough: Secondary | ICD-10-CM | POA: Diagnosis not present

## 2017-07-10 DIAGNOSIS — J849 Interstitial pulmonary disease, unspecified: Secondary | ICD-10-CM | POA: Diagnosis not present

## 2017-07-10 DIAGNOSIS — Z6841 Body Mass Index (BMI) 40.0 and over, adult: Secondary | ICD-10-CM | POA: Diagnosis not present

## 2017-07-10 DIAGNOSIS — M25511 Pain in right shoulder: Secondary | ICD-10-CM | POA: Diagnosis not present

## 2017-07-10 DIAGNOSIS — M791 Myalgia, unspecified site: Secondary | ICD-10-CM | POA: Diagnosis not present

## 2017-07-10 DIAGNOSIS — I288 Other diseases of pulmonary vessels: Secondary | ICD-10-CM | POA: Diagnosis not present

## 2017-07-10 DIAGNOSIS — H0520 Unspecified exophthalmos: Secondary | ICD-10-CM | POA: Diagnosis not present

## 2017-07-12 ENCOUNTER — Encounter: Payer: Self-pay | Admitting: Internal Medicine

## 2017-07-12 ENCOUNTER — Ambulatory Visit (INDEPENDENT_AMBULATORY_CARE_PROVIDER_SITE_OTHER): Payer: Medicare Other | Admitting: Internal Medicine

## 2017-07-12 VITALS — BP 134/84 | HR 85 | Ht 60.0 in | Wt 270.0 lb

## 2017-07-12 DIAGNOSIS — J45991 Cough variant asthma: Secondary | ICD-10-CM

## 2017-07-12 DIAGNOSIS — J9611 Chronic respiratory failure with hypoxia: Secondary | ICD-10-CM | POA: Diagnosis not present

## 2017-07-12 DIAGNOSIS — J8410 Pulmonary fibrosis, unspecified: Secondary | ICD-10-CM | POA: Diagnosis not present

## 2017-07-12 MED ORDER — AZITHROMYCIN 250 MG PO TABS: ORAL_TABLET | ORAL | 0 refills | Status: DC

## 2017-07-12 MED ORDER — ACETAMINOPHEN-CODEINE #3 300-30 MG PO TABS: ORAL_TABLET | ORAL | 0 refills | Status: DC

## 2017-07-12 MED ORDER — PREDNISONE 10 MG PO TABS: ORAL_TABLET | ORAL | 0 refills | Status: DC

## 2017-07-12 MED ORDER — BUDESONIDE-FORMOTEROL FUMARATE 80-4.5 MCG/ACT IN AERO: 2.0000 | INHALATION_SPRAY | Freq: Two times a day (BID) | RESPIRATORY_TRACT | 0 refills | Status: DC

## 2017-07-12 MED ORDER — FLUTTER DEVI: 0 refills | Status: DC

## 2017-07-12 NOTE — Progress Notes (Signed)
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  Since then waxing/waning sx requiring intermittent prednisone tapers with only minimal improvement - most of her symptoms chronically have been related to obesity with dyspnea on exertion and tendency to chronic cough which is felt to be at least partly  reflux related but improved with saba 03/09/2015 so started on dulera 100 2bid in addition to maint pred and seems to have helped but not typically  able to get < 10 mg pred s flare cough/sob     History of Present Illness  November 07, 2010 ov cc cough/ strangling  a bit more with taper off reglan (actually worse before ran out of the low dose reglan)  no excess mucus or increase sob on prednisone @  10 mg daily .  Using med calendar well rec 1)  Ok to resume water aerobics but pace yourself 2)  Wait until your swallowing evaulation is complete before making a decision re reglan > stopped it on her own.  3)  Try Prednisone 10 mg one-half each am   04/04/2012 f/u ov/[name] no longer using med calendar on Pred 10 mg one daily  added hyzaar to rx for hbp/leg swelling not back to mall walking yet,  No unusual cough, purulent sputum or sinus/hb symptoms on present rx. No variability to symptoms or need for inhaler. Rec Try prednisone 10 mg one half daily to see if affects your ability to walk the mall on 02 or the cough gets worse (walk the mall first for at least before you try) Always walk for exercise on 3lpm - this will help you burn fat and get into negative calorie balance to lose weight.   03/17/2013 f/u ov/[name] re NSIP on floor of 10 mg per day Chief Complaint  Patient presents with  . Follow-up    Breathing is overall doing well and she denies any new co's today.   ex on a treadmill x 66m x 3 x daily on 2lpm not the 3lpm as rec >>Prednisone  ceiling is 20 mg per day and floor is 10 mg per day        10/06/2014 f/u ov/[name] re: pred 10 mg daily after trying 10/5 until Tgiving and worse cough/ sob  Chief Complaint  Patient presents with  . Follow-up    F/U ILD; breathing doing fine; no complaints  Not doing treadmill  Any more but Not limited by breathing from desired activities   rec Resume treadmill x 30 min daily minimum If  doing great try to reduce prednisone to 10 mg alternating with 5 mg daily as your new floor but increase back to ceiling of 20 mg per day if needed     03/09/2015 f/u ov/[name] re: NSIP  pred 10 mg daily could not tol 10/5  Chief Complaint  Patient presents with  . Follow-up    PFT done today. Pt states her breathing is overall doing well, unless she gets exposed to hot/humid weather. She is taking 10 mg pred daily.   2lpm sleep/ 3 lpm ex , flare of cough with < 10 mg per day pred, better cough p albuterol  rec Work on inhaler technique:    Try dulera 100 Take 2 puffs first thing in am and then another  2 puffs about 12 hours later> helped some        02/06/2016  f/u ov/[name] re: NSIP / 3lpm with ex/ duler 100 2bid and pred 10 mg daily  Chief Complaint  Patient presents with  . Follow-up    Pt states that she did have some issues with allergies but is improving. Pt also was having issues with Dulera, jittery after use, but has started rinsing her mouth after use and side effects have improved. Pt denies cough/wheeze/SOB/CP/tightness.   limited by L knee from doing treadmill, has trainer but not losing wt  Rhinitis symptoms better on singulair   rec Work on inhaler technique:    Should be able to tolerate dulera 100 Take 2 puffs first thing in am and then another 2 puffs about 12 hours later.  If so, try prednisone 10 - 5  = even days take a half, odd days take half   06/01/2016  f/u ov/[name] re: NSIP  Chief Complaint  Patient presents with  . Follow-up    Breathing is doing well. She denies any new  co's today.   when ex 3lpm and back on treadmill x 25 min more limited by knee 2-3x per week at 96mph flat / minimal dry daytime cough on nexium 20 mg bid ac  rec Work on inhaler technique:   Continue  dulera 100 Take 2 puffs first thing in am and then another 2 puffs about 12 hours later.  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 Follow-up    Increased cough x 2 wks  on treadmill x 3lpm x 25 min  Decreased pred Nov 13th and cough worse starting on 23rd  Cough worse at hs and early in am but non-productive  Nose feels dried out/ clogged up/ saline helps  rec Stay on singulair each evening Try dulera 200 Take 2 puffs first thing in am and then another 2 puffs about 12 hours later to see if helps or not by the time you return  Protonix 40 mg Take 30- 60 min before your first and last meals of the day  Prednisone 10 mg daily until better then 10 alternating with 5 mg daily      NP ov 10/05/16 rec Follow med calendar closely and bring to each visit  Return to Prednisone 10mg  daily  Labs today .  Set up for HRCT Chest    07/12/2017 acute extended ov/[name] re: cough and "wheeze'  Chief Complaint  Patient presents with  . Acute Visit    went to UC on 07/08/17 with cough and wheezing. She states dxed with URI. She is still coughing with minimal green sputum and wheezing some.   prednisone from 10 mg to 5 mg over months and on 5mg  all summer ok  On dulera 100 Take 2 puffs first thing in am and then another 2 puffs about 12 hours later but inhaler is on zero,  Not using rescue correctly and did not bring it or med calendar as req   Acutely worse 07/04/17 with cough/ wheeze/ nasal congestion and reqported to UC she did not use saba rec 07/08/17 UC: Start using the albuterol HFA 2 puffs every 4 hours for cough and wheeze. Continue  using your other inhalers. Start taking the prednisone 50  mg x 6 days  Take with food. For drainage recommend taking Allegra or Zyrtec daily for the next few days. For nasal congestion Sudafed PE 10 mg every 4-6 hours.    Def better vs UC ov but still slt discolored am mucus    No obvious patterns in day to day or daytime variability or assoc  mucus plugs or hemoptysis or cp or chest tightness, subjective wheeze or overt sinus or hb symptoms. No unusual exp hx or h/o childhood pna/ asthma or knowledge of premature birth.  Sleeping ok flat without nocturnal  or early am exacerbation  of respiratory  c/o's or need for noct saba. Also denies any obvious fluctuation of symptoms with weather or environmental changes or other aggravating or alleviating factors except as outlined above   Current Allergies, Complete Past Medical History, Past Surgical History, Family History, and Social History were reviewed in [employer] record.  ROS  The following are not active complaints unless bolded Hoarseness, sore throat, dysphagia, dental problems, itching, sneezing,  nasal congestion or discharge of excess mucus or purulent secretions, ear ache,   fever, chills, sweats, unintended wt loss or wt gain, classically pleuritic or exertional cp,  orthopnea pnd or leg swelling, presyncope, palpitations, abdominal pain, anorexia, nausea, vomiting, diarrhea  or change in bowel habits or change in bladder habits, change in stools or change in urine, dysuria, hematuria,  rash, arthralgias, visual complaints, headache, numbness, weakness or ataxia or problems with walking or coordination,  change in mood/affect or memory.        Current Meds  Medication Sig  . albuterol (PROVENTIL HFA;VENTOLIN HFA) 108 (90 Base) MCG/ACT inhaler Inhale 2 puffs into the lungs every 4 (four) hours as needed for wheezing or shortness of breath.  Marland Kitchen aspirin 81 MG chewable tablet Chew 81 mg by mouth daily.  Marland Kitchen  aspirin-acetaminophen-caffeine (EXCEDRIN MIGRAINE) 250-250-65 MG tablet Take 2 tablets by mouth daily as needed for migraine.  Marland Kitchen atorvastatin (LIPITOR) 20 MG tablet TAKE ONE TABLET BY MOUTH DAILY  . B COMPLEX VITAMINS SL Place 1 mL under the tongue 4 (four) times a week.  . Cholecalciferol (VITAMIN D3) 5000 units CAPS Take 5,000 Units by mouth daily.  . diphenhydrAMINE (BENADRYL) 25 MG tablet Take 25-50 mg by mouth at bedtime as needed for allergies.  . Dulaglutide (TRULICITY) 1.5 DG/3.8VF SOPN Inject 1.5 mg into the skin once a week.  . ferrous sulfate 325 (65 FE) MG EC tablet Take 325 mg by mouth daily.  . fluticasone (FLONASE) 50 MCG/ACT nasal spray Place 2 sprays into both nostrils daily as needed for allergies or rhinitis.  Marland Kitchen guaiFENesin-codeine (ROBITUSSIN AC) 100-10 MG/5ML syrup Take 5 mLs by mouth 3 (three) times daily as needed for cough.  Marland Kitchen JANUMET XR [phone] MG TB24 TAKE 1 TABLET BY MOUTH DAILY.  Marland Kitchen loratadine (CLARITIN) 10 MG tablet Take 10 mg by mouth daily.   Marland Kitchen losartan (COZAAR) 25 MG tablet Take 0.5 tablets (12.5 mg total) by mouth daily.  . montelukast (SINGULAIR) 10 MG tablet Take 1 tablet (10 mg total) by mouth at bedtime.  . pantoprazole (PROTONIX) 40 MG tablet Take 1 tablet (40 mg total) by mouth 2 (two) times daily before a meal.  . predniSONE (DELTASONE) 5 MG tablet Take 5 mg by mouth daily with breakfast. Take with three 1mg  tablets to total 8mg s every morning  . Tetrahydrozoline-Zn Sulfate (VISINE-AC OP) Apply 1 drop to eye daily as needed (allergies).  . Turmeric 500 MG CAPS  Take 500 mg by mouth daily.                   Past Medical History: GASTROESOPHAGEAL REFLUX DISEASE     - Tapered reglan to 10 mg one half twice daily June 22, 2010 > d/c'd 11/2010  -restarted reglan 03/2011 >>unable to tolerate.  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)   -Restart Prednisone  02/24/08   -PFT's December 23, 2008 VC 35%  DLC0 31%   -Desats with > 2 laps 06/22/10 so rec wear 02 2lpm at bedtime and with ex  MORBID OBESITY   - Target wt  =  153  for BMI < 30  Hypertension Health Maintenance..........................................................Marland KitchenMarland KitchenAsa Lente    - Pneumovax July 22, 2009      - Flu 06/21/2011  Complex med regimen 06/21/2011 , 06/15/2013         Objective:   Physical Exam  Massively obese pleasant amb bf with severe coughing fits on arrival   Vital signs reviewed - note 100% on RA 06/01/2016     wt  305 May 28, 2008 >   302 November 07, 2010 >   303 03/22/2011 > 10/25/2011  299 > 311 04/04/2012 > 306 03/17/2013 >06/15/2013 303 > 10/06/2013 298  > 02/08/2014 289 > 10/06/2014   288 > 03/09/2015 283 > 10/21/2015    283 >  02/06/2016  284 > 06/01/2016  282 > 08/31/2016   281 > 07/12/2017   270   HEENT: nl dentition, turbinates, and oropharynx. Nl external ear canals without cough reflex Neck without JVD/Nodes/TM Lungs distant bs,  Distant BS with predominantly pseudowheeze RRR no s3 or murmur or increase in P2. no edema Abd soft and benign with nl excursion in the supine position. Ext warm without calf tenderness, cyanosis clubbing        Assessment & Plan:

## 2017-07-12 NOTE — Patient Instructions (Addendum)
For cough > mucinex dm 1200 mg every 12 hours and use use the flutter valve as needed but if still can't stop coughing then add  Tylenol #3 one every 4 hours as needed  Continue prednisone 40 x 2 , 30 x 2, 10 x 2 days, and 5 mg daily THEREAFTER  ZPAK  See Tammy NP in 6 weeks  with all your medications, even over the counter meds, separated in two separate bags, the ones you take no matter what vs the ones you stop once you feel better and take only as needed when you feel you need them.   Tammy  will generate for you a new user friendly medication calendar that will put [place] all on the same page re: your medication use.

## 2017-07-14 NOTE — Assessment & Plan Note (Addendum)
-   PFTs    03/09/2015  14% better p B2 and less cough p saba - 03/09/2015   > try dulera 100 2bid  - 08/31/2016   try increase to 200 2bid due to cough > no better so back to 100 2bid maint   - 07/12/2017  After extensive coaching HFA effectiveness =    75%   To treat her acutely rec add flutter valve/ tylenol #3 prn and zpak   Her dulera was on zero and she was not aware, also did not know how/ when to try saba before going to UC with flare so clearly opportunity to help her with med reconciliation issue going forward   To keep things simple, I have asked the patient to first separate medicines that are perceived as maintenance, that is to be taken daily "no matter what", from those medicines that are taken on only on an as-needed basis and I have given the patient examples of both, and then return to see our NP to generate a  detailed  medication calendar which should be followed until the next physician sees the patient and updates it.

## 2017-07-14 NOTE — Assessment & Plan Note (Signed)
Body mass index is 52.73 kg/m.  -  trending down/ encouraged Lab Results  Component Value Date   TSH 1.25 05/09/2017     Contributing to gerd risk/ doe/reviewed the need and the process to achieve and maintain neg calorie balance > defer f/u primary care including intermittently monitoring thyroid status

## 2017-07-14 NOTE — Assessment & Plan Note (Addendum)
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)   -Restarted Prednisone 02/24/08   -PFT's December 23, 2008 VC 35%  DLC0 31%   -PFT's 02/08/2014   VC 1.30 (49%) and DLCO is 70%  > rec pred 10/5 > did not tolerate - PFTs  03/09/2015     VC 1.27 (48%) and dlco is 72% @ 10 mg daily  - 02/06/2016 try 10 a/w 5 mg daily > did not try - 06/01/2016 try 10 a/w 5 daily > cough  flared p 1 week on lower dose  -ANA positive 10/2016 >refer to rheumatology  -HRCT chest essentially stable changes since 2005 10/09/2016   The goal with a chronic steroid dependent illness is always arriving at the lowest effective dose that controls the disease/symptoms and not accepting a set "formula" which is based on statistics or guidelines that don't always take into account patient  variability or the natural hx of the dz in every individual patient, which may well vary over time.  For now therefore I recommend the patient maintain  A floor of 5 mg daily if tol and then  Return  for med reconciliation but taper from 50 mg daily (rec by UC) over 6 days back to 5 mg   I had an extended discussion with the patient reviewing all relevant studies completed to date and  lasting 15 to 20 minutes of a 25 minute acute post UC  visit    Each maintenance medication was reviewed in detail including most importantly the difference between maintenance and prns and under what circumstances the prns are to be triggered using an action plan format that is not reflected in the computer generated alphabetically organized AVS.    Please see AVS for specific instructions unique to this visit that I personally wrote and verbalized to the the pt in detail and then reviewed with pt  by my nurse highlighting any  changes in therapy recommended at today's visit to their plan of care.

## 2017-07-20 DIAGNOSIS — J9611 Chronic respiratory failure with hypoxia: Secondary | ICD-10-CM | POA: Insufficient documentation

## 2017-07-20 NOTE — Assessment & Plan Note (Signed)
rx = 2lpm hs and prn with activity   Last hc03 = 31  05/09/17 so may be borderline hypercarbic as well > rx is wt loss/ no change 02 rx

## 2017-08-05 ENCOUNTER — Other Ambulatory Visit: Payer: Self-pay | Admitting: Internal Medicine

## 2017-08-08 NOTE — Progress Notes (Signed)
Subjective:    Patient ID: [name], female    DOB: 18-Nov-1970, 46 y.o.   MRN: [id number]  HPI The patient is here for follow up.   Diabetes: She is taking her medication daily as prescribed. She is compliant with a diabetic diet. She is not exercising regularly, but plans on starting when she changes insurance. She monitors her sugars and they have been running 133 this morning -- has been high since being on high dose steroids last month. Prior to that it was well controlled.  She feels the trulicity is working well.  She checks her feet daily and denies foot lesions. She is up-to-date with an ophthalmology examination - just had - [facility] on [address].   Hypertension: She is taking her medication daily. She is compliant with a low sodium diet.  She denies chest pain, palpitations, shortness of breath and regular headaches. She is not exercising regularly.  She does not monitor her blood pressure at home.    Hyperlipidemia: She is taking her medication daily. She is compliant with a low fat/cholesterol diet. She is not exercising regularly. She denies myalgias.   GERD:  She is taking her medication daily as prescribed.  She denies any GERD symptoms and feels her GERD is well controlled.    Medications and allergies reviewed with patient and updated if appropriate.  Patient Active Problem List   Diagnosis Date Noted  . Chronic respiratory failure with hypoxia ([place]) 07/20/2017  . Knee pain, left 02/02/2016  . Cough variant asthma 03/09/2015  . Diabetes type 2, uncontrolled ([place]) 12/09/2012  . Dyslipidemia   . Acute on chronic respiratory failure with hypoxia ([place]) 04/05/2012  . CONJUNCTIVITIS, ALLERGIC, SEASONAL 01/05/2010  . Allergic rhinitis 01/05/2010  . Essential hypertension 09/07/2009  . MUSCLE SPASM 09/07/2009  . DEPRESSION, MILD 03/24/2009  . HEADACHE 03/24/2009  . URINARY INCONTINENCE, URGE 03/24/2009  . CHRONIC RHINITIS 12/23/2008  . Severe obesity (BMI >= 40) ([place])  10/10/2007  . INTERSTITIAL LUNG DISEASE 10/10/2007  . GASTROESOPHAGEAL REFLUX DISEASE 10/10/2007    Current Outpatient Medications on File Prior to Visit  Medication Sig Dispense Refill  . acetaminophen-codeine (TYLENOL #3) 300-30 MG tablet One every 4 hours as needed for cough 40 tablet 0  . albuterol (PROVENTIL HFA;VENTOLIN HFA) 108 (90 Base) MCG/ACT inhaler Inhale 2 puffs into the lungs every 4 (four) hours as needed for wheezing or shortness of breath. 1 Inhaler 0  . aspirin 81 MG chewable tablet Chew 81 mg by mouth daily.    Marland Kitchen aspirin-acetaminophen-caffeine (EXCEDRIN MIGRAINE) 250-250-65 MG tablet Take 2 tablets by mouth daily as needed for migraine.    Marland Kitchen atorvastatin (LIPITOR) 20 MG tablet TAKE ONE TABLET BY MOUTH DAILY 90 tablet 1  . B COMPLEX VITAMINS SL Place 1 mL under the tongue 4 (four) times a week.    . budesonide-formoterol (SYMBICORT) 80-4.5 MCG/ACT inhaler Inhale 2 puffs into the lungs 2 (two) times daily. 1 Inhaler 0  . Cholecalciferol (VITAMIN D3) 5000 units CAPS Take 5,000 Units by mouth daily.    . diphenhydrAMINE (BENADRYL) 25 MG tablet Take 25-50 mg by mouth at bedtime as needed for allergies.    . Dulaglutide (TRULICITY) 1.5 UX/3.2TF SOPN Inject 1.5 mg into the skin once a week. 4 pen 5  . ferrous sulfate 325 (65 FE) MG EC tablet Take 325 mg by mouth daily.    . fluticasone (FLONASE) 50 MCG/ACT nasal spray Place 2 sprays into both nostrils daily as needed for allergies  or rhinitis.    Marland Kitchen glucose blood (ONE TOUCH ULTRA TEST) test strip USE TO CHECK BLOOD SUGARS TWICE A DAY 100 each 1  . JANUMET XR [phone] MG TB24 TAKE 1 TABLET BY MOUTH DAILY. 90 tablet 1  . loratadine (CLARITIN) 10 MG tablet Take 10 mg by mouth daily.     Marland Kitchen losartan (COZAAR) 25 MG tablet Take 0.5 tablets (12.5 mg total) by mouth daily. 45 tablet 3  . mometasone-formoterol (DULERA) 100-5 MCG/ACT AERO Take 2 puffs first thing in am and then another 2 puffs about 12 hours later. 1 Inhaler 11  . montelukast  (SINGULAIR) 10 MG tablet Take 1 tablet (10 mg total) by mouth at bedtime. 30 tablet 5  . pantoprazole (PROTONIX) 40 MG tablet Take 1 tablet (40 mg total) by mouth 2 (two) times daily before a meal. 60 tablet 11  . Respiratory Therapy Supplies (FLUTTER) DEVI Use as directed 1 each 0  . Tetrahydrozoline-Zn Sulfate (VISINE-AC OP) Apply 1 drop to eye daily as needed (allergies).    . Turmeric 500 MG CAPS Take 500 mg by mouth daily.    . predniSONE (DELTASONE) 5 MG tablet Take 5 mg daily by mouth.    . [DISCONTINUED] DETROL LA 2 MG 24 hr capsule TAKE 1 CAPSULE BY MOUTH DAILY 30 capsule 10   No current facility-administered medications on file prior to visit.     Past Medical History:  Diagnosis Date  . Allergic rhinitis   . Bronchitis   . Chronic rhinitis   . Cough   . Dyslipidemia   . Dysphagia   . GERD (gastroesophageal reflux disease)   . Headache(784.0)   . ILD (interstitial lung disease) ([place])   . Mild depression ([place])   . Morbid obesity ([place])   . Type II or diabetes mellitus, uncontrolled 11/2012 dx   Dx 12/08/12: a1c 10.0  . Unspecified essential hypertension   . Urge incontinence     Past Surgical History:  Procedure Laterality Date  . ABDOMINAL HYSTERECTOMY    . BILATERAL VATS ABLATION  02/15/04  . CESAREAN SECTION    . LUNG BIOPSY      Social History   Socioeconomic History  . Marital status: Married    Spouse name: None  . Number of children: 1  . Years of education: None  . Highest education level: None  Social Needs  . Financial resource strain: None  . Food insecurity - worry: None  . Food insecurity - inability: None  . Transportation needs - medical: None  . Transportation needs - non-medical: None  Occupational History  . 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 Smoking status: Never Smoker  . Smokeless tobacco: Never Used  . Tobacco comment: {  Substance and Sexual Activity  .  Alcohol use: No    Alcohol/week: 0.0 oz  . Drug use: No  . Sexual activity: None  Other Topics Concern  . 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 Sarcoidosis Mother        dxed by transbrochial bx  . Allergies Mother   . Heart disease Father   . Diabetes Father   . Allergies Father   . Coronary artery disease Father   . Cancer Maternal Grandmother  CA of unknown type  . Cancer Paternal Grandmother        CA of unknown type    Review of Systems  Constitutional: Negative for chills and fever.  Respiratory: Negative for cough, shortness of breath and wheezing.   Cardiovascular: Positive for leg swelling (from arthritis). Negative for chest pain and palpitations.  Neurological: Negative for light-headedness and headaches.       Objective:   Vitals:   08/09/17 0849  BP: 120/82  Pulse: 68  Temp: 98.8 F (37.1 C)  SpO2: 96%   Wt Readings from Last 3 Encounters:  08/09/17 270 lb 4 oz (122.6 kg)  07/12/17 270 lb (122.5 kg)  05/09/17 275 lb (124.7 kg)   Body mass index is 52.78 kg/m.   Physical Exam    Constitutional: Appears well-developed and well-nourished. No distress.  HENT:  Head: Normocephalic and atraumatic.  Neck: Neck supple. No tracheal deviation present. No thyromegaly present.  No cervical lymphadenopathy Cardiovascular: Normal rate, regular rhythm and normal heart sounds.   No murmur heard. No carotid bruit .  No edema Pulmonary/Chest: Effort normal and breath sounds normal. No respiratory distress. No has no wheezes. No rales.  Skin: Skin is warm and dry. Not diaphoretic.  Psychiatric: Normal mood and affect. Behavior is normal.      Assessment & Plan:    See Problem List for Assessment and Plan of chronic medical problems.

## 2017-08-08 NOTE — Patient Instructions (Addendum)
  Have blood work today.   All other Health Maintenance issues reviewed.   All recommended immunizations and age-appropriate screenings are up-to-date or discussed.  Flu immunization administered today.   Medications reviewed and updated.  No changes recommended at this time.   Please followup in 6 months

## 2017-08-09 ENCOUNTER — Other Ambulatory Visit (INDEPENDENT_AMBULATORY_CARE_PROVIDER_SITE_OTHER): Payer: Medicare Other

## 2017-08-09 ENCOUNTER — Ambulatory Visit (INDEPENDENT_AMBULATORY_CARE_PROVIDER_SITE_OTHER): Payer: Medicare Other | Admitting: Internal Medicine

## 2017-08-09 ENCOUNTER — Encounter: Payer: Self-pay | Admitting: Internal Medicine

## 2017-08-09 VITALS — BP 120/82 | HR 68 | Temp 98.8°F | Ht 60.0 in | Wt 270.2 lb

## 2017-08-09 DIAGNOSIS — Z23 Encounter for immunization: Secondary | ICD-10-CM | POA: Diagnosis not present

## 2017-08-09 DIAGNOSIS — I10 Essential (primary) hypertension: Secondary | ICD-10-CM

## 2017-08-09 DIAGNOSIS — K219 Gastro-esophageal reflux disease without esophagitis: Secondary | ICD-10-CM | POA: Diagnosis not present

## 2017-08-09 DIAGNOSIS — E1165 Type 2 diabetes mellitus with hyperglycemia: Secondary | ICD-10-CM

## 2017-08-09 DIAGNOSIS — E785 Hyperlipidemia, unspecified: Secondary | ICD-10-CM

## 2017-08-09 LAB — COMPREHENSIVE METABOLIC PANEL
ALT: 14 U/L (ref 0–35)
AST: 14 U/L (ref 0–37)
Albumin: 3.7 g/dL (ref 3.5–5.2)
Alkaline Phosphatase: 64 U/L (ref 39–117)
BUN: 9 mg/dL (ref 6–23)
CALCIUM: 9.2 mg/dL (ref 8.4–10.5)
CO2: 28 meq/L (ref 19–32)
CREATININE: 0.56 mg/dL (ref 0.40–1.20)
Chloride: 99 mEq/L (ref 96–112)
GFR: 150.31 mL/min (ref >60.00)
Glucose, Bld: 141 mg/dL — ABNORMAL HIGH (ref 70–99)
POTASSIUM: 3.9 meq/L (ref 3.5–5.1)
SODIUM: 137 meq/L (ref 135–145)
Total Bilirubin: 0.3 mg/dL (ref 0.2–1.2)
Total Protein: 7 g/dL (ref 6.0–8.3)

## 2017-08-09 LAB — HEMOGLOBIN A1C: Hgb A1c MFr Bld: 9 % — ABNORMAL HIGH (ref 4.6–6.5)

## 2017-08-09 NOTE — Assessment & Plan Note (Signed)
GERD controlled Continue daily medication  

## 2017-08-09 NOTE — Assessment & Plan Note (Signed)
BP well controlled Current regimen effective and well tolerated Continue current medications at current doses cmp  

## 2017-08-09 NOTE — Assessment & Plan Note (Signed)
Lipids have been controlled Continue statin 

## 2017-08-09 NOTE — Assessment & Plan Note (Signed)
Sugars have been much better controlled on her current regimen, but she was on high dose prednisone last month and her sugars were in the 200's -- finally starting to be better controlled Will check a1c today, but given that sugars have been controlled at home outside of the prednisone will not make any changes Continue current medication Encouraged regular exercise and weight loss Diabetic diet

## 2017-08-10 ENCOUNTER — Encounter: Payer: Self-pay | Admitting: Internal Medicine

## 2017-08-26 ENCOUNTER — Encounter: Payer: Self-pay | Admitting: Adult Health

## 2017-08-26 ENCOUNTER — Ambulatory Visit (INDEPENDENT_AMBULATORY_CARE_PROVIDER_SITE_OTHER): Payer: Medicare Other | Admitting: Adult Health

## 2017-08-26 VITALS — BP 130/84 | HR 74 | Ht 60.0 in | Wt 269.8 lb

## 2017-08-26 DIAGNOSIS — J45991 Cough variant asthma: Secondary | ICD-10-CM | POA: Diagnosis not present

## 2017-08-26 DIAGNOSIS — J9611 Chronic respiratory failure with hypoxia: Secondary | ICD-10-CM | POA: Diagnosis not present

## 2017-08-26 DIAGNOSIS — J8410 Pulmonary fibrosis, unspecified: Secondary | ICD-10-CM | POA: Diagnosis not present

## 2017-08-26 MED ORDER — BUDESONIDE-FORMOTEROL FUMARATE 80-4.5 MCG/ACT IN AERO: 2.0000 | INHALATION_SPRAY | Freq: Two times a day (BID) | RESPIRATORY_TRACT | 0 refills | Status: DC

## 2017-08-26 NOTE — Assessment & Plan Note (Signed)
Stable without flare   Plan  Patient Instructions  Follow med calendar closely and bring to each visit  Follow up Dr. Melvyn Novas  In 3 months and As needed

## 2017-08-26 NOTE — Progress Notes (Signed)
Chart and office note reviewed in detail  > agree with a/p as outlined    

## 2017-08-26 NOTE — Patient Instructions (Signed)
Follow med calendar closely and bring to each visit.   Follow up Dr. Wert  In 3 months  and As needed         

## 2017-08-26 NOTE — Progress Notes (Signed)
@Patient  ID: [name], female    DOB: 1971-04-11, 46 y.o.   MRN: [id number]  Chief Complaint  Patient presents with  . Follow-up    Asthma     Referring provider: Binnie Rail, MD  HPI: 46 yo female never smoker dx with NSIP on open lung biopsy in May 2005. Tx intermittently with steroids   TEST  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)   -Restarted Prednisone 02/24/08   -PFT's December 23, 2008 VC 35%  DLC0 31%   -PFT's 02/08/2014   VC 1.30 (49%) and DLCO is 70%  > rec pred 10/5 > did not tolerate - PFTs  03/09/2015     VC 1.27 (48%) and dlco is 72% @ 10 mg daily  - 02/06/2016 try 10 a/w 5 mg daily > did not try - 06/01/2016 try 10 a/w 5 daily > cough  flared p 1 week on lower dose  - 04/05/2012  Walked 2lpm x 3 laps @ 185 ft each stopped due to  desat corrected on 3lpm so rec 3lpm with exertion  - 10/06/2013  Walked RA x 2 laps @ 185 ft each stopped due to  Cough and desat to 86% at more rapid pace than usual and on 3lpm could do 3 laps s difficulty   09/02/2016 = no 02 at rest, 3lpm with exertion, 2lpm at hs  10/2016 HRCT chest >Basilar ILD - no sign change from 2005 -NSIP  Echo 12/2016 >EF 60-65%,  RHC 01/2017 >no pulmonary HTN   08/26/2017 Follow up : ILD/NSIP (bx proven) , Asthma  Pt returns for 6 week follow up . Says breathing has been doing okay with no flare of cough or wheezing . Remains on Symbicort. She has hard time affording so depends on samples of Dulera and Symbicort . We reviewed her meds and organized them into med calendar with pt education.   Denies fever, chest pain, orthopnea, or discolored mucus.   O2 sats >90% on room air at rest , O2 sats walking 86%, on room air. O2 2l/m O2 sats >90%.  Remains on O2 2 l/m act and 2 l/m At bedtime   Wants lightweight POC.    Reviewed all her meds and organized them into a med calendar with pt educaiton . Appears to be taking correctly.   Allergies  Allergen Reactions  . Penicillins Shortness Of Breath,  Swelling and Other (See Comments)    Has patient had a PCN reaction causing immediate rash, facial/tongue/throat swelling, SOB or lightheadedness with hypotension: yes Has patient had a PCN reaction causing severe rash involving mucus membranes or skin necrosis: no Has patient had a PCN reaction that required hospitalization no Has patient had a PCN reaction occurring within the last 10 years: no If all of the above answers are "NO", then may proceed with Cephalosporin use.   Marland Kitchen Peach Flavor Hives and Other (See Comments)    Fresh peaches only  . Levaquin [Levofloxacin] Nausea Only    Nausea, bloating    Immunization History  Administered Date(s) Administered  . Influenza Split 06/21/2011, 07/29/2012  . Influenza Whole 07/22/2009  . Influenza,inj,Quad PF,6+ Mos 06/15/2013, 08/19/2014, 09/19/2015, 08/31/2016, 08/09/2017  . Pneumococcal Conjugate-13 09/19/2015  . Pneumococcal Polysaccharide-23 07/22/2009  . Td 03/24/2009    Past Medical History:  Diagnosis Date  . Allergic rhinitis   . Bronchitis   . Chronic rhinitis   . Cough   . Dyslipidemia   . Dysphagia   . GERD (gastroesophageal  reflux disease)   . Headache(784.0)   . ILD (interstitial lung disease) ([place])   . Mild depression ([place])   . Morbid obesity ([place])   . Type II or diabetes mellitus, uncontrolled 11/2012 dx   Dx 12/08/12: a1c 10.0  . Unspecified essential hypertension   . Urge incontinence     Tobacco History: Social History   Tobacco Use  Smoking Status Never Smoker  Smokeless Tobacco Never Used  Tobacco Comment   {   Counseling given: Not Answered Comment: {   Outpatient Encounter Medications as of 08/26/2017  Medication Sig  . acetaminophen-codeine (TYLENOL #3) 300-30 MG tablet One every 4 hours as needed for cough  . albuterol (PROVENTIL HFA;VENTOLIN HFA) 108 (90 Base) MCG/ACT inhaler Inhale 2 puffs into the lungs every 4 (four) hours as needed for wheezing or shortness of breath.  Marland Kitchen aspirin 81 MG  chewable tablet Chew 81 mg by mouth daily.  Marland Kitchen aspirin-acetaminophen-caffeine (EXCEDRIN MIGRAINE) 250-250-65 MG tablet Take 2 tablets by mouth daily as needed for migraine.  Marland Kitchen atorvastatin (LIPITOR) 20 MG tablet TAKE ONE TABLET BY MOUTH DAILY  . B COMPLEX VITAMINS SL Place 1 mL under the tongue 4 (four) times a week.  . budesonide-formoterol (SYMBICORT) 80-4.5 MCG/ACT inhaler Inhale 2 puffs into the lungs 2 (two) times daily.  . budesonide-formoterol (SYMBICORT) 80-4.5 MCG/ACT inhaler Inhale 2 puffs into the lungs 2 (two) times daily.  . Cholecalciferol (VITAMIN D3) 5000 units CAPS Take 5,000 Units by mouth daily.  . diphenhydrAMINE (BENADRYL) 25 MG tablet Take 25-50 mg by mouth at bedtime as needed for allergies.  . Dulaglutide (TRULICITY) 1.5 VO/3.5KK SOPN Inject 1.5 mg into the skin once a week.  . ferrous sulfate 325 (65 FE) MG EC tablet Take 325 mg by mouth daily.  . fluticasone (FLONASE) 50 MCG/ACT nasal spray Place 2 sprays into both nostrils daily as needed for allergies or rhinitis.  Marland Kitchen glucose blood (ONE TOUCH ULTRA TEST) test strip USE TO CHECK BLOOD SUGARS TWICE A DAY  . JANUMET XR [phone] MG TB24 TAKE 1 TABLET BY MOUTH DAILY.  Marland Kitchen loratadine (CLARITIN) 10 MG tablet Take 10 mg by mouth daily.   Marland Kitchen losartan (COZAAR) 25 MG tablet Take 0.5 tablets (12.5 mg total) by mouth daily.  . mometasone-formoterol (DULERA) 100-5 MCG/ACT AERO Take 2 puffs first thing in am and then another 2 puffs about 12 hours later.  . montelukast (SINGULAIR) 10 MG tablet Take 1 tablet (10 mg total) by mouth at bedtime.  . pantoprazole (PROTONIX) 40 MG tablet Take 1 tablet (40 mg total) by mouth 2 (two) times daily before a meal.  . predniSONE (DELTASONE) 5 MG tablet Take 5 mg daily by mouth.  . Respiratory Therapy Supplies (FLUTTER) DEVI Use as directed  . Tetrahydrozoline-Zn Sulfate (VISINE-AC OP) Apply 1 drop to eye daily as needed (allergies).  . Turmeric 500 MG CAPS Take 500 mg by mouth daily.   No  facility-administered encounter medications on file as of 08/26/2017.      Review of Systems  Constitutional:   No  weight loss, night sweats,  Fevers, chills,  +fatigue, or  lassitude.  HEENT:   No headaches,  Difficulty swallowing,  Tooth/dental problems, or  Sore throat,                No sneezing, itching, ear ache, nasal congestion, post nasal drip,   CV:  No chest pain,  Orthopnea, PND, swelling in lower extremities, anasarca, dizziness, palpitations, syncope.   GI  No heartburn, indigestion, abdominal pain, nausea, vomiting, diarrhea, change in bowel habits, loss of appetite, bloody stools.   Resp:    No chest wall deformity  Skin: no rash or lesions.  GU: no dysuria, change in color of urine, no urgency or frequency.  No flank pain, no hematuria   MS:  No joint pain or swelling.  No decreased range of motion.  No back pain.    Physical Exam  BP 130/84 (BP Location: Left Arm, Cuff Size: Large)   Pulse 74   Ht 5' (1.524 m)   Wt 269 lb 12.8 oz (122.4 kg)   SpO2 97%   BMI 52.69 kg/m   GEN: A/Ox3; pleasant , NAD,obese    HEENT:  [place]/AT,  EACs-clear, TMs-wnl, NOSE-clear, THROAT-clear, no lesions, no postnasal drip or exudate noted. Class 2-3 MP airway   NECK:  Supple w/ fair ROM; no JVD; normal carotid impulses w/o bruits; no thyromegaly or nodules palpated; no lymphadenopathy.    RESP  Clear  P & A; w/o, wheezes/ rales/ or rhonchi. no accessory muscle use, no dullness to percussion  CARD:  RRR, no m/r/g, no peripheral edema, pulses intact, no cyanosis or clubbing.  GI:   Soft & nt; nml bowel sounds; no organomegaly or masses detected.   Musco: Warm bil, no deformities or joint swelling noted.   Neuro: alert, no focal deficits noted.    Skin: Warm, no lesions or rashes     BNP  Imaging: No results found.   Assessment & Plan:   Cough variant asthma Stable without flare   Plan  Patient Instructions  Follow med calendar closely and bring to each  visit  Follow up Dr. Melvyn Novas  In 3 months and As needed       INTERSTITIAL LUNG DISEASE Stable without flare   Plan  Patient Instructions  Follow med calendar closely and bring to each visit  Follow up Dr. Melvyn Novas  In 3 months and As needed       Chronic respiratory failure with hypoxia ([place]) Cont on O2 2lm with act and At bedtime  .      Rexene Edison, NP 08/26/2017

## 2017-08-26 NOTE — Assessment & Plan Note (Signed)
Cont on O2 2lm with act and At bedtime  .

## 2017-09-12 NOTE — Addendum Note (Signed)
Addended by: Parke Poisson E on: 09/12/2017 02:36 PM   Modules accepted: Orders

## 2017-09-21 ENCOUNTER — Ambulatory Visit (HOSPITAL_COMMUNITY)
Admission: EM | Admit: 2017-09-21 | Discharge: 2017-09-21 | Disposition: A | Discharge status: Home / Self Care | Payer: Medicare Other | Attending: Family Medicine | Admitting: Family Medicine

## 2017-09-21 ENCOUNTER — Other Ambulatory Visit: Payer: Self-pay

## 2017-09-21 ENCOUNTER — Encounter (HOSPITAL_COMMUNITY): Payer: Self-pay | Admitting: Emergency Medicine

## 2017-09-21 ENCOUNTER — Ambulatory Visit (INDEPENDENT_AMBULATORY_CARE_PROVIDER_SITE_OTHER): Payer: Medicare Other

## 2017-09-21 DIAGNOSIS — Z7952 Long term (current) use of systemic steroids: Secondary | ICD-10-CM | POA: Diagnosis not present

## 2017-09-21 DIAGNOSIS — Z7984 Long term (current) use of oral hypoglycemic drugs: Secondary | ICD-10-CM | POA: Insufficient documentation

## 2017-09-21 DIAGNOSIS — E785 Hyperlipidemia, unspecified: Secondary | ICD-10-CM | POA: Insufficient documentation

## 2017-09-21 DIAGNOSIS — Z8249 Family history of ischemic heart disease and other diseases of the circulatory system: Secondary | ICD-10-CM | POA: Insufficient documentation

## 2017-09-21 DIAGNOSIS — N390 Urinary tract infection, site not specified: Secondary | ICD-10-CM | POA: Diagnosis not present

## 2017-09-21 DIAGNOSIS — Z880 Allergy status to penicillin: Secondary | ICD-10-CM | POA: Diagnosis not present

## 2017-09-21 DIAGNOSIS — Z7982 Long term (current) use of aspirin: Secondary | ICD-10-CM | POA: Insufficient documentation

## 2017-09-21 DIAGNOSIS — Z6841 Body Mass Index (BMI) 40.0 and over, adult: Secondary | ICD-10-CM | POA: Insufficient documentation

## 2017-09-21 DIAGNOSIS — R8281 Pyuria: Secondary | ICD-10-CM

## 2017-09-21 DIAGNOSIS — R1030 Lower abdominal pain, unspecified: Secondary | ICD-10-CM | POA: Diagnosis not present

## 2017-09-21 DIAGNOSIS — K5901 Slow transit constipation: Secondary | ICD-10-CM | POA: Diagnosis not present

## 2017-09-21 DIAGNOSIS — J849 Interstitial pulmonary disease, unspecified: Secondary | ICD-10-CM | POA: Insufficient documentation

## 2017-09-21 DIAGNOSIS — I10 Essential (primary) hypertension: Secondary | ICD-10-CM | POA: Diagnosis not present

## 2017-09-21 DIAGNOSIS — Z833 Family history of diabetes mellitus: Secondary | ICD-10-CM | POA: Insufficient documentation

## 2017-09-21 DIAGNOSIS — Z881 Allergy status to other antibiotic agents status: Secondary | ICD-10-CM | POA: Diagnosis not present

## 2017-09-21 DIAGNOSIS — Z79899 Other long term (current) drug therapy: Secondary | ICD-10-CM | POA: Diagnosis not present

## 2017-09-21 DIAGNOSIS — K219 Gastro-esophageal reflux disease without esophagitis: Secondary | ICD-10-CM | POA: Diagnosis not present

## 2017-09-21 DIAGNOSIS — Z7951 Long term (current) use of inhaled steroids: Secondary | ICD-10-CM | POA: Diagnosis not present

## 2017-09-21 DIAGNOSIS — Z91018 Allergy to other foods: Secondary | ICD-10-CM | POA: Diagnosis not present

## 2017-09-21 DIAGNOSIS — E119 Type 2 diabetes mellitus without complications: Secondary | ICD-10-CM | POA: Insufficient documentation

## 2017-09-21 LAB — POCT URINALYSIS DIP (DEVICE)
BILIRUBIN URINE: NEGATIVE
Glucose, UA: NEGATIVE mg/dL
HGB URINE DIPSTICK: NEGATIVE
Ketones, ur: 15 mg/dL — AB
NITRITE: POSITIVE — AB
PH: 5.5 (ref 5.0–8.0)
Protein, ur: NEGATIVE mg/dL
Specific Gravity, Urine: >=1.03 (ref 1.005–1.030)
UROBILINOGEN UA: 0.2 mg/dL (ref 0.0–1.0)

## 2017-09-21 MED ORDER — POLYETHYLENE GLYCOL 3350 17 GM/SCOOP PO POWD: 1.0000 | Freq: Two times a day (BID) | ORAL | 0 refills | Status: DC

## 2017-09-21 MED ORDER — NITROFURANTOIN MONOHYD MACRO 100 MG PO CAPS: 100.0000 mg | ORAL_CAPSULE | Freq: Two times a day (BID) | ORAL | 0 refills | Status: DC

## 2017-09-21 NOTE — ED Notes (Signed)
Urine sample obtained.  States had "a blowout" BM just now, but is still uncomfortable.

## 2017-09-21 NOTE — ED Provider Notes (Addendum)
[place]   [id number] 09/21/17 Arrival Time: 29   SUBJECTIVE:  [name] is a 46 y.o. female who presents to the urgent care with complaint of lower abdominal pain since Thursday.  She reports small BM's that are soft.  She states she is unable to eat anything starting today.  She denies any problems with urination.  Some nausea, no vomiting  Took a dulcolax without results.  Passing gas and burping.  Past Medical History:  Diagnosis Date  . Allergic rhinitis   . Bronchitis   . Chronic rhinitis   . Cough   . Dyslipidemia   . Dysphagia   . GERD (gastroesophageal reflux disease)   . Headache(784.0)   . ILD (interstitial lung disease) ([place])   . Mild depression ([place])   . Morbid obesity ([place])   . Type II or diabetes mellitus, uncontrolled 11/2012 dx   Dx 12/08/12: a1c 10.0  . Unspecified essential hypertension   . Urge incontinence    Family History  Problem Relation Age of Onset  . Sarcoidosis Mother        dxed by transbrochial bx  . Allergies Mother   . Heart disease Father   . Diabetes Father   . Allergies Father   . Coronary artery disease Father   . Cancer Maternal Grandmother        CA of unknown type  . Cancer Paternal Grandmother        CA of unknown type   Social History   Socioeconomic History  . Marital status: Married    Spouse name: Not on file  . Number of children: 1  . Years of education: Not on file  . Highest education level: Not on file  Social Needs  . Financial resource strain: Not on file  . Food insecurity - worry: Not on file  . Food insecurity - inability: Not on file  . Transportation needs - medical: Not on file  . Transportation needs - non-medical: Not on file  Occupational History  . 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 Smoking status: Never Smoker  . Smokeless tobacco: Never Used  . Tobacco comment: {  Substance and Sexual Activity  .  Alcohol use: No    Alcohol/week: 0.0 oz  . Drug use: No  . Sexual activity: Not on file  Other Topics Concern  . 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 albuterol (PROVENTIL HFA;VENTOLIN HFA) 108 (90 Base) MCG/ACT inhaler Inhale 2 puffs into the lungs every 4 (four) hours as needed for wheezing or shortness of breath.  Marland Kitchen aspirin 81 MG chewable tablet Chew 81 mg by mouth daily.  Marland Kitchen atorvastatin (LIPITOR) 20 MG tablet TAKE ONE TABLET BY MOUTH DAILY  . B COMPLEX VITAMINS SL Place 1 mL under the tongue 4 (four) times a week.  . budesonide-formoterol (SYMBICORT) 80-4.5 MCG/ACT inhaler Inhale 2 puffs into the lungs 2 (two) times daily.  . budesonide-formoterol (SYMBICORT) 80-4.5 MCG/ACT inhaler Inhale 2 puffs into the lungs 2 (two) times daily.  . calcium carbonate (CALCIUM 600) 600 MG TABS tablet Take 600 mg by mouth daily with breakfast.  . Dulaglutide (TRULICITY) 1.5 BH/4.1PF SOPN Inject 1.5 mg into the skin once a week.  . ferrous sulfate 325 (65 FE) MG  EC tablet Take 325 mg by mouth daily.  . fluticasone (FLONASE) 50 MCG/ACT nasal spray Place 2 sprays into both nostrils daily as needed for allergies or rhinitis.  Marland Kitchen glucose blood (ONE TOUCH ULTRA TEST) test strip USE TO CHECK BLOOD SUGARS TWICE A DAY  . hydrochlorothiazide (HYDRODIURIL) 12.5 MG tablet Take 12.5 mg by mouth daily as needed (leg swelling).  Marland Kitchen JANUMET XR [phone] MG TB24 TAKE 1 TABLET BY MOUTH DAILY.  Marland Kitchen loratadine (CLARITIN) 10 MG tablet Take 10 mg by mouth daily.   Marland Kitchen losartan (COZAAR) 25 MG tablet Take 0.5 tablets (12.5 mg total) by mouth daily.  . mometasone-formoterol (DULERA) 100-5 MCG/ACT AERO Take 2 puffs first thing in am and then another 2 puffs about 12 hours later.  . montelukast (SINGULAIR) 10 MG tablet Take 1 tablet (10 mg total) by mouth at bedtime.  Marland Kitchen oxybutynin (DITROPAN-XL) 10 MG 24 hr  tablet Take 10 mg by mouth at bedtime.  . pantoprazole (PROTONIX) 40 MG tablet Take 1 tablet (40 mg total) by mouth 2 (two) times daily before a meal.  . predniSONE (DELTASONE) 5 MG tablet Take 5 mg daily by mouth.  . Respiratory Therapy Supplies (FLUTTER) DEVI Use as directed  . Turmeric 500 MG CAPS Take 500 mg by mouth daily.   Allergies  Allergen Reactions  . Penicillins Shortness Of Breath, Swelling and Other (See Comments)    Has patient had a PCN reaction causing immediate rash, facial/tongue/throat swelling, SOB or lightheadedness with hypotension: yes Has patient had a PCN reaction causing severe rash involving mucus membranes or skin necrosis: no Has patient had a PCN reaction that required hospitalization no Has patient had a PCN reaction occurring within the last 10 years: no If all of the above answers are "NO", then may proceed with Cephalosporin use.   Marland Kitchen Peach Flavor Hives and Other (See Comments)    Fresh peaches only  . Levaquin [Levofloxacin] Nausea Only    Nausea, bloating      ROS: As per HPI, remainder of ROS negative.   OBJECTIVE:   Vitals:   09/21/17 1536  BP: 128/83  Pulse: (!) 112  Temp: 97.8 F (36.6 C)  TempSrc: Oral  SpO2: 99%     General appearance: alert; no distress Eyes: PERRL; EOMI; conjunctiva normal HENT: normocephalic; atraumatic;  Neck: supple Lungs: clear to auscultation bilaterally Heart: regular rate and rhythm Abdomen: soft, mild diffusely tender; bowel sounds normal; no masses or organomegaly; no guarding or rebound tenderness;  Note:  Morbid obesity hinders quality of exam Back: no CVA tenderness Extremities: no cyanosis or edema; symmetrical with no gross deformities Skin: warm and dry Neurologic: normal gait; grossly normal Psychological: alert and cooperative; normal mood and affect   Labs:  Results for orders placed or performed during the hospital encounter of 09/21/17  POCT urinalysis dip (device)  Result Value  Ref Range   Glucose, UA NEGATIVE NEGATIVE mg/dL   Bilirubin Urine NEGATIVE NEGATIVE   Ketones, ur 15 (A) NEGATIVE mg/dL   Specific Gravity, Urine >=1.030 1.005 - 1.030   Hgb urine dipstick NEGATIVE NEGATIVE   pH 5.5 5.0 - 8.0   Protein, ur NEGATIVE NEGATIVE mg/dL   Urobilinogen, UA 0.2 0.0 - 1.0 mg/dL   Nitrite POSITIVE (A) NEGATIVE   Leukocytes, UA TRACE (A) NEGATIVE    Labs Reviewed  POCT URINALYSIS DIP (DEVICE) - Abnormal; Notable for the following components:      Result Value   Ketones, ur 15 (*)  Nitrite POSITIVE (*)    Leukocytes, UA TRACE (*)    All other components within normal limits  URINE CULTURE    No results found.     ASSESSMENT & PLAN:  1. Pyuria   2. Slow transit constipation     Meds ordered this encounter  Medications  . nitrofurantoin, macrocrystal-monohydrate, (MACROBID) 100 MG capsule    Sig: Take 1 capsule (100 mg total) by mouth 2 (two) times daily.    Dispense:  10 capsule    Refill:  0  . polyethylene glycol powder (MIRALAX) powder    Sig: Take 255 g by mouth 2 (two) times daily with a meal.    Dispense:  255 g    Refill:  0    Reviewed expectations re: course of current medical issues. Questions answered. Outlined signs and symptoms indicating need for more acute intervention. Patient verbalized understanding. After Visit Summary given.    Procedures:      Robyn Haber, MD 09/21/17 5051    Robyn Haber, MD 09/21/17 [phone]

## 2017-09-21 NOTE — ED Triage Notes (Signed)
Pt reports lower abdominal pain since Thursday.  She reports small BM's that are soft.  She states she is unable to eat anything starting today.  She denies any problems with urination.

## 2017-09-21 NOTE — Discharge Instructions (Signed)
The urine test suggests that you have a urinary tract infection, hence the antibiotic twice a day.  Take the miralax twice a day for constipation. Continue this for 5 days.  If pain worsens, you will need further evaluation in the emergency department

## 2017-09-24 LAB — URINE CULTURE: Culture: >=100000 — AB

## 2017-10-07 ENCOUNTER — Encounter: Payer: Self-pay | Admitting: Internal Medicine

## 2017-10-18 ENCOUNTER — Encounter: Payer: Self-pay | Admitting: Internal Medicine

## 2017-10-19 MED ORDER — METFORMIN HCL ER (MOD) 1000 MG PO TB24: 1000.0000 mg | ORAL_TABLET | Freq: Two times a day (BID) | ORAL | 5 refills | Status: DC

## 2017-10-19 MED ORDER — SAXAGLIPTIN HCL 5 MG PO TABS: 5.0000 mg | ORAL_TABLET | Freq: Every day | ORAL | 5 refills | Status: DC

## 2017-10-21 NOTE — Progress Notes (Signed)
[facility] [address], [place] [address] Phone: [phone] Subjective:    I'm seeing this patient by the request  of:  Binnie Rail, MD   CC: Right ankle pain  OFB:[id number]  [name] is a 47 y.o. female coming in with complaint of right ankle pain. She is diabetic. States it has been gradually getting worse. Doesn't remember any injury. Sometimes she can't walk no matter what shoe she wears. Feels like "bone on bone". Her ankle swells. Keeps her up at night. Has slept in a boot before. Osteopenia.   Onset- Chronic Character- Dull, achy, consistent Location- Lateral, achilles  Aggravating- Plantar flexion, standing on toes, staying off of it Relieving- Prednisone   Past Medical History:  Diagnosis Date  . Allergic rhinitis   . Bronchitis   . Chronic rhinitis   . Cough   . Dyslipidemia   . Dysphagia   . GERD (gastroesophageal reflux disease)   . Headache(784.0)   . ILD (interstitial lung disease) ([place])   . Mild depression ([place])   . Morbid obesity ([place])   . Type II or diabetes mellitus, uncontrolled 11/2012 dx   Dx 12/08/12: a1c 10.0  . Unspecified essential hypertension   . Urge incontinence    Past Surgical History:  Procedure Laterality Date  . ABDOMINAL HYSTERECTOMY    . BILATERAL VATS ABLATION  02/15/04  . CESAREAN SECTION    . LUNG BIOPSY    . RIGHT HEART CATH N/A 01/30/2017   Procedure: Right Heart Cath;  Surgeon: Larey Dresser, MD;  Location: [facility];  Service: Cardiovascular;  Laterality: N/A;   Social History   Socioeconomic History  . Marital status: Married    Spouse name: None  . Number of children: 1  . Years of education: None  . Highest education level: None  Social Needs  . Financial resource strain: None  . Food insecurity - worry: None  . Food insecurity - inability: None  . Transportation needs - medical: None  . Transportation needs - non-medical: None  Occupational History  .  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 Smoking status: Never Smoker  . Smokeless tobacco: Never Used  . Tobacco comment: {  Substance and Sexual Activity  . Alcohol use: No    Alcohol/week: 0.0 oz  . Drug use: No  . Sexual activity: None  Other Topics Concern  . 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 Penicillins Shortness Of Breath, Swelling and Other (See Comments)    Has patient had a PCN reaction causing immediate rash, facial/tongue/throat swelling, SOB or lightheadedness with hypotension: yes Has patient had a PCN reaction causing severe rash involving mucus membranes or skin necrosis: no Has patient had a PCN reaction that required hospitalization no Has patient had a PCN reaction occurring within the last 10 years: no If all of the above answers are "NO", then may proceed with Cephalosporin use.   Marland Kitchen Peach Flavor Hives and Other (See Comments)    Fresh peaches only  . Levaquin [Levofloxacin] Nausea Only    Nausea, bloating   Family History  Problem Relation Age of Onset  . Sarcoidosis Mother        dxed by transbrochial bx  . Allergies  Mother   . Heart disease Father   . Diabetes Father   . Allergies Father   . Coronary artery disease Father   . Cancer Maternal Grandmother        CA of unknown type  . Cancer Paternal Grandmother        CA of unknown type     Past medical history, social, surgical and family history all reviewed in electronic medical record.  No pertanent information unless stated regarding to the chief complaint.   Review of Systems:Review of systems updated and as accurate as of 10/22/17  No headache, visual changes, nausea, vomiting, diarrhea, constipation, dizziness, abdominal pain, skin rash, fevers, chills, night sweats, weight loss, swollen lymph nodes,  body aches, joint swelling, muscle aches, chest pain, shortness of breath, mood changes.   Objective  Blood pressure 126/90, pulse 92, height 5' (1.524 m), weight 271 lb (122.9 kg), SpO2 93 %. Systems examined below as of 10/22/17   General: No apparent distress alert and oriented x3 mood and affect normal, dressed appropriately.  HEENT: Pupils equal, extraocular movements intact  Respiratory: Patient's speak in full sentences and does not appear short of breath  Cardiovascular: No lower extremity edema, non tender, no erythema  Skin: Warm dry intact with no signs of infection or rash on extremities or on axial skeleton.  Abdomen: Soft nontender  Neuro: Cranial nerves II through XII are intact, neurovascularly intact in all extremities with 2+ DTRs and 2+ pulses.  Lymph: No lymphadenopathy of posterior or anterior cervical chain or axillae bilaterally.  Gait normal with good balance and coordination.  MSK:  Non tender with full range of motion and good stability and symmetric strength and tone of shoulders, elbows, wrist, hip, knee bilaterally.  Ankle: Right Swelling over the lateral aspect of the perennials. Range of motion is full in all directions.  Very mild discomfort with full inversion Strength is 5/5 in all directions. Stable lateral and medial ligaments; squeeze test and kleiger test unremarkable; Talar dome nontender; No pain at base of 5th MT; No tenderness over cuboid; No tenderness over N spot or navicular prominence No tenderness on posterior aspects of lateral and medial malleolus Positive discomfort over the peroneal Able to walk 4 steps. Severe pes planus bilaterally  MSK US performed of: Right ankle This study was ordered, performed, and interpreted by Charlann Boxer D.O.  Foot/Ankle:   All structures visualized.   Talar dome unremarkable  Ankle mortise without effusion. Peroneus longus and brevis tendons noted to share within the tendon sheath.  Mild hypoechoic  changes.    IMPRESSION: Chronic peroneal tendinitis  97110; 15 additional minutes spent for Therapeutic exercises as stated in above notes.  This included exercises focusing on stretching, strengthening, with significant focus on eccentric aspects.   Long term goals include an improvement in range of motion, strength, endurance as well as avoiding reinjury. Patient's frequency would include in 1-2 times a day, 3-5 times a week for a duration of 6-12 weeks. Ankle strengthening that included:  Basic range of motion exercises to allow proper full motion at ankle Stretching of the lower leg and hamstrings  Theraband exercises for the lower leg - inversion, eversion, dorsiflexion and plantarflexion each to be completed with a theraband Balance exercises to increase proprioception Weight bearing exercises to increase strength and balance   Proper technique shown and discussed handout in great detail with ATC.  All questions were discussed and answered.      Impression and Recommendations:  This case required medical decision making of moderate complexity.      Note: This dictation was prepared with Dragon dictation along with smaller phrase technology. Any transcriptional errors that result from this process are unintentional.

## 2017-10-22 ENCOUNTER — Encounter: Payer: Self-pay | Admitting: Family Medicine

## 2017-10-22 ENCOUNTER — Ambulatory Visit: Payer: Self-pay

## 2017-10-22 ENCOUNTER — Telehealth: Payer: Self-pay | Admitting: Emergency Medicine

## 2017-10-22 ENCOUNTER — Ambulatory Visit: Payer: Medicare Other | Admitting: Family Medicine

## 2017-10-22 ENCOUNTER — Other Ambulatory Visit: Payer: Self-pay

## 2017-10-22 VITALS — BP 126/90 | HR 92 | Ht 60.0 in | Wt 271.0 lb

## 2017-10-22 DIAGNOSIS — M7671 Peroneal tendinitis, right leg: Secondary | ICD-10-CM

## 2017-10-22 DIAGNOSIS — M25571 Pain in right ankle and joints of right foot: Principal | ICD-10-CM

## 2017-10-22 DIAGNOSIS — G8929 Other chronic pain: Secondary | ICD-10-CM

## 2017-10-22 MED ORDER — DICLOFENAC SODIUM 2 % TD SOLN: 2.0000 g | Freq: Two times a day (BID) | TRANSDERMAL | 3 refills | Status: DC

## 2017-10-22 MED ORDER — SITAGLIP PHOS-METFORMIN HCL ER 100-1000 MG PO TB24: 1.0000 | ORAL_TABLET | Freq: Every day | ORAL | 0 refills | Status: DC

## 2017-10-22 NOTE — Telephone Encounter (Signed)
Spoke with pt in regards to medication changes. Pt states that she thought the new meds would be in a cheaper tier but were not. She went ahead and picked up the janumet instead of two new meds.   Med list updated

## 2017-10-22 NOTE — Assessment & Plan Note (Signed)
Peroneal tendinitis.  Home exercises given.  Work with Product/process development scientist.  We discussed bracing.  Icing regimen, topical anti-inflammatories and home exercises.  Patient will follow-up again in 4 weeks.  Worsening symptoms we will consider injection.

## 2017-10-22 NOTE — Patient Instructions (Signed)
Good to see you  [name] is your friend. Ice 20 minutes 2 times daily. Usually after activity and before bed. Exercises 3 times a week.  [employer] orthotics "total support" online would be great  Avoid being barefoot even in the house.  In the gym try biking or elliptical but avoid the treadmill  See me again in 4 weeks

## 2017-11-06 ENCOUNTER — Encounter: Payer: Self-pay | Admitting: Family Medicine

## 2017-11-06 DIAGNOSIS — R768 Other specified abnormal immunological findings in serum: Secondary | ICD-10-CM | POA: Diagnosis not present

## 2017-11-07 NOTE — Telephone Encounter (Signed)
Relation to pt: self Call back number: [phone] (H)   Reason for call:  Patient checking on the status of My Chart message, patient would like to speak with Dr. Tamala Julian nurse, please advise

## 2017-11-07 NOTE — Progress Notes (Addendum)
Subjective:   [name] is a 47 y.o. female who presents for an Initial Medicare Annual Wellness Visit.  Review of Systems    No ROS.  Medicare Wellness Visit. Additional risk factors are reflected in the social history.   Cardiac Risk Factors include: advanced age (>56men, >68 women);diabetes mellitus;dyslipidemia;hypertension;obesity (BMI >30kg/m2) Sleep patterns: feels rested on waking, gets up 1-2  times nightly to void and sleeps 6-8 hours nightly.   Home Safety/Smoke Alarms: Feels safe in home. Smoke alarms in place.  Living environment; residence and Firearm Safety: [place], no firearms Lives with husband, no needs for DME, good support system. Seat Belt Safety/Bike Helmet: Wears seat belt.     Objective:    Today's Vitals   11/08/17 0950 11/08/17 0956  BP: 132/78   Pulse: 100   Resp: 18   SpO2: 100%   Weight: 270 lb (122.5 kg)   Height: 5' (1.524 m)   PainSc:  2    Body mass index is 52.73 kg/m.  Advanced Directives 11/08/2017 07/08/2017 05/01/2017 01/30/2017 11/06/2016 08/13/2016 02/05/2016  Does Patient Have a Medical Advance Directive? No No No No No No No  Would patient like information on creating a medical advance directive? Yes (ED - Information included in AVS) - - No - Patient declined No - Patient declined - No - patient declined information    Current Medications (verified) Outpatient Encounter Medications as of 11/08/2017  Medication Sig  . acetaminophen-codeine (TYLENOL #3) 300-30 MG tablet One every 4 hours as needed for cough  . albuterol (PROVENTIL HFA;VENTOLIN HFA) 108 (90 Base) MCG/ACT inhaler Inhale 2 puffs into the lungs every 4 (four) hours as needed for wheezing or shortness of breath.  Marland Kitchen aspirin 81 MG chewable tablet Chew 81 mg by mouth daily.  Marland Kitchen atorvastatin (LIPITOR) 20 MG tablet TAKE ONE TABLET BY MOUTH DAILY  . B COMPLEX VITAMINS SL Place 1 mL under the tongue 4 (four) times a week.  . budesonide-formoterol (SYMBICORT) 80-4.5 MCG/ACT  inhaler Inhale 2 puffs into the lungs 2 (two) times daily.  . calcium carbonate (CALCIUM 600) 600 MG TABS tablet Take 600 mg by mouth daily with breakfast.  . dextromethorphan-guaiFENesin (MUCINEX DM) 30-600 MG 12hr tablet Take 1 tablet by mouth 2 (two) times daily as needed for cough.  . Diclofenac Sodium (PENNSAID) 2 % SOLN Place 2 g onto the skin 2 (two) times daily.  . Dulaglutide (TRULICITY) 1.5 YQ/0.3KV SOPN Inject 1.5 mg into the skin once a week.  . ferrous sulfate 325 (65 FE) MG EC tablet Take 325 mg by mouth daily.  . fluticasone (FLONASE) 50 MCG/ACT nasal spray Place 2 sprays into both nostrils daily as needed for allergies or rhinitis.  Marland Kitchen glucose blood (ONE TOUCH ULTRA TEST) test strip USE TO CHECK BLOOD SUGARS TWICE A DAY  . hydrochlorothiazide (HYDRODIURIL) 12.5 MG tablet Take 12.5 mg by mouth daily as needed (leg swelling).  Marland Kitchen loratadine (CLARITIN) 10 MG tablet Take 10 mg by mouth daily.   Marland Kitchen losartan (COZAAR) 25 MG tablet Take 0.5 tablets (12.5 mg total) by mouth daily.  . mometasone-formoterol (DULERA) 100-5 MCG/ACT AERO Take 2 puffs first thing in am and then another 2 puffs about 12 hours later.  . montelukast (SINGULAIR) 10 MG tablet Take 1 tablet (10 mg total) by mouth at bedtime.  Marland Kitchen oxybutynin (DITROPAN-XL) 10 MG 24 hr tablet Take 10 mg by mouth at bedtime.  . pantoprazole (PROTONIX) 40 MG tablet Take 1 tablet (40 mg total)  by mouth 2 (two) times daily before a meal.  . polyethylene glycol powder (MIRALAX) powder Take 255 g by mouth 2 (two) times daily with a meal.  . predniSONE (DELTASONE) 5 MG tablet Take 5 mg daily by mouth.  . Respiratory Therapy Supplies (FLUTTER) DEVI Use as directed  . SitaGLIPtin-MetFORMIN HCl (JANUMET XR) 607 [id number] 9637 MG TB24 Take 1 tablet by mouth daily.  . Turmeric 500 MG CAPS Take 500 mg by mouth daily.  . [DISCONTINUED] DETROL LA 2 MG 24 hr capsule TAKE 1 CAPSULE BY MOUTH DAILY   No facility-administered encounter medications on file as of  11/08/2017.     Allergies (verified) Penicillins; Peach flavor; and Levaquin [levofloxacin]   History: Past Medical History:  Diagnosis Date  . Allergic rhinitis   . Bronchitis   . Chronic rhinitis   . Cough   . Dyslipidemia   . Dysphagia   . GERD (gastroesophageal reflux disease)   . Headache(784.0)   . ILD (interstitial lung disease) ([place])   . Mild depression ([place])   . Morbid obesity ([place])   . Type II or diabetes mellitus, uncontrolled 11/2012 dx   Dx 12/08/12: a1c 10.0  . Unspecified essential hypertension   . Urge incontinence    Past Surgical History:  Procedure Laterality Date  . ABDOMINAL HYSTERECTOMY    . BILATERAL VATS ABLATION  02/15/04  . CESAREAN SECTION    . LUNG BIOPSY    . RIGHT HEART CATH N/A 01/30/2017   Procedure: Right Heart Cath;  Surgeon: Larey Dresser, MD;  Location: [facility];  Service: Cardiovascular;  Laterality: N/A;   Family History  Problem Relation Age of Onset  . Sarcoidosis Mother        dxed by transbrochial bx  . Allergies Mother   . Heart disease Father   . Diabetes Father   . Allergies Father   . Coronary artery disease Father   . Cancer Maternal Grandmother        CA of unknown type  . Cancer Paternal Grandmother        CA of unknown type   Social History   Socioeconomic History  . Marital status: Married    Spouse name: None  . Number of children: 1  . Years of education: None  . Highest education level: None  Social Needs  . Financial resource strain: Not hard at all  . Food insecurity - worry: Never true  . Food insecurity - inability: Never true  . Transportation needs - medical: No  . Transportation needs - non-medical: No  Occupational History  . 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 Smoking status: Never Smoker  . Smokeless tobacco: Never Used  . Tobacco comment: {  Substance and Sexual Activity  . Alcohol use: No    Alcohol/week:  0.0 oz  . Drug use: No  . Sexual activity: No  Other Topics Concern  . None  Social History Narrative  . None    Tobacco Counseling Counseling given: Not Answered Comment: {  Activities of Daily Living In your present state of health, do you have any difficulty performing the following activities: 11/08/2017 01/30/2017  Hearing? N N  Vision? N N  Difficulty concentrating or making decisions? N N  Walking or climbing stairs? N N  Dressing or bathing? N N  Doing errands, shopping? N -  Preparing Food and eating ? N -  Using the Toilet? N -  In the past six months, have you accidently leaked urine? N -  Do you have problems with loss of bowel control? N -  Managing your Medications? N -  Managing your Finances? N -  Housekeeping or managing your Housekeeping? N -  Some recent data might be hidden     Immunizations and Health Maintenance Immunization History  Administered Date(s) Administered  . Influenza Split 06/21/2011, 07/29/2012  . Influenza Whole 07/22/2009  . Influenza,inj,Quad PF,6+ Mos 06/15/2013, 08/19/2014, 09/19/2015, 08/31/2016, 08/09/2017  . Pneumococcal Conjugate-13 09/19/2015  . Pneumococcal Polysaccharide-23 07/22/2009  . Td 03/24/2009   Health Maintenance Due  Topic Date Due  . PNEUMOCOCCAL POLYSACCHARIDE VACCINE (2) 07/22/2014    Patient Care Team: Binnie Rail, MD as PCP - General (Internal Medicine) Tanda Rockers, MD (Pulmonary Disease)  Indicate any recent Medical Services you may have received from other than [facility] providers in the past year (date may be approximate).     Assessment:   This is a routine wellness examination for [place]. Physical assessment deferred to PCP.  Hearing/Vision screen Hearing Screening Comments: Able to hear conversational tones w/o difficulty. No issues reported.  Passed whisper test Vision Screening Comments: appointment yearly, [facility]  Dietary issues and exercise activities discussed: Current Exercise  Habits: Home exercise routine, Type of exercise: walking;calisthenics(stationary bike), Time (Minutes): 40, Frequency (Times/Week): 3, Weekly Exercise (Minutes/Week): 120, Intensity: Mild, Exercise limited by: None identified  Diet (meal preparation, eat out, water intake, caffeinated beverages, dairy products, fruits and vegetables): in general, a "healthy" diet  , well balanced, eats a variety of fruits and vegetables daily, limits salt, fat/cholesterol, sugar, caffeine, drinks 6-8 glasses of water daily. Reports monitoring carbohydrates and sugar, patient reports she is working towards losing weight. Weight loss strategies discussed, Diet education was provided via handout.    Goals    . Patient Stated     Increase the amount of exercise by trying to get to the gym by 6:00 AM routinely 3 days a week. Staying active at home doing spurts of exercises.       Cognitive Function:       Ad8 score reviewed for issues:  Issues making decisions: no  Less interest in hobbies / activities: no  Repeats questions, stories (family complaining): no  Trouble using ordinary gadgets (microwave, computer, phone):no  Forgets the month or year: no  Mismanaging finances: no  Remembering appts: no  Daily problems with thinking and/or memory: no Ad8 score is= 0  Screening Tests Health Maintenance  Topic Date Due  . PNEUMOCOCCAL POLYSACCHARIDE VACCINE (2) 07/22/2014  . HEMOGLOBIN A1C  02/06/2018  . FOOT EXAM  05/09/2018  . OPHTHALMOLOGY EXAM  10/01/2018  . TETANUS/TDAP  03/25/2019  . INFLUENZA VACCINE  Completed  . HIV Screening  Completed  . PAP SMEAR  Discontinued     Plan:     Nurse will notify PCP of medication requests to assist patient during her upcoming trip to [place].   Continue doing brain stimulating activities (puzzles, reading, adult coloring books, staying active) to keep memory sharp.   Continue to eat heart healthy diet (full of fruits, vegetables, whole grains, lean  protein, water--limit salt, fat, and sugar intake) and increase physical activity as tolerated.  Patient will contact eye doctor to ask them to send eye exam results to Dr. Quay Burow and will discuss getting the pneumonia vaccine with Dr. Melvyn Novas.  I have personally reviewed and noted the following in the patient's chart:   .  Medical and social history . Use of alcohol, tobacco or illicit drugs  . Current medications and supplements . Functional ability and status . Nutritional status . Physical activity . Advanced directives . List of other physicians . Vitals . Screenings to include cognitive, depression, and falls . Referrals and appointments  In addition, I have reviewed and discussed with patient certain preventive protocols, quality metrics, and best practice recommendations. A written personalized care plan for preventive services as well as general preventive health recommendations were provided to patient.     Michiel Cowboy, RN   11/08/2017    Medical screening examination/treatment/procedure(s) were performed by non-physician practitioner and as supervising physician I was immediately available for consultation/collaboration. I agree with above. Binnie Rail, MD

## 2017-11-08 ENCOUNTER — Telehealth: Payer: Self-pay | Admitting: *Deleted

## 2017-11-08 ENCOUNTER — Ambulatory Visit (INDEPENDENT_AMBULATORY_CARE_PROVIDER_SITE_OTHER): Payer: Medicare Other | Admitting: *Deleted

## 2017-11-08 VITALS — BP 132/78 | HR 100 | Resp 18 | Ht 60.0 in | Wt 270.0 lb

## 2017-11-08 DIAGNOSIS — Z0000 Encounter for general adult medical examination without abnormal findings: Secondary | ICD-10-CM | POA: Diagnosis not present

## 2017-11-08 NOTE — Patient Instructions (Addendum)
Continue doing brain stimulating activities (puzzles, reading, adult coloring books, staying active) to keep memory sharp.   Continue to eat heart healthy diet (full of fruits, vegetables, whole grains, lean protein, water--limit salt, fat, and sugar intake) and increase physical activity as tolerated.  Please contact eye doctor to ask them to send eye exam results to Dr. Quay Burow and discuss getting the pneumonia vaccine with Dr. Melvyn Novas.  [name] , Thank you for taking time to come for your Medicare Wellness Visit. I appreciate your ongoing commitment to your health goals. Please review the following plan we discussed and let me know if I can assist you in the future.   These are the goals we discussed: Goals    . Patient Stated     Increase the amount of exercise by trying to get to the gym by 6:00 AM routinely 3 days a week. Staying active at home doing spurts of exercises.       This is a list of the screening recommended for you and due dates:  Health Maintenance  Topic Date Due  . Pap Smear  03/24/2012  . Pneumococcal vaccine (2) 07/22/2014  . Eye exam for diabetics  08/19/2016  . Hemoglobin A1C  02/06/2018  . Complete foot exam   05/09/2018  . Tetanus Vaccine  03/25/2019  . Flu Shot  Completed  . HIV Screening  Completed

## 2017-11-08 NOTE — Telephone Encounter (Signed)
During AWV, patient stated that she will travel to [place] 12/01/17. Due to her chronic illnesses, she would like to have an antibiotic prescribed prophylatically to help her if she should become sick during her travel. She also states that during her travel she will be riding on a cable car. She is extremely afraid of heights and requested for PCP to prescribe some sort of sedative to help. Patient states that she would need 2-3 pills.

## 2017-11-10 MED ORDER — ALPRAZOLAM 0.25 MG PO TABS: 0.2500 mg | ORAL_TABLET | Freq: Two times a day (BID) | ORAL | 0 refills | Status: DC | PRN

## 2017-11-10 MED ORDER — AZITHROMYCIN 250 MG PO TABS: ORAL_TABLET | ORAL | 0 refills | Status: DC

## 2017-11-10 NOTE — Telephone Encounter (Signed)
I sent a zpak to her pharmacy and xanax to be taken only as needed.

## 2017-11-11 ENCOUNTER — Other Ambulatory Visit: Payer: Self-pay | Admitting: Physician Assistant

## 2017-11-11 DIAGNOSIS — Z7952 Long term (current) use of systemic steroids: Secondary | ICD-10-CM

## 2017-11-11 NOTE — Progress Notes (Signed)
[facility] [address] [place], [place] [address] Phone: [phone] Subjective:    I'm seeing this patient by the request  of:    CC: Ankle pain follow-up  [id number]  Mekayla H [name] is a 47 y.o. female coming in with complaint of ankle pain.  Found to have a peroneal tendinitis.  Patient was doing relatively well with casting, home exercises and icing regimen.  Patient recently saw a rheumatologist.  There is concerned with patient continued to have swelling of the ankle noted.  Patient states that her ankle is somewhat better. Rheumatologist would like for patient to get injection and see how long it lasts in order to provide her with a differential diagnosis. Patient still has constant pain with walking but her pain has improved. She has been doing her exercises.       Past Medical History:  Diagnosis Date  . Allergic rhinitis   . Bronchitis   . Chronic rhinitis   . Cough   . Dyslipidemia   . Dysphagia   . GERD (gastroesophageal reflux disease)   . Headache(784.0)   . ILD (interstitial lung disease) ([place])   . Mild depression ([place])   . Morbid obesity ([place])   . Type II or diabetes mellitus, uncontrolled 11/2012 dx   Dx 12/08/12: a1c 10.0  . Unspecified essential hypertension   . Urge incontinence    Past Surgical History:  Procedure Laterality Date  . ABDOMINAL HYSTERECTOMY    . BILATERAL VATS ABLATION  02/15/04  . CESAREAN SECTION    . LUNG BIOPSY    . RIGHT HEART CATH N/A 01/30/2017   Procedure: Right Heart Cath;  Surgeon: Larey Dresser, MD;  Location: [facility];  Service: Cardiovascular;  Laterality: N/A;   Social History   Socioeconomic History  . Marital status: Married    Spouse name: Not on file  . Number of children: 1  . Years of education: Not on file  . Highest education level: Not on file  Social Needs  . Financial resource strain: Not hard at all  . Food insecurity - worry: Never true  . Food insecurity -  inability: Never true  . Transportation needs - medical: No  . Transportation needs - non-medical: No  Occupational History  . 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 Smoking status: Never Smoker  . Smokeless tobacco: Never Used  . Tobacco comment: {  Substance and Sexual Activity  . Alcohol use: No    Alcohol/week: 0.0 oz  . Drug use: No  . Sexual activity: No  Other Topics Concern  . Not on file  Social History Narrative  . Not on file   Allergies  Allergen Reactions  . Penicillins Shortness Of Breath, Swelling and Other (See Comments)    Has patient had a PCN reaction causing immediate rash, facial/tongue/throat swelling, SOB or lightheadedness with hypotension: yes Has patient had a PCN reaction causing severe rash involving mucus membranes or skin necrosis: no Has patient had a PCN reaction that required hospitalization no Has patient had a PCN reaction occurring within the last 10 years: no If all of the above answers are "NO", then may proceed with Cephalosporin use.   Marland Kitchen Peach Flavor Hives and Other (See Comments)    Fresh peaches only  . Levaquin [Levofloxacin] Nausea Only    Nausea, bloating   Family History  Problem Relation Age of Onset  . Sarcoidosis Mother        dxed by transbrochial bx  . Allergies Mother   . Heart disease Father   . Diabetes Father   . Allergies Father   . Coronary artery disease Father   . Cancer Maternal Grandmother        CA of unknown type  . Cancer Paternal Grandmother        CA of unknown type     Past medical history, social, surgical and family history all reviewed in electronic medical record.  No pertanent information unless stated regarding to the chief complaint.   Review of Systems:Review of systems updated and as accurate as of 11/11/17  No headache, visual changes, nausea, vomiting, diarrhea, constipation, dizziness, abdominal pain, skin rash,  fevers, chills, night sweats, weight loss, swollen lymph nodes, body aches, joint swelling, muscle aches, chest pain, shortness of breath, mood changes.   Objective  There were no vitals taken for this visit. Systems examined below as of 11/11/17   General: No apparent distress alert and oriented x3 mood and affect normal, dressed appropriately.  HEENT: Pupils equal, extraocular movements intact  Respiratory: Patient's speak in full sentences and does not appear short of breath  Cardiovascular: No lower extremity edema, non tender, no erythema  Skin: Warm dry intact with no signs of infection or rash on extremities or on axial skeleton.  Abdomen: Soft nontender  Neuro: Cranial nerves II through XII are intact, neurovascularly intact in all extremities with 2+ DTRs and 2+ pulses.  Lymph: No lymphadenopathy of posterior or anterior cervical chain or axillae bilaterally.  Gait normal with good balance and coordination.  MSK:  Mild tender with full range of motion and good stability and symmetric strength and tone of shoulders, elbows, wrist, hip, knee bilaterally.  Right ankle still shows significant tenderness to palpation over the peroneal tendons.  Patient does have swelling in the area as well.  Patient does have some mild limitation in range of motion with dorsi flexion as well as supination secondary to pain.  Neurovascularly intact distally.  After verbal consent patient was prepped with alcohol swab and with a 25-gauge half inch needle injected the peroneal tendon sheath with 1 cc of 0.5% Marcaine and 1 cc of Kenalog 40 mg/mL.  No blood loss.  Postinjection instructions given.    Impression and Recommendations:     This case required medical decision making of moderate complexity.      Note: This dictation was prepared with Dragon dictation along with smaller phrase technology. Any transcriptional errors that result from this process are unintentional.

## 2017-11-12 ENCOUNTER — Ambulatory Visit: Payer: Medicare Other | Admitting: Family Medicine

## 2017-11-12 ENCOUNTER — Encounter: Payer: Self-pay | Admitting: Family Medicine

## 2017-11-12 DIAGNOSIS — M7671 Peroneal tendinitis, right leg: Secondary | ICD-10-CM

## 2017-11-12 NOTE — Patient Instructions (Signed)
Good to see you  Air cast with walking Running socks could be good for the airplane.  Continue the exercises and the topical  See me again in when you return from your trip

## 2017-11-12 NOTE — Assessment & Plan Note (Signed)
Peroneal tendon injected today.  Tolerated the procedure well.  Aircast given for the trip.  Discussed icing regimen and topical anti-inflammatories.  Worsening symptoms we will consider more aggressive therapy but I think patient will do well.  Follow-up after her trip to [place].

## 2017-11-13 NOTE — Telephone Encounter (Signed)
LVM to inform patient that the discussed prescriptions were sent to her pharmacy. Requested that the patient call nurse back for any further questions or concerns.

## 2017-11-16 ENCOUNTER — Encounter: Payer: Self-pay | Admitting: Family Medicine

## 2017-11-19 ENCOUNTER — Ambulatory Visit: Payer: Medicare Other | Admitting: Family Medicine

## 2017-11-26 ENCOUNTER — Ambulatory Visit: Payer: Medicare Other | Admitting: Internal Medicine

## 2017-11-26 ENCOUNTER — Encounter: Payer: Self-pay | Admitting: Internal Medicine

## 2017-11-26 VITALS — BP 132/90 | HR 83 | Ht 60.0 in | Wt 267.0 lb

## 2017-11-26 DIAGNOSIS — J45991 Cough variant asthma: Secondary | ICD-10-CM

## 2017-11-26 DIAGNOSIS — J9611 Chronic respiratory failure with hypoxia: Secondary | ICD-10-CM | POA: Diagnosis not present

## 2017-11-26 DIAGNOSIS — J8410 Pulmonary fibrosis, unspecified: Secondary | ICD-10-CM | POA: Diagnosis not present

## 2017-11-26 MED ORDER — BUDESONIDE-FORMOTEROL FUMARATE 80-4.5 MCG/ACT IN AERO: 2.0000 | INHALATION_SPRAY | Freq: Two times a day (BID) | RESPIRATORY_TRACT | 0 refills | Status: DC

## 2017-11-26 NOTE — Assessment & Plan Note (Signed)
-   PFTs.  03/09/2015  14% better p B2 and less cough p saba - 03/09/2015   > try dulera 100 2bid  - 08/31/2016   try increase to 200 2bid due to cough > no better so back to 100 2bid maint  -  11/26/2017  After extensive coaching inhaler device  effectiveness =  75% ( short Ti )    Since she is having trouble affording symbicort/dulera ok to change to 1-2 q 12h   Based on the study from NEJM  378; 20 p 1865 (2018) in pts with mild asthma it is reasonable to use low dose symbicort eg 80 2bid "prn" flare in this setting but I emphasized this was only shown with symbicort and takes advantage of the rapid onset of action but is not the same as "rescue therapy" but can be stopped once the acute symptoms have resolved and the need for rescue has been minimized (< 2 x weekly)    I had an extended discussion with the patient reviewing all relevant studies completed to date and  lasting 15 to 20 minutes of a 25 minute visit    Each maintenance medication was reviewed in detail including most importantly the difference between maintenance and prns and under what circumstances the prns are to be triggered using an action plan format that is not reflected in the computer generated alphabetically organized AVS but trather by a customized med calendar that reflects the AVS meds with confirmed 100% correlation.   In addition, Please see AVS for unique instructions that I personally wrote and verbalized to the the pt in detail and then reviewed with pt  by my nurse highlighting any  changes in therapy recommended at today's visit to their plan of care.

## 2017-11-26 NOTE — Assessment & Plan Note (Signed)
rx = 2lpm hs and prn with activity up to 3lpm - ok at rest

## 2017-11-26 NOTE — Patient Instructions (Addendum)
[place] when return from [place] to reduce symbicort 80 to 1-2 every 12 hours   If worsen, double the prednsione dose until better then back to 5 mg daily    Work on inhaler technique:  relax and gently blow all the way out then take a nice smooth deep breath back in, triggering the inhaler at same time you start breathing in.  Hold for up to 5 seconds if you can. Blow out thru nose. Rinse and gargle with water when done     See calendar for specific medication instructions and bring it back for each and every office visit for every healthcare provider you see.  Without it,  you may not receive the best quality medical care that we feel you deserve.  You will note that the calendar groups together  your maintenance  medications that are timed at particular times of the day.  Think of this as your checklist for what your doctor has instructed you to do until your next evaluation to see what benefit  there is  to staying on a consistent group of medications intended to keep you well.  The other group at the bottom is entirely up to you to use as you see fit  for specific symptoms that may arise between visits that require you to treat them on an as needed basis.  Think of this as your action plan or "what if" list.   Separating the top medications from the bottom group is fundamental to providing you adequate care going forward.     Please schedule a follow up visit in 3 months but call sooner if needed

## 2017-11-26 NOTE — Progress Notes (Signed)
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  Since then waxing/waning sx requiring intermittent prednisone tapers with only minimal improvement - most of her symptoms chronically have been related to obesity with dyspnea on exertion and tendency to chronic cough which is felt to be at least partly  reflux related but improved with saba 03/09/2015 so started on dulera 100 2bid in addition to maint pred and seems to have helped but not typically  able to get < 10 mg pred s flare cough/sob     History of Present Illness  November 07, 2010 ov cc cough/ strangling  a bit more with taper off reglan (actually worse before ran out of the low dose reglan)  no excess mucus or increase sob on prednisone @  10 mg daily .  Using med calendar well rec 1)  Ok to resume water aerobics but pace yourself 2)  Wait until your swallowing evaulation is complete before making a decision re reglan > stopped it on her own.  3)  Try Prednisone 10 mg one-half each am   04/04/2012 f/u ov/ no longer using med calendar on Pred 10 mg one daily  added hyzaar to rx for hbp/leg swelling not back to mall walking yet,  No unusual cough, purulent sputum or sinus/hb symptoms on present rx. No variability to symptoms or need for inhaler. Rec Try prednisone 10 mg one half daily to see if affects your ability to walk the mall on 02 or the cough gets worse (walk the mall first for at least before you try) Always walk for exercise on 3lpm - this will help you burn fat and get into negative calorie balance to lose weight.   03/17/2013 f/u ov/ re NSIP on floor of 10 mg per day Chief Complaint  Patient presents with  . Follow-up    Breathing is overall doing well and she denies any new co's today.   ex on a treadmill x 56m x 3 x daily on 2lpm not the 3lpm as rec >>Prednisone  ceiling is 20 mg per day and floor is 10 mg per day        10/06/2014 f/u ov/ re: pred 10 mg daily after trying 10/5 until Tgiving and worse cough/ sob  Chief Complaint  Patient presents with  . Follow-up    F/U ILD; breathing doing fine; no complaints  Not doing treadmill  Any more but Not limited by breathing from desired activities   rec Resume treadmill x 30 min daily minimum If  doing great try to reduce prednisone to 10 mg alternating with 5 mg daily as your new floor but increase back to ceiling of 20 mg per day if needed     03/09/2015 f/u ov/ re: NSIP  pred 10 mg daily could not tol 10/5  Chief Complaint  Patient presents with  . Follow-up    PFT done today. Pt states her breathing is overall doing well, unless she gets exposed to hot/humid weather. She is taking 10 mg pred daily.   2lpm sleep/ 3 lpm ex , flare of cough with < 10 mg per day pred, better cough p albuterol  rec Work on inhaler technique:    Try dulera 100 Take 2 puffs first thing in am and then another  2 puffs about 12 hours later> helped some        02/06/2016  f/u ov/ re: NSIP / 3lpm with ex/ duler 100 2bid and pred 10 mg daily  Chief Complaint  Patient presents with  . Follow-up    Pt states that she did have some issues with allergies but is improving. Pt also was having issues with Dulera, jittery after use, but has started rinsing her mouth after use and side effects have improved. Pt denies cough/wheeze/SOB/CP/tightness.   limited by L knee from doing treadmill, has trainer but not losing wt  Rhinitis symptoms better on singulair   rec Work on inhaler technique:    Should be able to tolerate dulera 100 Take 2 puffs first thing in am and then another 2 puffs about 12 hours later.  If so, try prednisone 10 - 5  = even days take a half, odd days take half   06/01/2016  f/u ov/ re: NSIP  Chief Complaint  Patient presents with  . Follow-up    Breathing is doing well. She denies any new  co's today.   when ex 3lpm and back on treadmill x 25 min more limited by knee 2-3x per week at 68mph flat / minimal dry daytime cough on nexium 20 mg bid ac  rec Work on inhaler technique:   Continue  dulera 100 Take 2 puffs first thing in am and then another 2 puffs about 12 hours later.  try prednisone 10 - 5  = even days take a half, odd days take half   08/31/2016  f/u ov/ re:  NSIP ? Cough variant asthma component maint on dulera 100 2bid  Chief Complaint  Patient presents with  . Follow-up    Increased cough x 2 wks  on treadmill x 3lpm x 25 min  Decreased pred Nov 13th and cough worse starting on 23rd  Cough worse at hs and early in am but non-productive  Nose feels dried out/ clogged up/ saline helps  rec Stay on singulair each evening Try dulera 200 Take 2 puffs first thing in am and then another 2 puffs about 12 hours later to see if helps or not by the time you return  Protonix 40 mg Take 30- 60 min before your first and last meals of the day  Prednisone 10 mg daily until better then 10 alternating with 5 mg daily      NP ov 10/05/16 rec Follow med calendar closely and bring to each visit  Return to Prednisone 10mg  daily  Labs today .  Set up for HRCT Chest    07/12/2017 acute extended ov/ re: cough and "wheeze'  Chief Complaint  Patient presents with  . Acute Visit    went to UC on 07/08/17 with cough and wheezing. She states dxed with URI. She is still coughing with minimal green sputum and wheezing some.   prednisone from 10 mg to 5 mg over months and on 5mg  all summer ok  On dulera 100 Take 2 puffs first thing in am and then another 2 puffs about 12 hours later but inhaler is on zero,  Not using rescue correctly and did not bring it or med calendar as req   Acutely worse 07/04/17 with cough/ wheeze/ nasal congestion and reqported to UC she did not use saba rec 07/08/17 UC: Start using the albuterol HFA 2 puffs every 4 hours for cough and wheeze. Continue  using your other inhalers. Start taking the prednisone 50  mg x 6 days  Take with food. For drainage recommend taking Allegra or Zyrtec daily for the next few days. For nasal congestion Sudafed PE 10 mg every 4-6 hours. rec  For cough > mucinex dm 1200 mg every 12 hours and use use the flutter valve as needed but if still can't stop coughing then add  Tylenol #3 one every 4 hours as needed Continue prednisone 40 x 2 , 30 x 2, 10 x 2 days, and 5 mg daily THEREAFTER ZPAK    08/26/18 NP / med calendar    11/26/2017  f/u ov/ re:   NSIP/ cough variant asthma/MO / 02 2lpm at bedtime/ 3lpm ex  Doing better with med calendar/action plan Chief Complaint  Patient presents with  . Follow-up    Breathing is doing well. She has only used rescue inhaler x 2 since the last visit.   Dyspnea:  Recumbent bicycle x 20 min / walk 2.6 mph flat on 3lpm low 90's on pred 5 mg  Cough: no need for any cough suppression Sleep: at 30 degrees on 2lpm/ no am ha ra SABA use:  Rarely need any saba   No obvious day to day or daytime variability or assoc excess/ purulent sputum or mucus plugs or hemoptysis or cp or chest tightness, subjective wheeze or overt sinus or hb symptoms. No unusual exposure hx or h/o childhood pna/ asthma or knowledge of premature birth.  Sleeping ok flat without nocturnal  or early am exacerbation  of respiratory  c/o's or need for noct saba. Also denies any obvious fluctuation of symptoms with weather or environmental changes or other aggravating or alleviating factors except as outlined above   Current Allergies, Complete Past Medical History, Past Surgical History, Family History, and Social History were reviewed in [employer] record.  ROS  The following are not active complaints unless bolded Hoarseness, sore throat, dysphagia, dental problems, itching, sneezing,  nasal congestion or discharge of excess mucus or purulent secretions, ear ache,   fever, chills,  sweats, unintended wt loss or wt gain, classically pleuritic or exertional cp,  orthopnea pnd or leg swelling, presyncope, palpitations, abdominal pain, anorexia, nausea, vomiting, diarrhea  or change in bowel habits or change in bladder habits, change in stools or change in urine, dysuria, hematuria,  rash, arthralgias, visual complaints, headache, numbness, weakness or ataxia or problems with walking or coordination,  change in mood/affect or memory.        Current Meds  Medication Sig  . acetaminophen-codeine (TYLENOL #3) 300-30 MG tablet One every 4 hours as needed for cough  . albuterol (PROVENTIL HFA;VENTOLIN HFA) 108 (90 Base) MCG/ACT inhaler Inhale 2 puffs into the lungs every 4 (four) hours as needed for wheezing or shortness of breath.  . ALPRAZolam (XANAX) 0.25 MG tablet Take 1 tablet (0.25 mg total) by mouth 2 (two) times daily as needed for anxiety.  Marland Kitchen aspirin 81 MG chewable tablet Chew 81 mg by mouth daily.  Marland Kitchen atorvastatin (LIPITOR) 20 MG tablet TAKE ONE TABLET BY MOUTH DAILY  . B COMPLEX VITAMINS SL Place 1 mL under the tongue 4 (four) times a week.  . budesonide-formoterol (SYMBICORT) 80-4.5 MCG/ACT inhaler Inhale 2 puffs into the lungs 2 (two) times daily.  . calcium carbonate (CALCIUM 600) 600 MG TABS tablet Take 600 mg by mouth daily with breakfast.  . dextromethorphan-guaiFENesin (MUCINEX DM) 30-600 MG 12hr tablet Take 1 tablet by mouth 2 (two) times daily as needed for cough.  . Diclofenac  Sodium (PENNSAID) 2 % SOLN Place 2 g onto the skin 2 (two) times daily.  . Dulaglutide (TRULICITY) 1.5 VQ/2.5ZD SOPN Inject 1.5 mg into the skin once a week.  . ferrous sulfate 325 (65 FE) MG EC tablet Take 325 mg by mouth daily.  . fluticasone (FLONASE) 50 MCG/ACT nasal spray Place 2 sprays into both nostrils daily as needed for allergies or rhinitis.  Marland Kitchen glucose blood (ONE TOUCH ULTRA TEST) test strip USE TO CHECK BLOOD SUGARS TWICE A DAY  . hydrochlorothiazide (HYDRODIURIL) 12.5 MG tablet  Take 12.5 mg by mouth daily as needed (leg swelling).  Marland Kitchen loratadine (CLARITIN) 10 MG tablet Take 10 mg by mouth daily.   Marland Kitchen losartan (COZAAR) 25 MG tablet Take 0.5 tablets (12.5 mg total) by mouth daily.  . montelukast (SINGULAIR) 10 MG tablet Take 1 tablet (10 mg total) by mouth at bedtime.  Marland Kitchen oxybutynin (DITROPAN-XL) 10 MG 24 hr tablet Take 10 mg by mouth at bedtime.  . pantoprazole (PROTONIX) 40 MG tablet Take 1 tablet (40 mg total) by mouth 2 (two) times daily before a meal.  . polyethylene glycol powder (MIRALAX) powder Take 255 g by mouth 2 (two) times daily with a meal.  . predniSONE (DELTASONE) 5 MG tablet Take 5 mg daily by mouth.  . Respiratory Therapy Supplies (FLUTTER) DEVI Use as directed  . SitaGLIPtin-MetFORMIN HCl (JANUMET XR) [id number] MG TB24 Take 1 tablet by mouth daily.  . Turmeric 500 MG CAPS Take 500 mg by mouth daily.  .                      Past Medical History: GASTROESOPHAGEAL REFLUX DISEASE     - Tapered reglan to 10 mg one half twice daily June 22, 2010 > d/c'd 11/2010  -restarted reglan 03/2011 >>unable to tolerate.  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)   -Restart Prednisone 02/24/08   -PFT's December 23, 2008 VC 35%  DLC0 31%   -Desats with > 2 laps 06/22/10 so rec wear 02 2lpm at bedtime and with ex  MORBID OBESITY   - Target wt  =  153  for BMI < 30  Hypertension Health Maintenance..........................................................Marland KitchenMarland KitchenAsa Lente    - Pneumovax July 22, 2009      - Flu 06/21/2011  Complex med regimen 06/21/2011 , 06/15/2013         Objective:   Physical Exam   massively obese bf cough with deep insp   Vital signs reviewed - Note on arrival 02 sats  95% on RA     wt  305 May 28, 2008 >   302 November 07, 2010 >   303 03/22/2011 > 10/25/2011  299 > 311 04/04/2012 > 306 03/17/2013 >06/15/2013 303 > 10/06/2013 298  > 02/08/2014 289 >  10/06/2014   288 > 03/09/2015 283 > 10/21/2015    283 >  02/06/2016  284 > 06/01/2016  282 > 08/31/2016   281 > 07/12/2017   270 > 11/26/2017   267    HEENT: nl dentition, turbinates bilaterally, and oropharynx. Nl external ear canals without cough reflex   NECK :  without JVD/Nodes/TM/ nl carotid upstrokes bilaterally   LUNGS: no acc muscle use,  Nl contour chest which is clear to A and P bilaterally without cough on insp or exp maneuvers   CV:  RRR  no s3 or murmur or increase in P2, and no edema   ABD:  soft and nontender with  nl inspiratory excursion in the supine position. No bruits or organomegaly appreciated, bowel sounds nl  MS:  Nl gait/ ext warm without deformities, calf tenderness, cyanosis or clubbing No obvious joint restrictions   SKIN: warm and dry without lesions    NEURO:  alert, approp, nl sensorium with  no motor or cerebellar deficits apparent.            Assessment & Plan:

## 2017-11-26 NOTE — Assessment & Plan Note (Signed)
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)   -Restarted Prednisone 02/24/08   -PFT's December 23, 2008 VC 35%  DLC0 31%   -PFT's 02/08/2014   VC 1.30 (49%) and DLCO is 70%  > rec pred 10/5 > did not tolerate - PFTs  03/09/2015     VC 1.27 (48%) and dlco is 72% @ 10 mg daily  - 02/06/2016 try 10 a/w 5 mg daily > did not try - 06/01/2016 try 10 a/w 5 daily > cough  flared p 1 week on lower dose  -ANA positive 10/2016 >refer to rheumatology  -HRCT chest essentially stable changes since 2005 10/09/2016 >  Rheum f/u     The goal with a chronic steroid dependent illness is always arriving at the lowest effective dose that controls the disease/symptoms and not accepting a set "formula" which is based on statistics or guidelines that don't always take into account patient  variability or the natural hx of the dz in every individual patient, which may well vary over time.  For now therefore I recommend the patient maintain  10 mg ceiling and 5 mg floor

## 2017-11-26 NOTE — Assessment & Plan Note (Signed)
Body mass index is 52.14 kg/m.  -  trending down/ encouraged Lab Results  Component Value Date   TSH 1.25 05/09/2017     Contributing to gerd risk/ doe/reviewed the need and the process to achieve and maintain neg calorie balance > defer f/u primary care including intermittently monitoring thyroid status

## 2017-12-13 ENCOUNTER — Other Ambulatory Visit: Payer: Self-pay | Admitting: Internal Medicine

## 2017-12-28 ENCOUNTER — Other Ambulatory Visit: Payer: Self-pay | Admitting: Internal Medicine

## 2018-02-05 DIAGNOSIS — F419 Anxiety disorder, unspecified: Secondary | ICD-10-CM | POA: Insufficient documentation

## 2018-02-05 NOTE — Assessment & Plan Note (Addendum)
Taking xanax as needed Will ocntinue

## 2018-02-05 NOTE — Progress Notes (Signed)
Subjective:    Patient ID: [name], female    DOB: 19-Dec-1970, 47 y.o.   MRN: [id number]  HPI The patient is here for follow up.  Left hip pain:  She has bursitis and has had a lot of pain.  She had an injection recently and it is helping a little.  She is still having pain.    Diabetes: She is taking her medication daily as prescribed. She is fairly compliant with a diabetic diet. She is not exercising regularly. She monitors her sugars and they have been running high and variable.  She has had a few steroid injections.  She gets 170's in the morning.  It has been over 200 the past two mornings due to her hip injection.    Hypertension: She is taking her medication daily. She is compliant with a low sodium diet.  She denies chest pain, palpitations, shortness of breath and regular headaches. She is not exercising regularly.  She does not monitor her blood pressure at home.    Hyperlipidemia: She is taking her medication daily. She is compliant with a low fat/cholesterol diet. She is not exercising regularly. She denies myalgias.   Anxiety: She is taking her medication daily as prescribed. She denies any side effects from the medication. She feels her anxiety is well controlled and she is happy with her current dose of medication.      Medications and allergies reviewed with patient and updated if appropriate.  Patient Active Problem List   Diagnosis Date Noted  . Anxiety 02/05/2018  . Peroneal tendinitis of lower leg, right 10/22/2017  . Chronic respiratory failure with hypoxia ([place]) 07/20/2017  . Knee pain, left 02/02/2016  . Cough variant asthma 03/09/2015  . Diabetes type 2, uncontrolled ([place]) 12/09/2012  . Dyslipidemia   . Acute on chronic respiratory failure with hypoxia ([place]) 04/05/2012  . CONJUNCTIVITIS, ALLERGIC, SEASONAL 01/05/2010  . Allergic rhinitis 01/05/2010  . Essential hypertension 09/07/2009  . HEADACHE 03/24/2009  . URINARY INCONTINENCE, URGE 03/24/2009    . CHRONIC RHINITIS 12/23/2008  . Morbid obesity due to excess calories ([place]) 10/10/2007  . INTERSTITIAL LUNG DISEASE 10/10/2007  . GASTROESOPHAGEAL REFLUX DISEASE 10/10/2007    Current Outpatient Medications on File Prior to Visit  Medication Sig Dispense Refill  . acetaminophen-codeine (TYLENOL #3) 300-30 MG tablet One every 4 hours as needed for cough 40 tablet 0  . albuterol (PROVENTIL HFA;VENTOLIN HFA) 108 (90 Base) MCG/ACT inhaler Inhale 2 puffs into the lungs every 4 (four) hours as needed for wheezing or shortness of breath. 1 Inhaler 0  . ALPRAZolam (XANAX) 0.25 MG tablet Take 1 tablet (0.25 mg total) by mouth 2 (two) times daily as needed for anxiety. 3 tablet 0  . aspirin 81 MG chewable tablet Chew 81 mg by mouth daily.    Marland Kitchen atorvastatin (LIPITOR) 20 MG tablet TAKE ONE TABLET BY MOUTH DAILY 90 tablet 0  . B COMPLEX VITAMINS SL Place 1 mL under the tongue 4 (four) times a week.    . budesonide-formoterol (SYMBICORT) 80-4.5 MCG/ACT inhaler Inhale 2 puffs into the lungs 2 (two) times daily. 1 Inhaler 0  . calcium carbonate (CALCIUM 600) 600 MG TABS tablet Take 600 mg by mouth daily with breakfast.    . Diclofenac Sodium (PENNSAID) 2 % SOLN Place 2 g onto the skin 2 (two) times daily. 112 g 3  . ferrous sulfate 325 (65 FE) MG EC tablet Take 325 mg by mouth daily.    Marland Kitchen  fluticasone (FLONASE) 50 MCG/ACT nasal spray Place 2 sprays into both nostrils daily as needed for allergies or rhinitis.    Marland Kitchen glucose blood (ONE TOUCH ULTRA TEST) test strip USE TO CHECK BLOOD SUGARS TWICE A DAY 100 each 1  . hydrochlorothiazide (HYDRODIURIL) 12.5 MG tablet Take 12.5 mg by mouth daily as needed (leg swelling).    Marland Kitchen loratadine (CLARITIN) 10 MG tablet Take 10 mg by mouth daily.     Marland Kitchen losartan (COZAAR) 25 MG tablet Take 0.5 tablets (12.5 mg total) by mouth daily. 45 tablet 3  . mometasone-formoterol (DULERA) 100-5 MCG/ACT AERO Take 2 puffs first thing in am and then another 2 puffs about 12 hours later. 1  Inhaler 11  . montelukast (SINGULAIR) 10 MG tablet Take 1 tablet (10 mg total) by mouth at bedtime. 30 tablet 5  . oxybutynin (DITROPAN-XL) 10 MG 24 hr tablet Take 10 mg by mouth at bedtime.    . pantoprazole (PROTONIX) 40 MG tablet Take 1 tablet (40 mg total) by mouth 2 (two) times daily before a meal. 60 tablet 11  . polyethylene glycol powder (MIRALAX) powder Take 255 g by mouth 2 (two) times daily with a meal. 255 g 0  . predniSONE (DELTASONE) 5 MG tablet Take 5 mg daily by mouth.    . Respiratory Therapy Supplies (FLUTTER) DEVI Use as directed 1 each 0  . SitaGLIPtin-MetFORMIN HCl (JANUMET XR) [id number] MG TB24 Take 1 tablet by mouth daily. 30 tablet 0  . TRULICITY 1.5 IW/9.7LG SOPN INJECT 1.5 MG INTO THE SKIN ONCE A WEEK 2 mL 4  . Turmeric 500 MG CAPS Take 500 mg by mouth daily.    . [DISCONTINUED] DETROL LA 2 MG 24 hr capsule TAKE 1 CAPSULE BY MOUTH DAILY 30 capsule 10   No current facility-administered medications on file prior to visit.     Past Medical History:  Diagnosis Date  . Allergic rhinitis   . Bronchitis   . Chronic rhinitis   . Cough   . Dyslipidemia   . Dysphagia   . GERD (gastroesophageal reflux disease)   . Headache(784.0)   . ILD (interstitial lung disease) ([place])   . Mild depression ([place])   . Morbid obesity ([place])   . Type II or diabetes mellitus, uncontrolled 11/2012 dx   Dx 12/08/12: a1c 10.0  . Unspecified essential hypertension   . Urge incontinence     Past Surgical History:  Procedure Laterality Date  . ABDOMINAL HYSTERECTOMY    . BILATERAL VATS ABLATION  02/15/04  . CESAREAN SECTION    . LUNG BIOPSY    . RIGHT HEART CATH N/A 01/30/2017   Procedure: Right Heart Cath;  Surgeon: Larey Dresser, MD;  Location: [facility];  Service: Cardiovascular;  Laterality: N/A;    Social History   Socioeconomic History  . Marital status: Married    Spouse name: Not on file  . Number of children: 1  . Years of education: Not on file  . Highest  education level: Not on file  Occupational History  . 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 Financial resource strain: Not hard at all  . Food insecurity:    Worry: Never true    Inability: Never true  . Transportation needs:    Medical: No    Non-medical: No  Tobacco Use  . Smoking status: Never Smoker  . Smokeless tobacco: Never  Used  . Tobacco comment: {  Substance and Sexual Activity  . Alcohol use: No    Alcohol/week: 0.0 oz  . Drug use: No  . Sexual activity: Never  Lifestyle  . Physical activity:    Days per week: 3 days    Minutes per session: 40 min  . Stress: To some extent  Relationships  . Social connections:    Talks on phone: More than three times a week    Gets together: More than three times a week    Attends religious service: More than 4 times per year    Active member of club or organization: Not on file    Attends meetings of clubs or organizations: More than 4 times per year    Relationship status: Married  Other Topics Concern  . Not on file  Social History Narrative  . Not on file    Family History  Problem Relation Age of Onset  . Sarcoidosis Mother        dxed by transbrochial bx  . Allergies Mother   . Heart disease Father   . Diabetes Father   . Allergies Father   . Coronary artery disease Father   . Cancer Maternal Grandmother        CA of unknown type  . Cancer Paternal Grandmother        CA of unknown type    Review of Systems  Constitutional: Negative for chills and fever.  HENT: Positive for postnasal drip and voice change (allergy related).   Respiratory: Negative for cough, shortness of breath and wheezing.   Cardiovascular: Positive for leg swelling (mild, intermittent, take hctz prn only). Negative for chest pain and palpitations.  Neurological: Negative for light-headedness and headaches.       Objective:   Vitals:   02/06/18 0848  BP: (!)  144/94  Pulse: 89  Resp: 16  Temp: 97.9 F (36.6 C)  SpO2: 97%   BP Readings from Last 3 Encounters:  02/06/18 (!) 144/94  11/26/17 132/90  11/12/17 120/80   Wt Readings from Last 3 Encounters:  02/06/18 267 lb (121.1 kg)  11/26/17 267 lb (121.1 kg)  11/12/17 269 lb (122 kg)   Body mass index is 52.14 kg/m.   Physical Exam    Constitutional: Appears well-developed and well-nourished. No distress.  HENT:  Head: Normocephalic and atraumatic.  Neck: Neck supple. No tracheal deviation present. No thyromegaly present.  No cervical lymphadenopathy Cardiovascular: Normal rate, regular rhythm and normal heart sounds.   No murmur heard. No carotid bruit .  No edema Pulmonary/Chest: Effort normal and breath sounds normal. No respiratory distress. No has no wheezes. No rales.  Skin: Skin is warm and dry. Not diaphoretic.  Psychiatric: Normal mood and affect. Behavior is normal.      Assessment & Plan:    See Problem List for Assessment and Plan of chronic medical problems.

## 2018-02-05 NOTE — Patient Instructions (Addendum)
  Test(s) ordered today. Your results will be released to [address] (or called to you) after review, usually within 72hours after test completion. If any changes need to be made, you will be notified at that same time.   Medications reviewed and updated.  Changes include staring farxiga 5 mg daily - we can increase this to 10 mg daily.  Also increase the losartan to 25 mg daily.  Your prescription(s) have been submitted to your pharmacy. Please take as directed and contact our office if you believe you are having problem(s) with the medication(s).   Please followup in 3 months

## 2018-02-06 ENCOUNTER — Other Ambulatory Visit (INDEPENDENT_AMBULATORY_CARE_PROVIDER_SITE_OTHER): Payer: Medicare Other

## 2018-02-06 ENCOUNTER — Encounter: Payer: Self-pay | Admitting: Internal Medicine

## 2018-02-06 ENCOUNTER — Ambulatory Visit: Payer: Medicare Other | Admitting: Internal Medicine

## 2018-02-06 VITALS — BP 144/94 | HR 89 | Temp 97.9°F | Resp 16 | Ht 60.0 in | Wt 267.0 lb

## 2018-02-06 DIAGNOSIS — E1165 Type 2 diabetes mellitus with hyperglycemia: Secondary | ICD-10-CM

## 2018-02-06 DIAGNOSIS — F419 Anxiety disorder, unspecified: Secondary | ICD-10-CM | POA: Diagnosis not present

## 2018-02-06 DIAGNOSIS — E785 Hyperlipidemia, unspecified: Secondary | ICD-10-CM | POA: Diagnosis not present

## 2018-02-06 DIAGNOSIS — I10 Essential (primary) hypertension: Secondary | ICD-10-CM

## 2018-02-06 LAB — LIPID PANEL
Cholesterol: 170 mg/dL (ref 0–200)
HDL: 48 mg/dL (ref >39.00)
LDL CALC: 110 mg/dL — AB (ref 0–99)
NONHDL: 122
Total CHOL/HDL Ratio: 4
Triglycerides: 59 mg/dL (ref 0.0–149.0)
VLDL: 11.8 mg/dL (ref 0.0–40.0)

## 2018-02-06 LAB — COMPREHENSIVE METABOLIC PANEL
ALK PHOS: 83 U/L (ref 39–117)
ALT: 16 U/L (ref 0–35)
AST: 12 U/L (ref 0–37)
Albumin: 3.7 g/dL (ref 3.5–5.2)
BUN: 10 mg/dL (ref 6–23)
CO2: 31 meq/L (ref 19–32)
Calcium: 9.2 mg/dL (ref 8.4–10.5)
Chloride: 98 mEq/L (ref 96–112)
Creatinine, Ser: 0.59 mg/dL (ref 0.40–1.20)
GFR: 141.21 mL/min (ref >60.00)
GLUCOSE: 259 mg/dL — AB (ref 70–99)
POTASSIUM: 3.8 meq/L (ref 3.5–5.1)
Sodium: 135 mEq/L (ref 135–145)
TOTAL PROTEIN: 7.1 g/dL (ref 6.0–8.3)
Total Bilirubin: 0.3 mg/dL (ref 0.2–1.2)

## 2018-02-06 LAB — HEMOGLOBIN A1C: Hgb A1c MFr Bld: 9.6 % — ABNORMAL HIGH (ref 4.6–6.5)

## 2018-02-06 MED ORDER — DAPAGLIFLOZIN PROPANEDIOL 5 MG PO TABS: 5.0000 mg | ORAL_TABLET | Freq: Every day | ORAL | 5 refills | Status: DC

## 2018-02-06 MED ORDER — LOSARTAN POTASSIUM 25 MG PO TABS: 25.0000 mg | ORAL_TABLET | Freq: Every day | ORAL | 3 refills | Status: DC

## 2018-02-06 NOTE — Assessment & Plan Note (Signed)
Check lipid panel  Continue daily statin Regular exercise and healthy diet encouraged  

## 2018-02-06 NOTE — Assessment & Plan Note (Signed)
Blood pressure slightly elevated here today She is currently only taking 12.5 mg of losartan daily we will increase this to 25 mg daily Continue low-sodium diet She will resume regular exercise once able She understands the need to work on weight loss Follow-up in 3 months, sooner if needed

## 2018-02-06 NOTE — Assessment & Plan Note (Signed)
Sugars not ideally controlled She is on chronic steroids and has been receiving occasional steroid injections, which is not helping She is currently not exercising due to hip pain, but plans on restarting Stress diabetic diet Will start Farxiga 5 mg daily-increase to 10 mg after 1 month if sugar still elevated and no side effects Continue other medications Follow-up in 3 months, sooner if sugars not controlled

## 2018-02-08 ENCOUNTER — Telehealth: Payer: Self-pay | Admitting: Emergency Medicine

## 2018-02-08 NOTE — Telephone Encounter (Signed)
Please advise on alternative. Will not cover

## 2018-02-08 NOTE — Telephone Encounter (Signed)
PA completed for [name]

## 2018-02-10 MED ORDER — EMPAGLIFLOZIN 10 MG PO TABS: 10.0000 mg | ORAL_TABLET | Freq: Every day | ORAL | 5 refills | Status: DC

## 2018-02-10 NOTE — Telephone Encounter (Signed)
Spoke with pt, pt came by office to get savings card.

## 2018-02-10 NOTE — Telephone Encounter (Signed)
Pt can not use saving card due to having [employer]. Pharmacy sent fax with alternatives: Invokana or Jardiance.

## 2018-02-10 NOTE — Telephone Encounter (Signed)
jardiance sent.

## 2018-02-10 NOTE — Telephone Encounter (Signed)
We can try the savings card

## 2018-02-10 NOTE — Addendum Note (Signed)
Addended by: Binnie Rail on: 02/10/2018 01:01 PM   Modules accepted: Orders

## 2018-02-10 NOTE — Telephone Encounter (Signed)
Spoke with pt to inform.  

## 2018-02-25 ENCOUNTER — Encounter: Payer: Self-pay | Admitting: Internal Medicine

## 2018-02-25 ENCOUNTER — Ambulatory Visit: Payer: Medicare Other | Admitting: Internal Medicine

## 2018-02-25 VITALS — BP 130/82 | HR 102 | Ht 60.0 in | Wt 263.4 lb

## 2018-02-25 DIAGNOSIS — J8410 Pulmonary fibrosis, unspecified: Secondary | ICD-10-CM | POA: Diagnosis not present

## 2018-02-25 DIAGNOSIS — J9611 Chronic respiratory failure with hypoxia: Secondary | ICD-10-CM

## 2018-02-25 NOTE — Assessment & Plan Note (Signed)
rx = 2lpm hs and prn with activity up to 3lpm   Reminded goal with ex is to keep sat > 90% at all times so ok to turn up to reach that goal and call if can't

## 2018-02-25 NOTE — Progress Notes (Signed)
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  Since then waxing/waning sx requiring intermittent prednisone tapers with only minimal improvement - most of her symptoms chronically have been related to obesity with dyspnea on exertion and tendency to chronic cough which is felt to be at least partly  reflux related but improved with saba 03/09/2015 so started on dulera 100 2bid in addition to maint pred and seems to have helped but not typically  able to get < 10 mg pred s flare cough/sob     History of Present Illness  November 07, 2010 ov cc cough/ strangling  a bit more with taper off reglan (actually worse before ran out of the low dose reglan)  no excess mucus or increase sob on prednisone @  10 mg daily .  Using med calendar well rec 1)  Ok to resume water aerobics but pace yourself 2)  Wait until your swallowing evaulation is complete before making a decision re reglan > stopped it on her own.  3)  Try Prednisone 10 mg one-half each am   04/04/2012 f/u ov/[name] no longer using med calendar on Pred 10 mg one daily  added hyzaar to rx for hbp/leg swelling not back to mall walking yet,  No unusual cough, purulent sputum or sinus/hb symptoms on present rx. No variability to symptoms or need for inhaler. Rec Try prednisone 10 mg one half daily to see if affects your ability to walk the mall on 02 or the cough gets worse (walk the mall first for at least before you try) Always walk for exercise on 3lpm - this will help you burn fat and get into negative calorie balance to lose weight.   03/17/2013 f/u ov/[name] re NSIP on floor of 10 mg per day Chief Complaint  Patient presents with  . Follow-up    Breathing is overall doing well and she denies any new co's today.   ex on a treadmill x 41m x 3 x daily on 2lpm not the 3lpm as rec >>Prednisone  ceiling is 20 mg per day and floor is 10 mg per day        10/06/2014 f/u ov/[name] re: pred 10 mg daily after trying 10/5 until Tgiving and worse cough/ sob  Chief Complaint  Patient presents with  . Follow-up    F/U ILD; breathing doing fine; no complaints  Not doing treadmill  Any more but Not limited by breathing from desired activities   rec Resume treadmill x 30 min daily minimum If  doing great try to reduce prednisone to 10 mg alternating with 5 mg daily as your new floor but increase back to ceiling of 20 mg per day if needed     03/09/2015 f/u ov/[name] re: NSIP  pred 10 mg daily could not tol 10/5  Chief Complaint  Patient presents with  . Follow-up    PFT done today. Pt states her breathing is overall doing well, unless she gets exposed to hot/humid weather. She is taking 10 mg pred daily.   2lpm sleep/ 3 lpm ex , flare of cough with < 10 mg per day pred, better cough p albuterol  rec Work on inhaler technique:    Try dulera 100 Take 2 puffs first thing in am and then another  2 puffs about 12 hours later> helped some        02/06/2016  f/u ov/[name] re: NSIP / 3lpm with ex/ duler 100 2bid and pred 10 mg daily  Chief Complaint  Patient presents with  . Follow-up    Pt states that she did have some issues with allergies but is improving. Pt also was having issues with Dulera, jittery after use, but has started rinsing her mouth after use and side effects have improved. Pt denies cough/wheeze/SOB/CP/tightness.   limited by L knee from doing treadmill, has trainer but not losing wt  Rhinitis symptoms better on singulair   rec Work on inhaler technique:    Should be able to tolerate dulera 100 Take 2 puffs first thing in am and then another 2 puffs about 12 hours later.  If so, try prednisone 10 - 5  = even days take a half, odd days take half   06/01/2016  f/u ov/[name] re: NSIP  Chief Complaint  Patient presents with  . Follow-up    Breathing is doing well. She denies any new  co's today.   when ex 3lpm and back on treadmill x 25 min more limited by knee 2-3x per week at 33mph flat / minimal dry daytime cough on nexium 20 mg bid ac  rec Work on inhaler technique:   Continue  dulera 100 Take 2 puffs first thing in am and then another 2 puffs about 12 hours later.  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 Follow-up    Increased cough x 2 wks  on treadmill x 3lpm x 25 min  Decreased pred Nov 13th and cough worse starting on 23rd  Cough worse at hs and early in am but non-productive  Nose feels dried out/ clogged up/ saline helps  rec Stay on singulair each evening Try dulera 200 Take 2 puffs first thing in am and then another 2 puffs about 12 hours later to see if helps or not by the time you return  Protonix 40 mg Take 30- 60 min before your first and last meals of the day  Prednisone 10 mg daily until better then 10 alternating with 5 mg daily      NP ov 10/05/16 rec Follow med calendar closely and bring to each visit  Return to Prednisone 10mg  daily  Labs today .  Set up for HRCT Chest    07/12/2017 acute extended ov/[name] re: cough and "wheeze'  Chief Complaint  Patient presents with  . Acute Visit    went to UC on 07/08/17 with cough and wheezing. She states dxed with URI. She is still coughing with minimal green sputum and wheezing some.   prednisone from 10 mg to 5 mg over months and on 5mg  all summer ok  On dulera 100 Take 2 puffs first thing in am and then another 2 puffs about 12 hours later but inhaler is on zero,  Not using rescue correctly and did not bring it or med calendar as req   Acutely worse 07/04/17 with cough/ wheeze/ nasal congestion and reqported to UC she did not use saba rec 07/08/17 UC: Start using the albuterol HFA 2 puffs every 4 hours for cough and wheeze. Continue  using your other inhalers. Start taking the prednisone 50  mg x 6 days  Take with food. For drainage recommend taking Allegra or Zyrtec daily for the next few days. For nasal congestion Sudafed PE 10 mg every 4-6 hours. rec  For cough > mucinex dm 1200 mg every 12 hours and use use the flutter valve as needed but if still can't stop coughing then add  Tylenol #3 one every 4 hours as needed Continue prednisone 40 x 2 , 30 x 2, 10 x 2 days, and 5 mg daily THEREAFTER ZPAK    08/26/18 NP / med calendar    11/26/2017  f/u ov/[name] re:   NSIP/ cough variant asthma/MO / 02 2lpm at bedtime/ 3lpm ex  Doing better with med calendar/action plan Chief Complaint  Patient presents with  . Follow-up    Breathing is doing well. She has only used rescue inhaler x 2 since the last visit.   Dyspnea:  Recumbent bicycle x 20 min / walk 2.6 mph flat on 3lpm low 90's on pred 5 mg  Cough: no need for any cough suppression Sleep: at 30 degrees on 2lpm/ no am ha ra SABA use:  Rarely need any saba  rec Ok when return from [place] to reduce symbicort 80 to 1-2 every 12 hours  If worsen, double the prednsione dose until better then back to 5 mg daily  Work on inhaler technique.     02/25/2018  f/u ov/[name] re: NSIP / pred dep/  MO / 02 dep hs and ex but not with adls  Chief Complaint  Patient presents with  . Follow-up    Breathing is overall doing well. She c/o sneezing and PND.  She is using her albuterol inhaler 2 x per wk on average.    Dyspnea:  Ex:  Treadmill x 3 lpm x 3x per week x 2.5 mph x 30 min flat sob not oob Cough: none though has pnds controls with zyrtec or  Sleep: on 2lpm with lots of pillows but bed is flat  SABA use:  As above   No obvious day to day or daytime variability or assoc excess/ purulent sputum or mucus plugs or hemoptysis or cp or chest tightness, subjective wheeze or overt sinus or hb symptoms. No unusual exposure hx or h/o childhood pna/ asthma or knowledge of premature  birth.  Sleeping  As above  without nocturnal  or early am exacerbation  of respiratory  c/o's or need for noct saba. Also denies any obvious fluctuation of symptoms with weather or environmental changes or other aggravating or alleviating factors except as outlined above   Current Allergies, Complete Past Medical History, Past Surgical History, Family History, and Social History were reviewed in [employer] record.  ROS  The following are not active complaints unless bolded Hoarseness, sore throat, dysphagia, dental problems, itching, sneezing,  nasal congestion or discharge of excess mucus or purulent secretions, ear ache,   fever, chills, sweats, unintended wt loss or wt gain, classically pleuritic or exertional cp,  orthopnea pnd or arm/hand swelling  or leg swelling, presyncope, palpitations, abdominal pain, anorexia, nausea, vomiting, diarrhea  or change in bowel habits or change in bladder habits, change in stools or change in urine, dysuria, hematuria,  rash, arthralgias, visual complaints, headache, numbness, weakness or ataxia or problems with walking or coordination,  change in mood or  memory.        Current Meds  Medication Sig  . acetaminophen-codeine (TYLENOL #3) 300-30 MG tablet One every 4 hours as needed for  cough  . albuterol (PROVENTIL HFA;VENTOLIN HFA) 108 (90 Base) MCG/ACT inhaler Inhale 2 puffs into the lungs every 4 (four) hours as needed for wheezing or shortness of breath.  Marland Kitchen aspirin 81 MG chewable tablet Chew 81 mg by mouth daily.  Marland Kitchen atorvastatin (LIPITOR) 20 MG tablet TAKE ONE TABLET BY MOUTH DAILY  . B COMPLEX VITAMINS SL Place 1 mL under the tongue 4 (four) times a week.  . budesonide-formoterol (SYMBICORT) 80-4.5 MCG/ACT inhaler Inhale 2 puffs into the lungs 2 (two) times daily.  . calcium carbonate (CALCIUM 600) 600 MG TABS tablet Take 600 mg by mouth daily with breakfast.  . Diclofenac Sodium (PENNSAID) 2 % SOLN Place 2 g onto the skin 2  (two) times daily.  . empagliflozin (JARDIANCE) 10 MG TABS tablet Take 10 mg by mouth daily.  . ferrous sulfate 325 (65 FE) MG EC tablet Take 325 mg by mouth daily.  . fluticasone (FLONASE) 50 MCG/ACT nasal spray Place 2 sprays into both nostrils daily as needed for allergies or rhinitis.  Marland Kitchen glucose blood (ONE TOUCH ULTRA TEST) test strip USE TO CHECK BLOOD SUGARS TWICE A DAY  . hydrochlorothiazide (HYDRODIURIL) 12.5 MG tablet Take 12.5 mg by mouth daily as needed (leg swelling).  Marland Kitchen loratadine (CLARITIN) 10 MG tablet Take 10 mg by mouth daily.   Marland Kitchen losartan (COZAAR) 25 MG tablet Take 1 tablet (25 mg total) by mouth daily.  . mometasone-formoterol (DULERA) 100-5 MCG/ACT AERO Take 2 puffs first thing in am and then another 2 puffs about 12 hours later.  . montelukast (SINGULAIR) 10 MG tablet Take 1 tablet (10 mg total) by mouth at bedtime.  . OXYGEN 2lpm with sleep and 3lpm with exertion  . pantoprazole (PROTONIX) 40 MG tablet Take 1 tablet (40 mg total) by mouth 2 (two) times daily before a meal.  . predniSONE (DELTASONE) 5 MG tablet Take 5 mg daily by mouth.  . Respiratory Therapy Supplies (FLUTTER) DEVI Use as directed  . SitaGLIPtin-MetFORMIN HCl (JANUMET XR) [phone] MG TB24 Take 1 tablet by mouth daily.  . TRULICITY 1.5 ZO/1.0RU SOPN INJECT 1.5 MG INTO THE SKIN ONCE A WEEK  . Turmeric 500 MG CAPS Take 500 mg by mouth daily.  . [DISCONTINUED] oxybutynin (DITROPAN-XL) 10 MG 24 hr tablet Take 10 mg by mouth at bedtime.  . [DISCONTINUED] polyethylene glycol powder (MIRALAX) powder Take 255 g by mouth 2 (two) times daily with a meal.               Past Medical History: GASTROESOPHAGEAL REFLUX DISEASE     - Tapered reglan to 10 mg one half twice daily June 22, 2010 > d/c'd 11/2010  -restarted reglan 03/2011 >>unable to tolerate.  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)   -Restart Prednisone  02/24/08   -PFT's December 23, 2008 VC 35%  DLC0 31%   -Desats with > 2 laps 06/22/10 so rec wear 02 2lpm at bedtime and with ex  MORBID OBESITY   - Target wt  =  153  for BMI < 30  Hypertension Health Maintenance..........................................................Marland KitchenMarland KitchenLeschber    - Pneumovax July 22, 2009      - Flu 06/21/2011           Objective:   Physical Exam  amb bf nad still cough with repeated deep breathing   Vital signs reviewed - Note on arrival 02 sats  94% on RA      wt  305 May 28, 2008 >  302 November 07, 2010 >   303 03/22/2011 > 10/25/2011  299 > 311 04/04/2012 > 306 03/17/2013 >06/15/2013 303 > 10/06/2013 298  > 02/08/2014 289 > 10/06/2014   288 > 03/09/2015 283 > 10/21/2015    283 >  02/06/2016  284 > 06/01/2016  282 > 08/31/2016   281 > 07/12/2017   270 > 11/26/2017   263   HEENT: nl dentition, turbinates bilaterally, and oropharynx. Nl external ear canals without cough reflex   NECK :  without JVD/Nodes/TM/ nl carotid upstrokes bilaterally   LUNGS: no acc muscle use,  Nl contour chest which is clear to A and P bilaterally without cough on insp or exp maneuvers   CV:  RRR  no s3 or murmur or increase in P2, and no edema   ABD:  soft and nontender with nl inspiratory excursion in the supine position. No bruits or organomegaly appreciated, bowel sounds nl  MS:  Nl gait/ ext warm without deformities, calf tenderness, cyanosis or clubbing No obvious joint restrictions   SKIN: warm and dry without lesions    NEURO:  alert, approp, nl sensorium with  no motor or cerebellar deficits apparent.         Assessment & Plan:

## 2018-02-25 NOTE — Patient Instructions (Addendum)
Keep up the good work on the treadmill - always push to where you a little short of breath but never out of breath and assure that your 02 sats is above 90% while exercising.   See calendar for specific medication instructions and bring it back for each and every office visit for every healthcare provider you see.  Without it,  you may not receive the best quality medical care that we feel you deserve.  You will note that the calendar groups together  your maintenance  medications that are timed at particular times of the day.  Think of this as your checklist for what your doctor has instructed you to do until your next evaluation to see what benefit  there is  to staying on a consistent group of medications intended to keep you well.  The other group at the bottom is entirely up to you to use as you see fit  for specific symptoms that may arise between visits that require you to treat them on an as needed basis.  Think of this as your action plan or "what if" list.   Separating the top medications from the bottom group is fundamental to providing you adequate care going forward.    Please schedule a follow up visit in 3 months but call sooner if needed  Late add:  Inquire re rheum f/u next ov

## 2018-02-25 NOTE — Assessment & Plan Note (Signed)
Body mass index is 51.44 kg/m.  -  trending down/ encouraged Lab Results  Component Value Date   TSH 1.25 05/09/2017     Contributing to gerd risk/ doe/reviewed the need and the process to achieve and maintain neg calorie balance > defer f/u primary care including intermittently monitoring thyroid status

## 2018-02-25 NOTE — Assessment & Plan Note (Signed)
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)   -Restarted Prednisone 02/24/08   -PFT's December 23, 2008 VC 35%  DLC0 31%   -PFT's 02/08/2014   VC 1.30 (49%) and DLCO is 70%  > rec pred 10/5 > did not tolerate - PFTs  03/09/2015     VC 1.27 (48%) and dlco is 72% @ 10 mg daily  - 02/06/2016 try 10 a/w 5 mg daily > did not try - 06/01/2016 try 10 a/w 5 daily > cough  flared p 1 week on lower dose  -ANA positive 10/2016 >refer to rheumatology  -HRCT chest essentially stable changes since 2005 10/09/2016 >  Rheum eval 12/03/16 [facility]/aron Gray but did not f/u    The goal with a chronic steroid dependent illness is always arriving at the lowest effective dose that controls the disease/symptoms and not accepting a set "formula" which is based on statistics or guidelines that don't always take into account patient  variability or the natural hx of the dz in every individual patient, which may well vary over time.  For now therefore I recommend the patient maintain  10 mg ceiling and 5 mg floor and as long as she remains on such a small dose will await discussion of further rheum eval until returns  I had an extended discussion with the patient reviewing all relevant studies completed to date and  lasting 15 to 20 minutes of a 25 minute visit    Each maintenance medication was reviewed in detail including most importantly the difference between maintenance and prns and under what circumstances the prns are to be triggered using an action plan format that is not reflected in the computer generated alphabetically organized AVS but trather by a customized med calendar that reflects the AVS meds with confirmed 100% correlation.   In addition, Please see AVS for unique instructions that I personally wrote and verbalized to the the pt in detail and then reviewed with pt  by my nurse highlighting any  changes in therapy recommended at today's visit to their plan of care.

## 2018-02-26 IMAGING — CR DG CHEST 2V
2 series · 2 of 2 positions shown · non-contrast
Comparison: PA and lateral chest x-ray November 06, 2016

CLINICAL DATA: One week of chest congestion with increased
hoarseness. History of interstitial lung disease, gastroesophageal
reflux, acute on chronic respiratory failure, diabetes, never
smoked.

EXAM:
CHEST  2 VIEW

[w chest pa]
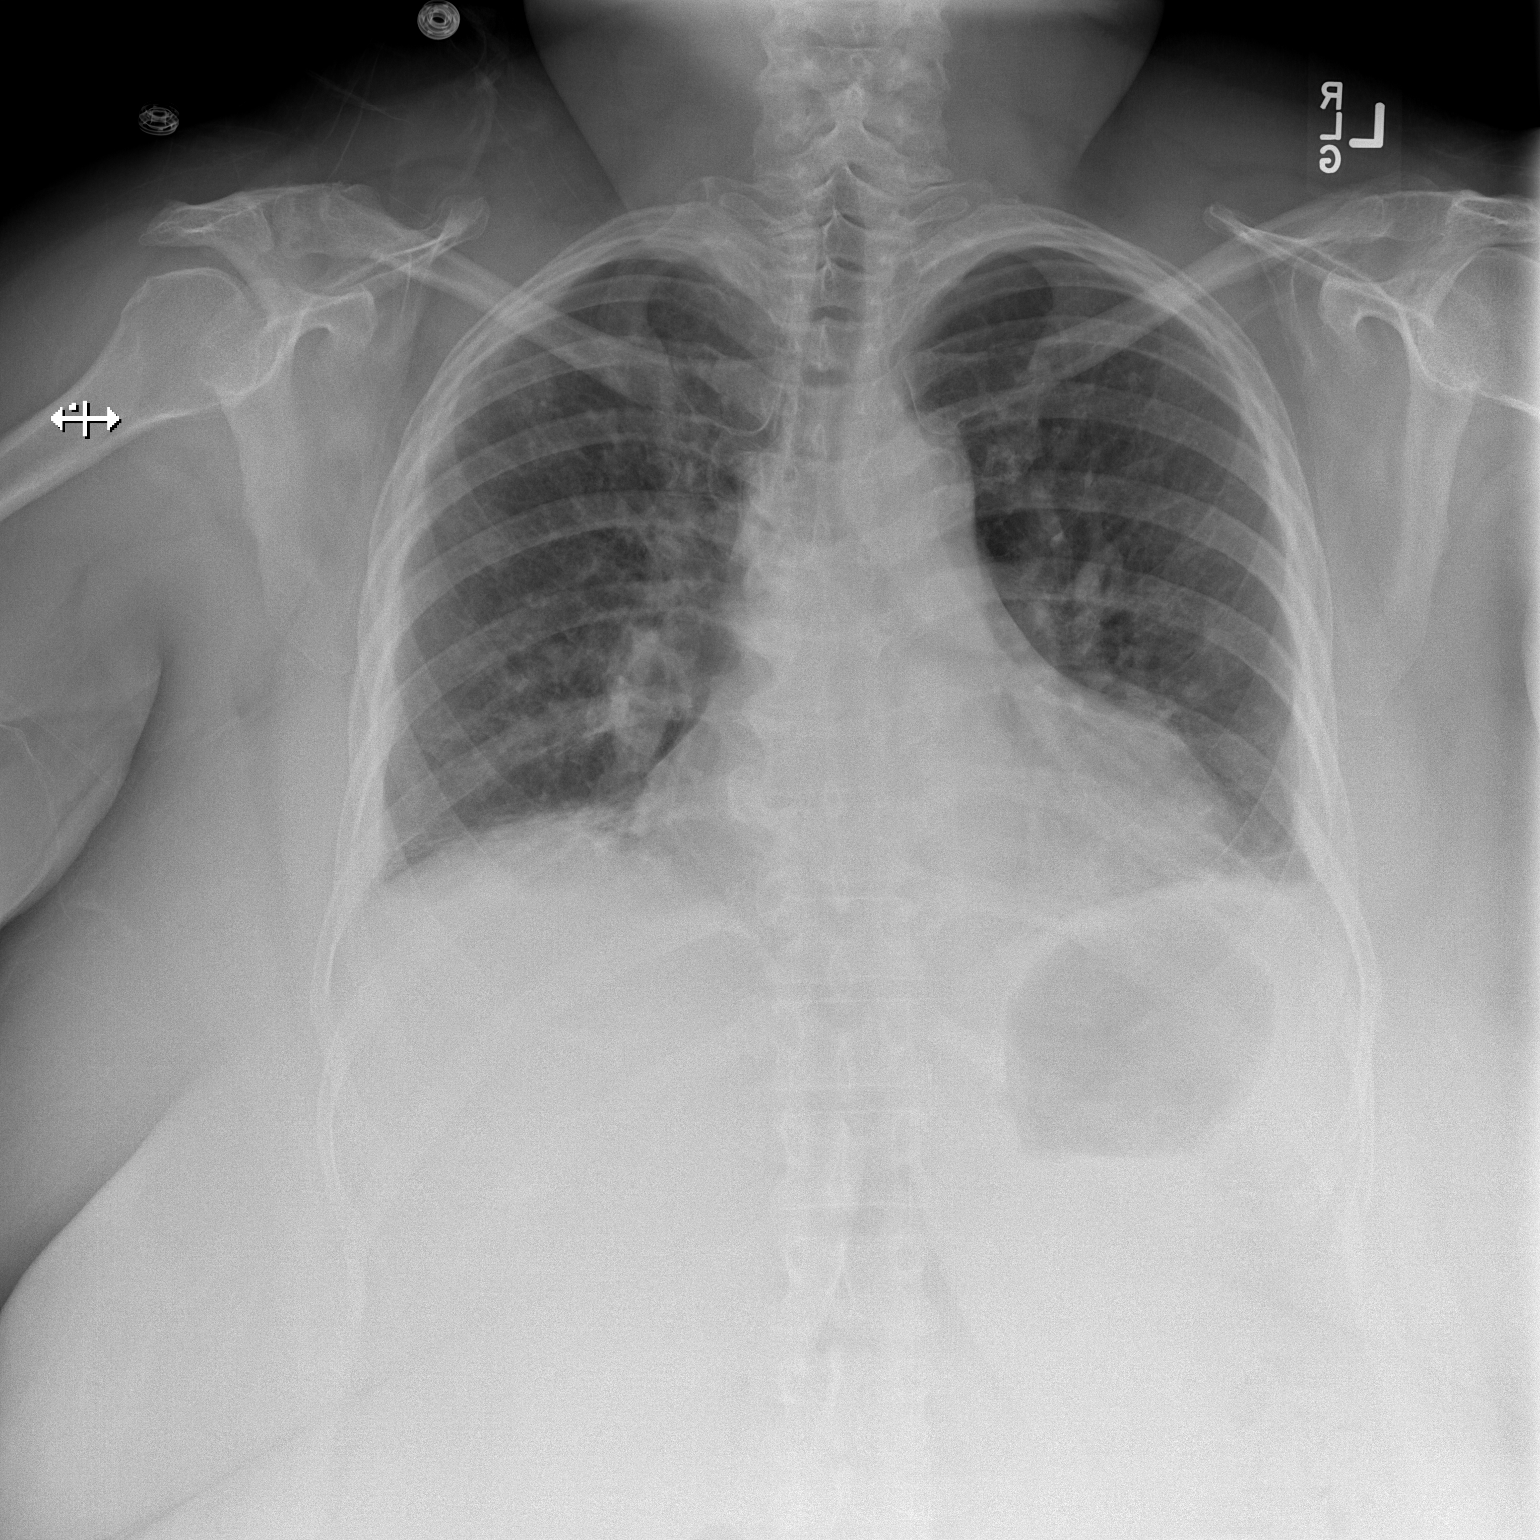

[w chest lat]
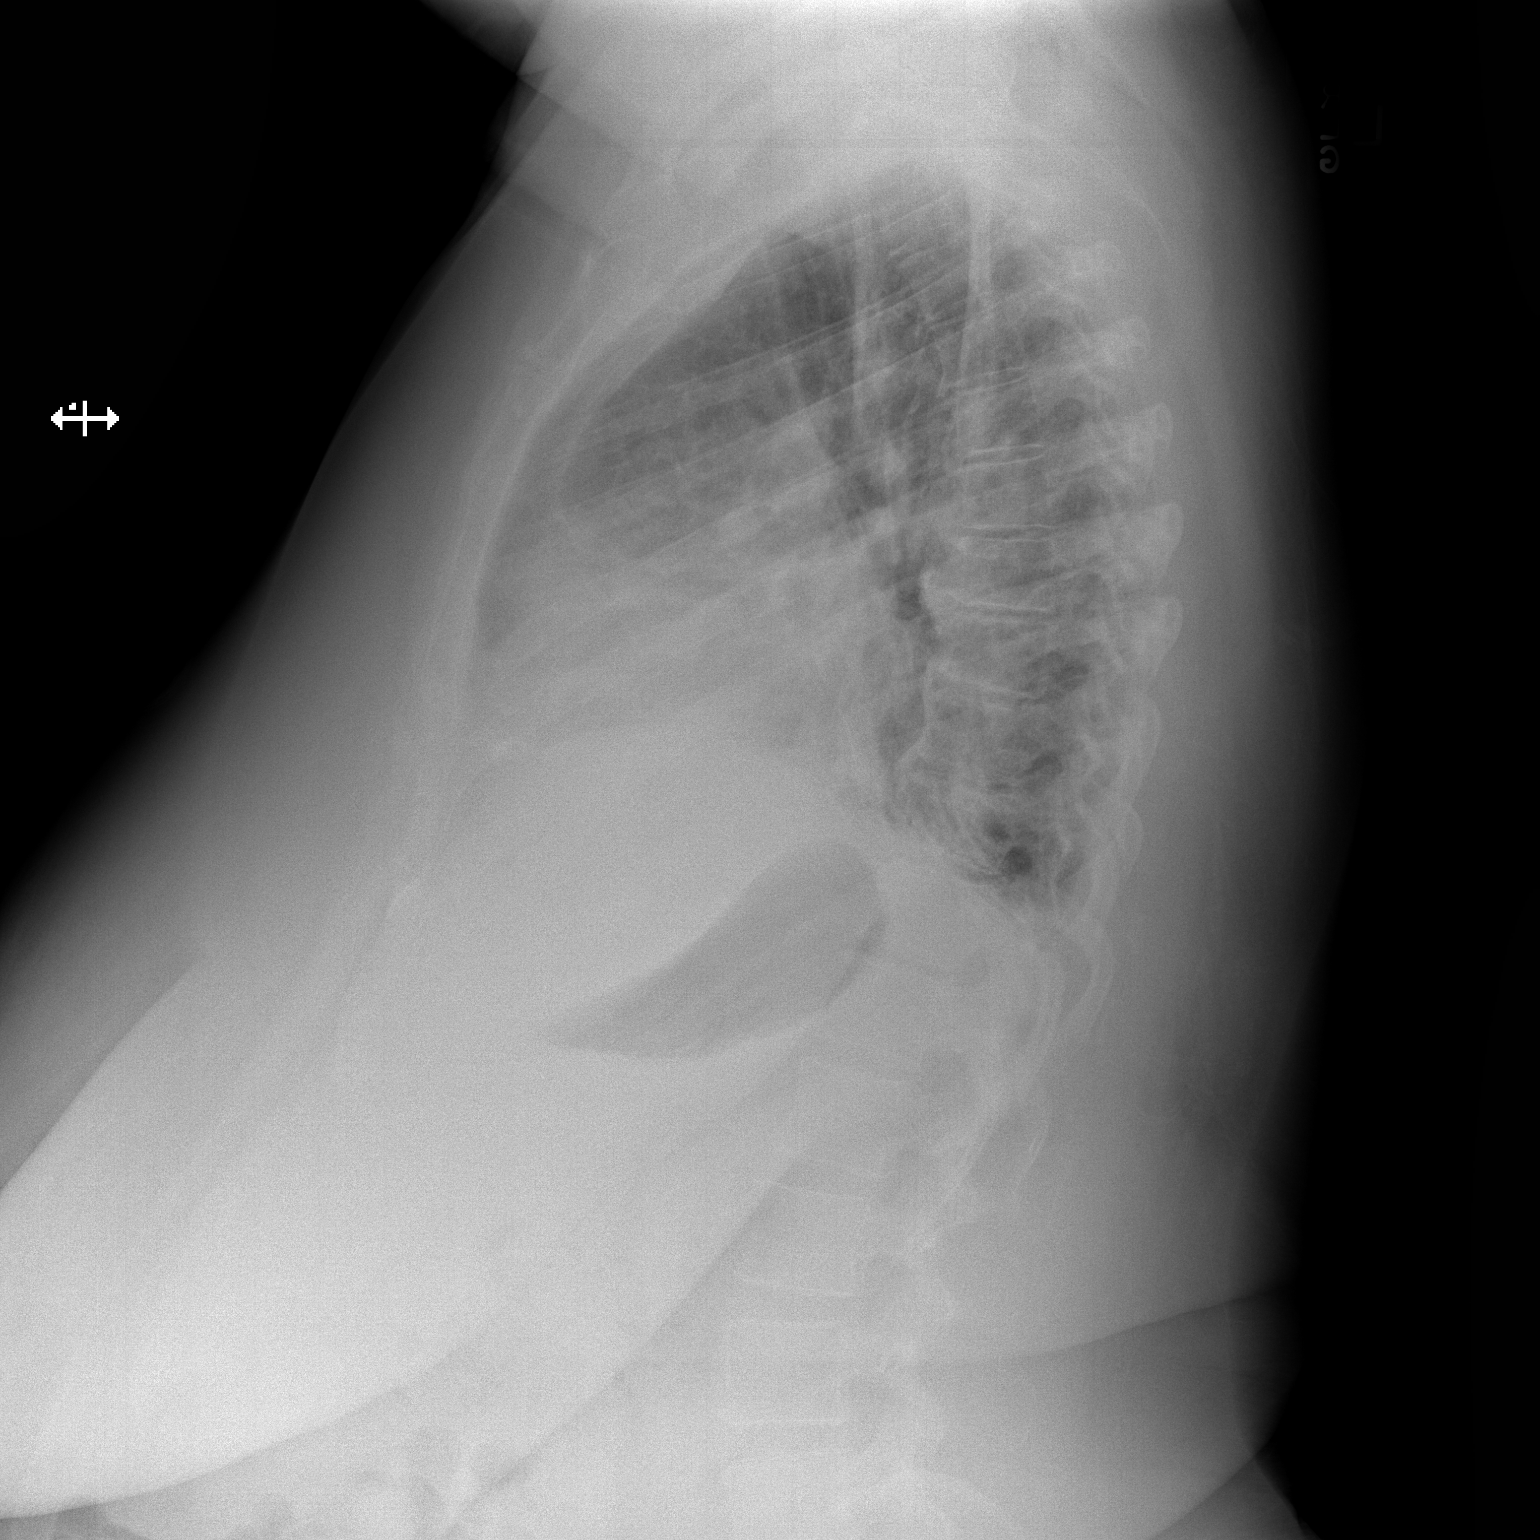

[2 of 2 positions shown; findings below may reference images not displayed]

FINDINGS: The lungs are borderline hypoinflated. There are bibasilar
densities. There is minimal blunting of the costophrenic angles. The
cardiac silhouette is enlarged. The central pulmonary vascularity is
prominent but stable. The mediastinum is normal in width. The bony
thorax exhibits no acute abnormality.
IMPRESSION: Bibasilar atelectasis or early pneumonia. This is superimposed upon
chronic interstitial prominence diffusely. Cardiomegaly with central
pulmonary vascular congestion but no definite pulmonary edema.

Followup PA and lateral chest X-ray is recommended in 3-4 weeks
following trial of antibiotic therapy to ensure resolution and
exclude underlying malignancy.

## 2018-03-11 ENCOUNTER — Other Ambulatory Visit: Payer: Self-pay | Admitting: Internal Medicine

## 2018-04-02 ENCOUNTER — Other Ambulatory Visit: Payer: Self-pay | Admitting: Internal Medicine

## 2018-05-11 NOTE — Progress Notes (Signed)
Subjective:    Patient ID: [name], female    DOB: 1970/12/12, 47 y.o.   MRN: [id number]  HPI The patient is here for follow up.  Diabetes: She is taking her medication daily as prescribed. She is fairly compliant with a diabetic diet. She is not exercising regularly. She monitors her sugars and they have been running daily average of 122. She checks her feet daily and denies foot lesions. She is up-to-date with an ophthalmology examination.   Hypertension: She is taking her medication daily. She is compliant with a low sodium diet.  She denies chest pain, palpitations, edema, shortness of breath and regular headaches. She is not exercising regularly.  She does not monitor her blood pressure at home.    Hyperlipidemia: She is taking her medication daily. She is compliant with a low fat/cholesterol diet. She is not exercising regularly. She denies myalgias.     Medications and allergies reviewed with patient and updated if appropriate.  Patient Active Problem List   Diagnosis Date Noted  . Anxiety 02/05/2018  . Peroneal tendinitis of lower leg, right 10/22/2017  . Chronic respiratory failure with hypoxia ([place]) 07/20/2017  . Knee pain, left 02/02/2016  . Cough variant asthma 03/09/2015  . Diabetes type 2, uncontrolled ([place]) 12/09/2012  . Dyslipidemia   . Acute on chronic respiratory failure with hypoxia ([place]) 04/05/2012  . CONJUNCTIVITIS, ALLERGIC, SEASONAL 01/05/2010  . Allergic rhinitis 01/05/2010  . Essential hypertension 09/07/2009  . URINARY INCONTINENCE, URGE 03/24/2009  . CHRONIC RHINITIS 12/23/2008  . Morbid obesity due to excess calories ([place]) 10/10/2007  . INTERSTITIAL LUNG DISEASE 10/10/2007  . GASTROESOPHAGEAL REFLUX DISEASE 10/10/2007    Current Outpatient Medications on File Prior to Visit  Medication Sig Dispense Refill  . acetaminophen-codeine (TYLENOL #3) 300-30 MG tablet One every 4 hours as needed for cough 40 tablet 0  . albuterol (PROVENTIL  HFA;VENTOLIN HFA) 108 (90 Base) MCG/ACT inhaler Inhale 2 puffs into the lungs every 4 (four) hours as needed for wheezing or shortness of breath. 1 Inhaler 0  . aspirin 81 MG chewable tablet Chew 81 mg by mouth daily.    Marland Kitchen atorvastatin (LIPITOR) 20 MG tablet TAKE ONE TABLET BY MOUTH DAILY 90 tablet 1  . B COMPLEX VITAMINS SL Place 1 mL under the tongue 4 (four) times a week.    . budesonide-formoterol (SYMBICORT) 80-4.5 MCG/ACT inhaler Inhale 2 puffs into the lungs 2 (two) times daily. 1 Inhaler 0  . calcium carbonate (CALCIUM 600) 600 MG TABS tablet Take 600 mg by mouth daily with breakfast.    . Diclofenac Sodium (PENNSAID) 2 % SOLN Place 2 g onto the skin 2 (two) times daily. 112 g 3  . empagliflozin (JARDIANCE) 10 MG TABS tablet Take 10 mg by mouth daily. 30 tablet 5  . ferrous sulfate 325 (65 FE) MG EC tablet Take 325 mg by mouth daily.    . fluticasone (FLONASE) 50 MCG/ACT nasal spray Place 2 sprays into both nostrils daily as needed for allergies or rhinitis.    Marland Kitchen glucose blood (ONE TOUCH ULTRA TEST) test strip USE TO CHECK BLOOD SUGARS TWICE A DAY 100 each 1  . hydrochlorothiazide (HYDRODIURIL) 12.5 MG tablet Take 12.5 mg by mouth daily as needed (leg swelling).    Marland Kitchen loratadine (CLARITIN) 10 MG tablet Take 10 mg by mouth daily.     Marland Kitchen losartan (COZAAR) 25 MG tablet Take 1 tablet (25 mg total) by mouth daily. 90 tablet 3  . mometasone-formoterol (  DULERA) 100-5 MCG/ACT AERO Take 2 puffs first thing in am and then another 2 puffs about 12 hours later. 1 Inhaler 11  . montelukast (SINGULAIR) 10 MG tablet Take 1 tablet (10 mg total) by mouth at bedtime. 30 tablet 5  . OXYGEN 2lpm with sleep and 3lpm with exertion    . pantoprazole (PROTONIX) 40 MG tablet Take 1 tablet (40 mg total) by mouth 2 (two) times daily before a meal. 60 tablet 11  . predniSONE (DELTASONE) 5 MG tablet Take 5 mg daily by mouth.    . Respiratory Therapy Supplies (FLUTTER) DEVI Use as directed 1 each 0  .  SitaGLIPtin-MetFORMIN HCl (JANUMET XR) [id number] MG TB24 Take 1 tablet by mouth daily. 30 tablet 0  . TRULICITY 1.5 QB/3.4LP SOPN INJECT 1.5 MG UNDER THE SKIN ONCE WEEKLY 12 pen 1  . Turmeric 500 MG CAPS Take 500 mg by mouth daily.    . [DISCONTINUED] DETROL LA 2 MG 24 hr capsule TAKE 1 CAPSULE BY MOUTH DAILY 30 capsule 10   No current facility-administered medications on file prior to visit.     Past Medical History:  Diagnosis Date  . Allergic rhinitis   . Bronchitis   . Chronic rhinitis   . Cough   . Dyslipidemia   . Dysphagia   . GERD (gastroesophageal reflux disease)   . Headache(784.0)   . ILD (interstitial lung disease) ([place])   . Mild depression ([place])   . Morbid obesity ([place])   . Type II or diabetes mellitus, uncontrolled 11/2012 dx   Dx 12/08/12: a1c 10.0  . Unspecified essential hypertension   . Urge incontinence     Past Surgical History:  Procedure Laterality Date  . ABDOMINAL HYSTERECTOMY    . BILATERAL VATS ABLATION  02/15/04  . CESAREAN SECTION    . LUNG BIOPSY    . RIGHT HEART CATH N/A 01/30/2017   Procedure: Right Heart Cath;  Surgeon: Larey Dresser, MD;  Location: [facility];  Service: Cardiovascular;  Laterality: N/A;    Social History   Socioeconomic History  . Marital status: Married    Spouse name: Not on file  . Number of children: 1  . Years of education: Not on file  . Highest education level: Not on file  Occupational History  . 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 Financial resource strain: Not hard at all  . Food insecurity:    Worry: Never true    Inability: Never true  . Transportation needs:    Medical: No    Non-medical: No  Tobacco Use  . Smoking status: Never Smoker  . Smokeless tobacco: Never Used  . Tobacco comment: {  Substance and Sexual Activity  . Alcohol use: No    Alcohol/week: 0.0 standard drinks  . Drug use: No  . Sexual activity:  Never  Lifestyle  . Physical activity:    Days per week: 3 days    Minutes per session: 40 min  . Stress: To some extent  Relationships  . Social connections:    Talks on phone: More than three times a week    Gets together: More than three times a week    Attends religious service: More than 4 times per year    Active member of club or organization: Not on file    Attends meetings of clubs or organizations: More than 4 times per  year    Relationship status: Married  Other Topics Concern  . Not on file  Social History Narrative  . Not on file    Family History  Problem Relation Age of Onset  . Sarcoidosis Mother        dxed by transbrochial bx  . Allergies Mother   . Heart disease Father   . Diabetes Father   . Allergies Father   . Coronary artery disease Father   . Cancer Maternal Grandmother        CA of unknown type  . Cancer Paternal Grandmother        CA of unknown type    Review of Systems  Constitutional: Negative for fever.  Respiratory: Positive for shortness of breath (with heat and humidity only). Negative for cough and wheezing.   Cardiovascular: Negative for chest pain, palpitations and leg swelling.  Musculoskeletal: Negative for myalgias.  Neurological: Negative for light-headedness and headaches.       Objective:   Vitals:   05/12/18 0756  BP: 126/82  Pulse: 89  Resp: 16  Temp: 97.8 F (36.6 C)  SpO2: 93%   BP Readings from Last 3 Encounters:  05/12/18 126/82  02/25/18 130/82  02/06/18 (!) 144/94   Wt Readings from Last 3 Encounters:  05/12/18 256 lb (116.1 kg)  02/25/18 263 lb 6.4 oz (119.5 kg)  02/06/18 267 lb (121.1 kg)   Body mass index is 50 kg/m.   Physical Exam    Constitutional: Appears well-developed and well-nourished. No distress.  HENT:  Head: Normocephalic and atraumatic.  Neck: Neck supple. No tracheal deviation present. No thyromegaly present.  No cervical lymphadenopathy Cardiovascular: Normal rate, regular  rhythm and normal heart sounds.   No murmur heard. No carotid bruit .  No edema Pulmonary/Chest: Effort normal and breath sounds normal. No respiratory distress. No has no wheezes. No rales.  Skin: Skin is warm and dry. Not diaphoretic.  Psychiatric: Normal mood and affect. Behavior is normal.   Diabetic Foot Exam - Simple   Simple Foot Form Diabetic Foot exam was performed with the following findings:  Yes 05/12/2018  8:08 AM  Visual Inspection No deformities, no ulcerations, no other skin breakdown bilaterally:  Yes Sensation Testing Intact to touch and monofilament testing bilaterally:  Yes Pulse Check Posterior Tibialis and Dorsalis pulse intact bilaterally:  Yes Comments       Assessment & Plan:    See Problem List for Assessment and Plan of chronic medical problems.

## 2018-05-11 NOTE — Patient Instructions (Addendum)
  Test(s) ordered today. Your results will be released to MyChart (or called to you) after review, usually within 72hours after test completion. If any changes need to be made, you will be notified at that same time.   Medications reviewed and updated.  No changes recommended at this time.    Please followup in 3 months   

## 2018-05-12 ENCOUNTER — Encounter: Payer: Self-pay | Admitting: Internal Medicine

## 2018-05-12 ENCOUNTER — Other Ambulatory Visit: Payer: Self-pay | Admitting: Emergency Medicine

## 2018-05-12 ENCOUNTER — Ambulatory Visit: Payer: Medicare Other | Admitting: Internal Medicine

## 2018-05-12 ENCOUNTER — Other Ambulatory Visit (INDEPENDENT_AMBULATORY_CARE_PROVIDER_SITE_OTHER): Payer: Medicare Other

## 2018-05-12 VITALS — BP 126/82 | HR 89 | Temp 97.8°F | Resp 16 | Wt 256.0 lb

## 2018-05-12 DIAGNOSIS — I10 Essential (primary) hypertension: Secondary | ICD-10-CM

## 2018-05-12 DIAGNOSIS — E1165 Type 2 diabetes mellitus with hyperglycemia: Secondary | ICD-10-CM | POA: Diagnosis not present

## 2018-05-12 DIAGNOSIS — E785 Hyperlipidemia, unspecified: Secondary | ICD-10-CM | POA: Diagnosis not present

## 2018-05-12 LAB — COMPREHENSIVE METABOLIC PANEL
ALBUMIN: 4.1 g/dL (ref 3.5–5.2)
ALK PHOS: 72 U/L (ref 39–117)
ALT: 17 U/L (ref 0–35)
AST: 15 U/L (ref 0–37)
BUN: 12 mg/dL (ref 6–23)
CALCIUM: 9.9 mg/dL (ref 8.4–10.5)
CO2: 26 mEq/L (ref 19–32)
Chloride: 101 mEq/L (ref 96–112)
Creatinine, Ser: 0.7 mg/dL (ref 0.40–1.20)
GFR: 115.79 mL/min (ref >60.00)
Glucose, Bld: 148 mg/dL — ABNORMAL HIGH (ref 70–99)
POTASSIUM: 4 meq/L (ref 3.5–5.1)
Sodium: 136 mEq/L (ref 135–145)
TOTAL PROTEIN: 7.5 g/dL (ref 6.0–8.3)
Total Bilirubin: 0.4 mg/dL (ref 0.2–1.2)

## 2018-05-12 LAB — HEMOGLOBIN A1C: HEMOGLOBIN A1C: 7.9 % — AB (ref 4.6–6.5)

## 2018-05-12 MED ORDER — EMPAGLIFLOZIN 25 MG PO TABS: 25.0000 mg | ORAL_TABLET | Freq: Every day | ORAL | 5 refills | Status: DC

## 2018-05-12 NOTE — Assessment & Plan Note (Signed)
BP well controlled Current regimen effective and well tolerated Continue current medications at current doses cmp  

## 2018-05-12 NOTE — Assessment & Plan Note (Signed)
Continue statin. 

## 2018-05-12 NOTE — Assessment & Plan Note (Signed)
A1c, cmp Sugars well controlled at home Work on exercising Continue weight loss Will adjust meds if needed

## 2018-05-14 ENCOUNTER — Other Ambulatory Visit: Payer: Self-pay | Admitting: Internal Medicine

## 2018-05-30 ENCOUNTER — Ambulatory Visit: Payer: Medicare Other | Admitting: Internal Medicine

## 2018-06-03 ENCOUNTER — Encounter: Payer: Self-pay | Admitting: Internal Medicine

## 2018-06-03 MED ORDER — GLIPIZIDE 10 MG PO TABS: 10.0000 mg | ORAL_TABLET | Freq: Every day | ORAL | 0 refills | Status: DC

## 2018-06-16 ENCOUNTER — Other Ambulatory Visit: Payer: Self-pay | Admitting: Internal Medicine

## 2018-06-30 ENCOUNTER — Other Ambulatory Visit: Payer: Self-pay | Admitting: Internal Medicine

## 2018-06-30 ENCOUNTER — Ambulatory Visit: Payer: Medicare Other | Admitting: Internal Medicine

## 2018-06-30 ENCOUNTER — Encounter: Payer: Self-pay | Admitting: Internal Medicine

## 2018-06-30 VITALS — BP 114/82 | HR 102 | Ht 60.0 in | Wt 252.0 lb

## 2018-06-30 DIAGNOSIS — J9611 Chronic respiratory failure with hypoxia: Secondary | ICD-10-CM | POA: Diagnosis not present

## 2018-06-30 DIAGNOSIS — J8410 Pulmonary fibrosis, unspecified: Secondary | ICD-10-CM

## 2018-06-30 DIAGNOSIS — J309 Allergic rhinitis, unspecified: Secondary | ICD-10-CM

## 2018-06-30 NOTE — Progress Notes (Signed)
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  Since then waxing/waning sx requiring intermittent prednisone tapers with only minimal improvement - most of her symptoms chronically have been related to obesity with dyspnea on exertion and tendency to chronic cough which is felt to be at least partly  reflux related but improved with saba 03/09/2015 so started on dulera 100 2bid in addition to maint pred and seems to have helped but not typically  able to get < 10 mg pred s flare cough/sob     History of Present Illness  November 07, 2010 ov cc cough/ strangling  a bit more with taper off reglan (actually worse before ran out of the low dose reglan)  no excess mucus or increase sob on prednisone @  10 mg daily .  Using med calendar well rec 1)  Ok to resume water aerobics but pace yourself 2)  Wait until your swallowing evaulation is complete before making a decision re reglan > stopped it on her own.  3)  Try Prednisone 10 mg one-half each am   04/04/2012 f/u ov/Glendene Wyer no longer using med calendar on Pred 10 mg one daily  added hyzaar to rx for hbp/leg swelling not back to mall walking yet,  No unusual cough, purulent sputum or sinus/hb symptoms on present rx. No variability to symptoms or need for inhaler. Rec Try prednisone 10 mg one half daily to see if affects your ability to walk the mall on 02 or the cough gets worse (walk the mall first for at least before you try) Always walk for exercise on 3lpm - this will help you burn fat and get into negative calorie balance to lose weight.   03/17/2013 f/u ov/Lowell Makara re NSIP on floor of 10 mg per day Chief Complaint  Patient presents with  . Follow-up    Breathing is overall doing well and she denies any new co's today.   ex on a treadmill x 66m x 3 x daily on 2lpm not the 3lpm as rec >>Prednisone  ceiling is 20 mg per day and floor is 10 mg per day        10/06/2014 f/u ov/Annalyce Lanpher re: pred 10 mg daily after trying 10/5 until Tgiving and worse cough/ sob  Chief Complaint  Patient presents with  . Follow-up    F/U ILD; breathing doing fine; no complaints  Not doing treadmill  Any more but Not limited by breathing from desired activities   rec Resume treadmill x 30 min daily minimum If  doing great try to reduce prednisone to 10 mg alternating with 5 mg daily as your new floor but increase back to ceiling of 20 mg per day if needed     03/09/2015 f/u ov/Yared Susan re: NSIP  pred 10 mg daily could not tol 10/5  Chief Complaint  Patient presents with  . Follow-up    PFT done today. Pt states her breathing is overall doing well, unless she gets exposed to hot/humid weather. She is taking 10 mg pred daily.   2lpm sleep/ 3 lpm ex , flare of cough with < 10 mg per day pred, better cough p albuterol  rec Work on inhaler technique:    Try dulera 100 Take 2 puffs first thing in am and  then another 2 puffs about 12 hours later> helped some        02/06/2016  f/u ov/Sherrika Weakland re: NSIP / 3lpm with ex/ duler 100 2bid and pred 10 mg daily  Chief Complaint  Patient presents with  . Follow-up    Pt states that she did have some issues with allergies but is improving. Pt also was having issues with Dulera, jittery after use, but has started rinsing her mouth after use and side effects have improved. Pt denies cough/wheeze/SOB/CP/tightness.   limited by L knee from doing treadmill, has trainer but not losing wt  Rhinitis symptoms better on singulair   rec Work on inhaler technique:    Should be able to tolerate dulera 100 Take 2 puffs first thing in am and then another 2 puffs about 12 hours later.  If so, try prednisone 10 - 5  = even days take a half, odd days take half   06/01/2016  f/u ov/Ilay Capshaw re: NSIP  Chief Complaint  Patient presents with  . Follow-up    Breathing is doing well. She denies any new  co's today.   when ex 3lpm and back on treadmill x 25 min more limited by knee 2-3x per week at 54mph flat / minimal dry daytime cough on nexium 20 mg bid ac  rec Work on inhaler technique:   Continue  dulera 100 Take 2 puffs first thing in am and then another 2 puffs about 12 hours later.  try prednisone 10 - 5  = even days take a half, odd days take half   08/31/2016  f/u ov/Anmarie Fukushima re:  NSIP ? Cough variant asthma component maint on dulera 100 2bid  Chief Complaint  Patient presents with  . Follow-up    Increased cough x 2 wks  on treadmill x 3lpm x 25 min  Decreased pred Nov 13th and cough worse starting on 23rd  Cough worse at hs and early in am but non-productive  Nose feels dried out/ clogged up/ saline helps  rec Stay on singulair each evening Try dulera 200 Take 2 puffs first thing in am and then another 2 puffs about 12 hours later to see if helps or not by the time you return  Protonix 40 mg Take 30- 60 min before your first and last meals of the day  Prednisone 10 mg daily until better then 10 alternating with 5 mg daily      NP ov 10/05/16 rec Follow med calendar closely and bring to each visit  Return to Prednisone 10mg  daily  Labs today .  Set up for HRCT Chest    07/12/2017 acute extended ov/Arias Weinert re: cough and "wheeze'  Chief Complaint  Patient presents with  . Acute Visit    went to UC on 07/08/17 with cough and wheezing. She states dxed with URI. She is still coughing with minimal green sputum and wheezing some.   prednisone from 10 mg to 5 mg over months and on 5mg  all summer ok  On dulera 100 Take 2 puffs first thing in am and then another 2 puffs about 12 hours later but inhaler is on zero,  Not using rescue correctly and did not bring it or med calendar as req   Acutely worse 07/04/17 with cough/ wheeze/ nasal congestion and reqported to UC she did not use saba rec 07/08/17 UC: Start using the albuterol HFA 2 puffs every 4 hours for cough and wheeze. Continue  using your other inhalers. Start taking the  prednisone 50 mg x 6 days  Take with food. For drainage recommend taking Allegra or Zyrtec daily for the next few days. For nasal congestion Sudafed PE 10 mg every 4-6 hours. rec  For cough > mucinex dm 1200 mg every 12 hours and use use the flutter valve as needed but if still can't stop coughing then add  Tylenol #3 one every 4 hours as needed Continue prednisone 40 x 2 , 30 x 2, 10 x 2 days, and 5 mg daily THEREAFTER ZPAK    08/26/18 NP / med calendar    11/26/2017  f/u ov/Kasen Sako re:   NSIP/ cough variant asthma/MO / 02 2lpm at bedtime/ 3lpm ex  Doing better with med calendar/action plan Chief Complaint  Patient presents with  . Follow-up    Breathing is doing well. She has only used rescue inhaler x 2 since the last visit.   Dyspnea:  Recumbent bicycle x 20 min / walk 2.6 mph flat on 3lpm low 90's on pred 5 mg  Cough: no need for any cough suppression Sleep: at 30 degrees on 2lpm/ no am ha ra SABA use:  Rarely need any saba  rec Ok when return from [place] to reduce symbicort 80 to 1-2 every 12 hours  If worsen, double the prednsione dose until better then back to 5 mg daily  Work on inhaler technique.     02/25/2018  f/u ov/Brionna Romanek re: NSIP / pred dep/  MO / 02 dep hs and ex but not with adls  Chief Complaint  Patient presents with  . Follow-up    Breathing is overall doing well. She c/o sneezing and PND.  She is using her albuterol inhaler 2 x per wk on average.    Dyspnea:  Ex:  Treadmill x 3 lpm x 3x per week x 2.5 mph x 30 min flat sob not oob Cough: none though has pnds controls with zyrtec or  Sleep: on 2lpm with lots of pillows but bed is flat  SABA use:  As above  rec Keep up the good work on the treadmill - always push to where you a little short of breath but never out of breath and assure that your 02 sats is above 90% while exercising. See calendar for specific medication instructions Late add:  Inquire re rheum f/u next ov      06/30/2018  f/u ov/Rual Vermeer re:  NSIP / ? RA vs fibromyalgia f/u by Ursula Alert  Chief Complaint  Patient presents with  . Follow-up    Breathing is doing well. She has not needed her albuterol inhaler.   Dyspnea:  Very limited activity since shingles/ sats 95%  Cough: only with deep insp/ dry Sleeping: on side/ 2 pillows / flat bed  SABA use: none 02: 2lpm hs and as needed daytime     No obvious day to day or daytime variability or assoc excess/ purulent sputum or mucus plugs or hemoptysis or cp or chest tightness, subjective wheeze or overt sinus or hb symptoms.   Sleeping as above  without nocturnal  or early am exacerbation  of respiratory  c/o's or need for noct saba. Also denies any obvious fluctuation of symptoms with weather or environmental changes or other aggravating or alleviating factors except as outlined above   No unusual exposure hx or h/o childhood pna/ asthma or knowledge of premature birth.  Current Allergies, Complete Past Medical History, Past Surgical History, Family History, and Social History were reviewed in [employer]  medical record.  ROS  The following are not active complaints unless bolded Hoarseness, sore throat, dysphagia, dental problems, itching, sneezing,  nasal congestion or discharge of excess mucus or purulent secretions, ear ache,   fever, chills, sweats, unintended wt loss or wt gain, classically pleuritic or exertional cp,  orthopnea pnd or arm/hand swelling  or leg swelling, presyncope, palpitations, abdominal pain, anorexia, nausea, vomiting, diarrhea  or change in bowel habits or change in bladder habits, change in stools or change in urine, dysuria, hematuria,  rash, arthralgias, visual complaints, headache, numbness, weakness or ataxia or problems with walking or coordination,  change in mood or  memory.        Current Meds  Medication Sig  . acetaminophen-codeine (TYLENOL #3) 300-30 MG tablet One every 4 hours as needed for  cough  . albuterol (PROVENTIL HFA;VENTOLIN HFA) 108 (90 Base) MCG/ACT inhaler Inhale 2 puffs into the lungs every 4 (four) hours as needed for wheezing or shortness of breath.  Marland Kitchen aspirin 81 MG chewable tablet Chew 81 mg by mouth daily.  Marland Kitchen atorvastatin (LIPITOR) 20 MG tablet TAKE ONE TABLET BY MOUTH DAILY  . B COMPLEX VITAMINS SL Place 1 mL under the tongue 4 (four) times a week.  . budesonide-formoterol (SYMBICORT) 80-4.5 MCG/ACT inhaler Inhale 2 puffs into the lungs 2 (two) times daily.  . calcium carbonate (CALCIUM 600) 600 MG TABS tablet Take 600 mg by mouth daily with breakfast.  . Diclofenac Sodium (PENNSAID) 2 % SOLN Place 2 g onto the skin 2 (two) times daily.  . empagliflozin (JARDIANCE) 25 MG TABS tablet Take 25 mg by mouth daily.  . ferrous sulfate 325 (65 FE) MG EC tablet Take 325 mg by mouth daily.  . fluticasone (FLONASE) 50 MCG/ACT nasal spray Place 2 sprays into both nostrils daily as needed for allergies or rhinitis.  Marland Kitchen glipiZIDE (GLUCOTROL) 10 MG tablet Take 1 tablet (10 mg total) by mouth daily before breakfast.  . glucose blood (ONE TOUCH ULTRA TEST) test strip USE TO CHECK BLOOD SUGARS TWO TIMES A DAY  . loratadine (CLARITIN) 10 MG tablet Take 10 mg by mouth daily.   Marland Kitchen losartan (COZAAR) 25 MG tablet TAKE ONE-HALF TABLET BY MOUTH DAILY  . montelukast (SINGULAIR) 10 MG tablet Take 1 tablet (10 mg total) by mouth at bedtime.  . OXYGEN 2lpm with sleep and 3lpm with exertion  . pantoprazole (PROTONIX) 40 MG tablet Take 1 tablet (40 mg total) by mouth 2 (two) times daily before a meal.  . predniSONE (DELTASONE) 5 MG tablet Take 5 mg daily by mouth.  . Respiratory Therapy Supplies (FLUTTER) DEVI Use as directed  . SitaGLIPtin-MetFORMIN HCl (JANUMET XR) [phone] MG TB24 Take 1 tablet by mouth daily.  . TRULICITY 1.5 ZO/1.0RU SOPN INJECT 1.5 MG UNDER THE SKIN ONCE WEEKLY  . Turmeric 500 MG CAPS Take 500 mg by mouth daily.             Past Medical History: GASTROESOPHAGEAL  REFLUX DISEASE     - Tapered reglan to 10 mg one half twice daily June 22, 2010 > d/c'd 11/2010  -restarted reglan 03/2011 >>unable to tolerate.  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)   -Restart Prednisone 02/24/08   -PFT's December 23, 2008 VC 35%  DLC0 31%   -Desats with > 2 laps 06/22/10 so rec wear 02 2lpm at bedtime and with ex  MORBID OBESITY   - Target wt  =  153  for BMI <  30  Hypertension Health Maintenance..........................................................Marland KitchenMarland KitchenLeschber    - Pneumovax July 22, 2009      - Flu 06/21/2011           Objective:   Physical Exam  amb bf nad still with urge to cough with deep breath  Vital signs reviewed - Note on arrival 02 sats  98% on RA       wt  305 May 28, 2008 >   302 November 07, 2010 >   303 03/22/2011 > 10/25/2011  299 > 311 04/04/2012 > 306 03/17/2013 >06/15/2013 303 > 10/06/2013 298  > 02/08/2014 289 > 10/06/2014   288 > 03/09/2015 283 > 10/21/2015    283 >  02/06/2016  284 > 06/01/2016  282 > 08/31/2016   281 > 07/12/2017   270 > 11/26/2017   263> 06/30/2018   252    HEENT: nl dentition, turbinates bilaterally, and oropharynx. Nl external ear canals without cough reflex   NECK :  without JVD/Nodes/TM/ nl carotid upstrokes bilaterally   LUNGS: no acc muscle use,  Nl contour chest with distant late insp crackles   bilaterally with  cough on insp   maneuvers   CV:  RRR  no s3 or murmur or increase in P2, and no edema   ABD:  Quite obese/ soft and nontender with nl inspiratory excursion in the supine position. No bruits or organomegaly appreciated, bowel sounds nl  MS:  Nl gait/ ext warm without deformities, calf tenderness, cyanosis or clubbing No obvious joint restrictions   SKIN: warm and dry without lesions    NEURO:  alert, approp, nl sensorium with  no motor or cerebellar deficits apparent.            Assessment & Plan:

## 2018-06-30 NOTE — Assessment & Plan Note (Signed)
Mild flare on flonase daily >  I emphasized that nasal steroids have no immediate benefit in terms of improving symptoms.  To help them reached the target tissue, the patient should use Afrin two puffs every 12 hours applied one min before using the nasal steroids.  Afrin should be stopped after no more than 5 days.  If the symptoms worsen, Afrin can be restarted after 5 days off of therapy to prevent rebound congestion from overuse of Afrin.  I also emphasized that in no way are nasal steroids a concern in terms of "addiction".

## 2018-06-30 NOTE — Patient Instructions (Addendum)
No change in treatment  - follow med calendar as written  See Tammy NP in 6 months with all your medications, even over the counter meds, separated in two separate bags, the ones you take no matter what vs the ones you stop once you feel better and take only as needed when you feel you need them.   Tammy  will generate for you a new user friendly medication calendar that will put [place] all on the same page re: your medication use.     Without this process, it simply isn't possible to assure that we are providing  your outpatient care  with  the attention to detail we feel you deserve.   If we cannot assure that you're getting that kind of care,  then we cannot manage your problem effectively from this clinic.  Once you have seen Tammy and we are sure that we're all on the same page with your medication use she will arrange follow up with me.

## 2018-06-30 NOTE — Assessment & Plan Note (Signed)
rx = 2lpm hs and prn with activity up to 3lpm   Adequate control on present rx, reviewed in detail with pt > no change in rx needed   

## 2018-06-30 NOTE — Assessment & Plan Note (Addendum)
Body mass index is 49.22 kg/m.  -  trending 252  Lab Results  Component Value Date   TSH 1.25 05/09/2017     Contributing to gerd risk/ doe/reviewed the need and the process to achieve and maintain neg calorie balance > defer f/u primary care including intermittently monitoring thyroid status      I had an extended discussion with the patient reviewing all relevant studies completed to date and  lasting 15 to 20 minutes of a 25 minute visit    Each maintenance medication was reviewed in detail including most importantly the difference between maintenance and prns and under what circumstances the prns are to be triggered using an action plan format that is not reflected in the computer generated alphabetically organized AVS but trather by a customized med calendar that reflects the AVS meds with confirmed 100% correlation.   In addition, Please see AVS for unique instructions that I personally wrote and verbalized to the the pt in detail and then reviewed with pt  by my nurse highlighting any  changes in therapy recommended at today's visit to their plan of care.     F/u in 3 months for new med calendar/ confirm accurate med reconciliation

## 2018-06-30 NOTE — Assessment & Plan Note (Signed)
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)   -Restarted Prednisone 02/24/08   -PFT's December 23, 2008 VC 35%  DLC0 31%   -PFT's 02/08/2014   VC 1.30 (49%) and DLCO is 70%  > rec pred 10/5 > did not tolerate - PFTs  03/09/2015     VC 1.27 (48%) and dlco is 72% @ 10 mg daily  - 02/06/2016 try 10 a/w 5 mg daily > did not try - 06/01/2016 try 10 a/w 5 daily > cough  flared p 1 week on lower dose  -ANA positive 10/2016 >refer to rheumatology  -HRCT chest essentially stable changes since 2005 10/09/2016 >  Rheum eval 12/03/16 [facility]  And f/u q 3 mo as of 06/30/2018     The goal with a chronic steroid dependent illness is always arriving at the lowest effective dose that controls the disease/symptoms and not accepting a set "formula" which is based on statistics or guidelines that don't always take into account patient  variability or the natural hx of the dz in every individual patient, which may well vary over time.  For now therefore I recommend the patient maintain  5 mg floor and 10 mg ceiling until / unless changed by rheum

## 2018-07-30 ENCOUNTER — Ambulatory Visit: Payer: Medicare Other | Admitting: Family Medicine

## 2018-07-30 ENCOUNTER — Encounter: Payer: Self-pay | Admitting: Family Medicine

## 2018-07-30 ENCOUNTER — Ambulatory Visit: Payer: Self-pay | Admitting: *Deleted

## 2018-07-30 VITALS — BP 120/88 | HR 89 | Temp 98.1°F | Ht 60.0 in | Wt 246.6 lb

## 2018-07-30 DIAGNOSIS — R1030 Lower abdominal pain, unspecified: Secondary | ICD-10-CM

## 2018-07-30 DIAGNOSIS — K5792 Diverticulitis of intestine, part unspecified, without perforation or abscess without bleeding: Secondary | ICD-10-CM | POA: Diagnosis not present

## 2018-07-30 LAB — POCT URINALYSIS DIPSTICK
Bilirubin, UA: NEGATIVE
Glucose, UA: POSITIVE — AB
Ketones, UA: POSITIVE
LEUKOCYTES UA: NEGATIVE
Nitrite, UA: NEGATIVE
PH UA: 5.5 (ref 5.0–8.0)
PROTEIN UA: NEGATIVE
RBC UA: NEGATIVE
Spec Grav, UA: 1.02 (ref 1.010–1.025)
UROBILINOGEN UA: 0.2 U/dL

## 2018-07-30 LAB — COMPREHENSIVE METABOLIC PANEL
ALK PHOS: 68 U/L (ref 39–117)
ALT: 12 U/L (ref 0–35)
AST: 11 U/L (ref 0–37)
Albumin: 4.1 g/dL (ref 3.5–5.2)
BILIRUBIN TOTAL: 0.6 mg/dL (ref 0.2–1.2)
BUN: 11 mg/dL (ref 6–23)
CALCIUM: 9.8 mg/dL (ref 8.4–10.5)
CO2: 28 meq/L (ref 19–32)
Chloride: 101 mEq/L (ref 96–112)
Creatinine, Ser: 0.6 mg/dL (ref 0.40–1.20)
GFR: 138.21 mL/min (ref >60.00)
Glucose, Bld: 124 mg/dL — ABNORMAL HIGH (ref 70–99)
POTASSIUM: 4 meq/L (ref 3.5–5.1)
Sodium: 139 mEq/L (ref 135–145)
Total Protein: 7.7 g/dL (ref 6.0–8.3)

## 2018-07-30 LAB — CBC
HEMATOCRIT: 41.1 % (ref 36.0–46.0)
HEMOGLOBIN: 13.4 g/dL (ref 12.0–15.0)
MCHC: 32.5 g/dL (ref 30.0–36.0)
MCV: 91 fl (ref 78.0–100.0)
PLATELETS: 269 10*3/uL (ref 150.0–400.0)
RBC: 4.52 Mil/uL (ref 3.87–5.11)
RDW: 15.9 % — ABNORMAL HIGH (ref 11.5–15.5)
WBC: 10 10*3/uL (ref 4.0–10.5)

## 2018-07-30 MED ORDER — FLUCONAZOLE 150 MG PO TABS: 150.0000 mg | ORAL_TABLET | Freq: Once | ORAL | 0 refills | Status: AC

## 2018-07-30 MED ORDER — CIPROFLOXACIN HCL 500 MG PO TABS: 500.0000 mg | ORAL_TABLET | Freq: Two times a day (BID) | ORAL | 0 refills | Status: DC

## 2018-07-30 MED ORDER — METRONIDAZOLE 500 MG PO TABS: 500.0000 mg | ORAL_TABLET | Freq: Three times a day (TID) | ORAL | 0 refills | Status: DC

## 2018-07-30 NOTE — Telephone Encounter (Signed)
Pt called with complaints of abdominal discomfort/ achy pain which started on 07/27/18; the pt says that the pain can be sharp at times; she said that it started as fullness; the pain started on 07/28/18 and is intermittently severe; the pain is at the level of her belly button down; she said that she had a small bowel movement and had no relief in pain; she also took mirelax after that; she has also take phazyme and tylenol for pain; the pt also that she has a UTI test strip at home which was negative; the pt has nausea that worsens when she tries to eat, and she has not had a full meal since 07/27/18; recommendations made per nurse triage protocol; the pt normally sees Dr Quay Burow, [place], but she does not have any availability today; spoke with Tammy and no other provider has availability today; pt is agreeable to be seen in another office; the pt is offered and accepted appointment with Dr Bryan Lemma. [facility], 07/30/18 at 1330; she verbalized understanding; will route to office for notification for this upcoming appointment.  Reason for Disposition . [1] MODERATE pain (e.g., interferes with normal activities) AND [2] pain comes and goes (cramps) AND [3] present > 24 hours  (Exception: pain with Vomiting or Diarrhea - see that Guideline)  Answer Assessment - Initial Assessment Questions 1. LOCATION: "Where does it hurt?"      Bottom of abdomen 2. RADIATION: "Does the pain shoot anywhere else?" (e.g., chest, back)   back 3. ONSET: "When did the pain begin?" (e.g., minutes, hours or days ago)      20/28/19 4. SUDDEN: "Gradual or sudden onset?"     gradually 5. PATTERN "Does the pain come and go, or is it constant?"    - If constant: "Is it getting better, staying the same, or worsening?"      (Note: Constant means the pain never goes away completely; most serious pain is constant and it progresses)     - If intermittent: "How long does it last?" "Do you have pain now?"     (Note: Intermittent  means the pain goes away completely between bouts)     Constant but more severe at times 6. SEVERITY: "How bad is the pain?"  (e.g., Scale 1-10; mild, moderate, or severe)   - MILD (1-3): doesn't interfere with normal activities, abdomen soft and not tender to touch    - MODERATE (4-7): interferes with normal activities or awakens from sleep, tender to touch    - SEVERE (8-10): excruciating pain, doubled over, unable to do any normal activities      Rated 5 out of 10; feels like she can not take a deep breath 7. RECURRENT SYMPTOM: "Have you ever had this type of abdominal pain before?" If so, ask: "When was the last time?" and "What happened that     time?"      Yes. Thinks it was when she had a UTI 8. CAUSE: "What do you think is causing the abdominal pain?"     Not sure; wonders if she is having a reaction to eggs 9. RELIEVING/AGGRAVATING FACTORS: "What makes it better or worse?" (e.g., movement, antacids, bowel movement)     Tylenol and phazyme make it better 10. OTHER SYMPTOMS: "Has there been any vomiting, diarrhea, constipation, or urine problems?"  burping, flatus 11. PREGNANCY: "Is there any chance you are pregnant?" "When was your last menstrual period?"       No hysterectomy  Protocols used: ABDOMINAL  PA[facility]
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
## 2018-08-03 ENCOUNTER — Other Ambulatory Visit: Payer: Self-pay | Admitting: Internal Medicine

## 2018-08-11 ENCOUNTER — Ambulatory Visit: Payer: Medicare Other | Admitting: Internal Medicine

## 2018-08-12 ENCOUNTER — Ambulatory Visit: Payer: Medicare Other | Admitting: Internal Medicine

## 2018-09-02 ENCOUNTER — Ambulatory Visit: Payer: Medicare Other | Admitting: Internal Medicine

## 2018-09-03 ENCOUNTER — Encounter: Payer: Self-pay | Admitting: Adult Health

## 2018-09-03 ENCOUNTER — Ambulatory Visit (INDEPENDENT_AMBULATORY_CARE_PROVIDER_SITE_OTHER): Payer: Medicare Other | Admitting: Adult Health

## 2018-09-03 VITALS — BP 132/80 | HR 77 | Ht 60.0 in | Wt 249.0 lb

## 2018-09-03 DIAGNOSIS — J8410 Pulmonary fibrosis, unspecified: Secondary | ICD-10-CM

## 2018-09-03 DIAGNOSIS — J9611 Chronic respiratory failure with hypoxia: Secondary | ICD-10-CM | POA: Diagnosis not present

## 2018-09-03 DIAGNOSIS — Z23 Encounter for immunization: Secondary | ICD-10-CM | POA: Diagnosis not present

## 2018-09-03 NOTE — Patient Instructions (Signed)
Continue on current regimen.  Follow med calendar closely and bring to each visit  Follow up Dr. Melvyn Novas  In 3 months with PFT and As needed

## 2018-09-03 NOTE — Progress Notes (Signed)
@Patient  ID: [name], female    DOB: [birth year], 47 y.o.   MRN: [id number]  Chief Complaint  Patient presents with  . 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 Tx intermittently with steroids  Medical hx significant for DM , HTN    TEST/EVENTS :  VATS bx 02/15/2004 NSIP ([place] reviewed/confirmed) -Restarted Prednisone 02/24/08 -PFT's December 23, 2008 VC 35% DLC0 31% -PFT's 5/11/2015VC 1.30 (49%) and DLCO is 70% >rec pred 10/5 >did not tolerate - PFTs 6/8/2016VC 1.27 (48%) and dlco is 72% @ 10 mg daily  - 5/8/2017try 10 a/w 5 mg daily >did not try - 9/1/2017try 10 a/w 5 daily >cough flared p 1 week on lower dose  - 04/05/2012 Walked 2lpm x 3 laps @ 185 ft each stopped due to desat corrected on 3lpm so rec 3lpm with exertion - 10/06/2013 Walked RA x 2 laps @ 185 ft each stopped due to Cough and desat to 86% at more rapid pace than usual and on 3lpm could do 3 laps s difficulty  09/02/2016 = no 02 at rest, 3lpm with exertion, 2lpm at hs  10/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 Trudie Reed / Marella Chimes PA - Tx with /OA Polyarthralgia/Fibromyalgia . (Rheumatology notes indicate no evidence of Autoimmune/CTD - Negative dsDNA, SSA, SSB, RNP, SCL-70, RF   09/03/2018 Follow up : ILD/NSIP , O2 RF  Patient presents for a 93-month follow-up.  She has underlying ILD dating back to 2005.  Previous open lung biopsy consistent with NSIP.  Most recent high-resolution CT chest 2018 showed no significant change since 2005 and ILD.  patient says overall her breathing has been doing well.  Had no flare of cough wheezing or increased shortness of breath.  She is trying to be more active.  She is using oxygen 3 L with exercise and 2 L at bedtime.  At rest does  not need oxygen.   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  Patient has cough variant asthma and allergic rhinitis.  She remains on Symbicort, Flonase and Claritin.  Says is been well controlled without any increased wheezing.  No increased albuterol use.    Reviewed med calendar with pt education and updated.   Allergies  Allergen Reactions  . Penicillins Shortness Of Breath, Swelling and Other (See Comments)    Has patient had a PCN reaction causing immediate rash, facial/tongue/throat swelling, SOB or lightheadedness with hypotension: yes Has patient had a PCN reaction causing severe rash involving mucus membranes or skin necrosis: no Has patient had a PCN reaction that required hospitalization no Has patient had a PCN reaction occurring within the last 10 years: no If all of the above answers are "NO", then may proceed with Cephalosporin use.   Marland Kitchen Peach Flavor Hives and Other (See Comments)    Fresh peaches only  . Levaquin [Levofloxacin] Nausea Only    Nausea, bloating    Immunization History  Administered Date(s) Administered  . Influenza Split 06/21/2011, 07/29/2012  . Influenza Whole 07/22/2009  . Influenza,inj,Quad PF,6+ Mos 06/15/2013, 08/19/2014, 09/19/2015, 08/31/2016, 08/09/2017  . Pneumococcal Conjugate-13 09/19/2015  . Pneumococcal Polysaccharide-23 07/22/2009  . Td 03/24/2009    Past Medical History:  Diagnosis Date  . Allergic  rhinitis   . Bronchitis   . Chronic rhinitis   . Cough   . Dyslipidemia   . Dysphagia   . GERD (gastroesophageal reflux disease)   . Headache(784.0)   . ILD (interstitial lung disease) ([place])   . Mild depression ([place])   . Morbid obesity ([place])   . Type II or diabetes mellitus, uncontrolled 11/2012 dx   Dx 12/08/12: a1c 10.0  . Unspecified essential hypertension   . Urge incontinence     Tobacco History: Social History   Tobacco Use  Smoking Status Never Smoker  Smokeless Tobacco Never Used  Tobacco Comment    {   Counseling given: Not Answered Comment: {   Outpatient Medications Prior to Visit  Medication Sig Dispense Refill  . acetaminophen-codeine (TYLENOL #3) 300-30 MG tablet One every 4 hours as needed for cough 40 tablet 0  . albuterol (PROVENTIL HFA;VENTOLIN HFA) 108 (90 Base) MCG/ACT inhaler Inhale 2 puffs into the lungs every 4 (four) hours as needed for wheezing or shortness of breath. 1 Inhaler 0  . aspirin 81 MG chewable tablet Chew 81 mg by mouth daily.    Marland Kitchen atorvastatin (LIPITOR) 20 MG tablet TAKE ONE TABLET BY MOUTH DAILY 90 tablet 0  . B COMPLEX VITAMINS SL Place 1 mL under the tongue 4 (four) times a week.    . budesonide-formoterol (SYMBICORT) 80-4.5 MCG/ACT inhaler Inhale 2 puffs into the lungs 2 (two) times daily. 1 Inhaler 0  . calcium carbonate (CALCIUM 600) 600 MG TABS tablet Take 600 mg by mouth daily with breakfast.    . ciprofloxacin (CIPRO) 500 MG tablet Take 1 tablet (500 mg total) by mouth 2 (two) times daily. 20 tablet 0  . Diclofenac Sodium (PENNSAID) 2 % SOLN Place 2 g onto the skin 2 (two) times daily. 112 g 3  . empagliflozin (JARDIANCE) 25 MG TABS tablet Take 25 mg by mouth daily. 30 tablet 5  . ferrous sulfate 325 (65 FE) MG EC tablet Take 325 mg by mouth daily.    . fluticasone (FLONASE) 50 MCG/ACT nasal spray Place 2 sprays into both nostrils daily as needed for allergies or rhinitis.    Marland Kitchen glucose blood (ONE TOUCH ULTRA TEST) test strip USE TO CHECK BLOOD SUGARS TWO TIMES A DAY 100 each 3  . JANUMET XR [phone] MG TB24 TAKE ONE TABLET BY MOUTH DAILY 30 tablet 0  . loratadine (CLARITIN) 10 MG tablet Take 10 mg by mouth daily.     Marland Kitchen losartan (COZAAR) 25 MG tablet TAKE ONE-HALF TABLET BY MOUTH DAILY 45 tablet 3  . metroNIDAZOLE (FLAGYL) 500 MG tablet Take 1 tablet (500 mg total) by mouth 3 (three) times daily. 30 tablet 0  . mometasone-formoterol (DULERA) 100-5 MCG/ACT AERO Take 2 puffs first thing in am and then another 2 puffs about 12 hours later. (Patient not  taking: Reported on 06/30/2018) 1 Inhaler 11  . montelukast (SINGULAIR) 10 MG tablet Take 1 tablet (10 mg total) by mouth at bedtime. 30 tablet 5  . OXYGEN 2lpm with sleep and 3lpm with exertion    . pantoprazole (PROTONIX) 40 MG tablet TAKE ONE TABLET BY MOUTH TWICE A DAY BEFORE MEALS (Patient not taking: Reported on 07/30/2018) 60 tablet 5  . predniSONE (DELTASONE) 5 MG tablet Take 5 mg daily by mouth.    . Respiratory Therapy Supplies (FLUTTER) DEVI Use as directed 1 each 0  . TRULICITY 1.5 GG/2.6RS SOPN INJECT 1.5 MG UNDER THE SKIN ONCE WEEKLY 12 pen 1  .  Turmeric 500 MG CAPS Take 500 mg by mouth daily.     No facility-administered medications prior to visit.      Review of Systems  Constitutional:   No  weight loss, night sweats,  Fevers, chills,  +fatigue, or  lassitude.  HEENT:   No headaches,  Difficulty swallowing,  Tooth/dental problems, or  Sore throat,                No sneezing, itching, ear ache, nasal congestion, post nasal drip,   CV:  No chest pain,  Orthopnea, PND, swelling in lower extremities, anasarca, dizziness, palpitations, syncope.   GI  No heartburn, indigestion, abdominal pain, nausea, vomiting, diarrhea, change in bowel habits, loss of appetite, bloody stools.   Resp:  .  No excess mucus, no productive cough,  No non-productive cough,  No coughing up of blood.  No change in color of mucus.  No wheezing.  No chest wall deformity  Skin: no rash or lesions.  GU: no dysuria, change in color of urine, no urgency or frequency.  No flank pain, no hematuria   MS:  No joint pain or swelling.  No decreased range of motion.  No back pain.    Physical Exam  GEN: A/Ox3; pleasant , NAD,  Obese    HEENT:  [place]/AT,  EACs-clear, TMs-wnl, NOSE-clear, THROAT-clear, no lesions, no postnasal drip or exudate noted.   NECK:  Supple w/ fair ROM; no JVD; normal carotid impulses w/o bruits; no thyromegaly or nodules palpated; no lymphadenopathy.    RESP  Clear  P & A; w/o,  wheezes/ rales/ or rhonchi. no accessory muscle use, no dullness to percussion  CARD:  RRR, no m/r/g, no peripheral edema, pulses intact, no cyanosis or clubbing.  GI:   Soft & nt; nml bowel sounds; no organomegaly or masses detected.   Musco: Warm bil, no deformities or joint swelling noted.   Neuro: alert, no focal deficits noted.    Skin: Warm, no lesions or rashes    Lab Results:  CBC  BNP  Imaging: No results found.    PFT Results Latest Ref Rng & Units 03/09/2015 02/08/2014  FVC-Pre L 1.21 1.19  FVC-Predicted Pre % 46 45  FVC-Post L 1.26 1.17  FVC-Predicted Post % 48 44  Pre FEV1/FVC % % 83 85  Post FEV1/FCV % % 92 90  FEV1-Pre L 1.01 1.01  FEV1-Predicted Pre % 47 46  FEV1-Post L 1.15 1.06  DLCO UNC% % 72 70  DLCO COR %Predicted % 171 153  TLC L 2.14 2.11  TLC % Predicted % 48 47  RV % Predicted % 60 57    No results found for: NITRICOXIDE      Assessment & Plan:   No problem-specific Assessment & Plan notes found for this encounter.     Rexene Edison, NP 09/03/2018

## 2018-09-04 NOTE — Assessment & Plan Note (Signed)
Appears currently stable  Will check PFT w/ DLCO on return  Pending PFT results may want to refer to ILD clinic program  at [facility] with Dr. Chase Caller .   Plan  Patient Instructions  Continue on current regimen.  Follow med calendar closely and bring to each visit  Follow up Dr. Melvyn Novas  In 3 months with PFT and As needed

## 2018-09-04 NOTE — Assessment & Plan Note (Signed)
Cont on O2 with act and At bedtime   

## 2018-09-14 ENCOUNTER — Other Ambulatory Visit: Payer: Self-pay | Admitting: Internal Medicine

## 2018-10-14 ENCOUNTER — Other Ambulatory Visit: Payer: Self-pay | Admitting: Internal Medicine

## 2018-10-16 NOTE — Patient Instructions (Signed)
  Tests ordered today. Your results will be released to MyChart (or called to you) after review, usually within 72hours after test completion. If any changes need to be made, you will be notified at that same time.  All other Health Maintenance issues reviewed.   All recommended immunizations and age-appropriate screenings are up-to-date or discussed.  No immunizations administered today.   Medications reviewed and updated.  Changes include :     Your prescription(s) have been submitted to your pharmacy. Please take as directed and contact our office if you believe you are having problem(s) with the medication(s).  A referral was ordered for   Please followup in    

## 2018-10-16 NOTE — Progress Notes (Signed)
Subjective:    Patient ID: [name], female    DOB: 1971/08/08, 48 y.o.   MRN: [id number]  HPI The patient is here for follow up.      Medications and allergies reviewed with patient and updated if appropriate.  Patient Active Problem List   Diagnosis Date Noted  . Anxiety 02/05/2018  . Peroneal tendinitis of lower leg, right 10/22/2017  . Chronic respiratory failure with hypoxia ([place]) 07/20/2017  . Knee pain, left 02/02/2016  . Cough variant asthma 03/09/2015  . Diabetes type 2, uncontrolled ([place]) 12/09/2012  . Dyslipidemia   . Acute on chronic respiratory failure with hypoxia ([place]) 04/05/2012  . CONJUNCTIVITIS, ALLERGIC, SEASONAL 01/05/2010  . Allergic rhinitis 01/05/2010  . Essential hypertension 09/07/2009  . URINARY INCONTINENCE, URGE 03/24/2009  . CHRONIC RHINITIS 12/23/2008  . Morbid obesity due to excess calories ([place]) 10/10/2007  . INTERSTITIAL LUNG DISEASE 10/10/2007  . GASTROESOPHAGEAL REFLUX DISEASE 10/10/2007    Current Outpatient Medications on File Prior to Visit  Medication Sig Dispense Refill  . acetaminophen-codeine (TYLENOL #3) 300-30 MG tablet One every 4 hours as needed for cough 40 tablet 0  . albuterol (PROVENTIL HFA;VENTOLIN HFA) 108 (90 Base) MCG/ACT inhaler Inhale 2 puffs into the lungs every 4 (four) hours as needed for wheezing or shortness of breath. 1 Inhaler 0  . aspirin 81 MG chewable tablet Chew 81 mg by mouth daily.    Marland Kitchen atorvastatin (LIPITOR) 20 MG tablet TAKE ONE TABLET BY MOUTH DAILY 90 tablet 0  . B COMPLEX VITAMINS SL Place 1 mL under the tongue 4 (four) times a week.    . budesonide-formoterol (SYMBICORT) 80-4.5 MCG/ACT inhaler Inhale 2 puffs into the lungs 2 (two) times daily. 1 Inhaler 0  . calcium carbonate (CALCIUM 600) 600 MG TABS tablet Take 600 mg by mouth daily with breakfast.    . Dextromethorphan-guaiFENesin (MUCINEX DM MAXIMUM STRENGTH) 60-1200 MG TB12 Take 1 tablet by mouth every 12 (twelve) hours as needed (with  flutter valve).    . Diclofenac Sodium (PENNSAID) 2 % SOLN Place 2 g onto the skin 2 (two) times daily. (Patient not taking: Reported on 09/03/2018) 112 g 3  . empagliflozin (JARDIANCE) 25 MG TABS tablet Take 1 tablet in AM with or without food -- Office visit needed for further refills 90 tablet 0  . ferrous sulfate 325 (65 FE) MG EC tablet Take 325 mg by mouth daily.    . fluticasone (FLONASE) 50 MCG/ACT nasal spray Place 2 sprays into both nostrils daily as needed for allergies or rhinitis.    Marland Kitchen glucose blood (ONE TOUCH ULTRA TEST) test strip USE TO CHECK BLOOD SUGARS TWO TIMES A DAY 100 each 3  . hydrochlorothiazide (MICROZIDE) 12.5 MG capsule Take 12.5 mg by mouth daily as needed.    . loratadine (CLARITIN) 10 MG tablet Take 10 mg by mouth daily as needed (nasal drainage, drip).     Marland Kitchen losartan (COZAAR) 25 MG tablet TAKE ONE-HALF TABLET BY MOUTH DAILY 45 tablet 3  . montelukast (SINGULAIR) 10 MG tablet Take 1 tablet (10 mg total) by mouth at bedtime. 30 tablet 5  . oxybutynin (DITROPAN-XL) 10 MG 24 hr tablet Take 10 mg by mouth at bedtime.    . OXYGEN 2lpm with sleep and 3lpm with exertion    . pantoprazole (PROTONIX) 40 MG tablet TAKE ONE TABLET BY MOUTH TWICE A DAY BEFORE MEALS 60 tablet 5  . predniSONE (DELTASONE) 5 MG tablet Take 5 mg by mouth  daily. May take 2 tabs daily till better then 1 daily as needed    . Respiratory Therapy Supplies (FLUTTER) DEVI Use as directed 1 each 0  . SitaGLIPtin-MetFORMIN HCl (JANUMET XR) [phone] MG TB24 Take 1 tablet by mouth daily. -- Office visit needed for further refills 30 tablet 0  . TRULICITY 1.5 QI/2.9NL SOPN INJECT 1.5 MG UNDER THE SKIN ONCE WEEKLY 12 pen 1  . Turmeric 500 MG CAPS Take 500 mg by mouth daily.    . vitamin C (ASCORBIC ACID) 500 MG tablet Take 500 mg by mouth daily.    . [DISCONTINUED] DETROL LA 2 MG 24 hr capsule TAKE 1 CAPSULE BY MOUTH DAILY 30 capsule 10   No current facility-administered medications on file prior to visit.      Past Medical History:  Diagnosis Date  . Allergic rhinitis   . Bronchitis   . Chronic rhinitis   . Cough   . Dyslipidemia   . Dysphagia   . GERD (gastroesophageal reflux disease)   . Headache(784.0)   . ILD (interstitial lung disease) ([place])   . Mild depression ([place])   . Morbid obesity ([place])   . Type II or diabetes mellitus, uncontrolled 11/2012 dx   Dx 12/08/12: a1c 10.0  . Unspecified essential hypertension   . Urge incontinence     Past Surgical History:  Procedure Laterality Date  . ABDOMINAL HYSTERECTOMY    . BILATERAL VATS ABLATION  02/15/04  . CESAREAN SECTION    . LUNG BIOPSY    . RIGHT HEART CATH N/A 01/30/2017   Procedure: Right Heart Cath;  Surgeon: Larey Dresser, MD;  Location: [facility];  Service: Cardiovascular;  Laterality: N/A;    Social History   Socioeconomic History  . Marital status: Married    Spouse name: Not on file  . Number of children: 1  . Years of education: Not on file  . Highest education level: Not on file  Occupational History  . 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 Financial resource strain: Not hard at all  . Food insecurity:    Worry: Never true    Inability: Never true  . Transportation needs:    Medical: No    Non-medical: No  Tobacco Use  . Smoking status: Never Smoker  . Smokeless tobacco: Never Used  . Tobacco comment: {  Substance and Sexual Activity  . Alcohol use: No    Alcohol/week: 0.0 standard drinks  . Drug use: No  . Sexual activity: Never  Lifestyle  . Physical activity:    Days per week: 3 days    Minutes per session: 40 min  . Stress: To some extent  Relationships  . Social connections:    Talks on phone: More than three times a week    Gets together: More than three times a week    Attends religious service: More than 4 times per year    Active member of club or organization: Not on file    Attends meetings of clubs  or organizations: More than 4 times per year    Relationship status: Married  Other Topics Concern  . Not on file  Social History Narrative  . Not on file    Family History  Problem Relation Age of Onset  . Sarcoidosis Mother        dxed by transbrochial bx  . Allergies Mother   .  Heart disease Father   . Diabetes Father   . Allergies Father   . Coronary artery disease Father   . Cancer Maternal Grandmother        CA of unknown type  . Cancer Paternal Grandmother        CA of unknown type    Review of Systems     Objective:  There were no vitals filed for this visit. BP Readings from Last 3 Encounters:  09/03/18 132/80  07/30/18 120/88  06/30/18 114/82   Wt Readings from Last 3 Encounters:  09/03/18 249 lb (112.9 kg)  07/30/18 246 lb 9.6 oz (111.9 kg)  06/30/18 252 lb (114.3 kg)   There is no height or weight on file to calculate BMI.   Physical Exam        Assessment & Plan:    See Problem List for Assessment and Plan of chronic medical problems.   This encounter was created in error - please disregard.

## 2018-10-17 ENCOUNTER — Encounter: Payer: Medicare Other | Admitting: Internal Medicine

## 2018-10-27 NOTE — Patient Instructions (Addendum)

## 2018-10-27 NOTE — Progress Notes (Signed)
Subjective:    Patient ID: [name], female    DOB: 24-Jan-1971, 48 y.o.   MRN: [id number]  HPI The patient is here for follow up.  Diabetes: She is taking her medication daily as prescribed. She is compliant with a diabetic diet. She is exercising - walking on the treadmill. She monitors her sugars and they have been running 105, 82, typically low 100's. She checks her feet daily and denies foot lesions. She is up-to-date with an ophthalmology examination.   Hypertension: She is taking her medication daily. She is compliant with a low sodium diet.  She denies chest pain, palpitations, edema, shortness of breath and regular headaches. She is exercising.  She does not monitor her blood pressure at home.    Hyperlipidemia: She is taking her medication daily. She is compliant with a low fat/cholesterol diet. She is exercising regularly. She denies myalgias.   Morbid obesity:  She is not exercising regularly.  She is eating a low carb/sugar diet and watching how much she eats.    Asthma:  She states her asthma is well controlled.  She denies fever, cough, wheeze, sob.   Medications and allergies reviewed with patient and updated if appropriate.  Patient Active Problem List   Diagnosis Date Noted  . Anxiety 02/05/2018  . Peroneal tendinitis of lower leg, right 10/22/2017  . Chronic respiratory failure with hypoxia ([place]) 07/20/2017  . Knee pain, left 02/02/2016  . Cough variant asthma 03/09/2015  . Diabetes type 2, uncontrolled ([place]) 12/09/2012  . Dyslipidemia   . Acute on chronic respiratory failure with hypoxia ([place]) 04/05/2012  . CONJUNCTIVITIS, ALLERGIC, SEASONAL 01/05/2010  . Allergic rhinitis 01/05/2010  . Essential hypertension 09/07/2009  . URINARY INCONTINENCE, URGE 03/24/2009  . CHRONIC RHINITIS 12/23/2008  . Morbid obesity due to excess calories ([place]) 10/10/2007  . INTERSTITIAL LUNG DISEASE 10/10/2007  . GASTROESOPHAGEAL REFLUX DISEASE 10/10/2007    Current  Outpatient Medications on File Prior to Visit  Medication Sig Dispense Refill  . acetaminophen-codeine (TYLENOL #3) 300-30 MG tablet One every 4 hours as needed for cough 40 tablet 0  . albuterol (PROVENTIL HFA;VENTOLIN HFA) 108 (90 Base) MCG/ACT inhaler Inhale 2 puffs into the lungs every 4 (four) hours as needed for wheezing or shortness of breath. 1 Inhaler 0  . aspirin 81 MG chewable tablet Chew 81 mg by mouth daily.    Marland Kitchen atorvastatin (LIPITOR) 20 MG tablet TAKE ONE TABLET BY MOUTH DAILY 90 tablet 0  . B COMPLEX VITAMINS SL Place 1 mL under the tongue 4 (four) times a week.    . budesonide-formoterol (SYMBICORT) 80-4.5 MCG/ACT inhaler Inhale 2 puffs into the lungs 2 (two) times daily. 1 Inhaler 0  . calcium carbonate (CALCIUM 600) 600 MG TABS tablet Take 600 mg by mouth daily with breakfast.    . Dextromethorphan-guaiFENesin (MUCINEX DM MAXIMUM STRENGTH) 60-1200 MG TB12 Take 1 tablet by mouth every 12 (twelve) hours as needed (with flutter valve).    . empagliflozin (JARDIANCE) 25 MG TABS tablet Take 1 tablet in AM with or without food -- Office visit needed for further refills 90 tablet 0  . ferrous sulfate 325 (65 FE) MG EC tablet Take 325 mg by mouth daily.    . fluticasone (FLONASE) 50 MCG/ACT nasal spray Place 2 sprays into both nostrils daily as needed for allergies or rhinitis.    Marland Kitchen glucose blood (ONE TOUCH ULTRA TEST) test strip USE TO CHECK BLOOD SUGARS TWO TIMES A DAY 100 each 3  .  hydrochlorothiazide (MICROZIDE) 12.5 MG capsule Take 12.5 mg by mouth daily as needed.    . loratadine (CLARITIN) 10 MG tablet Take 10 mg by mouth daily as needed (nasal drainage, drip).     Marland Kitchen losartan (COZAAR) 25 MG tablet TAKE ONE-HALF TABLET BY MOUTH DAILY 45 tablet 3  . montelukast (SINGULAIR) 10 MG tablet Take 1 tablet (10 mg total) by mouth at bedtime. 30 tablet 5  . OXYGEN 2lpm with sleep and 3lpm with exertion    . pantoprazole (PROTONIX) 40 MG tablet TAKE ONE TABLET BY MOUTH TWICE A DAY BEFORE  MEALS 60 tablet 5  . predniSONE (DELTASONE) 5 MG tablet Take 5 mg by mouth daily. May take 2 tabs daily till better then 1 daily as needed    . Respiratory Therapy Supplies (FLUTTER) DEVI Use as directed 1 each 0  . SitaGLIPtin-MetFORMIN HCl (JANUMET XR) [phone] MG TB24 Take 1 tablet by mouth daily. -- Office visit needed for further refills 30 tablet 0  . TRULICITY 1.5 LK/5.6YB SOPN INJECT 1.5 MG UNDER THE SKIN ONCE WEEKLY 12 pen 1  . Turmeric 500 MG CAPS Take 500 mg by mouth daily.    . vitamin C (ASCORBIC ACID) 500 MG tablet Take 500 mg by mouth daily.    . [DISCONTINUED] DETROL LA 2 MG 24 hr capsule TAKE 1 CAPSULE BY MOUTH DAILY 30 capsule 10   No current facility-administered medications on file prior to visit.     Past Medical History:  Diagnosis Date  . Allergic rhinitis   . Bronchitis   . Chronic rhinitis   . Cough   . Dyslipidemia   . Dysphagia   . GERD (gastroesophageal reflux disease)   . Headache(784.0)   . ILD (interstitial lung disease) ([place])   . Mild depression ([place])   . Morbid obesity ([place])   . Type II or diabetes mellitus, uncontrolled 11/2012 dx   Dx 12/08/12: a1c 10.0  . Unspecified essential hypertension   . Urge incontinence     Past Surgical History:  Procedure Laterality Date  . ABDOMINAL HYSTERECTOMY    . BILATERAL VATS ABLATION  02/15/04  . CESAREAN SECTION    . LUNG BIOPSY    . RIGHT HEART CATH N/A 01/30/2017   Procedure: Right Heart Cath;  Surgeon: Larey Dresser, MD;  Location: [facility];  Service: Cardiovascular;  Laterality: N/A;    Social History   Socioeconomic History  . Marital status: Married    Spouse name: Not on file  . Number of children: 1  . Years of education: Not on file  . Highest education level: Not on file  Occupational History  . 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 Financial resource strain: Not hard at all  . Food insecurity:    Worry:  Never true    Inability: Never true  . Transportation needs:    Medical: No    Non-medical: No  Tobacco Use  . Smoking status: Never Smoker  . Smokeless tobacco: Never Used  . Tobacco comment: {  Substance and Sexual Activity  . Alcohol use: No    Alcohol/week: 0.0 standard drinks  . Drug use: No  . Sexual activity: Never  Lifestyle  . Physical activity:    Days per week: 3 days    Minutes per session: 40 min  . Stress: To some extent  Relationships  . Social connections:  Talks on phone: More than three times a week    Gets together: More than three times a week    Attends religious service: More than 4 times per year    Active member of club or organization: Not on file    Attends meetings of clubs or organizations: More than 4 times per year    Relationship status: Married  Other Topics Concern  . Not on file  Social History Narrative  . Not on file    Family History  Problem Relation Age of Onset  . Sarcoidosis Mother        dxed by transbrochial bx  . Allergies Mother   . Heart disease Father   . Diabetes Father   . Allergies Father   . Coronary artery disease Father   . Cancer Maternal Grandmother        CA of unknown type  . Cancer Paternal Grandmother        CA of unknown type    Review of Systems  Constitutional: Negative for chills and fever.  Respiratory: Negative for cough, shortness of breath and wheezing.   Cardiovascular: Negative for chest pain, palpitations and leg swelling.  Neurological: Negative for light-headedness and headaches.       Objective:   Vitals:   10/28/18 0822  BP: 118/80  Pulse: 87  Resp: 16  Temp: 98.2 F (36.8 C)  SpO2: 98%   BP Readings from Last 3 Encounters:  10/28/18 118/80  09/03/18 132/80  07/30/18 120/88   Wt Readings from Last 3 Encounters:  10/28/18 241 lb (109.3 kg)  09/03/18 249 lb (112.9 kg)  07/30/18 246 lb 9.6 oz (111.9 kg)   Body mass index is 47.07 kg/m.   Physical Exam      Constitutional: Appears well-developed and well-nourished. No distress.  HENT:  Head: Normocephalic and atraumatic.  Neck: Neck supple. No tracheal deviation present. No thyromegaly present.  No cervical lymphadenopathy Cardiovascular: Normal rate, regular rhythm and normal heart sounds.   No murmur heard. No carotid bruit .  No edema Pulmonary/Chest: Effort normal and breath sounds normal. No respiratory distress. No has no wheezes. No rales.  Skin: Skin is warm and dry. Not diaphoretic.  Psychiatric: Normal mood and affect. Behavior is normal.      Assessment & Plan:    See Problem List for Assessment and Plan of chronic medical problems.

## 2018-10-28 ENCOUNTER — Encounter: Payer: Self-pay | Admitting: Internal Medicine

## 2018-10-28 ENCOUNTER — Ambulatory Visit: Payer: Medicare Other | Admitting: Internal Medicine

## 2018-10-28 ENCOUNTER — Other Ambulatory Visit (INDEPENDENT_AMBULATORY_CARE_PROVIDER_SITE_OTHER): Payer: Medicare Other

## 2018-10-28 VITALS — BP 118/80 | HR 87 | Temp 98.2°F | Resp 16 | Ht 60.0 in | Wt 241.0 lb

## 2018-10-28 DIAGNOSIS — E785 Hyperlipidemia, unspecified: Secondary | ICD-10-CM | POA: Diagnosis not present

## 2018-10-28 DIAGNOSIS — I10 Essential (primary) hypertension: Secondary | ICD-10-CM | POA: Diagnosis not present

## 2018-10-28 DIAGNOSIS — E1165 Type 2 diabetes mellitus with hyperglycemia: Secondary | ICD-10-CM

## 2018-10-28 LAB — COMPREHENSIVE METABOLIC PANEL
ALT: 19 U/L (ref 0–35)
AST: 16 U/L (ref 0–37)
Albumin: 4 g/dL (ref 3.5–5.2)
Alkaline Phosphatase: 67 U/L (ref 39–117)
BUN: 11 mg/dL (ref 6–23)
CALCIUM: 9.5 mg/dL (ref 8.4–10.5)
CO2: 27 mEq/L (ref 19–32)
Chloride: 101 mEq/L (ref 96–112)
Creatinine, Ser: 0.59 mg/dL (ref 0.40–1.20)
GFR: 132.44 mL/min (ref >60.00)
Glucose, Bld: 95 mg/dL (ref 70–99)
Potassium: 4.6 mEq/L (ref 3.5–5.1)
Sodium: 137 mEq/L (ref 135–145)
Total Bilirubin: 0.5 mg/dL (ref 0.2–1.2)
Total Protein: 6.9 g/dL (ref 6.0–8.3)

## 2018-10-28 LAB — LIPID PANEL
Cholesterol: 155 mg/dL (ref 0–200)
HDL: 41.9 mg/dL (ref >39.00)
LDL CALC: 101 mg/dL — AB (ref 0–99)
NonHDL: 112.71
Total CHOL/HDL Ratio: 4
Triglycerides: 57 mg/dL (ref 0.0–149.0)
VLDL: 11.4 mg/dL (ref 0.0–40.0)

## 2018-10-28 LAB — HEMOGLOBIN A1C: Hgb A1c MFr Bld: 7.4 % — ABNORMAL HIGH (ref 4.6–6.5)

## 2018-10-28 MED ORDER — SITAGLIP PHOS-METFORMIN HCL ER 100-1000 MG PO TB24: 1.0000 | ORAL_TABLET | Freq: Every day | ORAL | 1 refills | Status: DC

## 2018-10-28 NOTE — Assessment & Plan Note (Signed)
.  sugars well controlled at home Check a1c Low sugar / carb diet Continue regular exercise Continue weight loss efforts

## 2018-10-28 NOTE — Assessment & Plan Note (Signed)
Check lipid panel  Continue daily statin Regular exercise and healthy diet encouraged  

## 2018-10-28 NOTE — Assessment & Plan Note (Signed)
BP well controlled Current regimen effective and well tolerated Continue current medications at current doses Cmp

## 2018-10-28 NOTE — Assessment & Plan Note (Signed)
Working on weight loss Continue regular exercise low sugar/carb diet with small portions

## 2018-11-19 ENCOUNTER — Encounter: Payer: Self-pay | Admitting: Internal Medicine

## 2018-11-19 NOTE — Telephone Encounter (Signed)
Call her - we can prescribe effexor which is for depression and anxiety and xanax to use as needed for anxiety.   Can send in effexor 37.5 mg daily and xanax I can send this weekend   Let me know

## 2018-11-19 NOTE — Telephone Encounter (Signed)
I understand you can not send in any controlled anxiety medications. I did recommend for patient to come in and be seen as well. I did not know if there would be something that you sent in that was not controlled. Please let me know what you prefer pt to do and I will contact her.

## 2018-11-20 MED ORDER — VENLAFAXINE HCL ER 37.5 MG PO CP24: 37.5000 mg | ORAL_CAPSULE | Freq: Every day | ORAL | 2 refills | Status: DC

## 2018-11-20 NOTE — Progress Notes (Deleted)
Subjective:   [name] is a 48 y.o. female who presents for Medicare Annual (Subsequent) preventive examination.  Review of Systems:  No ROS.  Medicare Wellness Visit. Additional risk factors are reflected in the social history.    Sleep patterns: {SX; SLEEP PATTERNS:18802::"feels rested on waking","does not get up to void","gets up *** times nightly to void","sleeps *** hours nightly"}.    Home Safety/Smoke Alarms: Feels safe in home. Smoke alarms in place.  Living environment; residence and Firearm Safety: {Rehab home environment / accessibility:30080::"no firearms","firearms stored safely"}. Seat Belt Safety/Bike Helmet: Wears seat belt.      Objective:     Vitals: There were no vitals taken for this visit.  There is no height or weight on file to calculate BMI.  Advanced Directives 11/08/2017 07/08/2017 05/01/2017 01/30/2017 11/06/2016 08/13/2016 02/05/2016  Does Patient Have a Medical Advance Directive? No No No No No No No  Would patient like information on creating a medical advance directive? Yes (ED - Information included in AVS) - - No - Patient declined No - Patient declined - No - patient declined information    Tobacco Social History   Tobacco Use  Smoking Status Never Smoker  Smokeless Tobacco Never Used  Tobacco Comment   {     Counseling given: Not Answered Comment: {  Past Medical History:  Diagnosis Date  . Allergic rhinitis   . Bronchitis   . Chronic rhinitis   . Cough   . Dyslipidemia   . Dysphagia   . GERD (gastroesophageal reflux disease)   . Headache(784.0)   . ILD (interstitial lung disease) ([place])   . Mild depression ([place])   . Morbid obesity ([place])   . Type II or diabetes mellitus, uncontrolled 11/2012 dx   Dx 12/08/12: a1c 10.0  . Unspecified essential hypertension   . Urge incontinence    Past Surgical History:  Procedure Laterality Date  . ABDOMINAL HYSTERECTOMY    . BILATERAL VATS ABLATION  02/15/04  . CESAREAN SECTION    . LUNG BIOPSY     . RIGHT HEART CATH N/A 01/30/2017   Procedure: Right Heart Cath;  Surgeon: Larey Dresser, MD;  Location: [facility];  Service: Cardiovascular;  Laterality: N/A;   Family History  Problem Relation Age of Onset  . Sarcoidosis Mother        dxed by transbrochial bx  . Allergies Mother   . Heart disease Father   . Diabetes Father   . Allergies Father   . Coronary artery disease Father   . Cancer Maternal Grandmother        CA of unknown type  . Cancer Paternal Grandmother        CA of unknown type   Social History   Socioeconomic History  . Marital status: Married    Spouse name: Not on file  . Number of children: 1  . Years of education: Not on file  . Highest education level: Not on file  Occupational History  . 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 Financial resource strain: Not hard at all  . Food insecurity:    Worry: Never true    Inability: Never true  . Transportation needs:    Medical: No    Non-medical: No  Tobacco Use  . Smoking status: Never Smoker  . Smokeless tobacco: Never Used  . Tobacco comment: {  Substance and  Sexual Activity  . Alcohol use: No    Alcohol/week: 0.0 standard drinks  . Drug use: No  . Sexual activity: Never  Lifestyle  . Physical activity:    Days per week: 3 days    Minutes per session: 40 min  . Stress: To some extent  Relationships  . Social connections:    Talks on phone: More than three times a week    Gets together: More than three times a week    Attends religious service: More than 4 times per year    Active member of club or organization: Not on file    Attends meetings of clubs or organizations: More than 4 times per year    Relationship status: Married  Other Topics Concern  . Not on file  Social History Narrative  . Not on file    Outpatient Encounter Medications as of 11/21/2018  Medication Sig  . acetaminophen-codeine (TYLENOL  #3) 300-30 MG tablet One every 4 hours as needed for cough  . albuterol (PROVENTIL HFA;VENTOLIN HFA) 108 (90 Base) MCG/ACT inhaler Inhale 2 puffs into the lungs every 4 (four) hours as needed for wheezing or shortness of breath.  Marland Kitchen aspirin 81 MG chewable tablet Chew 81 mg by mouth daily.  Marland Kitchen atorvastatin (LIPITOR) 20 MG tablet TAKE ONE TABLET BY MOUTH DAILY  . B COMPLEX VITAMINS SL Place 1 mL under the tongue 4 (four) times a week.  . budesonide-formoterol (SYMBICORT) 80-4.5 MCG/ACT inhaler Inhale 2 puffs into the lungs 2 (two) times daily.  . calcium carbonate (CALCIUM 600) 600 MG TABS tablet Take 600 mg by mouth daily with breakfast.  . Dextromethorphan-guaiFENesin (MUCINEX DM MAXIMUM STRENGTH) 60-1200 MG TB12 Take 1 tablet by mouth every 12 (twelve) hours as needed (with flutter valve).  . empagliflozin (JARDIANCE) 25 MG TABS tablet Take 1 tablet in AM with or without food -- Office visit needed for further refills  . ferrous sulfate 325 (65 FE) MG EC tablet Take 325 mg by mouth daily.  . fluticasone (FLONASE) 50 MCG/ACT nasal spray Place 2 sprays into both nostrils daily as needed for allergies or rhinitis.  Marland Kitchen glucose blood (ONE TOUCH ULTRA TEST) test strip USE TO CHECK BLOOD SUGARS TWO TIMES A DAY  . hydrochlorothiazide (MICROZIDE) 12.5 MG capsule Take 12.5 mg by mouth daily as needed.  . loratadine (CLARITIN) 10 MG tablet Take 10 mg by mouth daily as needed (nasal drainage, drip).   Marland Kitchen losartan (COZAAR) 25 MG tablet TAKE ONE-HALF TABLET BY MOUTH DAILY  . montelukast (SINGULAIR) 10 MG tablet Take 1 tablet (10 mg total) by mouth at bedtime.  . OXYGEN 2lpm with sleep and 3lpm with exertion  . pantoprazole (PROTONIX) 40 MG tablet TAKE ONE TABLET BY MOUTH TWICE A DAY BEFORE MEALS  . predniSONE (DELTASONE) 5 MG tablet Take 5 mg by mouth daily. May take 2 tabs daily till better then 1 daily as needed  . Respiratory Therapy Supplies (FLUTTER) DEVI Use as directed  . SitaGLIPtin-MetFORMIN HCl (JANUMET  XR) [phone] MG TB24 Take 1 tablet by mouth daily.  . TRULICITY 1.5 KK/9.3GH SOPN INJECT 1.5 MG UNDER THE SKIN ONCE WEEKLY  . Turmeric 500 MG CAPS Take 500 mg by mouth daily.  . vitamin C (ASCORBIC ACID) 500 MG tablet Take 500 mg by mouth daily.  . [DISCONTINUED] DETROL LA 2 MG 24 hr capsule TAKE 1 CAPSULE BY MOUTH DAILY   No facility-administered encounter medications on file as of 11/21/2018.     Activities of  Daily Living No flowsheet data found.  Patient Care Team: Binnie Rail, MD as PCP - General (Internal Medicine) Tanda Rockers, MD (Pulmonary Disease)    Assessment:   This is a routine wellness examination for Regene. Physical assessment deferred to PCP.  Exercise Activities and Dietary recommendations   Diet (meal preparation, eat out, water intake, caffeinated beverages, dairy products, fruits and vegetables): {Desc; diets:16563}  Goals    . Patient Stated     Increase the amount of exercise by trying to get to the gym by 6:00 AM routinely 3 days a week. Staying active at home doing spurts of exercises.       Fall Risk Fall Risk  11/08/2017  Falls in the past year? Yes  Number falls in past yr: 1  Injury with Fall? No  Follow up Falls prevention discussed    Depression Screen PHQ 2/9 Scores 11/08/2017  PHQ - 2 Score 1  PHQ- 9 Score 4     Cognitive Function        Immunization History  Administered Date(s) Administered  . Influenza Split 06/21/2011, 07/29/2012  . Influenza Whole 07/22/2009  . Influenza,inj,Quad PF,6+ Mos 06/15/2013, 08/19/2014, 09/19/2015, 08/31/2016, 08/09/2017, 09/03/2018  . Pneumococcal Conjugate-13 09/19/2015  . Pneumococcal Polysaccharide-23 07/22/2009  . Td 03/24/2009   Screening Tests Health Maintenance  Topic Date Due  . OPHTHALMOLOGY EXAM  10/01/2018  . TETANUS/TDAP  03/25/2019  . HEMOGLOBIN A1C  04/28/2019  . FOOT EXAM  05/13/2019  . INFLUENZA VACCINE  Completed  . PNEUMOCOCCAL POLYSACCHARIDE VACCINE AGE 43-64 HIGH  RISK  Completed  . HIV Screening  Completed      Plan:     I have personally reviewed and noted the following in the patient's chart:   . Medical and social history . Use of alcohol, tobacco or illicit drugs  . Current medications and supplements . Functional ability and status . Nutritional status . Physical activity . Advanced directives . List of other physicians . Vitals . Screenings to include cognitive, depression, and falls . Referrals and appointments  In addition, I have reviewed and discussed with patient certain preventive protocols, quality metrics, and best practice recommendations. A written personalized care plan for preventive services as well as general preventive health recommendations were provided to patient.     Michiel Cowboy, RN  11/20/2018

## 2018-11-20 NOTE — Telephone Encounter (Signed)
Notified pt w/MD response. She agreed to start taking the effexor. Sent rx to [facility]...lmb

## 2018-11-21 ENCOUNTER — Ambulatory Visit: Payer: Medicare Other

## 2018-12-04 ENCOUNTER — Ambulatory Visit (INDEPENDENT_AMBULATORY_CARE_PROVIDER_SITE_OTHER): Payer: Medicare Other | Admitting: Internal Medicine

## 2018-12-04 ENCOUNTER — Encounter: Payer: Self-pay | Admitting: Internal Medicine

## 2018-12-04 ENCOUNTER — Ambulatory Visit: Payer: Medicare Other | Admitting: Internal Medicine

## 2018-12-04 VITALS — BP 132/88 | HR 77 | Ht 60.0 in | Wt 244.0 lb

## 2018-12-04 DIAGNOSIS — J9611 Chronic respiratory failure with hypoxia: Secondary | ICD-10-CM

## 2018-12-04 DIAGNOSIS — J8410 Pulmonary fibrosis, unspecified: Secondary | ICD-10-CM | POA: Diagnosis not present

## 2018-12-04 LAB — PULMONARY FUNCTION TEST
DL/VA % pred: 144 %
DL/VA: 6.5 ml/min/mmHg/L
DLCO unc % pred: 73 %
DLCO unc: 13.72 ml/min/mmHg
FEF 25-75 Post: 2.18 L/sec
FEF 25-75 Pre: 1.31 L/sec
FEF2575-%Change-Post: 66 %
FEF2575-%Pred-Post: 93 %
FEF2575-%Pred-Pre: 56 %
FEV1-%Change-Post: 11 %
FEV1-%Pred-Post: 55 %
FEV1-%Pred-Pre: 50 %
FEV1-Post: 1.15 L
FEV1-Pre: 1.04 L
FEV1FVC-%Change-Post: 0 %
FEV1FVC-%Pred-Pre: 106 %
FEV6-%Change-Post: 10 %
FEV6-%Pred-Post: 53 %
FEV6-%Pred-Pre: 47 %
FEV6-Post: 1.31 L
FEV6-Pre: 1.18 L
FEV6FVC-%Change-Post: 0 %
FEV6FVC-%Pred-Post: 102 %
FEV6FVC-%Pred-Pre: 102 %
FVC-%CHANGE-POST: 10 %
FVC-%Pred-Post: 51 %
FVC-%Pred-Pre: 46 %
FVC-Post: 1.31 L
FVC-Pre: 1.19 L
Post FEV1/FVC ratio: 88 %
Post FEV6/FVC ratio: 100 %
Pre FEV1/FVC ratio: 87 %
Pre FEV6/FVC Ratio: 100 %
RV % pred: 70 %
RV: 1.06 L
TLC % pred: 54 %
TLC: 2.43 L

## 2018-12-04 NOTE — Patient Instructions (Signed)
See calendar for specific medication instructions and bring it back for each and every office visit for every healthcare provider you see.  Without it,  you may not receive the best quality medical care that we feel you deserve. ° °You will note that the calendar groups together  your maintenance  medications that are timed at particular times of the day.  Think of this as your checklist for what your doctor has instructed you to do until your next evaluation to see what benefit  there is  to staying on a consistent group of medications intended to keep you well.  The other group at the bottom is entirely up to you to use as you see fit  for specific symptoms that may arise between visits that require you to treat them on an as needed basis.  Think of this as your action plan or "what if" list.  ° °Separating the top medications from the bottom group is fundamental to providing you adequate care going forward.   ° ° °Please schedule a follow up visit in 6 months but call sooner if needed  ° ° ° ° ° ° ° °

## 2018-12-04 NOTE — Progress Notes (Signed)
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  Since then waxing/waning sx requiring intermittent prednisone tapers with only minimal improvement - most of her symptoms chronically have been related to obesity with dyspnea on exertion and tendency to chronic cough which is felt to be at least partly  reflux related but improved with saba 03/09/2015 so started on dulera 100 2bid in addition to maint pred and seems to have helped but not typically  able to get < 10 mg pred s flare cough/sob     History of Present Illness  November 07, 2010 ov cc cough/ strangling  a bit more with taper off reglan (actually worse before ran out of the low dose reglan)  no excess mucus or increase sob on prednisone @  10 mg daily .  Using med calendar well rec 1)  Ok to resume water aerobics but pace yourself 2)  Wait until your swallowing evaulation is complete before making a decision re reglan > stopped it on her own.  3)  Try Prednisone 10 mg one-half each am   04/04/2012 f/u ov/Junia Nygren no longer using med calendar on Pred 10 mg one daily  added hyzaar to rx for hbp/leg swelling not back to mall walking yet,  No unusual cough, purulent sputum or sinus/hb symptoms on present rx. No variability to symptoms or need for inhaler. Rec Try prednisone 10 mg one half daily to see if affects your ability to walk the mall on 02 or the cough gets worse (walk the mall first for at least before you try) Always walk for exercise on 3lpm - this will help you burn fat and get into negative calorie balance to lose weight.   03/17/2013 f/u ov/Amayra Kiedrowski re NSIP on floor of 10 mg per day Chief Complaint  Patient presents with  . Follow-up    Breathing is overall doing well and she denies any new co's today.   ex on a treadmill x 19m x 3 x daily on 2lpm not the 3lpm as rec >>Prednisone  ceiling is 20 mg per day and floor is 10 mg per day        10/06/2014 f/u ov/Dorcus Riga re: pred 10 mg daily after trying 10/5 until Tgiving and worse cough/ sob  Chief Complaint  Patient presents with  . Follow-up    F/U ILD; breathing doing fine; no complaints  Not doing treadmill  Any more but Not limited by breathing from desired activities   rec Resume treadmill x 30 min daily minimum If  doing great try to reduce prednisone to 10 mg alternating with 5 mg daily as your new floor but increase back to ceiling of 20 mg per day if needed           02/06/2016  f/u ov/Kanna Dafoe re: NSIP / 3lpm with ex/ duler 100 2bid and pred 10 mg daily  Chief Complaint  Patient presents with  . Follow-up    Pt states that she did have some issues with allergies but is improving. Pt also was having issues with Dulera, jittery after use, but has started rinsing her mouth after use and side effects have improved. Pt denies cough/wheeze/SOB/CP/tightness.   limited by L knee from doing treadmill, has trainer but not losing wt  Rhinitis symptoms  better on singulair   rec Work on inhaler technique:    Should be able to tolerate dulera 100 Take 2 puffs first thing in am and then another 2 puffs about 12 hours later.  If so, try prednisone 10 - 5  = even days take a half, odd days take half   06/01/2016  f/u ov/Dimond Crotty re: NSIP  Chief Complaint  Patient presents with  . Follow-up    Breathing is doing well. She denies any new co's today.   when ex 3lpm and back on treadmill x 25 min more limited by knee 2-3x per week at 51mph flat / minimal dry daytime cough on nexium 20 mg bid ac  rec Work on inhaler technique:   Continue  dulera 100 Take 2 puffs first thing in am and then another 2 puffs about 12 hours later.  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  Follow-up    Increased cough x 2 wks  on treadmill x 3lpm x 25 min  Decreased pred Nov 13th and cough worse starting on 23rd  Cough worse at hs and early in am but non-productive  Nose feels dried out/ clogged up/ saline helps  rec Stay on singulair each evening Try dulera 200 Take 2 puffs first thing in am and then another 2 puffs about 12 hours later to see if helps or not by the time you return  Protonix 40 mg Take 30- 60 min before your first and last meals of the day  Prednisone 10 mg daily until better then 10 alternating with 5 mg daily      NP ov 10/05/16 rec Follow med calendar closely and bring to each visit  Return to Prednisone 10mg  daily  Labs today .  Set up for HRCT Chest    07/12/2017 acute extended ov/Floride Hutmacher re: cough and "wheeze'  Chief Complaint  Patient presents with  . Acute Visit    went to UC on 07/08/17 with cough and wheezing. She states dxed with URI. She is still coughing with minimal green sputum and wheezing some.   prednisone from 10 mg to 5 mg over months and on 5mg  all summer ok  On dulera 100 Take 2 puffs first thing in am and then another 2 puffs about 12 hours later but inhaler is on zero,  Not using rescue correctly and did not bring it or med calendar as req   Acutely worse 07/04/17 with cough/ wheeze/ nasal congestion and reqported to UC she did not use saba rec 07/08/17 UC: Start using the albuterol HFA 2 puffs every 4 hours for cough and wheeze. Continue using your other inhalers. Start taking the prednisone 50 mg x 6 days  Take with food. For drainage recommend taking Allegra or Zyrtec daily for the next few days. For nasal congestion Sudafed PE 10 mg every 4-6 hours. rec  For cough > mucinex dm 1200 mg every 12 hours and use use the flutter valve as needed but if still can't stop coughing then add  Tylenol #3 one every 4 hours as needed Continue prednisone 40 x 2 , 30 x 2, 10 x 2 days, and 5 mg daily THEREAFTER ZPAK    08/26/18 NP / med  calendar    11/26/2017  f/u ov/Edwin Baines re:  NSIP/ cough variant asthma/MO / 02 2lpm at bedtime/ 3lpm ex  Doing better with med calendar/action plan Chief Complaint  Patient presents with  . Follow-up    Breathing is doing well. She has only used rescue inhaler x 2 since the last visit.   Dyspnea:  Recumbent bicycle x 20 min / walk 2.6 mph flat on 3lpm low 90's on pred 5 mg  Cough: no need for any cough suppression Sleep: at 30 degrees on 2lpm/ no am ha ra SABA use:  Rarely need any saba  rec Ok when return from [place] to reduce symbicort 80 to 1-2 every 12 hours  If worsen, double the prednsione dose until better then back to 5 mg daily  Work on inhaler technique.        06/30/2018  f/u ov/Leovanni Bjorkman re:  NSIP / ? RA vs fibromyalgia f/u by Ursula Alert  Chief Complaint  Patient presents with  . Follow-up    Breathing is doing well. She has not needed her albuterol inhaler.   Dyspnea:  Very limited activity since shingles/ sats 95%  Cough: only with deep insp/ dry Sleeping: on side/ 2 pillows / flat bed  SABA use: none 02: 2lpm hs and as needed daytime   rec No change rx     12/04/2018  f/u ov/Edu On re: NSIP main on pred 5 mg daily  Chief Complaint  Patient presents with  . Follow-up    PFT done today. Breathing is unchanged. She has not had to use her rescue inhaler.   Dyspnea:  Able to do church steps which is an improvement  Cough reproducible always with deep breath/ dry  Sleeping: bed flat/ propped up to 30 degrees with pillows SABA use: none 02: 2lpm at hs /  3lpm ex and sats 98% per pt    No obvious day to day or daytime variability or assoc excess/ purulent sputum or mucus plugs or hemoptysis or cp or chest tightness, subjective wheeze or overt sinus or hb symptoms.   Sleeping as above  without nocturnal  or early am exacerbation  of respiratory  c/o's or need for noct saba. Also denies any obvious fluctuation of symptoms with weather or environmental changes or other  aggravating or alleviating factors except as outlined above   No unusual exposure hx or h/o childhood pna/ asthma or knowledge of premature birth.  Current Allergies, Complete Past Medical History, Past Surgical History, Family History, and Social History were reviewed in [employer] record.  ROS  The following are not active complaints unless bolded Hoarseness, sore throat, dysphagia, dental problems, itching, sneezing,  nasal congestion or discharge of excess mucus or purulent secretions, ear ache,   fever, chills, sweats, unintended wt loss or wt gain, classically pleuritic or exertional cp,  orthopnea pnd or arm/hand swelling  or leg swelling, presyncope, palpitations, abdominal pain, anorexia, nausea, vomiting, diarrhea  or change in bowel habits or change in bladder habits, change in stools or change in urine, dysuria, hematuria,  rash, arthralgias, visual complaints, headache, numbness, weakness or ataxia or problems with walking or coordination,  change in mood= grief rxn from loss of grandchild or  memory.        Current Meds  Medication Sig  . acetaminophen-codeine (TYLENOL #3) 300-30 MG tablet One every 4 hours as needed for cough  . albuterol (PROVENTIL HFA;VENTOLIN HFA) 108 (90 Base) MCG/ACT inhaler Inhale 2 puffs into the lungs every 4 (four) hours as needed for wheezing or shortness  of breath.  Marland Kitchen aspirin 81 MG chewable tablet Chew 81 mg by mouth daily.  Marland Kitchen atorvastatin (LIPITOR) 20 MG tablet TAKE ONE TABLET BY MOUTH DAILY  . B COMPLEX VITAMINS SL Place 1 mL under the tongue 4 (four) times a week.  . budesonide-formoterol (SYMBICORT) 80-4.5 MCG/ACT inhaler Inhale 2 puffs into the lungs 2 (two) times daily.  . calcium carbonate (CALCIUM 600) 600 MG TABS tablet Take 600 mg by mouth daily with breakfast.  . Dextromethorphan-guaiFENesin (MUCINEX DM MAXIMUM STRENGTH) 60-1200 MG TB12 Take 1 tablet by mouth every 12 (twelve) hours as needed (with flutter valve).  .  empagliflozin (JARDIANCE) 25 MG TABS tablet Take 1 tablet in AM with or without food -- Office visit needed for further refills  . ferrous sulfate 325 (65 FE) MG EC tablet Take 325 mg by mouth daily.  . fluticasone (FLONASE) 50 MCG/ACT nasal spray Place 2 sprays into both nostrils daily as needed for allergies or rhinitis.  Marland Kitchen glucose blood (ONE TOUCH ULTRA TEST) test strip USE TO CHECK BLOOD SUGARS TWO TIMES A DAY  . hydrochlorothiazide (MICROZIDE) 12.5 MG capsule Take 12.5 mg by mouth daily as needed.  . loratadine (CLARITIN) 10 MG tablet Take 10 mg by mouth daily as needed (nasal drainage, drip).   Marland Kitchen losartan (COZAAR) 25 MG tablet TAKE ONE-HALF TABLET BY MOUTH DAILY  . montelukast (SINGULAIR) 10 MG tablet Take 1 tablet (10 mg total) by mouth at bedtime.  . OXYGEN 2lpm with sleep and 3lpm with exertion  . pantoprazole (PROTONIX) 40 MG tablet TAKE ONE TABLET BY MOUTH TWICE A DAY BEFORE MEALS  . predniSONE (DELTASONE) 5 MG tablet Take 5 mg by mouth daily. May take 2 tabs daily till better then 1 daily as needed  . Respiratory Therapy Supplies (FLUTTER) DEVI Use as directed  . SitaGLIPtin-MetFORMIN HCl (JANUMET XR) [phone] MG TB24 Take 1 tablet by mouth daily.  . TRULICITY 1.5 ZO/1.0RU SOPN INJECT 1.5 MG UNDER THE SKIN ONCE WEEKLY  . Turmeric 500 MG CAPS Take 500 mg by mouth daily.  . vitamin C (ASCORBIC ACID) 500 MG tablet Take 500 mg by mouth daily.                   Past Medical History: GASTROESOPHAGEAL REFLUX DISEASE     - Tapered reglan to 10 mg one half twice daily June 22, 2010 > d/c'd 11/2010  -restarted reglan 03/2011 >>unable to tolerate.  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)   -Restart Prednisone 02/24/08   -PFT's December 23, 2008 VC 35%  DLC0 31%   -Desats with > 2 laps 06/22/10 so rec wear 02 2lpm at bedtime and with ex  MORBID OBESITY   - Target wt  =  153  for BMI < 30   Hypertension Health Maintenance..........................................................Marland KitchenMarland KitchenAsa Lente    - Pneumovax July 22, 2009      - Flu 06/21/2011           Objective:   Physical Exam   amb obese  bf/ slt hoarse  Vital signs reviewed - Note on arrival 02 sats  96% on RA      wt  305 May 28, 2008 >   302 November 07, 2010 >   303 03/22/2011 > 10/25/2011  299 > 311 04/04/2012 > 306 03/17/2013 >06/15/2013 303 > 10/06/2013 298  > 02/08/2014 289 > 10/06/2014   288 > 03/09/2015 283 > 10/21/2015    283 >  02/06/2016  284 >  06/01/2016  282 > 08/31/2016   281 > 07/12/2017   270 > 11/26/2017   263> 06/30/2018   252 > 12/04/2018  244   HEENT: nl dentition, turbinates bilaterally, and oropharynx. Nl external ear canals without cough reflex   NECK :  without JVD/Nodes/TM/ nl carotid upstrokes bilaterally   LUNGS: no acc muscle use,  Nl contour chest which is clear to A and P bilaterally with dry coughing fits   At end  insp    CV:  RRR  no s3 or murmur or increase in P2, and no edema   ABD: quite obese  soft and nontender with nl inspiratory excursion in the supine position. No bruits or organomegaly appreciated, bowel sounds nl  MS:  Nl gait/ ext warm without deformities, calf tenderness, cyanosis or clubbing No obvious joint restrictions   SKIN: warm and dry without lesions    NEURO:  alert, approp, nl sensorium with  no motor or cerebellar deficits apparent.                 Assessment & Plan:

## 2018-12-04 NOTE — Progress Notes (Signed)
PFT done today. 

## 2018-12-05 ENCOUNTER — Encounter: Payer: Self-pay | Admitting: Internal Medicine

## 2018-12-05 NOTE — Assessment & Plan Note (Signed)
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)   -Restarted Prednisone 02/24/08   -PFT's December 23, 2008 VC 35%  DLC0 31%   -PFT's 02/08/2014   VC 1.30 (49%) and DLCO is 70%  > rec pred 10/5 > did not tolerate - PFTs  03/09/2015     VC 1.27 (48%) and dlco is 72% @ 10 mg daily  - 02/06/2016 try 10 a/w 5 mg daily > did not try - 06/01/2016 try 10 a/w 5 daily > cough  flared p 1 week on lower dose  -ANA positive 10/2016 >refer to rheumatology  -HRCT chest essentially stable changes since 2005 10/09/2016 >  Rheum eval 12/03/16 [place]line Cables  And f/u q 3 mo as of 06/30/2018   - PFTs 12/04/2018  VC = 1.38 (54%)  And  dlco 13.72 =73% with corrects to 6.5 144 % on pred 5 mg daily = no significant change from prior   The goal with a chronic steroid dependent illness is always arriving at the lowest effective dose that controls the disease/symptoms and not accepting a set "formula" which is based on statistics or guidelines that don't always take into account patient  variability or the natural hx of the dz in every individual patient, which may well vary over time.  For now therefore I recommend the patient maintain  5 mg floor and 10 mg ceiling and keep working on wt los

## 2018-12-05 NOTE — Assessment & Plan Note (Signed)
rx = 2lpm hs and prn with activity up to 3lpm   Adequate control on present rx, reviewed in detail with pt > no change in rx needed

## 2018-12-05 NOTE — Assessment & Plan Note (Signed)
Body mass index is 47.65 kg/m.  -  trending down encouraged Lab Results  Component Value Date   TSH 1.25 05/09/2017     Contributing to gerd risk/ doe/reviewed the need and the process to achieve and maintain neg calorie balance > defer f/u primary care including intermittently monitoring thyroid status     I had an extended discussion with the patient reviewing all relevant studies completed to date and  lasting 15 to 20 minutes of a 25 minute visit    Each maintenance medication was reviewed in detail including most importantly the difference between maintenance and prns and under what circumstances the prns are to be triggered using an action plan format that is not reflected in the computer generated alphabetically organized AVS but trather by a customized med calendar that reflects the AVS meds with confirmed 100% correlation.   In addition, Please see AVS for unique instructions that I personally wrote and verbalized to the the pt in detail and then reviewed with pt  by my nurse highlighting any  changes in therapy recommended at today's visit to their plan of care.

## 2018-12-10 ENCOUNTER — Other Ambulatory Visit: Payer: Self-pay | Admitting: Internal Medicine

## 2018-12-17 ENCOUNTER — Encounter: Payer: Self-pay | Admitting: Emergency Medicine

## 2018-12-17 ENCOUNTER — Other Ambulatory Visit: Payer: Self-pay

## 2018-12-17 ENCOUNTER — Ambulatory Visit (INDEPENDENT_AMBULATORY_CARE_PROVIDER_SITE_OTHER): Payer: Medicare Other

## 2018-12-17 ENCOUNTER — Ambulatory Visit
Admission: EM | Admit: 2018-12-17 | Discharge: 2018-12-17 | Disposition: A | Discharge status: Home / Self Care | Payer: Medicare Other | Attending: Emergency Medicine | Admitting: Emergency Medicine

## 2018-12-17 DIAGNOSIS — M25561 Pain in right knee: Secondary | ICD-10-CM

## 2018-12-17 DIAGNOSIS — M2341 Loose body in knee, right knee: Secondary | ICD-10-CM

## 2018-12-17 MED ORDER — IBUPROFEN 600 MG PO TABS: 600.0000 mg | ORAL_TABLET | Freq: Four times a day (QID) | ORAL | 0 refills | Status: DC | PRN

## 2018-12-17 MED ORDER — ONDANSETRON 8 MG PO TBDP: ORAL_TABLET | ORAL | 0 refills | Status: DC

## 2018-12-17 MED ORDER — ACETAMINOPHEN 325 MG PO TABS
975.0000 mg | ORAL_TABLET | Freq: Once | ORAL | Status: AC
  Administered 2018-12-17: 975 mg via ORAL

## 2018-12-17 MED ORDER — HYDROCODONE-ACETAMINOPHEN 5-325 MG PO TABS: 1.0000 | ORAL_TABLET | Freq: Four times a day (QID) | ORAL | 0 refills | Status: DC | PRN

## 2018-12-17 MED ORDER — KETOROLAC TROMETHAMINE 30 MG/ML IJ SOLN
30.0000 mg | Freq: Once | INTRAMUSCULAR | Status: AC
  Administered 2018-12-17: 30 mg via INTRAMUSCULAR

## 2018-12-17 NOTE — ED Provider Notes (Signed)
HPI  SUBJECTIVE:  [name] is a 48 y.o. female who presents with 3 days of posterior lateral achy, constant knee pain after it "popped" while walking.  She reports clicking, giving way and weakness secondary to pain.  She denies swelling, erythema, distal numbness or tingling, fall, trauma to the knee.  No fevers.  She has never had symptoms like this before.  She tried Voltaren gel, Tylenol arthritis, and Ace wrap, her mom's prescription of Norco and ice and heat.  Symptoms are better with Norco and keeping her knee straight, worse with applying torque, weightbearing.  No antipyretic in the past 4 to 6 hours.  She has a past medical history of osteoarthritis, questionable history of rheumatoid arthritis.  Also for interstitial lung disease, fibromyalgia, diabetes, hypertension, obesity.  LMP: Status post hysterectomy.  PMD: Binnie Rail, MD sports medicine: Dr. Tamala Julian.  States that she has an appointment with him on 3/23.  Orthopedics: None.   Past Medical History:  Diagnosis Date  . Allergic rhinitis   . Bronchitis   . Chronic rhinitis   . Cough   . Dyslipidemia   . Dysphagia   . GERD (gastroesophageal reflux disease)   . Headache(784.0)   . ILD (interstitial lung disease) ([place])   . Mild depression ([place])   . Morbid obesity ([place])   . Type II or diabetes mellitus, uncontrolled 11/2012 dx   Dx 12/08/12: a1c 10.0  . Unspecified essential hypertension   . Urge incontinence     Past Surgical History:  Procedure Laterality Date  . ABDOMINAL HYSTERECTOMY    . BILATERAL VATS ABLATION  02/15/04  . CESAREAN SECTION    . LUNG BIOPSY    . RIGHT HEART CATH N/A 01/30/2017   Procedure: Right Heart Cath;  Surgeon: Larey Dresser, MD;  Location: [facility];  Service: Cardiovascular;  Laterality: N/A;    Family History  Problem Relation Age of Onset  . Sarcoidosis Mother        dxed by transbrochial bx  . Allergies Mother   . Heart disease Father   . Diabetes Father   . Allergies  Father   . Coronary artery disease Father   . Cancer Maternal Grandmother        CA of unknown type  . Cancer Paternal Grandmother        CA of unknown type    Social History   Tobacco Use  . Smoking status: Never Smoker  . Smokeless tobacco: Never Used  . Tobacco comment: {  Substance Use Topics  . Alcohol use: No    Alcohol/week: 0.0 standard drinks  . Drug use: No    No current facility-administered medications for this encounter.   Current Outpatient Medications:  .  albuterol (PROVENTIL HFA;VENTOLIN HFA) 108 (90 Base) MCG/ACT inhaler, Inhale 2 puffs into the lungs every 4 (four) hours as needed for wheezing or shortness of breath., Disp: 1 Inhaler, Rfl: 0 .  aspirin 81 MG chewable tablet, Chew 81 mg by mouth daily., Disp: , Rfl:  .  atorvastatin (LIPITOR) 20 MG tablet, TAKE ONE TABLET BY MOUTH DAILY, Disp: 90 tablet, Rfl: 0 .  B COMPLEX VITAMINS SL, Place 1 mL under the tongue 4 (four) times a week., Disp: , Rfl:  .  budesonide-formoterol (SYMBICORT) 80-4.5 MCG/ACT inhaler, Inhale 2 puffs into the lungs 2 (two) times daily., Disp: 1 Inhaler, Rfl: 0 .  calcium carbonate (CALCIUM 600) 600 MG TABS tablet, Take 600 mg by mouth daily  with breakfast., Disp: , Rfl:  .  Dextromethorphan-guaiFENesin (MUCINEX DM MAXIMUM STRENGTH) 60-1200 MG TB12, Take 1 tablet by mouth every 12 (twelve) hours as needed (with flutter valve)., Disp: , Rfl:  .  empagliflozin (JARDIANCE) 25 MG TABS tablet, Take 1 tablet by mouth every morning with or without food., Disp: 90 tablet, Rfl: 1 .  ferrous sulfate 325 (65 FE) MG EC tablet, Take 325 mg by mouth daily., Disp: , Rfl:  .  fluticasone (FLONASE) 50 MCG/ACT nasal spray, Place 2 sprays into both nostrils daily as needed for allergies or rhinitis., Disp: , Rfl:  .  glucose blood (ONE TOUCH ULTRA TEST) test strip, USE TO CHECK BLOOD SUGARS TWO TIMES A DAY, Disp: 100 each, Rfl: 3 .  hydrochlorothiazide (MICROZIDE) 12.5 MG capsule, Take 12.5 mg by mouth daily  as needed., Disp: , Rfl:  .  HYDROcodone-acetaminophen (NORCO/VICODIN) 5-325 MG tablet, Take 1-2 tablets by mouth every 6 (six) hours as needed for moderate pain or severe pain., Disp: 12 tablet, Rfl: 0 .  ibuprofen (ADVIL,MOTRIN) 600 MG tablet, Take 1 tablet (600 mg total) by mouth every 6 (six) hours as needed., Disp: 30 tablet, Rfl: 0 .  loratadine (CLARITIN) 10 MG tablet, Take 10 mg by mouth daily as needed (nasal drainage, drip). , Disp: , Rfl:  .  losartan (COZAAR) 25 MG tablet, TAKE ONE-HALF TABLET BY MOUTH DAILY, Disp: 45 tablet, Rfl: 3 .  montelukast (SINGULAIR) 10 MG tablet, Take 1 tablet (10 mg total) by mouth at bedtime., Disp: 30 tablet, Rfl: 5 .  ondansetron (ZOFRAN ODT) 8 MG disintegrating tablet, 1/2- 1 tablet q 8 hr prn nausea, vomiting, Disp: 20 tablet, Rfl: 0 .  OXYGEN, 2lpm with sleep and 3lpm with exertion, Disp: , Rfl:  .  pantoprazole (PROTONIX) 40 MG tablet, TAKE ONE TABLET BY MOUTH TWICE A DAY BEFORE MEALS, Disp: 60 tablet, Rfl: 5 .  predniSONE (DELTASONE) 5 MG tablet, Take 5 mg by mouth daily. May take 2 tabs daily till better then 1 daily as needed, Disp: , Rfl:  .  Respiratory Therapy Supplies (FLUTTER) DEVI, Use as directed, Disp: 1 each, Rfl: 0 .  SitaGLIPtin-MetFORMIN HCl (JANUMET XR) [phone] MG TB24, Take 1 tablet by mouth daily., Disp: 90 tablet, Rfl: 1 .  TRULICITY 1.5 YF/7.4BS SOPN, INJECT 1.5 MG UNDER THE SKIN ONCE WEEKLY, Disp: 12 pen, Rfl: 1 .  Turmeric 500 MG CAPS, Take 500 mg by mouth daily., Disp: , Rfl:  .  vitamin C (ASCORBIC ACID) 500 MG tablet, Take 500 mg by mouth daily., Disp: , Rfl:   Allergies  Allergen Reactions  . Penicillins Shortness Of Breath, Swelling and Other (See Comments)    Has patient had a PCN reaction causing immediate rash, facial/tongue/throat swelling, SOB or lightheadedness with hypotension: yes Has patient had a PCN reaction causing severe rash involving mucus membranes or skin necrosis: no Has patient had a PCN reaction that  required hospitalization no Has patient had a PCN reaction occurring within the last 10 years: no If all of the above answers are "NO", then may proceed with Cephalosporin use.   Marland Kitchen Peach Flavor Hives and Other (See Comments)    Fresh peaches only  . Levaquin [Levofloxacin] Nausea Only    Nausea, bloating     ROS  As noted in HPI.   Physical Exam  BP 122/89 (BP Location: Left Arm)   Pulse 95   Temp 98.5 F (36.9 C) (Oral)   Resp 18   SpO2 96%  Constitutional: Well developed, well nourished, appears uncomfortable. Eyes:  EOMI, conjunctiva normal bilaterally HENT: Normocephalic, atraumatic,mucus membranes moist Respiratory: Normal inspiratory effort Cardiovascular: Normal rate GI: nondistended skin: No rash, skin intact Musculoskeletal: R Knee ROM  decreased due to pain, Flexion  Intact, and with full extension, Patella NT, Patellar tendon NT, Medial joint tender, Lateral joint NT, medial Popliteal region tender, Varus LCL stress testing stable, Valgus MCL stress testing stable, McMurray's testing normal, Lachman's negative. Distal NVI with intact baseline sensation / motor / pulse distal to knee.  No effusion. No erythema. No increased temperature. No crepitus.  Neurologic: Alert & oriented x 3, no focal neuro deficits Psychiatric: Speech and behavior appropriate   ED Course   Medications  acetaminophen (TYLENOL) tablet 975 mg (975 mg Oral Given 12/17/18 1525)  ketorolac (TORADOL) 30 MG/ML injection 30 mg (30 mg Intramuscular Given 12/17/18 1525)    Orders Placed This Encounter  Procedures  . DG Knee Complete 4 Views Right    Standing Status:   Standing    Number of Occurrences:   1    Order Specific Question:   Reason for Exam (SYMPTOM  OR DIAGNOSIS REQUIRED)    Answer:   knee pain    Order Specific Question:   Is patient pregnant?    Answer:   No  . Crutches    Standing Status:   Standing    Number of Occurrences:   1  . Apply knee sleeve    Standing Status:    Standing    Number of Occurrences:   1    Order Specific Question:   Laterality    Answer:   Right    No results found for this or any previous visit (from the past 24 hour(s)). Dg Knee Complete 4 Views Right  Result Date: 12/17/2018 CLINICAL DATA:  Right knee pain after feeling a popping sensation followed by swelling 3 days ago. EXAM: RIGHT KNEE - COMPLETE 4+ VIEW COMPARISON:  None. FINDINGS: Mild posterior patellar spur formation and mild medial spur formation. Anterior loose body. No fracture, dislocation or effusion seen. IMPRESSION: 1. Mild degenerative changes. 2. Anterior loose body. Electronically Signed   By: Claudie Revering M.D.   On: 12/17/2018 14:21    ED Clinical Impression  Acute pain of right knee  Loose body of right knee   ED Assessment/Plan  [place] Narcotic database reviewed for this patient, and feel that the risk/benefit ratio today is favorable for proceeding with a prescription for controlled substance.  No Opiates since September 2019.  Reviewed imaging independently.  Anterior loose body.  No fracture, dislocation, effusion.  See radiology report for full details.  Suspect that the loose body is causing patient's pain.  She was in a moderate amount of painful distress, gave her 30 mg of Toradol with 1 g of Tylenol.  Will place a knee sleeve, crutches.  Will refer to Dr. Doreatha Martin, orthopedics on call.  Sending home with ibuprofen 600 mg combined with 1 g of Tylenol or ibuprofen/Norco.  Continue ice.  Zofran because she states that all narcotics make her vomit.  Discontinue Voltaren gel.   Discussed  imaging, MDM, treatment plan, and plan for follow-up with patient.  patient agrees with plan.   Meds ordered this encounter  Medications  . acetaminophen (TYLENOL) tablet 975 mg  . ketorolac (TORADOL) 30 MG/ML injection 30 mg  . HYDROcodone-acetaminophen (NORCO/VICODIN) 5-325 MG tablet    Sig: Take 1-2 tablets by mouth every 6 (six) hours as needed  for moderate pain or  severe pain.    Dispense:  12 tablet    Refill:  0  . ibuprofen (ADVIL,MOTRIN) 600 MG tablet    Sig: Take 1 tablet (600 mg total) by mouth every 6 (six) hours as needed.    Dispense:  30 tablet    Refill:  0  . ondansetron (ZOFRAN ODT) 8 MG disintegrating tablet    Sig: 1/2- 1 tablet q 8 hr prn nausea, vomiting    Dispense:  20 tablet    Refill:  0    *This clinic note was created using [employer]. Therefore, there may be occasional mistakes despite careful proofreading.   ?    Melynda Ripple, MD 12/17/18 1721

## 2018-12-17 NOTE — ED Triage Notes (Signed)
Pt presents to [facility] for assessment of right knee pain since she was walking in wedge heels at [employer], felt a "pop" and it began swelling and being more painful throughout service.  States she was barely able to walk home after church.  Pt states she has been in bed and resting in for 2 days, using hydrocodone from her mom, gels, wrapping it, and it is not improving.

## 2018-12-17 NOTE — ED Notes (Signed)
Patient able to ambulate independently  

## 2018-12-17 NOTE — Discharge Instructions (Addendum)
Continue ice 20 minutes at a time.  Take 600 mg of ibuprofen combined with a Tylenol containing product 3 or 4 times a day.  Either ibuprofen/1 g of Tylenol for mild to moderate pain, or ibuprofen with 1-2 Norco for severe pain.  Stop the Voltaren gel.

## 2018-12-18 ENCOUNTER — Other Ambulatory Visit: Payer: Self-pay | Admitting: Internal Medicine

## 2019-01-29 ENCOUNTER — Other Ambulatory Visit: Payer: Self-pay

## 2019-01-29 MED ORDER — PANTOPRAZOLE SODIUM 40 MG PO TBEC: DELAYED_RELEASE_TABLET | ORAL | 1 refills | Status: DC

## 2019-01-29 NOTE — Telephone Encounter (Signed)
Received a refill request for pantoprazole. Does not look like you have filled this one for her. Please advise if it is ok to refill by you.

## 2019-02-16 ENCOUNTER — Other Ambulatory Visit: Payer: Self-pay | Admitting: Internal Medicine

## 2019-03-05 NOTE — Progress Notes (Signed)
Subjective:    Patient ID: [name], female    DOB: October 22, 1970, 48 y.o.   MRN: [id number]  HPI She is here for a surgical clearance.   She is here for pre-operative clearance at the request of Dr Griffin Basil for right knee arthroscopy for meniscus tear and arthritis not yet scheduled.  She will not be having general anesthesia.    She had an injection and it helped only a little bit.   She is taking meloxicam and celebrex.  She is having difficulty walking and can not step up first with her right knee.    She denies any personal or family history of problems with anesthesia or bleeding/blood clot problems.    She has no concerns. She is taking all his medication as prescribed.   She is doing some chair exercises.  She can not do much walking or going out much.  With her daily activities she denies chest pain, palpitations, SOB and lightheadedness.    Chronic resp failure with hypoxia - she uses 2 L/min via [place] hs and 3 L/min with exercise or prn.   Her sugars at home have been 90-100.  This morning it was 102.     Medications and allergies reviewed with patient and updated if appropriate.  Patient Active Problem List   Diagnosis Date Noted  . Anxiety 02/05/2018  . Peroneal tendinitis of lower leg, right 10/22/2017  . Chronic respiratory failure with hypoxia ([place]) 07/20/2017  . Knee pain, left 02/02/2016  . Cough variant asthma 03/09/2015  . Diabetes type 2, uncontrolled ([place]) 12/09/2012  . Dyslipidemia   . Acute on chronic respiratory failure with hypoxia ([place]) 04/05/2012  . CONJUNCTIVITIS, ALLERGIC, SEASONAL 01/05/2010  . Allergic rhinitis 01/05/2010  . Essential hypertension 09/07/2009  . URINARY INCONTINENCE, URGE 03/24/2009  . CHRONIC RHINITIS 12/23/2008  . Morbid obesity due to excess calories ([place]) 10/10/2007  . INTERSTITIAL LUNG DISEASE 10/10/2007  . GASTROESOPHAGEAL REFLUX DISEASE 10/10/2007    Current Outpatient Medications on File Prior to Visit  Medication  Sig Dispense Refill  . albuterol (PROVENTIL HFA;VENTOLIN HFA) 108 (90 Base) MCG/ACT inhaler Inhale 2 puffs into the lungs every 4 (four) hours as needed for wheezing or shortness of breath. 1 Inhaler 0  . aspirin 81 MG chewable tablet Chew 81 mg by mouth daily.    Marland Kitchen atorvastatin (LIPITOR) 20 MG tablet TAKE ONE TABLET BY MOUTH DAILY 90 tablet 1  . B COMPLEX VITAMINS SL Place 1 mL under the tongue 4 (four) times a week.    . budesonide-formoterol (SYMBICORT) 80-4.5 MCG/ACT inhaler Inhale 2 puffs into the lungs 2 (two) times daily. 1 Inhaler 0  . calcium carbonate (CALCIUM 600) 600 MG TABS tablet Take 600 mg by mouth daily with breakfast.    . celecoxib (CELEBREX) 100 MG capsule Take 100 mg by mouth 2 (two) times daily.    Marland Kitchen Dextromethorphan-guaiFENesin (MUCINEX DM MAXIMUM STRENGTH) 60-1200 MG TB12 Take 1 tablet by mouth every 12 (twelve) hours as needed (with flutter valve).    . empagliflozin (JARDIANCE) 25 MG TABS tablet Take 1 tablet by mouth every morning with or without food. 90 tablet 1  . ferrous sulfate 325 (65 FE) MG EC tablet Take 325 mg by mouth daily.    . fluticasone (FLONASE) 50 MCG/ACT nasal spray Place 2 sprays into both nostrils daily as needed for allergies or rhinitis.    Marland Kitchen glucose blood (ONE TOUCH ULTRA TEST) test strip USE TO CHECK BLOOD SUGARS TWO TIMES  A DAY 100 each 3  . hydrochlorothiazide (MICROZIDE) 12.5 MG capsule Take 12.5 mg by mouth daily as needed.    Marland Kitchen ibuprofen (ADVIL,MOTRIN) 600 MG tablet Take 1 tablet (600 mg total) by mouth every 6 (six) hours as needed. 30 tablet 0  . loratadine (CLARITIN) 10 MG tablet Take 10 mg by mouth daily as needed (nasal drainage, drip).     Marland Kitchen losartan (COZAAR) 25 MG tablet TAKE ONE-HALF TABLET BY MOUTH DAILY 45 tablet 3  . meloxicam (MOBIC) 15 MG tablet Take 15 mg by mouth daily.    . montelukast (SINGULAIR) 10 MG tablet Take 1 tablet (10 mg total) by mouth at bedtime. 30 tablet 5  . ondansetron (ZOFRAN ODT) 8 MG disintegrating tablet  1/2- 1 tablet q 8 hr prn nausea, vomiting 20 tablet 0  . OXYGEN 2lpm with sleep and 3lpm with exertion    . pantoprazole (PROTONIX) 40 MG tablet TAKE ONE TABLET BY MOUTH TWICE A DAY BEFORE MEALS 60 tablet 1  . predniSONE (DELTASONE) 5 MG tablet Take 5 mg by mouth daily. May take 2 tabs daily till better then 1 daily as needed    . Respiratory Therapy Supplies (FLUTTER) DEVI Use as directed 1 each 0  . SitaGLIPtin-MetFORMIN HCl (JANUMET XR) [phone] MG TB24 Take 1 tablet by mouth daily. 90 tablet 1  . TRULICITY 1.5 ZH/0.8MV SOPN INJECT 1.5 MG UNDER THE SKIN ONCE WEEKLY 12 pen 1  . Turmeric 500 MG CAPS Take 500 mg by mouth daily.    . vitamin C (ASCORBIC ACID) 500 MG tablet Take 500 mg by mouth daily.    . [DISCONTINUED] DETROL LA 2 MG 24 hr capsule TAKE 1 CAPSULE BY MOUTH DAILY 30 capsule 10   No current facility-administered medications on file prior to visit.     Past Medical History:  Diagnosis Date  . Allergic rhinitis   . Bronchitis   . Chronic rhinitis   . Cough   . Dyslipidemia   . Dysphagia   . GERD (gastroesophageal reflux disease)   . Headache(784.0)   . ILD (interstitial lung disease) ([place])   . Mild depression ([place])   . Morbid obesity ([place])   . Type II or diabetes mellitus, uncontrolled 11/2012 dx   Dx 12/08/12: a1c 10.0  . Unspecified essential hypertension   . Urge incontinence     Past Surgical History:  Procedure Laterality Date  . ABDOMINAL HYSTERECTOMY    . BILATERAL VATS ABLATION  02/15/04  . CESAREAN SECTION    . LUNG BIOPSY    . RIGHT HEART CATH N/A 01/30/2017   Procedure: Right Heart Cath;  Surgeon: Larey Dresser, MD;  Location: [facility];  Service: Cardiovascular;  Laterality: N/A;    Social History   Socioeconomic History  . Marital status: Married    Spouse name: Not on file  . Number of children: 1  . Years of education: Not on file  . Highest education level: Not on file  Occupational History  . 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 Financial resource strain: Not hard at all  . Food insecurity:    Worry: Never true    Inability: Never true  . Transportation needs:    Medical: No    Non-medical: No  Tobacco Use  . Smoking status: Never Smoker  . Smokeless tobacco: Never Used  . Tobacco comment: {  Substance  and Sexual Activity  . Alcohol use: No    Alcohol/week: 0.0 standard drinks  . Drug use: No  . Sexual activity: Never  Lifestyle  . Physical activity:    Days per week: 3 days    Minutes per session: 40 min  . Stress: To some extent  Relationships  . Social connections:    Talks on phone: More than three times a week    Gets together: More than three times a week    Attends religious service: More than 4 times per year    Active member of club or organization: Not on file    Attends meetings of clubs or organizations: More than 4 times per year    Relationship status: Married  Other Topics Concern  . Not on file  Social History Narrative  . Not on file    Family History  Problem Relation Age of Onset  . Sarcoidosis Mother        dxed by transbrochial bx  . Allergies Mother   . Heart disease Father   . Diabetes Father   . Allergies Father   . Coronary artery disease Father   . Cancer Maternal Grandmother        CA of unknown type  . Cancer Paternal Grandmother        CA of unknown type    Review of Systems  Constitutional: Negative for chills and fever.  Respiratory: Negative for cough, shortness of breath and wheezing.   Cardiovascular: Negative for chest pain and palpitations.  Gastrointestinal: Negative for abdominal pain, blood in stool, constipation, diarrhea and nausea.  Genitourinary: Negative for dysuria and hematuria.  Neurological: Negative for dizziness, light-headedness and headaches.       Objective:   Vitals:   03/06/19 0934  BP: 118/82  Pulse: 92  Resp: 18  Temp: 98.7 F (37.1 C)  SpO2:  99%   Filed Weights   03/06/19 0934  Weight: 244 lb (110.7 kg)   Body mass index is 47.65 kg/m.  BP Readings from Last 3 Encounters:  03/06/19 118/82  12/17/18 122/89  12/04/18 132/88    Wt Readings from Last 3 Encounters:  03/06/19 244 lb (110.7 kg)  12/04/18 244 lb (110.7 kg)  10/28/18 241 lb (109.3 kg)     Physical Exam Constitutional: She appears well-developed and well-nourished. No distress.  HENT:  Head: Normocephalic and atraumatic.  Right Ear: External ear normal.  Left Ear: External ear normal.   Eyes: Conjunctivae and EOM are normal.  Neck: Neck supple. No tracheal deviation present. No thyromegaly present. No carotid bruit  Cardiovascular: Normal rate, regular rhythm and normal heart sounds.   No murmur heard.  No edema. Pulmonary/Chest: Effort normal and breath sounds normal. No respiratory distress. She has no wheezes. She has no rales.  Abdominal: Soft. She exhibits no distension. There is no tenderness.  Lymphadenopathy: She has no cervical adenopathy.  Skin: Skin is warm and dry. She is not diaphoretic.  Psychiatric: She has a normal mood and affect. Her behavior is normal.        Assessment & Plan:     See Problem List for Assessment and Plan of chronic medical problems.

## 2019-03-06 ENCOUNTER — Other Ambulatory Visit: Payer: Self-pay

## 2019-03-06 ENCOUNTER — Other Ambulatory Visit (INDEPENDENT_AMBULATORY_CARE_PROVIDER_SITE_OTHER): Payer: Medicare Other

## 2019-03-06 ENCOUNTER — Encounter: Payer: Self-pay | Admitting: Internal Medicine

## 2019-03-06 ENCOUNTER — Ambulatory Visit (INDEPENDENT_AMBULATORY_CARE_PROVIDER_SITE_OTHER): Payer: Medicare Other | Admitting: Internal Medicine

## 2019-03-06 VITALS — BP 118/82 | HR 92 | Temp 98.7°F | Resp 18 | Ht 60.0 in | Wt 244.0 lb

## 2019-03-06 DIAGNOSIS — E1165 Type 2 diabetes mellitus with hyperglycemia: Secondary | ICD-10-CM

## 2019-03-06 DIAGNOSIS — J9611 Chronic respiratory failure with hypoxia: Secondary | ICD-10-CM | POA: Diagnosis not present

## 2019-03-06 DIAGNOSIS — I10 Essential (primary) hypertension: Secondary | ICD-10-CM | POA: Diagnosis not present

## 2019-03-06 DIAGNOSIS — Z01818 Encounter for other preprocedural examination: Secondary | ICD-10-CM

## 2019-03-06 LAB — COMPREHENSIVE METABOLIC PANEL
ALT: 17 U/L (ref 0–35)
AST: 15 U/L (ref 0–37)
Albumin: 4 g/dL (ref 3.5–5.2)
Alkaline Phosphatase: 71 U/L (ref 39–117)
BUN: 15 mg/dL (ref 6–23)
CO2: 27 mEq/L (ref 19–32)
Calcium: 9.6 mg/dL (ref 8.4–10.5)
Chloride: 99 mEq/L (ref 96–112)
Creatinine, Ser: 0.62 mg/dL (ref 0.40–1.20)
GFR: 124.88 mL/min (ref >60.00)
Glucose, Bld: 123 mg/dL — ABNORMAL HIGH (ref 70–99)
Potassium: 3.9 mEq/L (ref 3.5–5.1)
Sodium: 136 mEq/L (ref 135–145)
Total Bilirubin: 0.3 mg/dL (ref 0.2–1.2)
Total Protein: 7.3 g/dL (ref 6.0–8.3)

## 2019-03-06 LAB — CBC WITH DIFFERENTIAL/PLATELET
Basophils Absolute: 0 10*3/uL (ref 0.0–0.1)
Basophils Relative: 0.6 % (ref 0.0–3.0)
Eosinophils Absolute: 0 10*3/uL (ref 0.0–0.7)
Eosinophils Relative: 0.6 % (ref 0.0–5.0)
HCT: 43.3 % (ref 36.0–46.0)
Hemoglobin: 14.5 g/dL (ref 12.0–15.0)
Lymphocytes Relative: 28.3 % (ref 12.0–46.0)
Lymphs Abs: 2 10*3/uL (ref 0.7–4.0)
MCHC: 33.4 g/dL (ref 30.0–36.0)
MCV: 91.3 fl (ref 78.0–100.0)
Monocytes Absolute: 0.9 10*3/uL (ref 0.1–1.0)
Monocytes Relative: 12.1 % — ABNORMAL HIGH (ref 3.0–12.0)
Neutro Abs: 4.2 10*3/uL (ref 1.4–7.7)
Neutrophils Relative %: 58.4 % (ref 43.0–77.0)
Platelets: 244 10*3/uL (ref 150.0–400.0)
RBC: 4.75 Mil/uL (ref 3.87–5.11)
RDW: 15.2 % (ref 11.5–15.5)
WBC: 7.2 10*3/uL (ref 4.0–10.5)

## 2019-03-06 LAB — HEMOGLOBIN A1C: Hgb A1c MFr Bld: 7.4 % — ABNORMAL HIGH (ref 4.6–6.5)

## 2019-03-06 NOTE — Assessment & Plan Note (Signed)
Sugars have been well controlled at home Will check A1c Continue current medications

## 2019-03-06 NOTE — Assessment & Plan Note (Signed)
Following with pulmonary Controlled, stable 2 L/min at night and as needed with activity up to 3 L/min

## 2019-03-06 NOTE — Assessment & Plan Note (Addendum)
Chronic medical problems well controlled and stable She has no known coronary artery disease and no symptoms consistent with coronary artery disease with her daily activities Pulmonary disease stable She will not be having general anesthesia, which is a good idea given her pulmonary condition EKG today: Normal sinus rhythm at 90 bpm, short PR, otherwise normal EKG without acute changes.  No significant change from previous EKGs from 01/2017 or 11/2016 We will check her basic blood work for her chronic medical problems  No further evaluation or testing needed Stable for surgery-medically and cardiac cleared We will send form to surgeon

## 2019-03-06 NOTE — Assessment & Plan Note (Signed)
BP Readings from Last 3 Encounters:  03/06/19 118/82  12/17/18 122/89  12/04/18 132/88    BP well controlled Current regimen effective and well tolerated Continue current medications at current doses cmp

## 2019-03-06 NOTE — Patient Instructions (Addendum)
  Tests ordered today. Your results will be released to [facility] (or called to you) after review, usually within 72hours after test completion. If any changes need to be made, you will be notified at that same time.   Medications reviewed and updated.  Changes include :   none  An EKG was done today.   Please followup in 6 months

## 2019-03-07 ENCOUNTER — Encounter: Payer: Self-pay | Admitting: Internal Medicine

## 2019-03-11 ENCOUNTER — Other Ambulatory Visit: Payer: Self-pay | Admitting: Internal Medicine

## 2019-03-12 ENCOUNTER — Encounter: Payer: Self-pay | Admitting: Internal Medicine

## 2019-03-13 ENCOUNTER — Other Ambulatory Visit: Payer: Self-pay

## 2019-03-13 ENCOUNTER — Telehealth: Payer: Self-pay | Admitting: Internal Medicine

## 2019-03-13 MED ORDER — VENLAFAXINE HCL ER 37.5 MG PO CP24: 37.5000 mg | ORAL_CAPSULE | Freq: Every day | ORAL | 1 refills | Status: DC

## 2019-03-13 NOTE — Telephone Encounter (Signed)
Spoke with pt, she states she is having knee surgery with local anesthesia. They cannot schedule the surgery until she has clearance from [facility]. Raliegh Ip sent over a surgical clearance.form for Dr. Melvyn Novas to fill out.  SHe also stated that she needed a handicap placard and that she sent a form over to be filled out.  Magda Paganini have you seen any of these forms?

## 2019-03-16 NOTE — Telephone Encounter (Signed)
Both forms done  Spoke with the pt and notified of this I have faxed the surgery clearance form and will mail the hadnicap form to her home address which I verified with her per her request  Nothing further needed

## 2019-03-24 NOTE — Patient Instructions (Addendum)
[name]   Your procedure is scheduled on: Wednesday 04/01/2019   Report to [facility] Main  Entrance Report to admitting at  11:50 AM               YOU NEED TO HAVE A COVID 19 TEST ON_Saturday 6/29______ @_______ ,   THIS TEST MUST BE DONE BEFORE SURGERY, COME TO [place].  ONCE YOUR COVID TEST IS COMPLETED, PLEASE BEGIN THE QUARANTINE INSTRUCTIONS AS OUTLINED IN YOUR HANDOUT.    Call this number if you have problems the morning of surgery [phone]   How to Manage Your Diabetes Before and After Surgery  Why is it important to control my blood sugar before and after surgery? . Improving blood sugar levels before and after surgery helps healing and can limit problems. . A way of improving blood sugar control is eating a healthy diet by: o  Eating less sugar and carbohydrates o  Increasing activity/exercise o  Talking with your doctor about reaching your blood sugar goals . High blood sugars (greater than 180 mg/dL) can raise your risk of infections and slow your recovery, so you will need to focus on controlling your diabetes during the weeks before surgery. . Make sure that the doctor who takes care of your diabetes knows about your planned surgery including the date and location.  How do I manage my blood sugar before surgery? . Check your blood sugar at least 4 times a day, starting 2 days before surgery, to make sure that the level is not too high or low. o Check your blood sugar the morning of your surgery when you wake up and every 2 hours until you get to the Short Stay unit. . If your blood sugar is less than 70 mg/dL, you will need to treat for low blood sugar: o Do not take insulin. o Treat a low blood sugar (less than 70 mg/dL) with  cup of clear juice (cranberry or apple), 4 glucose tablets, OR glucose gel. o Recheck blood sugar in 15 minutes after treatment (to make sure it is greater than 70 mg/dL).  If your blood sugar is not greater than 70 mg/dL on recheck, call [phone] for further instructions. . Report your blood sugar to the short stay nurse when you get to Short Stay.  . If you are admitted to the hospital after surgery: o Your blood sugar will be checked by the staff and you will probably be given insulin after surgery (instead of oral diabetes medicines) to make sure you have good blood sugar levels. o The goal for blood sugar control after surgery is 80-180 mg/dL.   WHAT DO I DO ABOUT MY DIABETES MEDICATION?         The day before surgery, DO NOT TAKE EMPAGLIFLOZIN (JARDIANCE)!         The day before surgery, Take Stagliptin-Metformin HCL (Janumet XR) as usual.          The day before surgery, if you take Trulicity on a Tuesday, you may take it as usual!  . Do not take oral diabetes medicines (pills) the morning of surgery.       . The day of surgery, do not take other diabetes injectables, including Byetta (exenatide), Bydureon (exenatide ER), Victoza (liraglutide), or Trulicity (dulaglutide).      Remember: Do not eat food:After Midnight . May have clear liquids from  midnight up until 07:50 am then nothing until after . Finish G2 drink at 7:50 then nothing more by mouth.  CLEAR LIQUID DIET   Foods Allowed                                                                     Foods Excluded  Coffee and tea, regular and decaf                             liquids that you cannot  Plain Jell-O in any flavor                                             see through such as: Fruit ices (not with fruit pulp)                                     milk, soups, orange juice  Iced Popsicles                                    All solid food Carbonated beverages, regular and diet                                    Cranberry, grape and apple juices Sports drinks like Gatorade Lightly seasoned clear broth or consume(fat free) Sugar, honey syrup  Sample Menu Breakfast                                 Lunch                                     Supper Cranberry juice                    Beef broth                            Chicken broth Jell-O                                     Grape juice                           Apple juice Coffee or tea                        Jell-O                                      Popsicle  Coffee or tea                        Coffee or tea  _____________________________________________________________________                 BRUSH YOUR TEETH MORNING OF SURGERY AND RINSE YOUR MOUTH OUT, NO CHEWING GUM CANDY OR MINTS.     Take these medicines the morning of surgery with A SIP OF WATER: Pantoprazole (Protonix), Venlafaxine XR (Effexor XR), use Symbicort inhaler and use Albuterol inhaler if needed and bring inhalers with             you to the hospital.               DO NOT TAKE ANY DIABETIC MEDICATIONS DAY OF YOUR SURGERY!                               You may not have any metal on your body including hair pins and              piercings  Do not wear jewelry, make-up, lotions, powders or perfumes, deodorant             Do not wear nail polish.  Do not shave  48 hours prior to surgery.             Do not bring valuables to the hospital. [place].  Contacts, dentures or bridgework may not be worn into surgery.  Leave suitcase in the car. After surgery it may be brought to your room.     Patients discharged the day of surgery will not be allowed to drive home. IF YOU ARE HAVING SURGERY AND GOING HOME THE SAME DAY, YOU MUST HAVE AN ADULT TO DRIVE YOU HOME AND BE WITH YOU FOR 24 HOURS. YOU MAY GO HOME BY TAXI OR UBER OR ORTHERWISE, BUT AN ADULT MUST ACCOMPANY YOU HOME AND STAY WITH YOU FOR 24 HOURS.  Name and phone number of your driver:                Please read over the following fact sheets you were  given: _____________________________________________________________________             [facility] - Preparing for Surgery Before surgery, you can play an important role.  Because skin is not sterile, your skin needs to be as free of germs as possible.  You can reduce the number of germs on your skin by washing with CHG (chlorahexidine gluconate) soap before surgery.  CHG is an antiseptic cleaner which kills germs and bonds with the skin to continue killing germs even after washing. Please DO NOT use if you have an allergy to CHG or antibacterial soaps.  If your skin becomes reddened/irritated stop using the CHG and inform your nurse when you arrive at Short Stay. Do not shave (including legs and underarms) for at least 48 hours prior to the first CHG shower.  You may shave your face/neck. Please follow these instructions carefully:  1.  Shower with CHG Soap the night before surgery and the  morning of Surgery.  2.  If you choose to wash your hair, wash your hair first as usual with your  normal  shampoo.  3.  After you shampoo, rinse your hair and body  thoroughly to remove the  shampoo.                           4.  Use CHG as you would any other liquid soap.  You can apply chg directly  to the skin and wash                       Gently with a scrungie or clean washcloth.  5.  Apply the CHG Soap to your body ONLY FROM THE NECK DOWN.   Do not use on face/ open                           Wound or open sores. Avoid contact with eyes, ears mouth and genitals (private parts).                       Wash face,  Genitals (private parts) with your normal soap.             6.  Wash thoroughly, paying special attention to the area where your surgery  will be performed.  7.  Thoroughly rinse your body with warm water from the neck down.  8.  DO NOT shower/wash with your normal soap after using and rinsing off  the CHG Soap.                9.  Pat yourself dry with a clean towel.            10.  Wear  clean pajamas.            11.  Place clean sheets on your bed the night of your first shower and do not  sleep with pets. Day of Surgery : Do not apply any lotions/deodorants the morning of surgery.  Please wear clean clothes to the hospital/surgery center.  FAILURE TO FOLLOW THESE INSTRUCTIONS MAY RESULT IN THE CANCELLATION OF YOUR SURGERY PATIENT SIGNATURE_________________________________  NURSE SIGNATURE__________________________________  ________________________________________________________________________

## 2019-03-24 NOTE — Progress Notes (Signed)
Please place orders in Epic as patient has a Pre-op appointment on 03/25/2019! Thank you!

## 2019-03-24 NOTE — Progress Notes (Addendum)
03/06/2019- noted in Epic- Medical Clearance from Dr. Billey Gosling , EKG and labs- CBC w/diff., CMP, HgA1c (7.4)  01/30/2017- noted in Epic- Right heart cath  01/24/2017- noted in Epic- ECHO

## 2019-03-25 ENCOUNTER — Encounter (HOSPITAL_COMMUNITY): Payer: Self-pay

## 2019-03-25 ENCOUNTER — Other Ambulatory Visit: Payer: Self-pay

## 2019-03-25 ENCOUNTER — Encounter (HOSPITAL_COMMUNITY)
Admission: RE | Admit: 2019-03-25 | Discharge: 2019-03-25 | Disposition: A | Discharge status: Home / Self Care | Payer: Medicare Other | Source: Ambulatory Visit | Attending: Orthopaedic Surgery | Admitting: Orthopaedic Surgery

## 2019-03-25 DIAGNOSIS — E785 Hyperlipidemia, unspecified: Secondary | ICD-10-CM | POA: Diagnosis not present

## 2019-03-25 DIAGNOSIS — I10 Essential (primary) hypertension: Secondary | ICD-10-CM | POA: Diagnosis not present

## 2019-03-25 DIAGNOSIS — K219 Gastro-esophageal reflux disease without esophagitis: Secondary | ICD-10-CM | POA: Diagnosis not present

## 2019-03-25 DIAGNOSIS — Z79899 Other long term (current) drug therapy: Secondary | ICD-10-CM | POA: Diagnosis not present

## 2019-03-25 DIAGNOSIS — Z7982 Long term (current) use of aspirin: Secondary | ICD-10-CM | POA: Diagnosis not present

## 2019-03-25 DIAGNOSIS — J849 Interstitial pulmonary disease, unspecified: Secondary | ICD-10-CM | POA: Diagnosis not present

## 2019-03-25 DIAGNOSIS — Z01812 Encounter for preprocedural laboratory examination: Secondary | ICD-10-CM | POA: Diagnosis present

## 2019-03-25 DIAGNOSIS — S83241A Other tear of medial meniscus, current injury, right knee, initial encounter: Secondary | ICD-10-CM | POA: Insufficient documentation

## 2019-03-25 DIAGNOSIS — Z1159 Encounter for screening for other viral diseases: Secondary | ICD-10-CM | POA: Insufficient documentation

## 2019-03-25 DIAGNOSIS — Z7984 Long term (current) use of oral hypoglycemic drugs: Secondary | ICD-10-CM | POA: Diagnosis not present

## 2019-03-25 DIAGNOSIS — X58XXXA Exposure to other specified factors, initial encounter: Secondary | ICD-10-CM | POA: Diagnosis not present

## 2019-03-25 DIAGNOSIS — E119 Type 2 diabetes mellitus without complications: Secondary | ICD-10-CM | POA: Diagnosis not present

## 2019-03-25 DIAGNOSIS — Z9981 Dependence on supplemental oxygen: Secondary | ICD-10-CM | POA: Insufficient documentation

## 2019-03-25 HISTORY — DX: Unspecified osteoarthritis, unspecified site: M19.90

## 2019-03-25 LAB — BASIC METABOLIC PANEL
Anion gap: 9 (ref 5–15)
BUN: 18 mg/dL (ref 6–20)
CO2: 27 mmol/L (ref 22–32)
Calcium: 9.2 mg/dL (ref 8.9–10.3)
Chloride: 104 mmol/L (ref 98–111)
Creatinine, Ser: 0.63 mg/dL (ref 0.44–1.00)
GFR calc Af Amer: >60 mL/min (ref >60)
GFR calc non Af Amer: >60 mL/min (ref >60)
Glucose, Bld: 171 mg/dL — ABNORMAL HIGH (ref 70–99)
Potassium: 3.6 mmol/L (ref 3.5–5.1)
Sodium: 140 mmol/L (ref 135–145)

## 2019-03-25 LAB — CBC
HCT: 44.6 % (ref 36.0–46.0)
Hemoglobin: 14.1 g/dL (ref 12.0–15.0)
MCH: 29.9 pg (ref 26.0–34.0)
MCHC: 31.6 g/dL (ref 30.0–36.0)
MCV: 94.5 fL (ref 80.0–100.0)
Platelets: 248 10*3/uL (ref 150–400)
RBC: 4.72 MIL/uL (ref 3.87–5.11)
RDW: 14.9 % (ref 11.5–15.5)
WBC: 6.6 10*3/uL (ref 4.0–10.5)
nRBC: 0 % (ref 0.0–0.2)

## 2019-03-25 LAB — GLUCOSE, CAPILLARY: Glucose-Capillary: 153 mg/dL — ABNORMAL HIGH (ref 70–99)

## 2019-03-25 NOTE — Progress Notes (Signed)
Konrad Felix PA  Patient takes ASA per Dr. [place] Eligah East from Pharmacy instructed her to stop ASA and suppliments 6/23. Pt's BP was elevated at PAT but she didn't take her medication and had salt with her breakfast.

## 2019-03-28 ENCOUNTER — Other Ambulatory Visit (HOSPITAL_COMMUNITY)
Admission: RE | Admit: 2019-03-28 | Discharge: 2019-03-28 | Disposition: A | Discharge status: Home / Self Care | Payer: Medicare Other | Source: Ambulatory Visit | Attending: Orthopaedic Surgery | Admitting: Orthopaedic Surgery

## 2019-03-28 DIAGNOSIS — Z01812 Encounter for preprocedural laboratory examination: Secondary | ICD-10-CM | POA: Diagnosis not present

## 2019-03-28 LAB — SARS CORONAVIRUS 2 (TAT 6-24 HRS): SARS Coronavirus 2: NEGATIVE

## 2019-03-30 NOTE — Anesthesia Preprocedure Evaluation (Addendum)
Anesthesia Evaluation  Patient identified by MRN, date of birth, ID band Patient awake    Reviewed: Allergy & Precautions, NPO status , Patient's Chart, lab work & pertinent test results  Airway Mallampati: II  TM Distance: >3 FB Neck ROM: Full    Dental no notable dental hx.    Pulmonary shortness of breath, asthma ,    Pulmonary exam normal breath sounds clear to auscultation       Cardiovascular hypertension, Pt. on medications negative cardio ROS Normal cardiovascular exam Rhythm:Regular Rate:Normal     Neuro/Psych  Headaches, Anxiety Depression negative psych ROS   GI/Hepatic Neg liver ROS, GERD  ,  Endo/Other  negative endocrine ROSdiabetes, Type 2  Renal/GU negative Renal ROS  negative genitourinary   Musculoskeletal  (+) Arthritis , Osteoarthritis,    Abdominal (+) + obese,   Peds negative pediatric ROS (+)  Hematology negative hematology ROS (+)   Anesthesia Other Findings   Reproductive/Obstetrics negative OB ROS                            Anesthesia Physical Anesthesia Plan  ASA: III  Anesthesia Plan: General   Post-op Pain Management:    Induction: Intravenous  PONV Risk Score and Plan: 3 and Ondansetron, Dexamethasone and Midazolam  Airway Management Planned: LMA  Additional Equipment:   Intra-op Plan:   Post-operative Plan: Extubation in OR  Informed Consent: I have reviewed the patients History and Physical, chart, labs and discussed the procedure including the risks, benefits and alternatives for the proposed anesthesia with the patient or authorized representative who has indicated his/her understanding and acceptance.     Dental advisory given  Plan Discussed with: CRNA  Anesthesia Plan Comments: (See PAT note 03/25/2019, Konrad Felix, PA-C)       Anesthesia Quick Evaluation

## 2019-03-30 NOTE — Progress Notes (Addendum)
Anesthesia Chart Review   Case: [id number] Date/Time: 04/01/19 1330   Procedure: KNEE ARTHROSCOPY WITH MEDIAL MENISECTOMY (Right )   Anesthesia type: Choice   Pre-op diagnosis: TEAR OF MEDIAL MENISCUS   Location: [place] / [facility]   Surgeon: Hiram Gash, MD      DISCUSSION:48 y.o. never smoker with h/o HTN, ILD (O2 2lpm hs and as needed daytime, on chronic steroids, will need stress dose per Dr. Melvyn Novas), DM II, depression, GERD, dyslipidemia, right medial meniscus tear scheduled for above procedure 04/01/2019 with Dr. Ophelia Charter.     Pt last seen by PCP, Dr. Billey Gosling, 03/13/2019.  Per OV note, "Chronic medical problems well controlled and stable.  She has no known coronary artery disease and no symptoms consistent with coronary artery disease with her daily activities. Pulmonary disease stable.  She will not be having general anesthesia, which is a good idea given her pulmonary condition.  No further evaluation or testing needed.  Stable for surgery-medically and cardiac cleared."  Clearance received from Pulmonologist, Dr. Melvyn Novas, 03/16/2019 which states pt is optimized for surgery from a medical standpoint only and will need stress dose of steroids.  Pt on chronic prednisone due to ILD.  Elevated BP at PAT 03/25/2019.  Pt reported to nurse she had not taken HTN medications, asymptomatic.  Will evaluate DOS.   Anticipate pt can proceed with planned procedure barring acute status change.   VS: BP (!) 142/102 (BP Location: Left Arm)   Pulse 90   Temp 37 C (Oral)   Resp 18   Ht 5' (1.524 m)   Wt 112.6 kg   SpO2 98%   BMI 48.50 kg/m   PROVIDERS: Binnie Rail, MD is PCP   Christinia Gully, MD is Pulmonologist last seen 12/05/2018 LABS: Labs reviewed: Acceptable for surgery. (all labs ordered are listed, but only abnormal results are displayed)  Labs Reviewed  GLUCOSE, CAPILLARY - Abnormal; Notable for the following components:      Result Value   Glucose-Capillary 153 (*)    All other components within normal limits  BASIC METABOLIC PANEL - Abnormal; Notable for the following components:   Glucose, Bld 171 (*)    All other components within normal limits  CBC     IMAGES:   EKG: 03/06/2019 Rate 90 bpm Sinus rhythm short PR syndrom Low voltage in precordial leads   CV: Cardiac Cath 01/30/2017 1. Normal right and left heart filling pressures.  2. No pulmonary hypertension.   Echo 01/24/17 Study Conclusions  - Left ventricle: The cavity size was normal. Wall thickness was   increased in a pattern of mild LVH. Systolic function was normal.   The estimated ejection fraction was in the range of 60% to 65%.   Wall motion was normal; there were no regional wall motion   abnormalities. Doppler parameters are consistent with abnormal   left ventricular relaxation (grade 1 diastolic dysfunction). - Aortic valve: There was no stenosis. - Mitral valve: There was no significant regurgitation. - Right ventricle: The cavity size was normal. Systolic function   was mildly reduced. - Tricuspid valve: Peak RV-RA gradient (S): 24 mm Hg. - Systemic veins: IVC not well-visualized.  Impressions:  - Normal LV size with mild LV hypertrophy. EF 60-65%. Normal RV   size with mildly decreased systolic function. PA pressure not   definitely elevated but TR jet not well-visualized. Past Medical History:  Diagnosis Date  . Allergic rhinitis   . Arthritis  Knees ankles  feet  . Bronchitis   . Chronic rhinitis   . Cough   . Dyslipidemia   . Dysphagia   . Dyspnea    occationally over last 2 years  . GERD (gastroesophageal reflux disease)   . Headache(784.0)   . ILD (interstitial lung disease) ([place])   . Mild depression ([place])   . Morbid obesity ([place])   . Type II or diabetes mellitus, uncontrolled 11/2012 dx   Dx 12/08/12: a1c 10.0  . Unspecified essential hypertension   . Urge incontinence     Past Surgical History:  Procedure Laterality Date  . ABDOMINAL  HYSTERECTOMY    . BILATERAL VATS ABLATION  02/15/04  . CESAREAN SECTION    . LUNG BIOPSY    . RIGHT HEART CATH N/A 01/30/2017   Procedure: Right Heart Cath;  Surgeon: Larey Dresser, MD;  Location: [place] [facility];  Service: Cardiovascular;  Laterality: N/A;    MEDICATIONS: . acetaminophen (TYLENOL) 500 MG tablet  . albuterol (PROVENTIL HFA;VENTOLIN HFA) 108 (90 Base) MCG/ACT inhaler  . APPLE CIDER VINEGAR PO  . aspirin 81 MG chewable tablet  . atorvastatin (LIPITOR) 20 MG tablet  . B COMPLEX VITAMINS SL  . calcium-vitamin D (OSCAL WITH D) 500-200 MG-UNIT tablet  . celecoxib (CELEBREX) 100 MG capsule  . Dextromethorphan-guaiFENesin (MUCINEX DM MAXIMUM STRENGTH) 60-1200 MG TB12  . ELDERBERRY PO  . empagliflozin (JARDIANCE) 25 MG TABS tablet  . fluticasone (FLONASE) 50 MCG/ACT nasal spray  . glucose blood (ONE TOUCH ULTRA TEST) test strip  . ibuprofen (ADVIL,MOTRIN) 600 MG tablet  . loratadine (CLARITIN) 10 MG tablet  . losartan (COZAAR) 25 MG tablet  . meloxicam (MOBIC) 15 MG tablet  . montelukast (SINGULAIR) 10 MG tablet  . ondansetron (ZOFRAN ODT) 8 MG disintegrating tablet  . OXYGEN  . pantoprazole (PROTONIX) 40 MG tablet  . predniSONE (DELTASONE) 5 MG tablet  . Respiratory Therapy Supplies (FLUTTER) DEVI  . SitaGLIPtin-MetFORMIN HCl (JANUMET XR) [phone] MG TB24  . TRULICITY 1.5 FG/1.8EX SOPN  . Turmeric 500 MG CAPS  . venlafaxine XR (EFFEXOR XR) 37.5 MG 24 hr capsule  . VITRON-C 65-125 MG TABS   No current facility-administered medications for this encounter.      Maia Plan [facility] Pre-Surgical Testing [phone] 03/30/19  12:12 PM

## 2019-04-01 ENCOUNTER — Ambulatory Visit (HOSPITAL_COMMUNITY): Payer: Medicare Other | Admitting: Anesthesiology

## 2019-04-01 ENCOUNTER — Ambulatory Visit (HOSPITAL_COMMUNITY): Payer: Medicare Other | Admitting: Physician Assistant

## 2019-04-01 ENCOUNTER — Encounter (HOSPITAL_COMMUNITY)
Admission: RE | Disposition: A | Discharge status: Home / Self Care | Payer: Self-pay | Source: Other Acute Inpatient Hospital | Attending: Orthopaedic Surgery

## 2019-04-01 ENCOUNTER — Ambulatory Visit (HOSPITAL_COMMUNITY)
Admission: RE | Admit: 2019-04-01 | Discharge: 2019-04-01 | Disposition: A | Discharge status: Home / Self Care | Payer: Medicare Other | Source: Other Acute Inpatient Hospital | Attending: Orthopaedic Surgery | Admitting: Orthopaedic Surgery

## 2019-04-01 DIAGNOSIS — E119 Type 2 diabetes mellitus without complications: Secondary | ICD-10-CM | POA: Diagnosis not present

## 2019-04-01 DIAGNOSIS — J849 Interstitial pulmonary disease, unspecified: Secondary | ICD-10-CM | POA: Diagnosis not present

## 2019-04-01 DIAGNOSIS — Z7984 Long term (current) use of oral hypoglycemic drugs: Secondary | ICD-10-CM | POA: Diagnosis not present

## 2019-04-01 DIAGNOSIS — M19071 Primary osteoarthritis, right ankle and foot: Secondary | ICD-10-CM | POA: Insufficient documentation

## 2019-04-01 DIAGNOSIS — M19072 Primary osteoarthritis, left ankle and foot: Secondary | ICD-10-CM | POA: Insufficient documentation

## 2019-04-01 DIAGNOSIS — I10 Essential (primary) hypertension: Secondary | ICD-10-CM | POA: Diagnosis not present

## 2019-04-01 DIAGNOSIS — Z79899 Other long term (current) drug therapy: Secondary | ICD-10-CM | POA: Diagnosis not present

## 2019-04-01 DIAGNOSIS — M170 Bilateral primary osteoarthritis of knee: Secondary | ICD-10-CM | POA: Diagnosis not present

## 2019-04-01 DIAGNOSIS — X58XXXA Exposure to other specified factors, initial encounter: Secondary | ICD-10-CM | POA: Insufficient documentation

## 2019-04-01 DIAGNOSIS — Z791 Long term (current) use of non-steroidal anti-inflammatories (NSAID): Secondary | ICD-10-CM | POA: Insufficient documentation

## 2019-04-01 DIAGNOSIS — S83241A Other tear of medial meniscus, current injury, right knee, initial encounter: Secondary | ICD-10-CM | POA: Diagnosis present

## 2019-04-01 DIAGNOSIS — E785 Hyperlipidemia, unspecified: Secondary | ICD-10-CM | POA: Diagnosis not present

## 2019-04-01 DIAGNOSIS — J45909 Unspecified asthma, uncomplicated: Secondary | ICD-10-CM | POA: Diagnosis not present

## 2019-04-01 DIAGNOSIS — F329 Major depressive disorder, single episode, unspecified: Secondary | ICD-10-CM | POA: Insufficient documentation

## 2019-04-01 DIAGNOSIS — F419 Anxiety disorder, unspecified: Secondary | ICD-10-CM | POA: Insufficient documentation

## 2019-04-01 DIAGNOSIS — Z7982 Long term (current) use of aspirin: Secondary | ICD-10-CM | POA: Diagnosis not present

## 2019-04-01 DIAGNOSIS — K219 Gastro-esophageal reflux disease without esophagitis: Secondary | ICD-10-CM | POA: Diagnosis not present

## 2019-04-01 HISTORY — PX: KNEE ARTHROSCOPY WITH MEDIAL MENISECTOMY: SHX5651

## 2019-04-01 LAB — GLUCOSE, CAPILLARY: Glucose-Capillary: 73 mg/dL (ref 70–99)

## 2019-04-01 SURGERY — ARTHROSCOPY, KNEE, WITH MEDIAL MENISCECTOMY
Anesthesia: General | Laterality: Right

## 2019-04-01 MED ORDER — ONDANSETRON HCL 4 MG PO TABS: 4.0000 mg | ORAL_TABLET | Freq: Three times a day (TID) | ORAL | 1 refills | Status: AC | PRN

## 2019-04-01 MED ORDER — LACTATED RINGERS IV SOLN
INTRAVENOUS | Status: DC
  Administered 2019-04-01: 12:00:00 via INTRAVENOUS

## 2019-04-01 MED ORDER — PHENYLEPHRINE 40 MCG/ML (10ML) SYRINGE FOR IV PUSH (FOR BLOOD PRESSURE SUPPORT)
PREFILLED_SYRINGE | INTRAVENOUS | Status: DC | PRN
  Administered 2019-04-01 (×2): 80 ug via INTRAVENOUS

## 2019-04-01 MED ORDER — MIDAZOLAM HCL 5 MG/5ML IJ SOLN
INTRAMUSCULAR | Status: DC | PRN
  Administered 2019-04-01: 2 mg via INTRAVENOUS

## 2019-04-01 MED ORDER — BUPIVACAINE-EPINEPHRINE (PF) 0.25% -1:200000 IJ SOLN
INTRAMUSCULAR | Status: AC
  Filled 2019-04-01: qty 30

## 2019-04-01 MED ORDER — DEXAMETHASONE SODIUM PHOSPHATE 10 MG/ML IJ SOLN
INTRAMUSCULAR | Status: DC | PRN
  Administered 2019-04-01: 4 mg via INTRAVENOUS

## 2019-04-01 MED ORDER — FENTANYL CITRATE (PF) 100 MCG/2ML IJ SOLN
INTRAMUSCULAR | Status: AC
  Filled 2019-04-01: qty 2

## 2019-04-01 MED ORDER — CLINDAMYCIN PHOSPHATE 900 MG/50ML IV SOLN
900.0000 mg | INTRAVENOUS | Status: AC
  Administered 2019-04-01: 900 mg via INTRAVENOUS
  Filled 2019-04-01: qty 50

## 2019-04-01 MED ORDER — MIDAZOLAM HCL 2 MG/2ML IJ SOLN
INTRAMUSCULAR | Status: AC
  Filled 2019-04-01: qty 2

## 2019-04-01 MED ORDER — BUPIVACAINE HCL 0.25 % IJ SOLN
INTRAMUSCULAR | Status: DC | PRN
  Administered 2019-04-01: 10 mL

## 2019-04-01 MED ORDER — CHLORHEXIDINE GLUCONATE 4 % EX LIQD: 60.0000 mL | Freq: Once | CUTANEOUS | Status: DC

## 2019-04-01 MED ORDER — PROPOFOL 10 MG/ML IV BOLUS
INTRAVENOUS | Status: DC | PRN
  Administered 2019-04-01: 150 mg via INTRAVENOUS
  Administered 2019-04-01: 50 mg via INTRAVENOUS

## 2019-04-01 MED ORDER — ACETAMINOPHEN 500 MG PO TABS: 1000.0000 mg | ORAL_TABLET | Freq: Three times a day (TID) | ORAL | 0 refills | Status: AC

## 2019-04-01 MED ORDER — LIDOCAINE 2% (20 MG/ML) 5 ML SYRINGE
INTRAMUSCULAR | Status: DC | PRN
  Administered 2019-04-01: 80 mg via INTRAVENOUS

## 2019-04-01 MED ORDER — ONDANSETRON HCL 4 MG/2ML IJ SOLN
INTRAMUSCULAR | Status: DC | PRN
  Administered 2019-04-01: 4 mg via INTRAVENOUS

## 2019-04-01 MED ORDER — PROPOFOL 10 MG/ML IV BOLUS
INTRAVENOUS | Status: AC
  Filled 2019-04-01: qty 20

## 2019-04-01 MED ORDER — HYDROMORPHONE HCL 1 MG/ML IJ SOLN
0.2500 mg | INTRAMUSCULAR | Status: DC | PRN
  Administered 2019-04-01 (×2): 0.5 mg via INTRAVENOUS

## 2019-04-01 MED ORDER — 0.9 % SODIUM CHLORIDE (POUR BTL) OPTIME
TOPICAL | Status: DC | PRN
  Administered 2019-04-01: 13:00:00 1000 mL

## 2019-04-01 MED ORDER — OXYCODONE HCL 5 MG PO TABS: 5.0000 mg | ORAL_TABLET | Freq: Once | ORAL | Status: DC | PRN

## 2019-04-01 MED ORDER — PROMETHAZINE HCL 25 MG/ML IJ SOLN: 6.2500 mg | INTRAMUSCULAR | Status: DC | PRN

## 2019-04-01 MED ORDER — SODIUM CHLORIDE 0.9 % IR SOLN
Status: DC | PRN
  Administered 2019-04-01: 6000 mL

## 2019-04-01 MED ORDER — OXYCODONE HCL 5 MG PO TABS: ORAL_TABLET | ORAL | 0 refills | Status: AC

## 2019-04-01 MED ORDER — CELECOXIB 100 MG PO CAPS: 100.0000 mg | ORAL_CAPSULE | Freq: Two times a day (BID) | ORAL | 2 refills | Status: DC

## 2019-04-01 MED ORDER — FENTANYL CITRATE (PF) 100 MCG/2ML IJ SOLN
INTRAMUSCULAR | Status: DC | PRN
  Administered 2019-04-01 (×6): 50 ug via INTRAVENOUS

## 2019-04-01 MED ORDER — HYDROMORPHONE HCL 1 MG/ML IJ SOLN
INTRAMUSCULAR | Status: AC
  Filled 2019-04-01: qty 2

## 2019-04-01 MED ORDER — OXYCODONE HCL 5 MG/5ML PO SOLN: 5.0000 mg | Freq: Once | ORAL | Status: DC | PRN

## 2019-04-01 MED ORDER — ASPIRIN 81 MG PO CHEW: 81.0000 mg | CHEWABLE_TABLET | Freq: Two times a day (BID) | ORAL | 0 refills | Status: DC

## 2019-04-01 SURGICAL SUPPLY — 43 items
BANDAGE ACE 6X5 VEL STRL LF (GAUZE/BANDAGES/DRESSINGS) ×2 IMPLANT
BANDAGE ESMARK 6X9 LF (GAUZE/BANDAGES/DRESSINGS) IMPLANT
BLADE CLIPPER SURG (BLADE) IMPLANT
BLADE EXCALIBUR 4.0X13 (MISCELLANEOUS) IMPLANT
BNDG CMPR 82X61 PLY HI ABS (GAUZE/BANDAGES/DRESSINGS) ×1
BNDG CMPR 9X6 STRL LF SNTH (GAUZE/BANDAGES/DRESSINGS)
BNDG CONFORM 6X.82 1P STRL (GAUZE/BANDAGES/DRESSINGS) ×1 IMPLANT
BNDG ESMARK 6X9 LF (GAUZE/BANDAGES/DRESSINGS)
CONT SPEC 4OZ CLIKSEAL STRL BL (MISCELLANEOUS) IMPLANT
COVER SURGICAL LIGHT HANDLE (MISCELLANEOUS) ×2 IMPLANT
COVER WAND RF STERILE (DRAPES) ×2 IMPLANT
CUFF TOURN SGL QUICK 34 (TOURNIQUET CUFF)
CUFF TRNQT CYL 34X4.125X (TOURNIQUET CUFF) IMPLANT
DISSECTOR 4.0MMX13CM CVD (MISCELLANEOUS) ×1 IMPLANT
DRAPE ARTHROSCOPY W/POUCH 114 (DRAPES) ×2 IMPLANT
DRAPE U-SHAPE 47X51 STRL (DRAPES) IMPLANT
DRSG PAD ABDOMINAL 8X10 ST (GAUZE/BANDAGES/DRESSINGS) ×1 IMPLANT
DURAPREP 26ML APPLICATOR (WOUND CARE) ×2 IMPLANT
EVACUATOR 1/8 PVC DRAIN (DRAIN) IMPLANT
GAUZE SPONGE 4X4 12PLY STRL (GAUZE/BANDAGES/DRESSINGS) ×1 IMPLANT
GAUZE XEROFORM 1X8 LF (GAUZE/BANDAGES/DRESSINGS) ×2 IMPLANT
GLOVE BIOGEL PI IND STRL 8 (GLOVE) ×1 IMPLANT
GLOVE BIOGEL PI INDICATOR 8 (GLOVE) ×1
GOWN STRL REUS W/ TWL LRG LVL3 (GOWN DISPOSABLE) ×1 IMPLANT
GOWN STRL REUS W/ TWL XL LVL3 (GOWN DISPOSABLE) ×1 IMPLANT
GOWN STRL REUS W/TWL LRG LVL3 (GOWN DISPOSABLE) ×2
GOWN STRL REUS W/TWL XL LVL3 (GOWN DISPOSABLE) ×2
KIT TURNOVER KIT A (KITS) ×2 IMPLANT
MANIFOLD NEPTUNE II (INSTRUMENTS) ×2 IMPLANT
NS IRRIG 1000ML POUR BTL (IV SOLUTION) IMPLANT
PACK ARTHROSCOPY DSU (CUSTOM PROCEDURE TRAY) ×2 IMPLANT
PAD ARMBOARD 7.5X6 YLW CONV (MISCELLANEOUS) ×4 IMPLANT
PADDING CAST COTTON 6X4 STRL (CAST SUPPLIES) IMPLANT
SPONGE LAP 4X18 RFD (DISPOSABLE) ×2 IMPLANT
STRIP CLOSURE SKIN 1/2X4 (GAUZE/BANDAGES/DRESSINGS) ×2 IMPLANT
SUT ETHILON 3 0 PS 1 (SUTURE) IMPLANT
SUT MNCRL AB 4-0 PS2 18 (SUTURE) ×2 IMPLANT
SYR CONTROL 10ML LL (SYRINGE) ×2 IMPLANT
TOWEL OR 17X26 10 PK STRL BLUE (TOWEL DISPOSABLE) ×4 IMPLANT
TUBING ARTHROSCOPY IRRIG 16FT (MISCELLANEOUS) ×2 IMPLANT
TUBING CONNECTING 10 (TUBING) ×2 IMPLANT
WAND STAR VAC 90 (SURGICAL WAND) IMPLANT
WATER STERILE IRR 1000ML POUR (IV SOLUTION) ×2 IMPLANT

## 2019-04-01 NOTE — H&P (Signed)
PREOPERATIVE H&P  Chief Complaint: TEAR OF MEDIAL MENISCUS  HPI: [name] is a 48 y.o. female who presents for preoperative history and physical with a diagnosis of TEAR OF MEDIAL MENISCUS. Symptoms are rated as moderate to severe, and have been worsening.  This is significantly impairing activities of daily living.  Please see my clinic note for full details on this patient's care.  She has elected for surgical management.   Past Medical History:  Diagnosis Date  . Allergic rhinitis   . Arthritis    Knees ankles  feet  . Bronchitis   . Chronic rhinitis   . Cough   . Dyslipidemia   . Dysphagia   . Dyspnea    occationally over last 2 years  . GERD (gastroesophageal reflux disease)   . Headache(784.0)   . ILD (interstitial lung disease) ([place])   . Mild depression ([place])   . Morbid obesity ([place])   . Type II or diabetes mellitus, uncontrolled 11/2012 dx   Dx 12/08/12: a1c 10.0  . Unspecified essential hypertension   . Urge incontinence    Past Surgical History:  Procedure Laterality Date  . ABDOMINAL HYSTERECTOMY    . BILATERAL VATS ABLATION  02/15/04  . CESAREAN SECTION    . LUNG BIOPSY    . RIGHT HEART CATH N/A 01/30/2017   Procedure: Right Heart Cath;  Surgeon: Larey Dresser, MD;  Location: [facility];  Service: Cardiovascular;  Laterality: N/A;   Social History   Socioeconomic History  . Marital status: Married    Spouse name: Not on file  . Number of children: 1  . Years of education: Not on file  . Highest education level: Not on file  Occupational History  . 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 Financial resource strain: Not hard at all  . Food insecurity    Worry: Never true    Inability: Never true  . Transportation needs    Medical: No    Non-medical: No  Tobacco Use  . Smoking status: Never Smoker  . Smokeless tobacco: Never Used  . Tobacco comment: {  Substance and  Sexual Activity  . Alcohol use: No    Alcohol/week: 0.0 standard drinks  . Drug use: No  . Sexual activity: Never  Lifestyle  . Physical activity    Days per week: 3 days    Minutes per session: 40 min  . Stress: To some extent  Relationships  . Social connections    Talks on phone: More than three times a week    Gets together: More than three times a week    Attends religious service: More than 4 times per year    Active member of club or organization: Not on file    Attends meetings of clubs or organizations: More than 4 times per year    Relationship status: Married  Other Topics Concern  . Not on file  Social History Narrative  . Not on file   Family History  Problem Relation Age of Onset  . Sarcoidosis Mother        dxed by transbrochial bx  . Allergies Mother   . Heart disease Father   . Diabetes Father   . Allergies Father   . Coronary artery disease Father   . Cancer Maternal Grandmother        CA of unknown type  . Cancer  Paternal Grandmother        CA of unknown type   Allergies  Allergen Reactions  . Penicillins Shortness Of Breath, Swelling and Other (See Comments)    Has patient had a PCN reaction causing immediate rash, facial/tongue/throat swelling, SOB or lightheadedness with hypotension: yes Has patient had a PCN reaction causing severe rash involving mucus membranes or skin necrosis: no Has patient had a PCN reaction that required hospitalization no Has patient had a PCN reaction occurring within the last 10 years: no If all of the above answers are "NO", then may proceed with Cephalosporin use.   Marland Kitchen Peach Flavor Hives and Other (See Comments)    Fresh peaches only  . Levaquin [Levofloxacin] Nausea Only    Nausea, bloating   Prior to Admission medications   Medication Sig Start Date End Date Taking? Authorizing Provider  acetaminophen (TYLENOL) 500 MG tablet Take 1,000 mg by mouth every 6 (six) hours as needed (pain.).   Yes [provider]  albuterol (PROVENTIL HFA;VENTOLIN HFA) 108 (90 Base) MCG/ACT inhaler Inhale 2 puffs into the lungs every 4 (four) hours as needed for wheezing or shortness of breath. 07/08/17  Yes Mabe, Shanon Brow, NP  APPLE CIDER VINEGAR PO Take 450 mg by mouth daily.   Yes [provider]  aspirin 81 MG chewable tablet Chew 81 mg by mouth daily.   Yes [provider]  atorvastatin (LIPITOR) 20 MG tablet TAKE ONE TABLET BY MOUTH DAILY Patient taking differently: Take 20 mg by mouth daily.  12/19/18  Yes Burns, Claudina Lick, MD  B COMPLEX VITAMINS SL Place 1 mL under the tongue 4 (four) times a week.   Yes [provider]  calcium-vitamin D (OSCAL WITH D) 500-200 MG-UNIT tablet Take 1 tablet by mouth daily.   Yes [provider]  celecoxib (CELEBREX) 100 MG capsule Take 100 mg by mouth at bedtime.    Yes [provider]  ELDERBERRY PO Take 1,000 mg by mouth daily.   Yes [provider]  empagliflozin (JARDIANCE) 25 MG TABS tablet Take 1 tablet by mouth every morning with or without food. Patient taking differently: Take 25 mg by mouth daily. Take 1 tablet by mouth every morning with or without food. 12/10/18  Yes Burns, Claudina Lick, MD  fluticasone (FLONASE) 50 MCG/ACT nasal spray Place 2 sprays into both nostrils daily as needed for allergies or rhinitis.   Yes [provider]  glucose blood (ONE TOUCH ULTRA TEST) test strip USE TO CHECK BLOOD SUGARS TWO TIMES A DAY 06/16/18  Yes Burns, Claudina Lick, MD  loratadine (CLARITIN) 10 MG tablet Take 10 mg by mouth at bedtime.    Yes [provider]  losartan (COZAAR) 25 MG tablet TAKE ONE-HALF TABLET BY MOUTH DAILY Patient taking differently: Take 12.5 mg by mouth daily.  05/14/18  Yes Burns, Claudina Lick, MD  meloxicam (MOBIC) 15 MG tablet Take 15 mg by mouth daily.   Yes [provider]  montelukast (SINGULAIR) 10 MG tablet Take 1 tablet (10 mg total) by mouth at bedtime. Patient taking differently: Take 10 mg  by mouth at bedtime as needed (alleriges.).  02/02/16  Yes Burns, Claudina Lick, MD  OXYGEN 2lpm with sleep and 3lpm with exertion   Yes [provider]  pantoprazole (PROTONIX) 40 MG tablet TAKE ONE TABLET BY MOUTH TWICE A DAY BEFORE MEALS Patient taking differently: Take 40 mg by mouth 2 (two) times a day.  01/29/19  Yes Burns, Claudina Lick,  MD  predniSONE (DELTASONE) 5 MG tablet Take 5 mg by mouth daily.  07/23/17  Yes [provider]  SitaGLIPtin-MetFORMIN HCl (JANUMET XR) [phone] MG TB24 Take 1 tablet by mouth daily. 10/28/18  Yes Burns, Claudina Lick, MD  TRULICITY 1.5 JQ/3.0SP SOPN INJECT 1.5 MG UNDER THE SKIN ONCE WEEKLY Patient taking differently: Inject 1.5 mg into the skin every Tuesday.  03/11/18  Yes Burns, Claudina Lick, MD  Turmeric 500 MG CAPS Take 500 mg by mouth daily.   Yes [provider]  venlafaxine XR (EFFEXOR XR) 37.5 MG 24 hr capsule Take 1 capsule (37.5 mg total) by mouth daily with breakfast. 03/13/19  Yes Burns, Claudina Lick, MD  VITRON-C 65-125 MG TABS Take 1 tablet by mouth daily.   Yes [provider]  Dextromethorphan-guaiFENesin (MUCINEX DM MAXIMUM STRENGTH) 60-1200 MG TB12 Take 1 tablet by mouth every 12 (twelve) hours as needed (with flutter valve).    [provider]  ibuprofen (ADVIL,MOTRIN) 600 MG tablet Take 1 tablet (600 mg total) by mouth every 6 (six) hours as needed. Patient not taking: Reported on 03/24/2019 12/17/18   Melynda Ripple, MD  ondansetron (ZOFRAN ODT) 8 MG disintegrating tablet 1/2- 1 tablet q 8 hr prn nausea, vomiting Patient taking differently: Take 4-8 mg by mouth every 8 (eight) hours as needed for nausea or vomiting.  12/17/18   Melynda Ripple, MD  Respiratory Therapy Supplies (FLUTTER) DEVI Use as directed 07/12/17   Tanda Rockers, MD  DETROL LA 2 MG 24 hr capsule TAKE 1 CAPSULE BY MOUTH DAILY 06/24/12 07/21/13  Rowe Clack, MD     Positive ROS: All other systems have been reviewed and were otherwise negative  with the exception of those mentioned in the HPI and as above.  Physical Exam: General: Alert, no acute distress Cardiovascular: No pedal edema Respiratory: No cyanosis, no use of accessory musculature GI: No organomegaly, abdomen is soft and non-tender Skin: No lesions in the area of chief complaint Neurologic: Sensation intact distally Psychiatric: Patient is competent for consent with normal mood and affect Lymphatic: No axillary or cervical lymphadenopathy  MUSCULOSKELETAL: R knee medially ttp, + mcmurrays  Assessment: TEAR OF MEDIAL MENISCUS  Plan: Plan for Procedure(s): KNEE ARTHROSCOPY WITH MEDIAL MENISECTOMY  The risks benefits and alternatives were discussed with the patient including but not limited to the risks of nonoperative treatment, versus surgical intervention including infection, bleeding, nerve injury,  blood clots, cardiopulmonary complications, morbidity, mortality, among others, and they were willing to proceed.   Hiram Gash, MD  04/01/2019 12:31 PM

## 2019-04-01 NOTE — Op Note (Signed)
Orthopaedic Surgery Operative Note (CSN: [id number])  [name]  09/27/1971 Date of Surgery: 04/01/2019   Diagnoses:  TEAR OF MEDIAL MENISCUS right knee  Procedure: Right knee arthroscopic partial medial meniscectomy and medial femoral and trochlear chondroplasty   Operative Finding Successful completion of planned procedure.  Exam under incision was normal.  ACL had some mild fraying of the anterior fibers but the overall substance of the ligament was intact.  Medial femoral condyle had grade 3 changes as well as grade 2 scattered throughout medial tibial plateau had grade 2 changes.  There was a radial tear of the posterior medial meniscus and a resection about 40% total meniscal volume was performed.  She has relatively few intact radial fibers remaining but we did not feel that the location of the tear was amenable to a root repair dressing the size of the patient as well as the level of articular damage she had.  Lateral compartment was intact.  She had grade 4 trochlear wear and grade 3 changes in the both debrided.  No loose bodies noted.  Patient fails this we would consider Visco supplementation or consideration for total versus unicompartmental knee arthroplasty.  She had relatively significant trochlear wear but this may not preclude things if one my partners wants to a medial uni-.  Post-operative plan: The patient will be weightbearing as tolerated.  The patient will be charged home.  DVT prophylaxis aspirin 81 mg twice daily.  Pain control with PRN pain medication preferring oral medicines.  Follow up plan will be scheduled in approximately 7 days for incision check.  Post-Op Diagnosis: Same Surgeons:Primary: Hiram Gash, MD Assistants Joya Gaskins, OPAC Location: [facility] Anesthesia: General with local anesthetic Antibiotics: Ancef 2g preop Tourniquet time: * No tourniquets in log * Estimated Blood Loss: Minimal Complications: None Specimens: None Implants: * No  implants in log *  Indications for Surgery:   [name] is a 48 y.o. female with continued right medial knee pain and mechanical symptoms refractory to nonoperative measures.  Benefits and risks of operative and nonoperative management were discussed prior to surgery with patient/guardian(s) and informed consent form was completed.  Specific risks including infection, need for additional surgery, post meniscectomy syndrome, arthrosis, worsening pain, stiffness and the need for further surgery   Procedure:   The patient was identified in the preoperative holding area where the surgical site was marked. The patient was taken to the OR where a procedural timeout was called and the above noted anesthesia was induced.  The patient was positioned prone on a regular bed with a lateral post.  Preoperative antibiotics were dosed.  The patient's right knee was prepped and draped in the usual sterile fashion.  A second preoperative timeout was called.      We began with standard anterior lateral and medial portals with spinal needle localization.  Ask arthroscopy performed as above.,  Gentle chondroplasty performed of the listed areas including the trochlea, the patella and the medial femoral and tibial condyles.  Performed a meniscectomy using a basket and shaver resecting the meniscus back to a stable base.  40% total meniscal volume removed and very few radial fibers still remaining this radial type tear posteriorly and the medial meniscus.   The incision was thoroughly irrigated and closed in a single layer fashion with absorbable sutures. A sterile dressing was placed.   The patient was awoken from general anesthesia and taken to the PACU in stable condition without complication.   Erlene Quan  Leslee Home, OPA-C, present and scrubbed throughout the case, critical for completion in a timely fashion, and for retraction, instrumentation, closure.

## 2019-04-01 NOTE — Anesthesia Postprocedure Evaluation (Signed)
Anesthesia Post Note  Patient: [name]  Procedure(s) Performed: KNEE ARTHROSCOPY WITH MEDIAL MENISECTOMY (Right )     Patient location during evaluation: PACU Anesthesia Type: General Level of consciousness: awake and alert Pain management: pain level controlled Vital Signs Assessment: post-procedure vital signs reviewed and stable Respiratory status: spontaneous breathing, nonlabored ventilation, respiratory function stable and patient connected to nasal cannula oxygen Cardiovascular status: blood pressure returned to baseline and stable Postop Assessment: no apparent nausea or vomiting Anesthetic complications: no    Last Vitals:  Vitals:   04/01/19 1430 04/01/19 1441  BP: (!) 134/92 134/66  Pulse: 83 82  Resp: 11 17  Temp:    SpO2: 100% 100%    Last Pain:  Vitals:   04/01/19 1441  TempSrc:   PainSc: 2                  Montez Hageman

## 2019-04-01 NOTE — Transfer of Care (Signed)
Immediate Anesthesia Transfer of Care Note  Patient: [name]  Procedure(s) Performed: KNEE ARTHROSCOPY WITH MEDIAL MENISECTOMY (Right )  Patient Location: PACU  Anesthesia Type:General  Level of Consciousness: awake, alert , oriented and patient cooperative  Airway & Oxygen Therapy: Patient Spontanous Breathing and Patient connected to face mask oxygen  Post-op Assessment: Report given to RN, Post -op Vital signs reviewed and stable and Patient moving all extremities  Post vital signs: Reviewed and stable  Last Vitals:  Vitals Value Taken Time  BP 146/96 04/01/19 1352  Temp    Pulse 94 04/01/19 1354  Resp 19 04/01/19 1354  SpO2 100 % 04/01/19 1354  Vitals shown include unvalidated device data.  Last Pain:  Vitals:   04/01/19 1155  TempSrc: Oral         Complications: No apparent anesthesia complications

## 2019-04-01 NOTE — Anesthesia Procedure Notes (Signed)
Procedure Name: LMA Insertion Date/Time: 04/01/2019 1:01 PM Performed by: Mitzie Na, CRNA Pre-anesthesia Checklist: Patient identified, Emergency Drugs available, Suction available, Patient being monitored and Timeout performed Patient Re-evaluated:Patient Re-evaluated prior to induction Oxygen Delivery Method: Circle system utilized Preoxygenation: Pre-oxygenation with 100% oxygen Induction Type: IV induction LMA: LMA inserted LMA Size: 4.0 Placement Confirmation: positive ETCO2 and breath sounds checked- equal and bilateral Tube secured with: Tape Dental Injury: Teeth and Oropharynx as per pre-operative assessment

## 2019-04-02 ENCOUNTER — Encounter (HOSPITAL_COMMUNITY): Payer: Self-pay | Admitting: Orthopaedic Surgery

## 2019-04-13 ENCOUNTER — Other Ambulatory Visit: Payer: Self-pay | Admitting: Internal Medicine

## 2019-04-14 ENCOUNTER — Other Ambulatory Visit: Payer: Self-pay | Admitting: Internal Medicine

## 2019-04-28 ENCOUNTER — Ambulatory Visit: Payer: Medicare Other | Admitting: Internal Medicine

## 2019-05-15 ENCOUNTER — Other Ambulatory Visit: Payer: Self-pay | Admitting: Internal Medicine

## 2019-06-09 ENCOUNTER — Other Ambulatory Visit: Payer: Self-pay

## 2019-06-09 ENCOUNTER — Ambulatory Visit: Payer: Medicare Other | Admitting: Internal Medicine

## 2019-06-09 ENCOUNTER — Other Ambulatory Visit: Payer: Self-pay | Admitting: Internal Medicine

## 2019-06-09 ENCOUNTER — Encounter: Payer: Self-pay | Admitting: Internal Medicine

## 2019-06-09 DIAGNOSIS — J9611 Chronic respiratory failure with hypoxia: Secondary | ICD-10-CM | POA: Diagnosis not present

## 2019-06-09 DIAGNOSIS — Z23 Encounter for immunization: Secondary | ICD-10-CM

## 2019-06-09 DIAGNOSIS — J8410 Pulmonary fibrosis, unspecified: Secondary | ICD-10-CM

## 2019-06-09 DIAGNOSIS — J309 Allergic rhinitis, unspecified: Secondary | ICD-10-CM

## 2019-06-09 DIAGNOSIS — J45991 Cough variant asthma: Secondary | ICD-10-CM

## 2019-06-09 NOTE — Patient Instructions (Addendum)
Please schedule a follow up visit in 6 months but call sooner if needed   Flu shot today    Please schedule a follow up visit in 6  months but call sooner if needed

## 2019-06-09 NOTE — Assessment & Plan Note (Signed)
Reviewed prns from med calendar list    Each maintenance medication was reviewed in detail including most importantly the difference between maintenance and as needed and under what circumstances the prns are to be used. This was done in the context of a medication calendar review which provided the patient with a user-friendly unambiguous mechanism for medication administration and reconciliation and provides an action plan for all active problems. It is critical that this be shown to every doctor  for modification during the office visit if necessary so the patient can use it as a working document  > 50% of this 25 min visit spent counseling I also performed device teaching  using a teach back technique which also  extended face to face time for this visit (see above)

## 2019-06-09 NOTE — Assessment & Plan Note (Signed)
Dx 2016 / clinical  - PFTs.  03/09/2015  14% better p B2 and less cough p saba - 03/09/2015   > try dulera 100 2bid  - 08/31/2016   try increase to 200 2bid due to cough > no better so back to 100 2bid maint  -  11/26/2017  After extensive coaching inhaler device  effectiveness =  75% ( short Ti )   - The proper method of use, as well as anticipated side effects, of a metered-dose inhaler are discussed and demonstrated to the patient.   Based on two studies from NEJM  378; 20 p 1865 (2018) and 380 : p2020-30 (2019) in pts with mild asthma it is reasonable to use low dose symbicort eg 80 (and by inference dulera 100) 2bid "prn" flare in this setting  and takes advantage of the rapid onset of action but is not the same as "rescue therapy" but can be stopped once the acute symptoms have resolved and the need for rescue has been minimized (< 2 x weekly)

## 2019-06-09 NOTE — Assessment & Plan Note (Signed)
Body mass index is 47.85 kg/m.  -  trending slt up still  Lab Results  Component Value Date   TSH 1.25 05/09/2017     Contributing to gerd risk/ doe/reviewed the need and the process to achieve and maintain neg calorie balance > defer f/u primary care including intermittently monitoring thyroid status

## 2019-06-09 NOTE — Assessment & Plan Note (Signed)
On 02 since 2011  As of 06/09/2019  rx = 2lpm hs and prn with activity up to 3lpm   Adequate control on present rx, reviewed in detail with pt > no change in rx needed  - advised 02 needs may go up as ex intensity increases

## 2019-06-09 NOTE — Progress Notes (Signed)
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  Since then waxing/waning sx requiring intermittent prednisone tapers with only minimal improvement - most of her symptoms chronically have been related to obesity with dyspnea on exertion and tendency to chronic cough which is felt to be at least partly  reflux related but improved with saba 03/09/2015 so started on dulera 100 2bid in addition to maint pred and seems to have helped but not typically  able to get < 10 mg pred s flare cough/sob     History of Present Illness  November 07, 2010 ov cc cough/ strangling  a bit more with taper off reglan (actually worse before ran out of the low dose reglan)  no excess mucus or increase sob on prednisone @  10 mg daily .  Using med calendar well rec 1)  Ok to resume water aerobics but pace yourself 2)  Wait until your swallowing evaulation is complete before making a decision re reglan > stopped it on her own.  3)  Try Prednisone 10 mg one-half each am   04/04/2012 f/u ov/[name] no longer using med calendar on Pred 10 mg one daily  added hyzaar to rx for hbp/leg swelling not back to mall walking yet,  No unusual cough, purulent sputum or sinus/hb symptoms on present rx. No variability to symptoms or need for inhaler. Rec Try prednisone 10 mg one half daily to see if affects your ability to walk the mall on 02 or the cough gets worse (walk the mall first for at least before you try) Always walk for exercise on 3lpm - this will help you burn fat and get into negative calorie balance to lose weight.   03/17/2013 f/u ov/[name] re NSIP on floor of 10 mg per day Chief Complaint  Patient presents with  . Follow-up    Breathing is overall doing well and she denies any new co's today.   ex on a treadmill x 46m x 3 x daily on 2lpm not the 3lpm as rec >>Prednisone  ceiling is 20 mg per day and floor is 10 mg per day             02/06/2016  f/u ov/[name] re: NSIP / 3lpm with ex/ duler 100 2bid and pred 10 mg daily  Chief Complaint  Patient presents with  . Follow-up    Pt states that she did have some issues with allergies but is improving. Pt also was having issues with Dulera, jittery after use, but has started rinsing her mouth after use and side effects have improved. Pt denies cough/wheeze/SOB/CP/tightness.   limited by L knee from doing treadmill, has trainer but not losing wt  Rhinitis symptoms better on singulair   rec Work on inhaler technique:    Should be able to tolerate dulera 100 Take 2 puffs first thing in am and then another 2 puffs about 12 hours later.  If so, try prednisone 10 - 5  = even days take a half, odd days take half   06/01/2016  f/u ov/[name] re: NSIP  Chief Complaint  Patient presents with  . Follow-up    Breathing is doing well. She denies any new co's today.   when ex 3lpm and back on treadmill x 25 min more  limited by knee 2-3x per week at 72mph flat / minimal dry daytime cough on nexium 20 mg bid ac  rec Work on inhaler technique:   Continue  dulera 100 Take 2 puffs first thing in am and then another 2 puffs about 12 hours later.  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 Follow-up    Increased cough x 2 wks  on treadmill x 3lpm x 25 min  Decreased pred Nov 13th and cough worse starting on 23rd  Cough worse at hs and early in am but non-productive  Nose feels dried out/ clogged up/ saline helps  rec Stay on singulair each evening Try dulera 200 Take 2 puffs first thing in am and then another 2 puffs about 12 hours later to see if helps or not by the time you return  Protonix 40 mg Take 30- 60 min before your first and last meals of the day   Prednisone 10 mg daily until better then 10 alternating with 5 mg daily     12/04/2018  f/u ov/[name] re: NSIP maint on pred 5 mg daily  Chief Complaint  Patient presents with  . Follow-up    PFT done today. Breathing is unchanged. She has not had to use her rescue inhaler.   Dyspnea:  Able to do church steps s any 02  which is an improvement  Cough reproducible always with deep breath/ dry  Sleeping: bed flat/ propped up to 30 degrees with pillows SABA use: none 02: 2lpm at hs /  3lpm ex and sats 98% per pt  rec No change    Knee surgery 04/01/2019 Right knee arthroscopic partial medial meniscectomy and medial femoral and trochlear chondroplasty     06/09/2019  f/u ov/[name] re: NSIP/ cough variant asthma on pred 5 mg daily and dulera 10 1 each am  Chief Complaint  Patient presents with  . Follow-up    Breathing is overall doing well. She has occ nasal congestion and cough in the am- relates to allergies. She rarely uses her albuterol.   Dyspnea:  Walking outdoor track total of two laps on 3lpm sats with sats 93-95% knee slows her down 1st  Cough: minimal / dry  Sleeping: 45 degrees with pillows / 2lpm   SABA use: rarely  02: as above    No obvious day to day or daytime variability or assoc excess/ purulent sputum or mucus plugs or hemoptysis or cp or chest tightness, subjective wheeze or overt sinus or hb symptoms.   Sleeping as above  without nocturnal  or early am exacerbation  of respiratory  c/o's or need for noct saba. Also denies any obvious fluctuation of symptoms with weather or environmental changes or other aggravating or alleviating factors except as outlined above   No unusual exposure hx or h/o childhood pna/ asthma or knowledge of premature birth.  Current Allergies, Complete Past Medical History, Past Surgical History, Family History, and Social History were reviewed in [employer] record.  ROS  The following are not active complaints unless  bolded Hoarseness, sore throat, dysphagia, dental problems, itching, sneezing,  nasal congestion or discharge of excess mucus or purulent secretions, ear ache,   fever, chills, sweats, unintended wt loss or wt gain, classically pleuritic or exertional cp,  orthopnea pnd or arm/hand swelling  or leg swelling, presyncope, palpitations, abdominal pain, anorexia, nausea, vomiting, diarrhea  or change in bowel habits or change in bladder habits, change in stools or change in urine, dysuria, hematuria,  rash, arthralgias, visual complaints, headache, numbness, weakness or ataxia or problems with walking or coordination,  change in mood or  memory.        Current Meds  Medication Sig  . albuterol (PROVENTIL HFA;VENTOLIN HFA) 108 (90 Base) MCG/ACT inhaler Inhale 2 puffs into the lungs every 4 (four) hours as needed for wheezing or shortness of breath.  . APPLE CIDER VINEGAR PO Take 450 mg by mouth daily.  Marland Kitchen aspirin 81 MG chewable tablet Chew 1 tablet (81 mg total) by mouth 2 (two) times daily.  Marland Kitchen atorvastatin (LIPITOR) 20 MG tablet TAKE ONE TABLET BY MOUTH DAILY (Patient taking differently: Take 20 mg by mouth daily. )  . B COMPLEX VITAMINS SL Place 1 mL under the tongue 4 (four) times a week.  . calcium-vitamin D (OSCAL WITH D) 500-200 MG-UNIT tablet Take 1 tablet by mouth daily.  . celecoxib (CELEBREX) 100 MG capsule Take 1 capsule (100 mg total) by mouth 2 (two) times daily.  Marland Kitchen Dextromethorphan-guaiFENesin (MUCINEX DM MAXIMUM STRENGTH) 60-1200 MG TB12 Take 1 tablet by mouth every 12 (twelve) hours as needed (with flutter valve).  Marland Kitchen ELDERBERRY PO Take 1,000 mg by mouth daily.  . empagliflozin (JARDIANCE) 25 MG TABS tablet Take 25 mg by mouth daily. Take 1 tablet by mouth every morning with or without food.  . fluticasone (FLONASE) 50 MCG/ACT nasal spray Place 2 sprays into both nostrils daily as needed for allergies or rhinitis.  Marland Kitchen glucose blood (ONE TOUCH ULTRA TEST) test strip USE TO CHECK BLOOD SUGARS  TWO TIMES A DAY  . JANUMET XR [phone] MG TB24 TAKE ONE TABLET BY MOUTH DAILY  . loratadine (CLARITIN) 10 MG tablet Take 10 mg by mouth at bedtime.   Marland Kitchen losartan (COZAAR) 25 MG tablet Take 0.5 tablets (12.5 mg total) by mouth daily.  . mometasone-formoterol (DULERA) 100-5 MCG/ACT AERO Inhale 2 puffs into the lungs 2 (two) times daily.  . montelukast (SINGULAIR) 10 MG tablet Take 1 tablet (10 mg total) by mouth at bedtime. (Patient taking differently: Take 10 mg by mouth at bedtime as needed (alleriges.). )  . OXYGEN 2lpm with sleep and 3lpm with exertion  . pantoprazole (PROTONIX) 40 MG tablet TAKE ONE TABLET BY MOUTH TWICE A DAY BEFORE MEALS (Patient taking differently: Take 40 mg by mouth 2 (two) times a day. )  . predniSONE (DELTASONE) 5 MG tablet Take 5 mg by mouth daily.   Marland Kitchen Respiratory Therapy Supplies (FLUTTER) DEVI Use as directed  . TRULICITY 1.5 0000000 SOPN INJECT 1.5 MG UNDER THE SKIN ONCE WEEKLY (Patient taking differently: Inject 1.5 mg into the skin every Tuesday. )  . Turmeric 500 MG CAPS Take 500 mg by mouth daily.  Marland Kitchen venlafaxine XR (EFFEXOR XR) 37.5 MG 24 hr capsule Take 1 capsule (37.5 mg total) by mouth daily with breakfast.  . VITRON-C 65-125 MG TABS Take 1 tablet by mouth daily.           Past Medical History: GASTROESOPHAGEAL REFLUX DISEASE     - Tapered reglan to 10 mg one half twice daily June 22, 2010 > d/c'd 11/2010  -restarted reglan 03/2011 >>unable to tolerate.  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)   -Restarted Prednisone 02/24/08   -Desats with >  2 laps 06/22/10 so rec wear 02 2lpm at bedtime and with ex  MORBID OBESITY   - Target wt  =  153  for BMI < 30  Hypertension Health Maintenance...........................................................  Burnsw    - Pneumovax July 22, 2009             Objective:   Physical Exam  amb obese bf nad    wt  305  May 28, 2008 >   302 November 07, 2010 >   303 03/22/2011 > 10/25/2011  299 > 311 04/04/2012 > 306 03/17/2013 >06/15/2013 303 > 10/06/2013 298  > 02/08/2014 289 > 10/06/2014   288 > 03/09/2015 283 > 10/21/2015    283 >  02/06/2016  284 > 06/01/2016  282 > 08/31/2016   281 > 07/12/2017   270 > 11/26/2017   263> 06/30/2018   252 > 12/04/2018  244 > 06/09/2019 245   Vital signs reviewed - Note on arrival 02 sats  100% on RA    HEENT: nl dentition, turbinates bilaterally, and oropharynx. Nl external ear canals without cough reflex   NECK :  without JVD/Nodes/TM/ nl carotid upstrokes bilaterally   LUNGS: no acc muscle use,  Nl contour chest which is clear to A and P bilaterally without cough on insp or exp maneuvers   CV:  RRR  no s3 or murmur or increase in P2, and no edema   ABD:  Obese soft and nontender with nl inspiratory excursion in the supine position. No bruits or organomegaly appreciated, bowel sounds nl  MS:  Nl gait/ ext warm without deformities, calf tenderness, cyanosis or clubbing No obvious joint restrictions   SKIN: warm and dry without lesions    NEURO:  alert, approp, nl sensorium with  no motor or cerebellar deficits apparent.           Assessment & Plan:

## 2019-06-09 NOTE — Assessment & Plan Note (Signed)
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)   -Restarted Prednisone 02/24/08   -PFT's December 23, 2008 VC 35%  DLC0 31%   -PFT's 02/08/2014   VC 1.30 (49%) and DLCO is 70%  > rec pred 10/5 > did not tolerate - PFTs  03/09/2015     VC 1.27 (48%) and dlco is 72% @ 10 mg daily  - 02/06/2016 try 10 a/w 5 mg daily > did not try - 06/01/2016 try 10 a/w 5 daily > cough  flared p 1 week on lower dose  -ANA positive 10/2016 >refer to rheumatology  -HRCT chest essentially stable changes since 2005 10/09/2016 >  Rheum eval 12/03/16 [facility]  And f/u q 3 mo as of 06/30/2018   - PFTs 12/04/2018  VC = 1.38 (54%)  And  dlco 13.72 =73% with corrects to 6.5 144 % on pred 5 mg daily = no significant change from prior  The goal with a chronic steroid dependent illness is always arriving at the lowest effective dose that controls the disease/symptoms and not accepting a set "formula" which is based on statistics or guidelines that don't always take into account patient  variability or the natural hx of the dz in every individual patient, which may well vary over time.  For now therefore I recommend the patient maintain  5 mg floor and 10 mg ceiling for flares

## 2019-06-14 ENCOUNTER — Other Ambulatory Visit: Payer: Self-pay | Admitting: Internal Medicine

## 2019-08-07 ENCOUNTER — Telehealth: Payer: Medicare Other

## 2019-08-07 ENCOUNTER — Ambulatory Visit
Admission: EM | Admit: 2019-08-07 | Discharge: 2019-08-07 | Disposition: A | Discharge status: Home / Self Care | Payer: Medicare Other | Attending: Emergency Medicine | Admitting: Emergency Medicine

## 2019-08-07 DIAGNOSIS — N23 Unspecified renal colic: Secondary | ICD-10-CM

## 2019-08-07 DIAGNOSIS — Z87442 Personal history of urinary calculi: Secondary | ICD-10-CM | POA: Diagnosis not present

## 2019-08-07 LAB — POCT URINALYSIS DIP (MANUAL ENTRY)
Bilirubin, UA: NEGATIVE
Blood, UA: NEGATIVE
Glucose, UA: 500 mg/dL — AB
Ketones, POC UA: NEGATIVE mg/dL
Leukocytes, UA: NEGATIVE
Nitrite, UA: NEGATIVE
Protein Ur, POC: NEGATIVE mg/dL
Spec Grav, UA: 1.02 (ref 1.010–1.025)
Urobilinogen, UA: 0.2 E.U./dL
pH, UA: 5.5 (ref 5.0–8.0)

## 2019-08-07 MED ORDER — KETOROLAC TROMETHAMINE 60 MG/2ML IM SOLN
60.0000 mg | Freq: Once | INTRAMUSCULAR | Status: AC
  Administered 2019-08-07: 10:00:00 60 mg via INTRAMUSCULAR

## 2019-08-07 MED ORDER — KETOROLAC TROMETHAMINE 10 MG PO TABS: 10.0000 mg | ORAL_TABLET | Freq: Four times a day (QID) | ORAL | 0 refills | Status: DC | PRN

## 2019-08-07 NOTE — Discharge Instructions (Signed)
Important to drink plenty of water: Use strainer when urinating to see if you catch stone. May take Toradol as directed for pain: Important that you hold your Celebrex take this medication as this could increase your risk of bleeding, GI upset. Go to ER for worsening pain, gross blood in urine, decreased urine output, fever.

## 2019-08-07 NOTE — ED Triage Notes (Signed)
Pt c/o rt flank pain x1wk, worse today. States years ago she had the same pain and had a kidney stone. States feels like her rt kidney is swollen

## 2019-08-07 NOTE — ED Provider Notes (Signed)
[name]    CSN: [id number] Arrival date & time: 08/07/19  0855      History   Chief Complaint Chief Complaint  Patient presents with  . Flank Pain    HPI [name] is a 48 y.o. female with history of type 2 diabetes, obesity, hypertension, arthritis presenting for right flank pain for the last week.  Patient presented today as it has worsened over the last 48 hours.  Patient does endorse history of renal calculi, 8-9 years ago, for which she was seen in the ER.  Patient does not have a urologist.  States this pain feels very similar.  Patient endorsing recent travel over the last week and states that she does not drink enough water when she is traveling.  Patient has been holding her Celebrex today in hopes that she could receive "a shot that they gave me in the ER for the pain ".  She denies decreased urine output, hematuria, abdominal pain, chest pain, shortness of breath, fever.   Past Medical History:  Diagnosis Date  . Allergic rhinitis   . Arthritis    Knees ankles  feet  . Bronchitis   . Chronic rhinitis   . Cough   . Dyslipidemia   . Dysphagia   . Dyspnea    occationally over last 2 years  . GERD (gastroesophageal reflux disease)   . Headache(784.0)   . ILD (interstitial lung disease) ([place])   . Mild depression ([place])   . Morbid obesity ([place])   . Type II or diabetes mellitus, uncontrolled 11/2012 dx   Dx 12/08/12: a1c 10.0  . Unspecified essential hypertension   . Urge incontinence     Patient Active Problem List   Diagnosis Date Noted  . Preop examination 03/06/2019  . Anxiety 02/05/2018  . Peroneal tendinitis of lower leg, right 10/22/2017  . Chronic respiratory failure with hypoxia ([place]) 07/20/2017  . Knee pain, left 02/02/2016  . Cough variant asthma 03/09/2015  . Diabetes type 2, uncontrolled ([place]) 12/09/2012  . Dyslipidemia   . Acute on chronic respiratory failure with hypoxia ([place]) 04/05/2012  . CONJUNCTIVITIS, ALLERGIC, SEASONAL  01/05/2010  . Allergic rhinitis 01/05/2010  . Essential hypertension 09/07/2009  . URINARY INCONTINENCE, URGE 03/24/2009  . CHRONIC RHINITIS 12/23/2008  . Morbid obesity due to excess calories ([place]) 10/10/2007  . INTERSTITIAL LUNG DISEASE 10/10/2007  . GASTROESOPHAGEAL REFLUX DISEASE 10/10/2007    Past Surgical History:  Procedure Laterality Date  . ABDOMINAL HYSTERECTOMY    . BILATERAL VATS ABLATION  02/15/04  . CESAREAN SECTION    . KNEE ARTHROSCOPY WITH MEDIAL MENISECTOMY Right 04/01/2019   Procedure: KNEE ARTHROSCOPY WITH MEDIAL MENISECTOMY;  Surgeon: Hiram Gash, MD;  Location: [facility];  Service: Orthopedics;  Laterality: Right;  . LUNG BIOPSY    . RIGHT HEART CATH N/A 01/30/2017   Procedure: Right Heart Cath;  Surgeon: Larey Dresser, MD;  Location: [facility];  Service: Cardiovascular;  Laterality: N/A;    OB History   No obstetric history on file.      Home Medications    Prior to Admission medications   Medication Sig Start Date End Date Taking? Authorizing Provider  albuterol (PROVENTIL HFA;VENTOLIN HFA) 108 (90 Base) MCG/ACT inhaler Inhale 2 puffs into the lungs every 4 (four) hours as needed for wheezing or shortness of breath. 07/08/17   Janne Napoleon, NP  APPLE CIDER VINEGAR PO Take 450 mg by mouth daily.    [provider]  aspirin 81 MG chewable tablet Chew 1 tablet (81 mg total) by mouth 2 (two) times daily. 04/01/19   Hiram Gash, MD  atorvastatin (LIPITOR) 20 MG tablet TAKE ONE TABLET BY MOUTH DAILY 06/15/19   Binnie Rail, MD  B COMPLEX VITAMINS SL Place 1 mL under the tongue 4 (four) times a week.    [provider]  calcium-vitamin D (OSCAL WITH D) 500-200 MG-UNIT tablet Take 1 tablet by mouth daily.    [provider]  celecoxib (CELEBREX) 100 MG capsule Take 1 capsule (100 mg total) by mouth 2 (two) times daily. 04/01/19 03/31/20  Hiram Gash, MD  Dextromethorphan-guaiFENesin (MUCINEX DM MAXIMUM STRENGTH) 60-1200 MG TB12 Take  1 tablet by mouth every 12 (twelve) hours as needed (with flutter valve).    [provider]  ELDERBERRY PO Take 1,000 mg by mouth daily.    [provider]  empagliflozin (JARDIANCE) 25 MG TABS tablet Take 25 mg by mouth daily. Take 1 tablet by mouth every morning with or without food. 04/13/19   Binnie Rail, MD  fluticasone (FLONASE) 50 MCG/ACT nasal spray Place 2 sprays into both nostrils daily as needed for allergies or rhinitis.    [provider]  glucose blood (ONE TOUCH ULTRA TEST) test strip USE TO CHECK BLOOD SUGARS TWO TIMES A DAY 06/16/18   Burns, Claudina Lick, MD  JANUMET XR [phone] MG TB24 TAKE ONE TABLET BY MOUTH DAILY 04/14/19   Binnie Rail, MD  ketorolac (TORADOL) 10 MG tablet Take 1 tablet (10 mg total) by mouth every 6 (six) hours as needed. 08/07/19   Hall-Potvin, [place], PA-C  loratadine (CLARITIN) 10 MG tablet Take 10 mg by mouth at bedtime.     [provider]  losartan (COZAAR) 25 MG tablet Take 0.5 tablets (12.5 mg total) by mouth daily. 05/18/19   Burns, Claudina Lick, MD  mometasone-formoterol (DULERA) 100-5 MCG/ACT AERO Inhale 2 puffs into the lungs 2 (two) times daily.    [provider]  montelukast (SINGULAIR) 10 MG tablet Take 1 tablet (10 mg total) by mouth at bedtime. Patient taking differently: Take 10 mg by mouth at bedtime as needed (alleriges.).  02/02/16   Binnie Rail, MD  OXYGEN 2lpm with sleep and 3lpm with exertion    [provider]  pantoprazole (PROTONIX) 40 MG tablet TAKE ONE TABLET BY MOUTH TWICE A DAY BEFORE MEALS Patient taking differently: Take 40 mg by mouth 2 (two) times a day.  01/29/19   Binnie Rail, MD  predniSONE (DELTASONE) 5 MG tablet Take 5 mg by mouth daily.  07/23/17   [provider]  Respiratory Therapy Supplies (FLUTTER) DEVI Use as directed 07/12/17   Tanda Rockers, MD  TRULICITY 1.5 0000000 SOPN INJECT 1.5 MILLIGRAM UNDER THE SKIN ONCE WEEKLY 06/09/19   Binnie Rail, MD   Turmeric 500 MG CAPS Take 500 mg by mouth daily.    [provider]  venlafaxine XR (EFFEXOR XR) 37.5 MG 24 hr capsule Take 1 capsule (37.5 mg total) by mouth daily with breakfast. 03/13/19   Burns, Claudina Lick, MD  VITRON-C 65-125 MG TABS Take 1 tablet by mouth daily.    [provider]    Family History Family History  Problem Relation Age of Onset  . Sarcoidosis Mother        dxed by transbrochial bx  . Allergies Mother   . Heart disease Father   . Diabetes Father   .  Allergies Father   . Coronary artery disease Father   . Cancer Maternal Grandmother        CA of unknown type  . Cancer Paternal Grandmother        CA of unknown type    Social History Social History   Tobacco Use  . Smoking status: Never Smoker  . Smokeless tobacco: Never Used  . Tobacco comment: {  Substance Use Topics  . Alcohol use: No    Alcohol/week: 0.0 standard drinks  . Drug use: No     Allergies   Penicillins, Peach flavor, and Levaquin [levofloxacin]   Review of Systems Review of Systems  Constitutional: Negative for fatigue and fever.  HENT: Negative for ear pain, sinus pain, sore throat and voice change.   Eyes: Negative for pain, redness and visual disturbance.  Respiratory: Negative for cough and shortness of breath.   Cardiovascular: Negative for chest pain and palpitations.  Gastrointestinal: Negative for abdominal pain, constipation, diarrhea and vomiting.  Genitourinary: Negative for dysuria, flank pain, frequency, hematuria, pelvic pain, urgency, vaginal bleeding, vaginal discharge and vaginal pain.  Musculoskeletal: Positive for back pain. Negative for arthralgias and myalgias.  Skin: Negative for rash and wound.  Neurological: Negative for syncope and headaches.     Physical Exam Triage Vital Signs ED Triage Vitals  Enc Vitals Group     BP 08/07/19 0908 (!) 157/92     Pulse Rate 08/07/19 0908 73     Resp 08/07/19 0908 18     Temp 08/07/19 0908 98 F  (36.7 C)     Temp Source 08/07/19 0908 Oral     SpO2 08/07/19 0908 98 %     Weight --      Height --      Head Circumference --      Peak Flow --      Pain Score 08/07/19 0909 6     Pain Loc --      Pain Edu? --      Excl. in [place]? --    No data found.  Updated Vital Signs BP (!) 157/92 (BP Location: Left Arm)   Pulse 73   Temp 98 F (36.7 C) (Oral)   Resp 18   SpO2 98%   Visual Acuity Right Eye Distance:   Left Eye Distance:   Bilateral Distance:    Right Eye Near:   Left Eye Near:    Bilateral Near:     Physical Exam Constitutional:      General: She is not in acute distress.    Appearance: She is obese. She is not ill-appearing.  HENT:     Head: Normocephalic and atraumatic.  Eyes:     General: No scleral icterus.    Pupils: Pupils are equal, round, and reactive to light.  Cardiovascular:     Rate and Rhythm: Normal rate.  Pulmonary:     Effort: Pulmonary effort is normal.  Abdominal:     General: Bowel sounds are normal.     Palpations: Abdomen is soft.     Tenderness: There is no abdominal tenderness. There is right CVA tenderness. There is no left CVA tenderness or guarding.  Skin:    Coloration: Skin is not jaundiced or pale.  Neurological:     Mental Status: She is alert and oriented to person, place, and time.      UC Treatments / Results  Labs (all labs ordered are listed, but only abnormal results are displayed) Labs Reviewed  POCT  URINALYSIS DIP (MANUAL ENTRY) - Abnormal; Notable for the following components:      Result Value   Glucose, UA =500 (*)    All other components within normal limits    EKG   Radiology No results found.  Procedures Procedures (including critical care time)  Medications Ordered in UC Medications  ketorolac (TORADOL) injection 60 mg (has no administration in time range)    Initial Impression / Assessment and Plan / UC Course  I have reviewed the triage vital signs and the nursing notes.  Pertinent  labs & imaging results that were available during my care of the patient were reviewed by me and considered in my medical decision making (see chart for details).     History and physical consistent with renal colic.  Given patient's poor oral hydration over the last 1 to 2 weeks, this could be inciting event.  Patient has held the Celebrex today, given Toradol in office today which she tolerated well.  Will provide Toradol prescription for the next few days, encourage increase fluid intake so long issue is without decreased urine output, and follow-up with urology.  ER return precautions were discussed: Patient verbalized understanding, agreeable to plan. Final Clinical Impressions(s) / UC Diagnoses   Final diagnoses:  Renal colic on right side  History of kidney stones     Discharge Instructions     Important to drink plenty of water: Use strainer when urinating to see if you catch stone. May take Toradol as directed for pain: Important that you hold your Celebrex take this medication as this could increase your risk of bleeding, GI upset. Go to ER for worsening pain, gross blood in urine, decreased urine output, fever.    ED Prescriptions    Medication Sig Dispense Auth. Provider   ketorolac (TORADOL) 10 MG tablet Take 1 tablet (10 mg total) by mouth every 6 (six) hours as needed. 20 tablet Hall-Potvin, [place], PA-C     PDMP not reviewed this encounter.   Neldon Mc Weston, [place] 08/07/19 [phone]

## 2019-08-16 ENCOUNTER — Other Ambulatory Visit: Payer: Self-pay

## 2019-08-16 DIAGNOSIS — Z20822 Contact with and (suspected) exposure to covid-19: Secondary | ICD-10-CM

## 2019-08-17 LAB — NOVEL CORONAVIRUS, NAA: SARS-CoV-2, NAA: NOT DETECTED

## 2019-09-08 ENCOUNTER — Other Ambulatory Visit: Payer: Self-pay | Admitting: Internal Medicine

## 2019-09-08 ENCOUNTER — Ambulatory Visit: Payer: Medicare Other | Admitting: Internal Medicine

## 2019-09-13 ENCOUNTER — Other Ambulatory Visit: Payer: Self-pay | Admitting: Internal Medicine

## 2019-09-18 ENCOUNTER — Other Ambulatory Visit: Payer: Self-pay | Admitting: Internal Medicine

## 2019-10-12 NOTE — Progress Notes (Signed)
Subjective:    Patient ID: [name], female    DOB: 1971-07-05, 49 y.o.   MRN: [id number]  HPI The patient is here for follow up of their chronic medical problems, including diabetes, hypertension, hyperlipidemia, obesity, gerd, anxiety and asthma.  She is taking all of her medications as prescribed.   She is exercising a little bit.  She has lost some weight.  She feels like she is eating well - has made small changes in her eating.      Her sugars have been good - elevated with holidays, but well controlled otherwise - less than 115 in morning.    She has had a lot of stress over the past year with her own medical problems or family issues.  Overall she feels like she is doing pretty well.  She is taking her medication (Effexor) daily as prescribed and feels it is helping.   Medications and allergies reviewed with patient and updated if appropriate.  Patient Active Problem List   Diagnosis Date Noted  . History of nephrolithiasis 10/13/2019  . Anxiety 02/05/2018  . Peroneal tendinitis of lower leg, right 10/22/2017  . Chronic respiratory failure with hypoxia ([place]) 07/20/2017  . Knee pain, left 02/02/2016  . Cough variant asthma 03/09/2015  . Diabetes type 2, uncontrolled ([place]) 12/09/2012  . Dyslipidemia   . Acute on chronic respiratory failure with hypoxia ([place]) 04/05/2012  . Allergic rhinitis 01/05/2010  . Essential hypertension 09/07/2009  . URINARY INCONTINENCE, URGE 03/24/2009  . CHRONIC RHINITIS 12/23/2008  . Morbid obesity due to excess calories ([place]) 10/10/2007  . INTERSTITIAL LUNG DISEASE 10/10/2007  . GASTROESOPHAGEAL REFLUX DISEASE 10/10/2007    Current Outpatient Medications on File Prior to Visit  Medication Sig Dispense Refill  . albuterol (PROVENTIL HFA;VENTOLIN HFA) 108 (90 Base) MCG/ACT inhaler Inhale 2 puffs into the lungs every 4 (four) hours as needed for wheezing or shortness of breath. 1 Inhaler 0  . APPLE CIDER VINEGAR PO Take 450 mg by mouth  daily.    Marland Kitchen aspirin 81 MG chewable tablet Chew 1 tablet (81 mg total) by mouth 2 (two) times daily. 84 tablet 0  . atorvastatin (LIPITOR) 20 MG tablet TAKE ONE TABLET BY MOUTH DAILY 90 tablet 1  . B COMPLEX VITAMINS SL Place 1 mL under the tongue 4 (four) times a week.    . calcium-vitamin D (OSCAL WITH D) 500-200 MG-UNIT tablet Take 1 tablet by mouth daily.    . celecoxib (CELEBREX) 100 MG capsule Take 1 capsule (100 mg total) by mouth 2 (two) times daily. 60 capsule 2  . Dextromethorphan-guaiFENesin (MUCINEX DM MAXIMUM STRENGTH) 60-1200 MG TB12 Take 1 tablet by mouth every 12 (twelve) hours as needed (with flutter valve).    Marland Kitchen ELDERBERRY PO Take 1,000 mg by mouth daily.    . fluticasone (FLONASE) 50 MCG/ACT nasal spray Place 2 sprays into both nostrils daily as needed for allergies or rhinitis.    Marland Kitchen JANUMET XR [phone] MG TB24 TAKE ONE TABLET BY MOUTH DAILY 90 tablet 1  . JARDIANCE 25 MG TABS tablet TAKE ONE TABLET BY MOUTH EVERY MORNING WITH OR WITHOUT FOOD 90 tablet 1  . ketorolac (TORADOL) 10 MG tablet Take 1 tablet (10 mg total) by mouth every 6 (six) hours as needed. 20 tablet 0  . loratadine (CLARITIN) 10 MG tablet Take 10 mg by mouth at bedtime.     Marland Kitchen losartan (COZAAR) 25 MG tablet Take 0.5 tablets (12.5 mg total) by mouth daily.  Need office visit for more refills 15 tablet 0  . mometasone-formoterol (DULERA) 100-5 MCG/ACT AERO Inhale 2 puffs into the lungs 2 (two) times daily.    . montelukast (SINGULAIR) 10 MG tablet Take 1 tablet (10 mg total) by mouth at bedtime. (Patient taking differently: Take 10 mg by mouth at bedtime as needed (alleriges.). ) 30 tablet 5  . ONETOUCH ULTRA test strip USE TO CHECK BLOOD SUGARS TWO TIMES A DAY 100 strip 2  . OXYGEN 2lpm with sleep and 3lpm with exertion    . pantoprazole (PROTONIX) 40 MG tablet TAKE ONE TABLET BY MOUTH TWICE A DAY BEFORE MEALS (Patient taking differently: Take 40 mg by mouth 2 (two) times a day. ) 60 tablet 1  . predniSONE  (DELTASONE) 5 MG tablet Take 5 mg by mouth daily.     Marland Kitchen Respiratory Therapy Supplies (FLUTTER) DEVI Use as directed 1 each 0  . TRULICITY 1.5 0000000 SOPN INJECT 1.5 MG UNDER THE SKIN ONCE WEEKLY 2 pen 5  . Turmeric 500 MG CAPS Take 500 mg by mouth daily.    Marland Kitchen venlafaxine XR (EFFEXOR-XR) 37.5 MG 24 hr capsule Take one capsule by mouth daily with breakfast. Need office visit for more refills. 30 capsule 0  . VITRON-C 65-125 MG TABS Take 1 tablet by mouth daily.     No current facility-administered medications on file prior to visit.    Past Medical History:  Diagnosis Date  . Allergic rhinitis   . Arthritis    Knees ankles  feet  . Bronchitis   . Chronic rhinitis   . Cough   . Dyslipidemia   . Dysphagia   . Dyspnea    occationally over last 2 years  . GERD (gastroesophageal reflux disease)   . Headache(784.0)   . ILD (interstitial lung disease) ([place])   . Mild depression ([place])   . Morbid obesity ([place])   . Type II or diabetes mellitus, uncontrolled 11/2012 dx   Dx 12/08/12: a1c 10.0  . Unspecified essential hypertension   . Urge incontinence     Past Surgical History:  Procedure Laterality Date  . ABDOMINAL HYSTERECTOMY    . BILATERAL VATS ABLATION  02/15/04  . CESAREAN SECTION    . KNEE ARTHROSCOPY WITH MEDIAL MENISECTOMY Right 04/01/2019   Procedure: KNEE ARTHROSCOPY WITH MEDIAL MENISECTOMY;  Surgeon: Hiram Gash, MD;  Location: [facility];  Service: Orthopedics;  Laterality: Right;  . LUNG BIOPSY    . RIGHT HEART CATH N/A 01/30/2017   Procedure: Right Heart Cath;  Surgeon: Larey Dresser, MD;  Location: [facility];  Service: Cardiovascular;  Laterality: N/A;    Social History   Socioeconomic History  . Marital status: Married    Spouse name: Not on file  . Number of children: 1  . Years of education: Not on file  . Highest education level: Not on file  Occupational History  . 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 Smoking status: Never Smoker  . Smokeless tobacco: Never Used  . Tobacco comment: {  Substance and Sexual Activity  . Alcohol use: No    Alcohol/week: 0.0 standard drinks  . Drug use: No  . Sexual activity: Never  Other Topics Concern  . Not on file  Social History Narrative  . Not on file   Social Determinants of Health   Financial Resource Strain:   . Difficulty  of Paying Living Expenses: Not on file  Food Insecurity:   . Worried About Charity fundraiser in the Last Year: Not on file  . Ran Out of Food in the Last Year: Not on file  Transportation Needs:   . Lack of Transportation (Medical): Not on file  . Lack of Transportation (Non-Medical): Not on file  Physical Activity:   . Days of Exercise per Week: Not on file  . Minutes of Exercise per Session: Not on file  Stress:   . Feeling of Stress : Not on file  Social Connections:   . Frequency of Communication with Friends and Family: Not on file  . Frequency of Social Gatherings with Friends and Family: Not on file  . Attends Religious Services: Not on file  . Active Member of Clubs or Organizations: Not on file  . Attends Archivist Meetings: Not on file  . Marital Status: Not on file    Family History  Problem Relation Age of Onset  . Sarcoidosis Mother        dxed by transbrochial bx  . Allergies Mother   . Heart disease Father   . Diabetes Father   . Allergies Father   . Coronary artery disease Father   . Cancer Maternal Grandmother        CA of unknown type  . Cancer Paternal Grandmother        CA of unknown type    Review of Systems  Constitutional: Negative for chills and fever.  Respiratory: Positive for cough (occ). Negative for shortness of breath and wheezing.   Cardiovascular: Positive for palpitations (stress on occasion). Negative for chest pain and leg swelling.  Neurological: Positive for headaches (occ, sinus). Negative for light-headedness.        Objective:   Vitals:   10/13/19 0919  BP: 124/82  Pulse: 64  Resp: 18  Temp: 99.2 F (37.3 C)  SpO2: 99%   BP Readings from Last 3 Encounters:  10/13/19 124/82  08/07/19 (!) 157/92  06/09/19 114/76   Wt Readings from Last 3 Encounters:  10/13/19 236 lb (107 kg)  06/09/19 245 lb (111.1 kg)  03/25/19 248 lb 5 oz (112.6 kg)   Body mass index is 46.09 kg/m.   Physical Exam    Constitutional: Appears well-developed and well-nourished. No distress.  HENT:  Head: Normocephalic and atraumatic.  Neck: Neck supple. No tracheal deviation present. No thyromegaly present.  No cervical lymphadenopathy Cardiovascular: Normal rate, regular rhythm and normal heart sounds.  No murmur heard. No carotid bruit .  No edema Pulmonary/Chest: Effort normal and breath sounds normal. No respiratory distress. No has no wheezes. No rales.  Skin: Skin is warm and dry. Not diaphoretic.  Psychiatric: Normal mood and affect. Behavior is normal.      Assessment & Plan:    See Problem List for Assessment and Plan of chronic medical problems.    This visit occurred during the SARS-CoV-2 public health emergency.  Safety protocols were in place, including screening questions prior to the visit, additional usage of staff PPE, and extensive cleaning of exam room while observing appropriate contact time as indicated for disinfecting solutions.

## 2019-10-12 NOTE — Patient Instructions (Addendum)
  Blood work was ordered.     Medications reviewed and updated.  Changes include :   None     Please followup in 6 months   

## 2019-10-13 ENCOUNTER — Encounter: Payer: Self-pay | Admitting: Internal Medicine

## 2019-10-13 ENCOUNTER — Ambulatory Visit (INDEPENDENT_AMBULATORY_CARE_PROVIDER_SITE_OTHER): Payer: Medicare PPO | Admitting: Internal Medicine

## 2019-10-13 ENCOUNTER — Other Ambulatory Visit: Payer: Self-pay

## 2019-10-13 VITALS — BP 124/82 | HR 64 | Temp 99.2°F | Resp 18 | Ht 60.0 in | Wt 236.0 lb

## 2019-10-13 DIAGNOSIS — E1165 Type 2 diabetes mellitus with hyperglycemia: Secondary | ICD-10-CM

## 2019-10-13 DIAGNOSIS — I10 Essential (primary) hypertension: Secondary | ICD-10-CM | POA: Diagnosis not present

## 2019-10-13 DIAGNOSIS — F419 Anxiety disorder, unspecified: Secondary | ICD-10-CM | POA: Diagnosis not present

## 2019-10-13 DIAGNOSIS — K219 Gastro-esophageal reflux disease without esophagitis: Secondary | ICD-10-CM

## 2019-10-13 DIAGNOSIS — Z87442 Personal history of urinary calculi: Secondary | ICD-10-CM | POA: Insufficient documentation

## 2019-10-13 DIAGNOSIS — E785 Hyperlipidemia, unspecified: Secondary | ICD-10-CM

## 2019-10-13 LAB — COMPREHENSIVE METABOLIC PANEL
ALT: 15 U/L (ref 0–35)
AST: 16 U/L (ref 0–37)
Albumin: 4.1 g/dL (ref 3.5–5.2)
Alkaline Phosphatase: 71 U/L (ref 39–117)
BUN: 15 mg/dL (ref 6–23)
CO2: 29 mEq/L (ref 19–32)
Calcium: 9.8 mg/dL (ref 8.4–10.5)
Chloride: 101 mEq/L (ref 96–112)
Creatinine, Ser: 0.61 mg/dL (ref 0.40–1.20)
GFR: 126.92 mL/min (ref >60.00)
Glucose, Bld: 132 mg/dL — ABNORMAL HIGH (ref 70–99)
Potassium: 4 mEq/L (ref 3.5–5.1)
Sodium: 136 mEq/L (ref 135–145)
Total Bilirubin: 0.3 mg/dL (ref 0.2–1.2)
Total Protein: 7.7 g/dL (ref 6.0–8.3)

## 2019-10-13 LAB — LIPID PANEL
Cholesterol: 165 mg/dL (ref 0–200)
HDL: 54.6 mg/dL (ref >39.00)
LDL Cholesterol: 99 mg/dL (ref 0–99)
NonHDL: 110.31
Total CHOL/HDL Ratio: 3
Triglycerides: 55 mg/dL (ref 0.0–149.0)
VLDL: 11 mg/dL (ref 0.0–40.0)

## 2019-10-13 LAB — HEMOGLOBIN A1C: Hgb A1c MFr Bld: 7.3 % — ABNORMAL HIGH (ref 4.6–6.5)

## 2019-10-13 NOTE — Assessment & Plan Note (Signed)
Chronic Controlled, stable Continue current dose of medication effexor 37.5 mg daily 

## 2019-10-13 NOTE — Assessment & Plan Note (Signed)
Chronic Check lipid panel  Continue daily statin Regular exercise and healthy diet encouraged  

## 2019-10-13 NOTE — Assessment & Plan Note (Signed)
With diabetes, hypertension and hyperlipidemia She has been working on weight loss and has successfully lost weight She has made positive changes in her diet and will continue those She is exercising some-encourage more regular exercise Continue weight loss efforts Follow-up in 6 months

## 2019-10-13 NOTE — Assessment & Plan Note (Signed)
Chronic GERD controlled Continue daily medication  

## 2019-10-13 NOTE — Assessment & Plan Note (Addendum)
Chronic Sugars controlled at home Check a1c Low sugar / carb diet Stressed regular exercise Encouraged continuing weight loss efforts

## 2019-10-13 NOTE — Assessment & Plan Note (Signed)
Chronic BP well controlled Current regimen effective and well tolerated Continue current medications at current doses cmp  

## 2019-10-14 ENCOUNTER — Encounter: Payer: Self-pay | Admitting: Internal Medicine

## 2019-10-14 MED ORDER — TRULICITY 3 MG/0.5ML ~~LOC~~ SOAJ: 3.0000 mg | SUBCUTANEOUS | 5 refills | Status: DC

## 2019-10-24 ENCOUNTER — Ambulatory Visit: Payer: Medicare PPO | Attending: Internal Medicine

## 2019-10-24 DIAGNOSIS — Z23 Encounter for immunization: Secondary | ICD-10-CM | POA: Insufficient documentation

## 2019-10-27 ENCOUNTER — Other Ambulatory Visit: Payer: Self-pay | Admitting: Internal Medicine

## 2019-10-27 DIAGNOSIS — R091 Pleurisy: Secondary | ICD-10-CM | POA: Diagnosis not present

## 2019-10-27 DIAGNOSIS — M722 Plantar fascial fibromatosis: Secondary | ICD-10-CM | POA: Diagnosis not present

## 2019-10-27 DIAGNOSIS — E661 Drug-induced obesity: Secondary | ICD-10-CM | POA: Diagnosis not present

## 2019-11-13 ENCOUNTER — Other Ambulatory Visit: Payer: Self-pay | Admitting: Internal Medicine

## 2019-11-21 ENCOUNTER — Ambulatory Visit: Payer: Medicare PPO

## 2019-11-24 DIAGNOSIS — M25562 Pain in left knee: Secondary | ICD-10-CM | POA: Diagnosis not present

## 2019-11-27 DIAGNOSIS — E661 Drug-induced obesity: Secondary | ICD-10-CM | POA: Diagnosis not present

## 2019-11-27 DIAGNOSIS — M722 Plantar fascial fibromatosis: Secondary | ICD-10-CM | POA: Diagnosis not present

## 2019-11-27 DIAGNOSIS — R091 Pleurisy: Secondary | ICD-10-CM | POA: Diagnosis not present

## 2019-11-28 ENCOUNTER — Ambulatory Visit: Payer: Medicare PPO | Attending: Internal Medicine

## 2019-11-28 DIAGNOSIS — Z23 Encounter for immunization: Secondary | ICD-10-CM

## 2019-11-28 NOTE — Progress Notes (Signed)
   Covid-19 Vaccination Clinic  Name:  [name]    MRN: [id number] DOB: 1971-01-18  11/28/2019  [name] was observed post Covid-19 immunization for 15 minutes without incidence. She was provided with Vaccine Information Sheet and instruction to access the V-Safe system.   [name] was instructed to call 911 with any severe reactions post vaccine: Marland Kitchen Difficulty breathing  . Swelling of your face and throat  . A fast heartbeat  . A bad rash all over your body  . Dizziness and weakness    Immunizations Administered    Name Date Dose VIS Date Route   Moderna COVID-19 Vaccine 11/28/2019 10:14 AM 0.5 mL 09/01/2019 Intramuscular   Manufacturer: Moderna   Lot: [id number]   [id number]

## 2019-11-30 DIAGNOSIS — M25562 Pain in left knee: Secondary | ICD-10-CM | POA: Diagnosis not present

## 2019-12-04 DIAGNOSIS — M25562 Pain in left knee: Secondary | ICD-10-CM | POA: Diagnosis not present

## 2019-12-08 ENCOUNTER — Other Ambulatory Visit: Payer: Self-pay

## 2019-12-08 ENCOUNTER — Ambulatory Visit: Payer: Medicare PPO | Admitting: Internal Medicine

## 2019-12-08 ENCOUNTER — Encounter: Payer: Self-pay | Admitting: Internal Medicine

## 2019-12-08 DIAGNOSIS — J9611 Chronic respiratory failure with hypoxia: Secondary | ICD-10-CM | POA: Diagnosis not present

## 2019-12-08 DIAGNOSIS — J45991 Cough variant asthma: Secondary | ICD-10-CM

## 2019-12-08 DIAGNOSIS — J8410 Pulmonary fibrosis, unspecified: Secondary | ICD-10-CM

## 2019-12-08 MED ORDER — PREDNISONE 5 MG PO TABS: 5.0000 mg | ORAL_TABLET | Freq: Every day | ORAL | 1 refills | Status: DC

## 2019-12-08 NOTE — Patient Instructions (Signed)
Call if you want something stronger for your nasal symptoms = dymista one twice (instead of flonase)  You are cleared for knee surgery   Make sure you check your oxygen saturations at highest level of activity to be sure it stays over 90% and adjust upward to maintain this level if needed but remember to turn it back to previous settings when you stop (to conserve your supply).   Please schedule a follow up visit in 6 months but call sooner if needed

## 2019-12-08 NOTE — Progress Notes (Signed)
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  Since then waxing/waning sx requiring intermittent prednisone tapers with only minimal improvement - most of her symptoms chronically have been related to obesity with dyspnea on exertion and tendency to chronic cough which is felt to be at least partly  reflux related but improved with saba 03/09/2015 so started on dulera 100 2bid in addition to maint pred and seems to have helped but not typically  able to get < 10 mg pred s flare cough/sob     History of Present Illness  November 07, 2010 ov cc cough/ strangling  a bit more with taper off reglan (actually worse before ran out of the low dose reglan)  no excess mucus or increase sob on prednisone @  10 mg daily .  Using med calendar well rec 1)  Ok to resume water aerobics but pace yourself 2)  Wait until your swallowing evaulation is complete before making a decision re reglan > stopped it on her own.  3)  Try Prednisone 10 mg one-half each am   04/04/2012 f/u ov/Shamira Toutant no longer using med calendar on Pred 10 mg one daily  added hyzaar to rx for hbp/leg swelling not back to mall walking yet,  No unusual cough, purulent sputum or sinus/hb symptoms on present rx. No variability to symptoms or need for inhaler. Rec Try prednisone 10 mg one half daily to see if affects your ability to walk the mall on 02 or the cough gets worse (walk the mall first for at least before you try) Always walk for exercise on 3lpm - this will help you burn fat and get into negative calorie balance to lose weight.   03/17/2013 f/u ov/Mady Oubre re NSIP on floor of 10 mg per day Chief Complaint  Patient presents with  . Follow-up    Breathing is overall doing well and she denies any new co's today.   ex on a treadmill x 34m x 3 x daily on 2lpm not the 3lpm as rec >>Prednisone  ceiling is 20 mg per day and floor is 10 mg per day       02/06/2016  f/u ov/Itha Kroeker re: NSIP / 3lpm with ex/ duler 100 2bid and pred 10 mg daily  Chief Complaint  Patient presents with  . Follow-up    Pt states that she did have some issues with allergies but is improving. Pt also was having issues with Dulera, jittery after use, but has started rinsing her mouth after use and side effects have improved. Pt denies cough/wheeze/SOB/CP/tightness.   limited by L knee from doing treadmill, has trainer but not losing wt  Rhinitis symptoms better on singulair   rec Work on inhaler technique:    Should be able to tolerate dulera 100 Take 2 puffs first thing in am and then another 2 puffs about 12 hours later.  If so, try prednisone 10 - 5  = even days take a half, odd days take half   06/01/2016  f/u ov/Jashan Cotten re: NSIP  Chief Complaint  Patient presents with  . Follow-up    Breathing is doing well. She denies any new co's today.   when ex 3lpm and back on treadmill x 25 min more limited by knee 2-3x per week at  48mph flat / minimal dry daytime cough on nexium 20 mg bid ac  rec Work on inhaler technique:   Continue  dulera 100 Take 2 puffs first thing in am and then another 2 puffs about 12 hours later.  try prednisone 10 - 5  = even days take a half, odd days take half   08/31/2016  f/u ov/Takeria Marquina re:  NSIP ? Cough variant asthma component maint on dulera 100 2bid  Chief Complaint  Patient presents with  . Follow-up    Increased cough x 2 wks  on treadmill x 3lpm x 25 min  Decreased pred Nov 13th and cough worse starting on 23rd  Cough worse at hs and early in am but non-productive  Nose feels dried out/ clogged up/ saline helps  rec Stay on singulair each evening Try dulera 200 Take 2 puffs first thing in am and then another 2 puffs about 12 hours later to see if helps or not by the time you return  Protonix 40 mg Take 30- 60 min before your first and last meals of the day  Prednisone 10 mg  daily until better then 10 alternating with 5 mg daily     12/04/2018  f/u ov/Horacio Werth re: NSIP maint on pred 5 mg daily  Chief Complaint  Patient presents with  . Follow-up    PFT done today. Breathing is unchanged. She has not had to use her rescue inhaler.   Dyspnea:  Able to do church steps s any 02  which is an improvement  Cough reproducible always with deep breath/ dry  Sleeping: bed flat/ propped up to 30 degrees with pillows SABA use: none 02: 2lpm at hs /  3lpm ex and sats 98% per pt  rec No change    Knee surgery 04/01/2019 Right knee arthroscopic partial medial meniscectomy and medial femoral and trochlear chondroplasty     06/09/2019  f/u ov/Stephie Xu re: NSIP/ cough variant asthma on pred 5 mg daily and dulera 10 1 each am  Chief Complaint  Patient presents with  . Follow-up    Breathing is overall doing well. She has occ nasal congestion and cough in the am- relates to allergies. She rarely uses her albuterol.   Dyspnea:  Walking outdoor track total of two laps on 3lpm sats with sats 93-95% knee slows her down 1st  Cough: minimal / dry  Sleeping: 45 degrees with pillows / 2lpm   SABA use: rarely  02: as above rec No change rx   12/08/2019  f/u ov/Tramain Gershman re: NSIP/ cough variant asthma on duler 100 one twice daily / pred 5 / needs clearance for knee scope Second moderna 11/28/19   Chief Complaint  Patient presents with  . Follow-up    Breathing is overall doing well. She has minimal dry cough, sneezing and runny nose- relates to allergies.   Dyspnea:  More limited by L knee torn miniscus for surgery varkey Cough: minimal assoc with pnds/ better on clariton/worse in am Sleeping: 45 degrees  SABA use: proair one day prior but rarely needing  02: 2lpm and not needing daytime   No obvious day to day or daytime variability or assoc excess/ purulent sputum or mucus plugs or hemoptysis or cp or chest tightness, subjective wheeze or overt sinus or hb symptoms.   98 without  nocturnal  or early am exacerbation  of respiratory  c/o's or need for noct saba. Also denies any obvious fluctuation of symptoms with weather or environmental changes or  other aggravating or alleviating factors except as outlined above   No unusual exposure hx or h/o childhood pna/ asthma or knowledge of premature birth.  Current Allergies, Complete Past Medical History, Past Surgical History, Family History, and Social History were reviewed in [employer] record.  ROS  The following are not active complaints unless bolded Hoarseness, sore throat, dysphagia, dental problems, itching, sneezing,  nasal congestion or discharge of excess mucus or purulent secretions, ear ache,   fever, chills, sweats, unintended wt loss or wt gain, classically pleuritic or exertional cp,  orthopnea pnd or arm/hand swelling  or leg swelling, presyncope, palpitations, abdominal pain, anorexia, nausea, vomiting, diarrhea  or change in bowel habits or change in bladder habits, change in stools or change in urine, dysuria, hematuria,  rash, arthralgias, visual complaints, headache, numbness, weakness or ataxia or problems with walking or coordination,  change in mood or  memory.        Current Meds  Medication Sig  . albuterol (PROVENTIL HFA;VENTOLIN HFA) 108 (90 Base) MCG/ACT inhaler Inhale 2 puffs into the lungs every 4 (four) hours as needed for wheezing or shortness of breath.  . APPLE CIDER VINEGAR PO Take 450 mg by mouth daily.  Marland Kitchen aspirin 81 MG chewable tablet Chew 1 tablet (81 mg total) by mouth 2 (two) times daily.  Marland Kitchen atorvastatin (LIPITOR) 20 MG tablet TAKE ONE TABLET BY MOUTH DAILY  . B COMPLEX VITAMINS SL Place 1 mL under the tongue 4 (four) times a week.  . calcium-vitamin D (OSCAL WITH D) 500-200 MG-UNIT tablet Take 1 tablet by mouth daily.  . celecoxib (CELEBREX) 100 MG capsule Take 1 capsule (100 mg total) by mouth 2 (two) times daily.  Marland Kitchen Dextromethorphan-guaiFENesin (MUCINEX DM  MAXIMUM STRENGTH) 60-1200 MG TB12 Take 1 tablet by mouth every 12 (twelve) hours as needed (with flutter valve).  . Dulaglutide (TRULICITY) 3 0000000 SOPN Inject 3 mg into the skin once a week.  Marland Kitchen ELDERBERRY PO Take 1,000 mg by mouth daily.  . fluticasone (FLONASE) 50 MCG/ACT nasal spray Place 2 sprays into both nostrils daily as needed for allergies or rhinitis.  Marland Kitchen JANUMET XR [phone] MG TB24 TAKE ONE TABLET BY MOUTH DAILY  . JARDIANCE 25 MG TABS tablet TAKE ONE TABLET BY MOUTH EVERY MORNING WITH OR WITHOUT FOOD  . ketorolac (TORADOL) 10 MG tablet Take 1 tablet (10 mg total) by mouth every 6 (six) hours as needed.  . loratadine (CLARITIN) 10 MG tablet Take 10 mg by mouth at bedtime.   Marland Kitchen losartan (COZAAR) 25 MG tablet Take 0.5 tablets (12.5 mg total) by mouth daily. Need office visit for more refills  . mometasone-formoterol (DULERA) 100-5 MCG/ACT AERO Inhale 1 puff into the lungs 2 (two) times daily.   . montelukast (SINGULAIR) 10 MG tablet Take 1 tablet (10 mg total) by mouth at bedtime. (Patient taking differently: Take 10 mg by mouth at bedtime as needed (alleriges.). )  . ONETOUCH ULTRA test strip USE TO CHECK BLOOD SUGARS TWO TIMES A DAY  . OXYGEN 2lpm with sleep and 3lpm with exertion  . pantoprazole (PROTONIX) 40 MG tablet TAKE ONE TABLET BY MOUTH TWICE A DAY BEFORE MEALS  . predniSONE (DELTASONE) 5 MG tablet Take 5 mg by mouth daily.   Marland Kitchen Respiratory Therapy Supplies (FLUTTER) DEVI Use as directed  . Turmeric 500 MG CAPS Take 500 mg by mouth daily.  Marland Kitchen venlafaxine XR (EFFEXOR-XR) 37.5 MG 24 hr capsule TAKE ONE CAPSULE BY MOUTH DAILY WITH BREAKFAST *  NEED OFFICE VISIT FOR MORE REFILLS*  . VITRON-C 65-125 MG TABS Take 1 tablet by mouth daily.             Past Medical History: GASTROESOPHAGEAL REFLUX DISEASE     - Tapered reglan to 10 mg one half twice daily June 22, 2010 > d/c'd 11/2010  -restarted reglan 03/2011 >>unable to tolerate.  INTERSTITIAL LUNG  DISEASE...........................................Marland KitchenWert   -VATS 02/15/2004 NSIP ([place] reviewed/confirmed)   -Restarted Prednisone 02/24/08   -Desats with > 2 laps 06/22/10 so rec wear 02 2lpm at bedtime and with ex  MORBID OBESITY   - Target wt  =  153  for BMI < 30  Hypertension Health Maintenance...........................................................  Burnsw    - Pneumovax July 22, 2009             Objective:   Physical Exam   amb obese bf nad  12/08/2019 238  06/09/2019 245  wt  305 May 28, 2008 >   302 November 07, 2010 >   303 03/22/2011 > 10/25/2011  299 > 311 04/04/2012 > 306 03/17/2013 >06/15/2013 303 > 10/06/2013 298  > 02/08/2014 289 > 10/06/2014   288 > 03/09/2015 283 > 10/21/2015    283 >  02/06/2016  284 > 06/01/2016  282 > 08/31/2016   281 > 07/12/2017   270 > 11/26/2017   263> 06/30/2018   252 > 12/04/2018  244    Vital signs reviewed  12/08/2019  - Note at rest 02 sats  98% on RA       HEENT : pt wearing mask not removed for exam due to covid -19 concerns.    NECK :  without JVD/Nodes/TM/ nl carotid upstrokes bilaterally   LUNGS: no acc muscle use,  Nl contour chest which is clear to A and P bilaterally without cough on insp or exp maneuvers   CV:  RRR  no s3 or murmur or increase in P2, and no edema   ABD:  Obese soft and nontender with nl inspiratory excursion in the supine position. No bruits or organomegaly appreciated, bowel sounds nl  MS:  Nl gait/ ext warm without deformities, calf tenderness, cyanosis or clubbing No obvious joint restrictions   SKIN: warm and dry without lesions    NEURO:  alert, approp, nl sensorium with  no motor or cerebellar deficits apparent.             Assessment & Plan:

## 2019-12-09 ENCOUNTER — Encounter: Payer: Self-pay | Admitting: Internal Medicine

## 2019-12-09 NOTE — Assessment & Plan Note (Signed)
On 02 since 2011  As of 12/08/2019  rx = 2lpm hs and prn with activity up to 3lpm   Adequate control on present rx, reviewed in detail with pt > no change in rx needed           Each maintenance medication was reviewed in detail including emphasizing most importantly the difference between maintenance and prns and under what circumstances the prns are to be triggered using an action plan format where appropriate.  Total time for H and P, chart review, counseling, reviewing mdi device use  and generating customized AVS unique to this office visit / charting = 30 min

## 2019-12-09 NOTE — Assessment & Plan Note (Signed)
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)   -Restarted Prednisone 02/24/08   -PFT's December 23, 2008 VC 35%  DLC0 31%   -PFT's 02/08/2014   VC 1.30 (49%) and DLCO is 70%  > rec pred 10/5 > did not tolerate - PFTs  03/09/2015     VC 1.27 (48%) and dlco is 72% @ 10 mg daily  - 02/06/2016 try 10 a/w 5 mg daily > did not try - 06/01/2016 try 10 a/w 5 daily > cough  flared p 1 week on lower dose  -ANA positive 10/2016 >refer to rheumatology  -HRCT chest essentially stable changes since 2005 10/09/2016 >  Rheum eval 12/03/16 [facility]  And f/u q 3 mo as of 06/30/2018   - PFTs 12/04/2018  VC = 1.38 (54%)  And  dlco 13.72 =73% with corrects to 6.5 144 % on pred 5 mg daily = no significant change from prior  The goal with a chronic steroid dependent illness is always arriving at the lowest effective dose that controls the disease/symptoms and not accepting a set "formula" which is based on statistics or guidelines that don't always take into account patient  variability or the natural hx of the dz in every individual patient, which may well vary over time.  For now therefore I recommend the patient maintain  5 mg per day thru spring / knee surgery then ok to try 5 - 2.5 - 5 mg daily

## 2019-12-09 NOTE — Assessment & Plan Note (Signed)
Dx 2016 / clinical  - PFTs.  03/09/2015  14% better p B2 and less cough p saba - 03/09/2015   > try dulera 100 2bid  - 08/31/2016   try increase to 200 2bid due to cough > no better so back to 100 2bid maint  -  11/26/2017  After extensive coaching inhaler device  effectiveness =  75% ( short Ti )    Reminded with any resp flare or need for saba > increase dulera 100 to 2bid x one week then taper back Based on two studies from NEJM  378; 20 p 1865 (2018) and 380 : p2020-30 (2019) in pts with mild asthma it is reasonable to use low dose symbicort (or dulera 100 by inference as also has formoterol and pred)  eg 80 2bid "prn" flare in this setting but I emphasized this was only shown with symbicort and takes advantage of the rapid onset of action but is not the same as "rescue therapy" but can be stopped once the acute symptoms have resolved and the need for rescue has been minimized (< 2 x weekly)

## 2019-12-13 ENCOUNTER — Other Ambulatory Visit: Payer: Self-pay | Admitting: Internal Medicine

## 2019-12-14 ENCOUNTER — Telehealth: Payer: Self-pay

## 2019-12-14 NOTE — Telephone Encounter (Deleted)
Error

## 2019-12-20 NOTE — Progress Notes (Signed)
Subjective:    Patient ID: [name], female    DOB: Sep 22, 1971, 49 y.o.   MRN: [id number]  HPI     Medications and allergies reviewed with patient and updated if appropriate.  Patient Active Problem List   Diagnosis Date Noted  . History of nephrolithiasis 10/13/2019  . Anxiety 02/05/2018  . Peroneal tendinitis of lower leg, right 10/22/2017  . Chronic respiratory failure with hypoxia ([place]) 07/20/2017  . Knee pain, left 02/02/2016  . Cough variant asthma 03/09/2015  . Diabetes type 2, uncontrolled ([place]) 12/09/2012  . Dyslipidemia   . Acute on chronic respiratory failure with hypoxia ([place]) 04/05/2012  . Allergic rhinitis 01/05/2010  . Essential hypertension 09/07/2009  . URINARY INCONTINENCE, URGE 03/24/2009  . CHRONIC RHINITIS 12/23/2008  . Morbid obesity due to excess calories ([place]) 10/10/2007  . INTERSTITIAL LUNG DISEASE 10/10/2007  . GASTROESOPHAGEAL REFLUX DISEASE 10/10/2007    Current Outpatient Medications on File Prior to Visit  Medication Sig Dispense Refill  . albuterol (PROVENTIL HFA;VENTOLIN HFA) 108 (90 Base) MCG/ACT inhaler Inhale 2 puffs into the lungs every 4 (four) hours as needed for wheezing or shortness of breath. 1 Inhaler 0  . APPLE CIDER VINEGAR PO Take 450 mg by mouth daily.    Marland Kitchen aspirin 81 MG chewable tablet Chew 1 tablet (81 mg total) by mouth 2 (two) times daily. 84 tablet 0  . atorvastatin (LIPITOR) 20 MG tablet TAKE ONE TABLET BY MOUTH DAILY 90 tablet 1  . B COMPLEX VITAMINS SL Place 1 mL under the tongue 4 (four) times a week.    . calcium-vitamin D (OSCAL WITH D) 500-200 MG-UNIT tablet Take 1 tablet by mouth daily.    . celecoxib (CELEBREX) 100 MG capsule Take 1 capsule (100 mg total) by mouth 2 (two) times daily. 60 capsule 2  . Dextromethorphan-guaiFENesin (MUCINEX DM MAXIMUM STRENGTH) 60-1200 MG TB12 Take 1 tablet by mouth every 12 (twelve) hours as needed (with flutter valve).    . Dulaglutide (TRULICITY) 3 0000000 SOPN Inject 3 mg  into the skin once a week. 4 pen 5  . ELDERBERRY PO Take 1,000 mg by mouth daily.    . fluticasone (FLONASE) 50 MCG/ACT nasal spray Place 2 sprays into both nostrils daily as needed for allergies or rhinitis.    Marland Kitchen JANUMET XR [phone] MG TB24 TAKE ONE TABLET BY MOUTH DAILY 90 tablet 1  . JARDIANCE 25 MG TABS tablet TAKE ONE TABLET BY MOUTH EVERY MORNING WITH OR WITHOUT FOOD 90 tablet 1  . ketorolac (TORADOL) 10 MG tablet Take 1 tablet (10 mg total) by mouth every 6 (six) hours as needed. 20 tablet 0  . loratadine (CLARITIN) 10 MG tablet Take 10 mg by mouth at bedtime.     Marland Kitchen losartan (COZAAR) 25 MG tablet Take 1/2 tablet daily. 45 tablet 1  . mometasone-formoterol (DULERA) 100-5 MCG/ACT AERO Inhale 1 puff into the lungs 2 (two) times daily.     . montelukast (SINGULAIR) 10 MG tablet Take 1 tablet (10 mg total) by mouth at bedtime. (Patient taking differently: Take 10 mg by mouth at bedtime as needed (alleriges.). ) 30 tablet 5  . ONETOUCH ULTRA test strip USE TO CHECK BLOOD SUGARS TWO TIMES A DAY 100 strip 2  . OXYGEN 2lpm with sleep and 3lpm with exertion    . pantoprazole (PROTONIX) 40 MG tablet TAKE ONE TABLET BY MOUTH TWICE A DAY BEFORE MEALS 120 tablet 1  . predniSONE (DELTASONE) 5 MG tablet Take 1  tablet (5 mg total) by mouth daily. 100 tablet 1  . Respiratory Therapy Supplies (FLUTTER) DEVI Use as directed 1 each 0  . Turmeric 500 MG CAPS Take 500 mg by mouth daily.    Marland Kitchen venlafaxine XR (EFFEXOR-XR) 37.5 MG 24 hr capsule TAKE ONE CAPSULE BY MOUTH DAILY WITH BREAKFAST *NEED OFFICE VISIT FOR MORE REFILLS* 90 capsule 1  . VITRON-C 65-125 MG TABS Take 1 tablet by mouth daily.     No current facility-administered medications on file prior to visit.    Past Medical History:  Diagnosis Date  . Allergic rhinitis   . Arthritis    Knees ankles  feet  . Bronchitis   . Chronic rhinitis   . Cough   . Dyslipidemia   . Dysphagia   . Dyspnea    occationally over last 2 years  . GERD  (gastroesophageal reflux disease)   . Headache(784.0)   . ILD (interstitial lung disease) ([place])   . Mild depression ([place])   . Morbid obesity ([place])   . Type II or diabetes mellitus, uncontrolled 11/2012 dx   Dx 12/08/12: a1c 10.0  . Unspecified essential hypertension   . Urge incontinence     Past Surgical History:  Procedure Laterality Date  . ABDOMINAL HYSTERECTOMY    . BILATERAL VATS ABLATION  02/15/04  . CESAREAN SECTION    . KNEE ARTHROSCOPY WITH MEDIAL MENISECTOMY Right 04/01/2019   Procedure: KNEE ARTHROSCOPY WITH MEDIAL MENISECTOMY;  Surgeon: Hiram Gash, MD;  Location: [facility];  Service: Orthopedics;  Laterality: Right;  . LUNG BIOPSY    . RIGHT HEART CATH N/A 01/30/2017   Procedure: Right Heart Cath;  Surgeon: Larey Dresser, MD;  Location: [facility];  Service: Cardiovascular;  Laterality: N/A;    Social History   Socioeconomic History  . Marital status: Married    Spouse name: Not on file  . Number of children: 1  . Years of education: Not on file  . Highest education level: Not on file  Occupational History  . 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 Smoking status: Never Smoker  . Smokeless tobacco: Never Used  . Tobacco comment: {  Substance and Sexual Activity  . Alcohol use: No    Alcohol/week: 0.0 standard drinks  . Drug use: No  . Sexual activity: Never  Other Topics Concern  . Not on file  Social History Narrative  . Not on file   Social Determinants of Health   Financial Resource Strain:   . Difficulty of Paying Living Expenses:   Food Insecurity:   . Worried About Charity fundraiser in the Last Year:   . Arboriculturist in the Last Year:   Transportation Needs:   . Film/video editor (Medical):   Marland Kitchen Lack of Transportation (Non-Medical):   Physical Activity:   . Days of Exercise per Week:   . Minutes of Exercise per Session:   Stress:   . Feeling of Stress :     Social Connections:   . Frequency of Communication with Friends and Family:   . Frequency of Social Gatherings with Friends and Family:   . Attends Religious Services:   . Active Member of Clubs or Organizations:   . Attends Archivist Meetings:   Marland Kitchen Marital Status:     Family History  Problem Relation Age of Onset  . Sarcoidosis  Mother        dxed by transbrochial bx  . Allergies Mother   . Heart disease Father   . Diabetes Father   . Allergies Father   . Coronary artery disease Father   . Cancer Maternal Grandmother        CA of unknown type  . Cancer Paternal Grandmother        CA of unknown type    Review of Systems     Objective:  There were no vitals filed for this visit. There were no vitals filed for this visit. There is no height or weight on file to calculate BMI.  BP Readings from Last 3 Encounters:  12/08/19 118/82  10/13/19 124/82  08/07/19 (!) 157/92    Wt Readings from Last 3 Encounters:  12/08/19 238 lb (108 kg)  10/13/19 236 lb (107 kg)  06/09/19 245 lb (111.1 kg)     Physical Exam       Assessment & Plan:     See Problem List for Assessment and Plan of chronic medical problems.       This encounter was created in error - please disregard.

## 2019-12-21 ENCOUNTER — Encounter: Payer: Medicare PPO | Admitting: Internal Medicine

## 2019-12-25 DIAGNOSIS — E661 Drug-induced obesity: Secondary | ICD-10-CM | POA: Diagnosis not present

## 2019-12-25 DIAGNOSIS — M722 Plantar fascial fibromatosis: Secondary | ICD-10-CM | POA: Diagnosis not present

## 2019-12-25 DIAGNOSIS — R091 Pleurisy: Secondary | ICD-10-CM | POA: Diagnosis not present

## 2019-12-28 NOTE — Patient Instructions (Addendum)
DUE TO COVID-19 ONLY ONE VISITOR IS ALLOWED TO COME WITH YOU AND STAY IN THE WAITING ROOM ONLY DURING PRE OP AND PROCEDURE DAY OF SURGERY. THE 1 VISITOR MAY VISIT WITH YOU AFTER SURGERY IN YOUR PRIVATE ROOM DURING VISITING HOURS ONLY!  YOU NEED TO HAVE A COVID 19 TEST ON_4-3-21 @ 12:25 PM, THIS TEST MUST BE DONE BEFORE SURGERY, COME  [address], [address] , [address].  ([place]) ONCE YOUR COVID TEST IS COMPLETED, PLEASE BEGIN THE QUARANTINE INSTRUCTIONS AS OUTLINED IN YOUR HANDOUT.                Ninetta Croslin [place]  12/28/2019   Your procedure is scheduled on: 01-06-20    Report to [facility] Main  Entrance    Report to Admitting at 6:30 AM     Call this number if you have problems the morning of surgery [phone]    Remember: NO SOLID FOOD AFTER MIDNIGHT THE NIGHT PRIOR TO SURGERY. NOTHING BY MOUTH EXCEPT CLEAR LIQUIDS UNTIL 5:30 AM . PLEASE FINISH G2 DRINK PER SURGEON ORDER  WHICH NEEDS TO BE COMPLETED AT  5:30 AM.   CLEAR LIQUID DIET   Foods Allowed                                                                     Foods Excluded  Coffee and tea, regular and decaf                             liquids that you cannot  Plain Jell-O any favor except red or purple                                           see through such as: Fruit ices (not with fruit pulp)                                     milk, soups, orange juice  Iced Popsicles                                    All solid food Carbonated beverages, diet                                    Cranberry, grape and apple juices Sports drinks like Gatorade Lightly seasoned clear broth or consume(fat free) Sugar, honey syrup   _____________________________________________________________________     Take these medicines the morning of surgery with A SIP OF WATER: Pantoprazole (Protonix), Atoravastin and Venlafaxine XR. You may also use and bring your inhaler.    BRUSH YOUR TEETH MORNING OF  SURGERY AND RINSE YOUR MOUTH OUT, NO CHEWING GUM CANDY OR MINTS.   DO NOT TAKE ANY DIABETIC MEDICATIONS DAY OF YOUR SURGERY  You may not have any metal on your body including hair pins and              piercings    Do not wear jewelry, make-up, lotions, powders or perfumes, deodorant             Do not wear nail polish on your fingernails.  Do not shave  48 hours prior to surgery.           Do not bring valuables to the hospital. [place].  Contacts, dentures or bridgework may not be worn into surgery.      Patients discharged the day of surgery will not be allowed to drive home. IF YOU ARE HAVING SURGERY AND GOING HOME THE SAME DAY, YOU MUST HAVE AN ADULT TO DRIVE YOU HOME AND BE WITH YOU FOR 24 HOURS. YOU MAY GO HOME BY TAXI OR UBER OR ORTHERWISE, BUT AN ADULT MUST ACCOMPANY YOU HOME AND STAY WITH YOU FOR 24 HOURS.  Name and phone number of your driver:Freda Kenton [place]isher [phone]  Special Instructions: N/A              Please read over the following fact sheets you were given: _____________________________________________________________________  How to Manage Your Diabetes Before and After Surgery  Why is it important to control my blood sugar before and after surgery? . Improving blood sugar levels before and after surgery helps healing and can limit problems. . A way of improving blood sugar control is eating a healthy diet by: o  Eating less sugar and carbohydrates o  Increasing activity/exercise o  Talking with your doctor about reaching your blood sugar goals . High blood sugars (greater than 180 mg/dL) can raise your risk of infections and slow your recovery, so you will need to focus on controlling your diabetes during the weeks before surgery. . Make sure that the doctor who takes care of your diabetes knows about your planned surgery including the date and location.  How do I manage my blood  sugar before surgery? . Check your blood sugar at least 4 times a day, starting 2 days before surgery, to make sure that the level is not too high or low. o Check your blood sugar the morning of your surgery when you wake up and every 2 hours until you get to the Short Stay unit. . If your blood sugar is less than 70 mg/dL, you will need to treat for low blood sugar: o Do not take insulin. o Treat a low blood sugar (less than 70 mg/dL) with  cup of clear juice (cranberry or apple), 4 glucose tablets, OR glucose gel. o Recheck blood sugar in 15 minutes after treatment (to make sure it is greater than 70 mg/dL). If your blood sugar is not greater than 70 mg/dL on recheck, call [phone] for further instructions. . Report your blood sugar to the short stay nurse when you get to Short Stay.  . If you are admitted to the hospital after surgery: o Your blood sugar will be checked by the staff and you will probably be given insulin after surgery (instead of oral diabetes medicines) to make sure you have good blood sugar levels. o The goal for blood sugar control after surgery is 80-180 mg/dL.   WHAT DO I DO ABOUT MY DIABETES MEDICATION?  Marland Kitchen Do not take oral diabetes medicines (pills) the morning of  surgery.  . THE DAY BEFORE SURGERY, donot take your Jardiance.. However, you can take your usual  prescribed Janumet.          Reviewed and Endorsed by [facility] Patient Education Committee, August 2015           [facility] - Preparing for Surgery Before surgery, you can play an important role.  Because skin is not sterile, your skin needs to be as free of germs as possible.  You can reduce the number of germs on your skin by washing with CHG (chlorahexidine gluconate) soap before surgery.  CHG is an antiseptic cleaner which kills germs and bonds with the skin to continue killing germs even after washing. Please DO NOT use if you have an allergy to CHG or antibacterial soaps.  If your skin  becomes reddened/irritated stop using the CHG and inform your nurse when you arrive at Short Stay. Do not shave (including legs and underarms) for at least 48 hours prior to the first CHG shower.  You may shave your face/neck. Please follow these instructions carefully:  1.  Shower with CHG Soap the night before surgery and the  morning of Surgery.  2.  If you choose to wash your hair, wash your hair first as usual with your  normal  shampoo.  3.  After you shampoo, rinse your hair and body thoroughly to remove the  shampoo.                           4.  Use CHG as you would any other liquid soap.  You can apply chg directly  to the skin and wash                       Gently with a scrungie or clean washcloth.  5.  Apply the CHG Soap to your body ONLY FROM THE NECK DOWN.   Do not use on face/ open                           Wound or open sores. Avoid contact with eyes, ears mouth and genitals (private parts).                       Wash face,  Genitals (private parts) with your normal soap.             6.  Wash thoroughly, paying special attention to the area where your surgery  will be performed.  7.  Thoroughly rinse your body with warm water from the neck down.  8.  DO NOT shower/wash with your normal soap after using and rinsing off  the CHG Soap.                9.  Pat yourself dry with a clean towel.            10.  Wear clean pajamas.            11.  Place clean sheets on your bed the night of your first shower and do not  sleep with pets. Day of Surgery : Do not apply any lotions/deodorants the morning of surgery.  Please wear clean clothes to the hospital/surgery center.  FAILURE TO FOLLOW THESE INSTRUCTIONS MAY RESULT IN THE CANCELLATION OF YOUR SURGERY PATIENT SIGNATURE_________________________________  NURSE SIGNATURE__________________________________  ________________________________________________________________________

## 2019-12-28 NOTE — Progress Notes (Signed)
PCP - Billey Gosling, MD Pulmonologist- Dr. Melvyn Novas with clearance in 12-08-19 office visit  Cardiologist -  Chest x-ray -   EKG - 03-06-19 Stress Test -  ECHO - 01-24-17 (FYI) Cardiac Cath -   Sleep Study -  CPAP -   Fasting Blood Sugar -  Checks Blood Sugar _____ times a day  Blood Thinner Instructions: Aspirin Instructions: Last Dose:  Anesthesia review: Hx of DM, HTN  Patient denies shortness of breath, fever, cough and chest pain at PAT appointment   Patient verbalized understanding of instructions that were given to them at the PAT appointment. Patient was also instructed that they will need to review over the PAT instructions again at home before surgery.

## 2019-12-29 ENCOUNTER — Encounter (HOSPITAL_COMMUNITY)
Admission: RE | Admit: 2019-12-29 | Discharge: 2019-12-29 | Disposition: A | Discharge status: Home / Self Care | Payer: Medicare PPO | Source: Ambulatory Visit | Attending: Orthopaedic Surgery | Admitting: Orthopaedic Surgery

## 2019-12-29 ENCOUNTER — Encounter (HOSPITAL_COMMUNITY): Payer: Self-pay

## 2019-12-29 ENCOUNTER — Other Ambulatory Visit: Payer: Self-pay

## 2019-12-29 DIAGNOSIS — Y929 Unspecified place or not applicable: Secondary | ICD-10-CM | POA: Insufficient documentation

## 2019-12-29 DIAGNOSIS — Z79899 Other long term (current) drug therapy: Secondary | ICD-10-CM | POA: Diagnosis not present

## 2019-12-29 DIAGNOSIS — Z7982 Long term (current) use of aspirin: Secondary | ICD-10-CM | POA: Diagnosis not present

## 2019-12-29 DIAGNOSIS — E785 Hyperlipidemia, unspecified: Secondary | ICD-10-CM | POA: Insufficient documentation

## 2019-12-29 DIAGNOSIS — Z794 Long term (current) use of insulin: Secondary | ICD-10-CM | POA: Insufficient documentation

## 2019-12-29 DIAGNOSIS — Z7901 Long term (current) use of anticoagulants: Secondary | ICD-10-CM | POA: Diagnosis not present

## 2019-12-29 DIAGNOSIS — J849 Interstitial pulmonary disease, unspecified: Secondary | ICD-10-CM | POA: Insufficient documentation

## 2019-12-29 DIAGNOSIS — Y999 Unspecified external cause status: Secondary | ICD-10-CM | POA: Insufficient documentation

## 2019-12-29 DIAGNOSIS — Z7902 Long term (current) use of antithrombotics/antiplatelets: Secondary | ICD-10-CM | POA: Insufficient documentation

## 2019-12-29 DIAGNOSIS — S83242A Other tear of medial meniscus, current injury, left knee, initial encounter: Secondary | ICD-10-CM | POA: Insufficient documentation

## 2019-12-29 DIAGNOSIS — E118 Type 2 diabetes mellitus with unspecified complications: Secondary | ICD-10-CM | POA: Insufficient documentation

## 2019-12-29 DIAGNOSIS — Z01812 Encounter for preprocedural laboratory examination: Secondary | ICD-10-CM | POA: Diagnosis not present

## 2019-12-29 DIAGNOSIS — K219 Gastro-esophageal reflux disease without esophagitis: Secondary | ICD-10-CM | POA: Insufficient documentation

## 2019-12-29 DIAGNOSIS — I10 Essential (primary) hypertension: Secondary | ICD-10-CM | POA: Diagnosis not present

## 2019-12-29 DIAGNOSIS — Y939 Activity, unspecified: Secondary | ICD-10-CM | POA: Diagnosis not present

## 2019-12-29 LAB — CBC
HCT: 46.4 % — ABNORMAL HIGH (ref 36.0–46.0)
Hemoglobin: 14.4 g/dL (ref 12.0–15.0)
MCH: 29.4 pg (ref 26.0–34.0)
MCHC: 31 g/dL (ref 30.0–36.0)
MCV: 94.7 fL (ref 80.0–100.0)
Platelets: 232 10*3/uL (ref 150–400)
RBC: 4.9 MIL/uL (ref 3.87–5.11)
RDW: 14.9 % (ref 11.5–15.5)
WBC: 8.1 10*3/uL (ref 4.0–10.5)
nRBC: 0 % (ref 0.0–0.2)

## 2019-12-29 LAB — HEMOGLOBIN A1C
Hgb A1c MFr Bld: 7.4 % — ABNORMAL HIGH (ref 4.8–5.6)
Mean Plasma Glucose: 165.68 mg/dL

## 2019-12-29 LAB — BASIC METABOLIC PANEL
Anion gap: 8 (ref 5–15)
BUN: 12 mg/dL (ref 6–20)
CO2: 27 mmol/L (ref 22–32)
Calcium: 8.9 mg/dL (ref 8.9–10.3)
Chloride: 102 mmol/L (ref 98–111)
Creatinine, Ser: 0.61 mg/dL (ref 0.44–1.00)
GFR calc Af Amer: >60 mL/min (ref >60)
GFR calc non Af Amer: >60 mL/min (ref >60)
Glucose, Bld: 174 mg/dL — ABNORMAL HIGH (ref 70–99)
Potassium: 3.9 mmol/L (ref 3.5–5.1)
Sodium: 137 mmol/L (ref 135–145)

## 2019-12-29 LAB — GLUCOSE, CAPILLARY: Glucose-Capillary: 157 mg/dL — ABNORMAL HIGH (ref 70–99)

## 2019-12-29 LAB — SURGICAL PCR SCREEN
MRSA, PCR: NEGATIVE
Staphylococcus aureus: NEGATIVE

## 2019-12-30 NOTE — Progress Notes (Signed)
Anesthesia Chart Review   Case: [id number] Date/Time: 01/06/20 0815   Procedure: KNEE ARTHROSCOPY WITH MEDIAL MENISECTOMY (Left Knee)   Anesthesia type: Choice   Pre-op diagnosis: LEFT KNEE MEDIAL MENISCUS TEAR   Location[facility] ROOM 06 / [facility]   Surgeons: Hiram Gash, MD      DISCUSSION:49 y.o. 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 Ophelia Charter.   Pt last seen by pulmonologist, Dr. Melvyn Novas, 12/08/2019 for preoperative evaluation.  Pt is maintained on 5mg  prednison daily through the spring/knee surgery.  Per OV note, "You are cleared for knee surgery."  S/p right knee scope under GA 04/01/2019 with no anesthesia complications noted. Prior to this procedure Dr. Melvyn Novas recommended stress dose of steroids DOS.  Anticipate pt can proceed with planned procedure barring acute status change and after evaluation DOS.  VS: BP 121/87   Pulse 84   Temp 36.9 C (Oral)   Resp 18   Ht 5' (1.524 m)   Wt 106.8 kg   SpO2 97%   BMI 45.98 kg/m   PROVIDERS: Binnie Rail, MD is PCP 10/13/2019  Christinia Gully, MD is Pulmonologist  LABS: Labs reviewed: Acceptable for surgery. (all labs ordered are listed, but only abnormal results are displayed)  Labs Reviewed  GLUCOSE, CAPILLARY - Abnormal; Notable for the following components:      Result Value   Glucose-Capillary 157 (*)    All other components within normal limits  SURGICAL PCR SCREEN     IMAGES:   EKG: 03/06/2019 Rate 90 bpm Sinus rhythm  Short PR syndrome Low voltage precordial leads   CV: 01/30/2017 Cardiac Cath  1. Normal right and left heart filling pressures.  2. No pulmonary hypertension.   Echo 01/24/2017 Study Conclusions   - Left ventricle: The cavity size was normal. Wall thickness was  increased  in a pattern of mild LVH. Systolic function was normal.  The estimated ejection fraction was in the range of 60% to 65%.  Wall motion was normal; there were no regional wall motion  abnormalities. Doppler parameters are consistent with abnormal  left ventricular relaxation (grade 1 diastolic dysfunction).  - Aortic valve: There was no stenosis.  - Mitral valve: There was no significant regurgitation.  - Right ventricle: The cavity size was normal. Systolic function  was mildly reduced.  - Tricuspid valve: Peak RV-RA gradient (S): 24 mm Hg.  - Systemic veins: IVC not well-visualized.   Impressions:   - Normal LV size with mild LV hypertrophy. EF 60-65%. Normal RV  size with mildly decreased systolic function. PA pressure not  definitely elevated but TR jet not well-visualized Past Medical History:  Diagnosis Date  . Allergic rhinitis   . Arthritis    Knees ankles  feet  . Bronchitis   . Chronic rhinitis   . Cough   . Dyslipidemia   . Dysphagia   . Dyspnea    occationally over last 2 years  . GERD (gastroesophageal reflux disease)   . Headache(784.0)   . ILD (interstitial lung disease) ([place])   . Mild depression ([place])   . Morbid obesity ([place])   . Type II or diabetes mellitus, uncontrolled 11/2012 dx   Dx 12/08/12: a1c 10.0  . Unspecified essential hypertension   . Urge  incontinence     Past Surgical History:  Procedure Laterality Date  . ABDOMINAL HYSTERECTOMY    . BILATERAL VATS ABLATION  02/15/04  . CESAREAN SECTION    . KNEE ARTHROSCOPY WITH MEDIAL MENISECTOMY Right 04/01/2019   Procedure: KNEE ARTHROSCOPY WITH MEDIAL MENISECTOMY;  Surgeon: Hiram Gash, MD;  Location[facility];  Service: Orthopedics;  Laterality: Right;  . LUNG BIOPSY    . RIGHT HEART CATH N/A 01/30/2017   Procedure: Right Heart Cath;  Surgeon: Larey Dresser, MD;  Location: [facility];  Service: Cardiovascular;  Laterality: N/A;    MEDICATIONS: . albuterol (PROVENTIL HFA;VENTOLIN  HFA) 108 (90 Base) MCG/ACT inhaler  . APPLE CIDER VINEGAR PO  . aspirin 81 MG chewable tablet  . atorvastatin (LIPITOR) 20 MG tablet  . B COMPLEX VITAMINS SL  . calcium-vitamin D (OSCAL WITH D) 500-200 MG-UNIT tablet  . celecoxib (CELEBREX) 100 MG capsule  . Dextromethorphan-guaiFENesin (MUCINEX DM MAXIMUM STRENGTH) 60-1200 MG TB12  . Dulaglutide (TRULICITY) 3 0000000 SOPN  . ELDERBERRY PO  . fluticasone (FLONASE) 50 MCG/ACT nasal spray  . JANUMET XR [phone] MG TB24  . JARDIANCE 25 MG TABS tablet  . ketorolac (TORADOL) 10 MG tablet  . loratadine (CLARITIN) 10 MG tablet  . losartan (COZAAR) 25 MG tablet  . meloxicam (MOBIC) 15 MG tablet  . mometasone-formoterol (DULERA) 100-5 MCG/ACT AERO  . montelukast (SINGULAIR) 10 MG tablet  . ONETOUCH ULTRA test strip  . OXYGEN  . pantoprazole (PROTONIX) 40 MG tablet  . predniSONE (DELTASONE) 5 MG tablet  . Respiratory Therapy Supplies (FLUTTER) DEVI  . Turmeric 500 MG CAPS  . venlafaxine XR (EFFEXOR-XR) 37.5 MG 24 hr capsule  . VITRON-C 65-125 MG TABS   No current facility-administered medications for this encounter.    Konrad Felix, PA-C [facility]Surgical Testing [phone] 12/31/19  10:14 AM

## 2020-01-01 NOTE — H&P (Signed)
PREOPERATIVE H&P  Chief Complaint: LEFT KNEE MEDIAL MENISCUS TEAR  HPI: [name] is a 49 y.o. female who is scheduled for KNEE ARTHROSCOPY WITH MEDIAL MENISECTOMY.   Patient has a past medical history significant for HTN, GERD, asthma, and lung disease who uses 2L O2 as needed with no daytime use.   Patient is a 50 year old established patient who has a history of left knee pain.  This happened a few months ago when she was at church and she was stepping down from the pulpit and she heard a pop and had pain in her left knee. She describes this pain as being identical to the pain she had in her right knee before. Ms. Henjum had a right meniscectomy last year by Dr. Griffin Basil and did very well after this.  She feels the knee catching and locking. She has pain primarily on the lateral side. She has been using Meloxicam and Tylenol as needed for break through pain. She is able to bear weight but is limping with ambulation.   Her symptoms are rated as moderate to severe, and have been worsening.  This is significantly impairing activities of daily living.    Please see clinic note for further details on this patient's care.    She has elected for surgical management.   Past Medical History:  Diagnosis Date  . Allergic rhinitis   . Arthritis    Knees ankles  feet  . Bronchitis   . Chronic rhinitis   . Cough   . Dyslipidemia   . Dysphagia   . Dyspnea    occationally over last 2 years  . GERD (gastroesophageal reflux disease)   . Headache(784.0)   . ILD (interstitial lung disease) ([place])   . Mild depression ([place])   . Morbid obesity ([place])   . Type II or diabetes mellitus, uncontrolled 11/2012 dx   Dx 12/08/12: a1c 10.0  . Unspecified essential hypertension   . Urge incontinence    Past Surgical History:  Procedure Laterality Date  . ABDOMINAL HYSTERECTOMY    . BILATERAL VATS ABLATION  02/15/04  . CESAREAN SECTION    . KNEE ARTHROSCOPY WITH MEDIAL MENISECTOMY Right 04/01/2019   Procedure: KNEE ARTHROSCOPY WITH MEDIAL MENISECTOMY;  Surgeon: Hiram Gash, MD;  Location: [facility];  Service: Orthopedics;  Laterality: Right;  . LUNG BIOPSY    . RIGHT HEART CATH N/A 01/30/2017   Procedure: Right Heart Cath;  Surgeon: Larey Dresser, MD;  Location: [facility];  Service: Cardiovascular;  Laterality: N/A;   Social History   Socioeconomic History  . Marital status: Married    Spouse name: Not on file  . Number of children: 1  . Years of education: Not on file  . Highest education level: Not on file  Occupational History  . 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 Smoking status: Never Smoker  . Smokeless tobacco: Never Used  . Tobacco comment: {  Substance and Sexual Activity  . Alcohol use: No    Alcohol/week: 0.0 standard drinks  . Drug use: No  . Sexual activity: Never  Other Topics Concern  . Not on file  Social History Narrative  . Not on file   Social Determinants of Health   Financial Resource Strain:   . Difficulty of Paying Living Expenses:   Food Insecurity:   . Worried About [employer] of  Food in the Last Year:   . Ward in the Last Year:   Transportation Needs:   . Film/video editor (Medical):   Marland Kitchen Lack of Transportation (Non-Medical):   Physical Activity:   . Days of Exercise per Week:   . Minutes of Exercise per Session:   Stress:   . Feeling of Stress :   Social Connections:   . Frequency of Communication with Friends and Family:   . Frequency of Social Gatherings with Friends and Family:   . Attends Religious Services:   . Active Member of Clubs or Organizations:   . Attends Archivist Meetings:   Marland Kitchen Marital Status:    Family History  Problem Relation Age of Onset  . Sarcoidosis Mother        dxed by transbrochial bx  . Allergies Mother   . Heart disease Father   . Diabetes Father   . Allergies Father   . Coronary artery  disease Father   . Cancer Maternal Grandmother        CA of unknown type  . Cancer Paternal Grandmother        CA of unknown type   Allergies  Allergen Reactions  . Penicillins Shortness Of Breath, Swelling and Other (See Comments)    Has patient had a PCN reaction causing immediate rash, facial/tongue/throat swelling, SOB or lightheadedness with hypotension: yes Has patient had a PCN reaction causing severe rash involving mucus membranes or skin necrosis: no Has patient had a PCN reaction that required hospitalization no Has patient had a PCN reaction occurring within the last 10 years: no If all of the above answers are "NO", then may proceed with Cephalosporin use.   Marland Kitchen Peach Flavor Hives and Other (See Comments)    Fresh peaches only  . Levaquin [Levofloxacin] Nausea Only    Nausea, bloating   Prior to Admission medications   Medication Sig Start Date End Date Taking? Authorizing Provider  albuterol (PROVENTIL HFA;VENTOLIN HFA) 108 (90 Base) MCG/ACT inhaler Inhale 2 puffs into the lungs every 4 (four) hours as needed for wheezing or shortness of breath. 07/08/17  Yes Mabe, Shanon Brow, NP  APPLE CIDER VINEGAR PO Take 450 mg by mouth daily.   Yes [provider]  aspirin 81 MG chewable tablet Chew 1 tablet (81 mg total) by mouth 2 (two) times daily. Patient taking differently: Chew 81 mg by mouth daily.  04/01/19  Yes Hiram Gash, MD  atorvastatin (LIPITOR) 20 MG tablet TAKE ONE TABLET BY MOUTH DAILY Patient taking differently: Take 20 mg by mouth daily.  09/18/19  Yes Burns, Claudina Lick, MD  B COMPLEX VITAMINS SL Place 1 mL under the tongue 4 (four) times a week.   Yes [provider]  calcium-vitamin D (OSCAL WITH D) 500-200 MG-UNIT tablet Take 1 tablet by mouth daily.   Yes [provider]  Dextromethorphan-guaiFENesin (MUCINEX DM MAXIMUM STRENGTH) 60-1200 MG TB12 Take 1 tablet by mouth every 12 (twelve) hours as needed (with flutter valve).   Yes [provider]  Dulaglutide (TRULICITY) 3 0000000 SOPN Inject 3 mg into the skin once a week. 10/14/19  Yes Burns, Claudina Lick, MD  ELDERBERRY PO Take 1,000 mg by mouth daily.   Yes [provider]  fluticasone (FLONASE) 50 MCG/ACT nasal spray Place 2 sprays into both nostrils daily.    Yes [provider]  JANUMET XR [phone] MG TB24 TAKE ONE TABLET BY MOUTH DAILY Patient taking differently: Take  1 tablet by mouth daily.  09/18/19  Yes Burns, Claudina Lick, MD  JARDIANCE 25 MG TABS tablet TAKE ONE TABLET BY MOUTH EVERY MORNING WITH OR WITHOUT FOOD Patient taking differently: Take 25 mg by mouth daily.  09/18/19  Yes Burns, Claudina Lick, MD  loratadine (CLARITIN) 10 MG tablet Take 10 mg by mouth at bedtime.    Yes [provider]  losartan (COZAAR) 25 MG tablet Take 1/2 tablet daily. Patient taking differently: Take 12.5 mg by mouth daily.  12/14/19  Yes Burns, Claudina Lick, MD  meloxicam (MOBIC) 15 MG tablet Take 15 mg by mouth daily. 12/14/19  Yes [provider]  mometasone-formoterol (DULERA) 100-5 MCG/ACT AERO Inhale 1 puff into the lungs 2 (two) times daily.    Yes [provider]  montelukast (SINGULAIR) 10 MG tablet Take 1 tablet (10 mg total) by mouth at bedtime. 02/02/16  Yes Burns, Claudina Lick, MD  pantoprazole (PROTONIX) 40 MG tablet TAKE ONE TABLET BY MOUTH TWICE A DAY BEFORE MEALS Patient taking differently: Take 40 mg by mouth 2 (two) times daily before a meal.  11/13/19  Yes Burns, Claudina Lick, MD  predniSONE (DELTASONE) 5 MG tablet Take 1 tablet (5 mg total) by mouth daily. 12/08/19  Yes Tanda Rockers, MD  Turmeric 500 MG CAPS Take 500 mg by mouth daily.   Yes [provider]  venlafaxine XR (EFFEXOR-XR) 37.5 MG 24 hr capsule TAKE ONE CAPSULE BY MOUTH DAILY WITH BREAKFAST *NEED OFFICE VISIT FOR MORE REFILLS* Patient taking differently: Take 37.5 mg by mouth daily with breakfast.  10/28/19  Yes Burns, Claudina Lick, MD  VITRON-C 65-125 MG TABS Take 1 tablet by mouth  daily.   Yes [provider]  celecoxib (CELEBREX) 100 MG capsule Take 1 capsule (100 mg total) by mouth 2 (two) times daily. Patient not taking: Reported on 12/29/2019 04/01/19 03/31/20  Hiram Gash, MD  ketorolac (TORADOL) 10 MG tablet Take 1 tablet (10 mg total) by mouth every 6 (six) hours as needed. Patient not taking: Reported on 12/29/2019 08/07/19   Hall-Potvin, [place], PA-C  ONETOUCH ULTRA test strip USE TO CHECK BLOOD SUGARS TWO TIMES A DAY 09/18/19   Binnie Rail, MD  OXYGEN 2lpm with sleep and 3lpm with exertion    [provider]  Respiratory Therapy Supplies (FLUTTER) DEVI Use as directed 07/12/17   Tanda Rockers, MD    ROS: All other systems have been reviewed and were otherwise negative with the exception of those mentioned in the HPI and as above.  Physical Exam: General: Alert, no acute distress Cardiovascular: No pedal edema Respiratory: No cyanosis, no use of accessory musculature GI: No organomegaly, abdomen is soft and non-tender Skin: No lesions in the area of chief complaint Neurologic: Sensation intact distally Psychiatric: Patient is competent for consent with normal mood and affect Lymphatic: No axillary or cervical lymphadenopathy  MUSCULOSKELETAL:  Left knee: tender to palpation over medial joint line. Near full ROM. Positive McMurray. Distal motor and sensory function intact.   Imaging: MRI demonstrates a posterior horn medial meniscus tear.  Assessment: LEFT KNEE MEDIAL MENISCUS TEAR  Plan: Plan for Procedure(s): KNEE ARTHROSCOPY WITH MEDIAL MENISECTOMY  The risks benefits and alternatives were discussed with the patient including but not limited to the risks of nonoperative treatment, versus surgical intervention including infection, bleeding, nerve injury,  blood clots, cardiopulmonary complications, morbidity, mortality, among others, and they were willing to proceed.   The patient acknowledged the explanation, agreed to  proceed with the plan and consent was signed.  Patient has received clearance for surgery by her pulmonologist, Dr. Melvyn Novas, as well as her PCP, Dr. Billey Gosling.   Operative Plan: Left knee scope with medial meniscectomy Discharge Medications: Tylenol, Celebrex, Oxycodone, Zofran DVT Prophylaxis: Aspirin Physical Therapy: +/- outpatient PT Special Discharge needs: Knee immobilizer if she receives a block   Ethelda Chick, PA-C  01/01/2020 11:38 AM

## 2020-01-02 ENCOUNTER — Other Ambulatory Visit (HOSPITAL_COMMUNITY)
Admission: RE | Admit: 2020-01-02 | Discharge: 2020-01-02 | Disposition: A | Discharge status: Home / Self Care | Payer: Medicare PPO | Source: Ambulatory Visit | Attending: Orthopaedic Surgery | Admitting: Orthopaedic Surgery

## 2020-01-02 DIAGNOSIS — Z01812 Encounter for preprocedural laboratory examination: Secondary | ICD-10-CM | POA: Insufficient documentation

## 2020-01-02 DIAGNOSIS — Z20822 Contact with and (suspected) exposure to covid-19: Secondary | ICD-10-CM | POA: Diagnosis not present

## 2020-01-02 LAB — SARS CORONAVIRUS 2 (TAT 6-24 HRS): SARS Coronavirus 2: NEGATIVE

## 2020-01-05 NOTE — Progress Notes (Signed)
Pt advised that her surgery time for 01-06-20 is now 10:45 in lieu of 8:30AM. Pt to arrived to Admitting at 8:15 AM, and to have her surgery drink completed by 7:45 AM. Pt verbalized understanding.

## 2020-01-06 ENCOUNTER — Ambulatory Visit (HOSPITAL_COMMUNITY): Payer: Medicare PPO | Admitting: Physician Assistant

## 2020-01-06 ENCOUNTER — Other Ambulatory Visit: Payer: Self-pay

## 2020-01-06 ENCOUNTER — Encounter (HOSPITAL_COMMUNITY)
Admission: RE | Disposition: A | Discharge status: Home / Self Care | Payer: Self-pay | Source: Ambulatory Visit | Attending: Orthopaedic Surgery

## 2020-01-06 ENCOUNTER — Ambulatory Visit (HOSPITAL_COMMUNITY)
Admission: RE | Admit: 2020-01-06 | Discharge: 2020-01-06 | Disposition: A | Discharge status: Home / Self Care | Payer: Medicare PPO | Source: Ambulatory Visit | Attending: Orthopaedic Surgery | Admitting: Orthopaedic Surgery

## 2020-01-06 ENCOUNTER — Ambulatory Visit (HOSPITAL_COMMUNITY): Payer: Medicare PPO | Admitting: Certified Registered"

## 2020-01-06 DIAGNOSIS — Z880 Allergy status to penicillin: Secondary | ICD-10-CM | POA: Insufficient documentation

## 2020-01-06 DIAGNOSIS — S83242A Other tear of medial meniscus, current injury, left knee, initial encounter: Secondary | ICD-10-CM | POA: Diagnosis not present

## 2020-01-06 DIAGNOSIS — I10 Essential (primary) hypertension: Secondary | ICD-10-CM | POA: Insufficient documentation

## 2020-01-06 DIAGNOSIS — Z7952 Long term (current) use of systemic steroids: Secondary | ICD-10-CM | POA: Diagnosis not present

## 2020-01-06 DIAGNOSIS — Z881 Allergy status to other antibiotic agents status: Secondary | ICD-10-CM | POA: Insufficient documentation

## 2020-01-06 DIAGNOSIS — Z6841 Body Mass Index (BMI) 40.0 and over, adult: Secondary | ICD-10-CM | POA: Insufficient documentation

## 2020-01-06 DIAGNOSIS — F329 Major depressive disorder, single episode, unspecified: Secondary | ICD-10-CM | POA: Insufficient documentation

## 2020-01-06 DIAGNOSIS — E785 Hyperlipidemia, unspecified: Secondary | ICD-10-CM | POA: Insufficient documentation

## 2020-01-06 DIAGNOSIS — J449 Chronic obstructive pulmonary disease, unspecified: Secondary | ICD-10-CM | POA: Diagnosis not present

## 2020-01-06 DIAGNOSIS — J45909 Unspecified asthma, uncomplicated: Secondary | ICD-10-CM | POA: Diagnosis not present

## 2020-01-06 DIAGNOSIS — Z7984 Long term (current) use of oral hypoglycemic drugs: Secondary | ICD-10-CM | POA: Diagnosis not present

## 2020-01-06 DIAGNOSIS — Z791 Long term (current) use of non-steroidal anti-inflammatories (NSAID): Secondary | ICD-10-CM | POA: Diagnosis not present

## 2020-01-06 DIAGNOSIS — K219 Gastro-esophageal reflux disease without esophagitis: Secondary | ICD-10-CM | POA: Insufficient documentation

## 2020-01-06 DIAGNOSIS — X58XXXA Exposure to other specified factors, initial encounter: Secondary | ICD-10-CM | POA: Diagnosis not present

## 2020-01-06 DIAGNOSIS — Z79899 Other long term (current) drug therapy: Secondary | ICD-10-CM | POA: Insufficient documentation

## 2020-01-06 DIAGNOSIS — Z7951 Long term (current) use of inhaled steroids: Secondary | ICD-10-CM | POA: Diagnosis not present

## 2020-01-06 DIAGNOSIS — Z7982 Long term (current) use of aspirin: Secondary | ICD-10-CM | POA: Diagnosis not present

## 2020-01-06 DIAGNOSIS — Z9981 Dependence on supplemental oxygen: Secondary | ICD-10-CM | POA: Diagnosis not present

## 2020-01-06 DIAGNOSIS — E119 Type 2 diabetes mellitus without complications: Secondary | ICD-10-CM | POA: Diagnosis not present

## 2020-01-06 DIAGNOSIS — M1990 Unspecified osteoarthritis, unspecified site: Secondary | ICD-10-CM | POA: Insufficient documentation

## 2020-01-06 HISTORY — PX: KNEE ARTHROSCOPY WITH MEDIAL MENISECTOMY: SHX5651

## 2020-01-06 LAB — GLUCOSE, CAPILLARY
Glucose-Capillary: 115 mg/dL — ABNORMAL HIGH (ref 70–99)
Glucose-Capillary: 109 mg/dL — ABNORMAL HIGH (ref 70–99)

## 2020-01-06 SURGERY — ARTHROSCOPY, KNEE, WITH MEDIAL MENISCECTOMY
Anesthesia: General | Site: Knee | Laterality: Left

## 2020-01-06 MED ORDER — PHENYLEPHRINE 40 MCG/ML (10ML) SYRINGE FOR IV PUSH (FOR BLOOD PRESSURE SUPPORT)
PREFILLED_SYRINGE | INTRAVENOUS | Status: DC | PRN
  Administered 2020-01-06 (×2): 120 ug via INTRAVENOUS
  Administered 2020-01-06: 160 ug via INTRAVENOUS
  Administered 2020-01-06 (×3): 120 ug via INTRAVENOUS

## 2020-01-06 MED ORDER — BUPIVACAINE HCL 0.25 % IJ SOLN
INTRAMUSCULAR | Status: AC
  Filled 2020-01-06: qty 1

## 2020-01-06 MED ORDER — LACTATED RINGERS IV SOLN: INTRAVENOUS | Status: DC

## 2020-01-06 MED ORDER — FENTANYL CITRATE (PF) 250 MCG/5ML IJ SOLN
INTRAMUSCULAR | Status: AC
  Filled 2020-01-06: qty 5

## 2020-01-06 MED ORDER — MIDAZOLAM HCL 2 MG/2ML IJ SOLN
INTRAMUSCULAR | Status: AC
  Filled 2020-01-06: qty 2

## 2020-01-06 MED ORDER — MIDAZOLAM HCL 5 MG/5ML IJ SOLN
INTRAMUSCULAR | Status: DC | PRN
  Administered 2020-01-06: 2 mg via INTRAVENOUS

## 2020-01-06 MED ORDER — PROPOFOL 10 MG/ML IV BOLUS
INTRAVENOUS | Status: DC | PRN
  Administered 2020-01-06 (×2): 200 mg via INTRAVENOUS

## 2020-01-06 MED ORDER — OXYCODONE HCL 5 MG PO TABS
ORAL_TABLET | ORAL | Status: AC
  Filled 2020-01-06: qty 1

## 2020-01-06 MED ORDER — CLINDAMYCIN PHOSPHATE 900 MG/50ML IV SOLN
900.0000 mg | INTRAVENOUS | Status: AC
  Administered 2020-01-06: 900 mg via INTRAVENOUS
  Filled 2020-01-06: qty 50

## 2020-01-06 MED ORDER — LIDOCAINE 2% (20 MG/ML) 5 ML SYRINGE
INTRAMUSCULAR | Status: DC | PRN
  Administered 2020-01-06: 40 mg via INTRAVENOUS

## 2020-01-06 MED ORDER — OXYCODONE HCL 5 MG PO TABS: ORAL_TABLET | ORAL | 0 refills | Status: AC

## 2020-01-06 MED ORDER — DEXAMETHASONE SODIUM PHOSPHATE 10 MG/ML IJ SOLN
INTRAMUSCULAR | Status: AC
  Filled 2020-01-06: qty 1

## 2020-01-06 MED ORDER — PROPOFOL 10 MG/ML IV BOLUS
INTRAVENOUS | Status: AC
  Filled 2020-01-06: qty 40

## 2020-01-06 MED ORDER — 0.9 % SODIUM CHLORIDE (POUR BTL) OPTIME
TOPICAL | Status: DC | PRN
  Administered 2020-01-06: 1000 mL

## 2020-01-06 MED ORDER — ONDANSETRON HCL 4 MG/2ML IJ SOLN
INTRAMUSCULAR | Status: AC
  Filled 2020-01-06: qty 2

## 2020-01-06 MED ORDER — ONDANSETRON HCL 4 MG PO TABS: 4.0000 mg | ORAL_TABLET | Freq: Three times a day (TID) | ORAL | 1 refills | Status: AC | PRN

## 2020-01-06 MED ORDER — FENTANYL CITRATE (PF) 100 MCG/2ML IJ SOLN
INTRAMUSCULAR | Status: DC | PRN
  Administered 2020-01-06 (×4): 50 ug via INTRAVENOUS

## 2020-01-06 MED ORDER — LIDOCAINE 2% (20 MG/ML) 5 ML SYRINGE
INTRAMUSCULAR | Status: AC
  Filled 2020-01-06: qty 5

## 2020-01-06 MED ORDER — PHENYLEPHRINE 40 MCG/ML (10ML) SYRINGE FOR IV PUSH (FOR BLOOD PRESSURE SUPPORT)
PREFILLED_SYRINGE | INTRAVENOUS | Status: AC
  Filled 2020-01-06: qty 10

## 2020-01-06 MED ORDER — CHLORHEXIDINE GLUCONATE 4 % EX LIQD: 60.0000 mL | Freq: Once | CUTANEOUS | Status: DC

## 2020-01-06 MED ORDER — CELECOXIB 200 MG PO CAPS: 200.0000 mg | ORAL_CAPSULE | Freq: Two times a day (BID) | ORAL | 1 refills | Status: DC

## 2020-01-06 MED ORDER — SUCCINYLCHOLINE CHLORIDE 200 MG/10ML IV SOSY
PREFILLED_SYRINGE | INTRAVENOUS | Status: AC
  Filled 2020-01-06: qty 10

## 2020-01-06 MED ORDER — MIDAZOLAM HCL 2 MG/2ML IJ SOLN: 0.5000 mg | Freq: Once | INTRAMUSCULAR | Status: DC | PRN

## 2020-01-06 MED ORDER — ASPIRIN 81 MG PO CHEW: 81.0000 mg | CHEWABLE_TABLET | Freq: Two times a day (BID) | ORAL | 0 refills | Status: AC

## 2020-01-06 MED ORDER — MEPERIDINE HCL 50 MG/ML IJ SOLN: 6.2500 mg | INTRAMUSCULAR | Status: DC | PRN

## 2020-01-06 MED ORDER — PROMETHAZINE HCL 25 MG/ML IJ SOLN: 6.2500 mg | INTRAMUSCULAR | Status: DC | PRN

## 2020-01-06 MED ORDER — SCOPOLAMINE 1 MG/3DAYS TD PT72
1.0000 | MEDICATED_PATCH | Freq: Once | TRANSDERMAL | Status: DC
  Administered 2020-01-06: 1.5 mg via TRANSDERMAL
  Filled 2020-01-06: qty 1

## 2020-01-06 MED ORDER — ONDANSETRON HCL 4 MG/2ML IJ SOLN
INTRAMUSCULAR | Status: DC | PRN
  Administered 2020-01-06: 4 mg via INTRAVENOUS

## 2020-01-06 MED ORDER — BUPIVACAINE-EPINEPHRINE 0.25% -1:200000 IJ SOLN
INTRAMUSCULAR | Status: DC | PRN
  Administered 2020-01-06: 40 mL

## 2020-01-06 MED ORDER — DEXAMETHASONE SODIUM PHOSPHATE 10 MG/ML IJ SOLN
INTRAMUSCULAR | Status: DC | PRN
  Administered 2020-01-06: 4 mg via INTRAVENOUS

## 2020-01-06 MED ORDER — ACETAMINOPHEN 500 MG PO TABS: 1000.0000 mg | ORAL_TABLET | Freq: Three times a day (TID) | ORAL | 0 refills | Status: AC

## 2020-01-06 MED ORDER — OXYCODONE HCL 5 MG PO TABS
5.0000 mg | ORAL_TABLET | Freq: Once | ORAL | Status: AC
  Administered 2020-01-06: 5 mg via ORAL

## 2020-01-06 MED ORDER — SODIUM CHLORIDE 0.9 % IR SOLN
Status: DC | PRN
  Administered 2020-01-06 (×2): 3000 mL

## 2020-01-06 MED ORDER — FENTANYL CITRATE (PF) 100 MCG/2ML IJ SOLN
INTRAMUSCULAR | Status: AC
  Filled 2020-01-06: qty 2

## 2020-01-06 MED ORDER — ESMOLOL HCL 100 MG/10ML IV SOLN
INTRAVENOUS | Status: DC | PRN
  Administered 2020-01-06: 20 mg via INTRAVENOUS
  Administered 2020-01-06: 30 mg via INTRAVENOUS

## 2020-01-06 MED ORDER — ROCURONIUM BROMIDE 10 MG/ML (PF) SYRINGE
PREFILLED_SYRINGE | INTRAVENOUS | Status: AC
  Filled 2020-01-06: qty 10

## 2020-01-06 MED ORDER — EPHEDRINE 5 MG/ML INJ
INTRAVENOUS | Status: AC
  Filled 2020-01-06: qty 10

## 2020-01-06 MED ORDER — BUPIVACAINE-EPINEPHRINE (PF) 0.5% -1:200000 IJ SOLN
INTRAMUSCULAR | Status: AC
  Filled 2020-01-06: qty 30

## 2020-01-06 MED ORDER — FENTANYL CITRATE (PF) 100 MCG/2ML IJ SOLN
25.0000 ug | INTRAMUSCULAR | Status: DC | PRN
  Administered 2020-01-06: 50 ug via INTRAVENOUS

## 2020-01-06 MED ORDER — ACETAMINOPHEN 500 MG PO TABS
1000.0000 mg | ORAL_TABLET | Freq: Once | ORAL | Status: AC
  Administered 2020-01-06: 09:00:00 1000 mg via ORAL
  Filled 2020-01-06: qty 2

## 2020-01-06 MED ORDER — PHENYLEPHRINE HCL (PRESSORS) 10 MG/ML IV SOLN
INTRAVENOUS | Status: AC
  Filled 2020-01-06: qty 1

## 2020-01-06 SURGICAL SUPPLY — 43 items
APL PRP STRL LF DISP 70% ISPRP (MISCELLANEOUS) ×1
BANDAGE ESMARK 6X9 LF (GAUZE/BANDAGES/DRESSINGS) IMPLANT
BLADE CLIPPER SURG (BLADE) IMPLANT
BLADE EXCALIBUR 4.0MM X 13CM (MISCELLANEOUS)
BLADE EXCALIBUR 4.0X13 (MISCELLANEOUS) IMPLANT
BNDG CMPR 9X6 STRL LF SNTH (GAUZE/BANDAGES/DRESSINGS)
BNDG ELASTIC 6X5.8 VLCR STR LF (GAUZE/BANDAGES/DRESSINGS) ×3 IMPLANT
BNDG ESMARK 6X9 LF (GAUZE/BANDAGES/DRESSINGS)
CHLORAPREP W/TINT 26 (MISCELLANEOUS) ×3 IMPLANT
CLOSURE STERI-STRIP 1/2X4 (GAUZE/BANDAGES/DRESSINGS) ×1
CLSR STERI-STRIP ANTIMIC 1/2X4 (GAUZE/BANDAGES/DRESSINGS) ×2 IMPLANT
COVER SURGICAL LIGHT HANDLE (MISCELLANEOUS) ×3 IMPLANT
COVER WAND RF STERILE (DRAPES) IMPLANT
CUFF TOURN SGL QUICK 34 (TOURNIQUET CUFF)
CUFF TRNQT CYL 34X4.125X (TOURNIQUET CUFF) IMPLANT
DECANTER SPIKE VIAL GLASS SM (MISCELLANEOUS) ×3 IMPLANT
DISSECTOR 4.0MMX13CM CVD (MISCELLANEOUS) ×2 IMPLANT
DRAPE ARTHROSCOPY W/POUCH 114 (DRAPES) ×3 IMPLANT
DRAPE U-SHAPE 47X51 STRL (DRAPES) ×3 IMPLANT
DRSG PAD ABDOMINAL 8X10 ST (GAUZE/BANDAGES/DRESSINGS) ×7 IMPLANT
ELECT PENCIL ROCKER SW 15FT (MISCELLANEOUS) IMPLANT
EVACUATOR 1/8 PVC DRAIN (DRAIN) IMPLANT
GAUZE SPONGE 4X4 12PLY STRL (GAUZE/BANDAGES/DRESSINGS) ×3 IMPLANT
GLOVE BIO SURGEON STRL SZ 6.5 (GLOVE) ×4 IMPLANT
GLOVE BIO SURGEONS STRL SZ 6.5 (GLOVE) ×2
GLOVE BIOGEL PI IND STRL 6.5 (GLOVE) ×1 IMPLANT
GLOVE BIOGEL PI IND STRL 8 (GLOVE) ×2 IMPLANT
GLOVE BIOGEL PI INDICATOR 6.5 (GLOVE) ×2
GLOVE BIOGEL PI INDICATOR 8 (GLOVE) ×4
GLOVE ECLIPSE 8.0 STRL XLNG CF (GLOVE) ×3 IMPLANT
GLOVE SURG ORTHO 8.0 STRL STRW (GLOVE) ×3 IMPLANT
GOWN STRL REUS W/ TWL LRG LVL3 (GOWN DISPOSABLE) ×1 IMPLANT
GOWN STRL REUS W/TWL 2XL LVL3 (GOWN DISPOSABLE) ×3 IMPLANT
GOWN STRL REUS W/TWL LRG LVL3 (GOWN DISPOSABLE) ×6 IMPLANT
MANIFOLD NEPTUNE II (INSTRUMENTS) ×3 IMPLANT
PACK ARTHROSCOPY DSU (CUSTOM PROCEDURE TRAY) ×3 IMPLANT
PADDING CAST COTTON 6X4 STRL (CAST SUPPLIES) ×3 IMPLANT
PORT APPOLLO RF 90DEGREE MULTI (SURGICAL WAND) IMPLANT
SPONGE LAP 4X18 RFD (DISPOSABLE) ×3 IMPLANT
SUT MNCRL AB 4-0 PS2 18 (SUTURE) ×3 IMPLANT
TOWEL OR 17X26 10 PK STRL BLUE (TOWEL DISPOSABLE) ×3 IMPLANT
TUBING ARTHROSCOPY IRRIG 16FT (MISCELLANEOUS) ×3 IMPLANT
WRAP KNEE MAXI GEL POST OP (GAUZE/BANDAGES/DRESSINGS) ×2 IMPLANT

## 2020-01-06 NOTE — Anesthesia Preprocedure Evaluation (Addendum)
Anesthesia Evaluation  Patient identified by MRN, date of birth, ID band Patient awake    Reviewed: Allergy & Precautions, NPO status , Patient's Chart, lab work & pertinent test results  History of Anesthesia Complications Negative for: history of anesthetic complications  Airway Mallampati: II  TM Distance: >3 FB Neck ROM: Full    Dental  (+) Dental Advisory Given   Pulmonary COPD,  COPD inhaler and oxygen dependent,  01/02/2020 SARS coronavirus NEG   breath sounds clear to auscultation + decreased breath sounds      Cardiovascular hypertension, Pt. on medications (-) angina Rhythm:Regular Rate:Normal  '18 cath: Normal right and left heart filling pressures. No pulmonary hypertension.  '18 ECHO: EF 60-65%, valves OK   Neuro/Psych  Headaches, Anxiety Depression    GI/Hepatic Neg liver ROS, GERD  Medicated and Controlled,  Endo/Other  diabetes (glu 115), Oral Hypoglycemic AgentsMorbid obesity  Renal/GU negative Renal ROS     Musculoskeletal  (+) Arthritis ,   Abdominal (+) + obese,   Peds  Hematology negative hematology ROS (+)   Anesthesia Other Findings   Reproductive/Obstetrics                            Anesthesia Physical Anesthesia Plan  ASA: III  Anesthesia Plan: General   Post-op Pain Management:    Induction: Intravenous  PONV Risk Score and Plan: 3 and Ondansetron, Dexamethasone, Treatment may vary due to age or medical condition and Scopolamine patch - Pre-op  Airway Management Planned: LMA  Additional Equipment:   Intra-op Plan:   Post-operative Plan:   Informed Consent: I have reviewed the patients History and Physical, chart, labs and discussed the procedure including the risks, benefits and alternatives for the proposed anesthesia with the patient or authorized representative who has indicated his/her understanding and acceptance.     Dental advisory  given  Plan Discussed with: CRNA and Surgeon  Anesthesia Plan Comments:        Anesthesia Quick Evaluation

## 2020-01-06 NOTE — Interval H&P Note (Signed)
All questions answered

## 2020-01-06 NOTE — Anesthesia Postprocedure Evaluation (Signed)
Anesthesia Post Note  Patient: [name]  Procedure(s) Performed: KNEE ARTHROSCOPY WITH MEDIAL MENISECTOMY (Left Knee)     Patient location during evaluation: PACU Anesthesia Type: General Level of consciousness: awake and alert, oriented and patient cooperative Pain management: pain level controlled Vital Signs Assessment: post-procedure vital signs reviewed and stable Respiratory status: spontaneous breathing, nonlabored ventilation, respiratory function stable and patient connected to face mask oxygen Cardiovascular status: blood pressure returned to baseline and stable Postop Assessment: no apparent nausea or vomiting Anesthetic complications: no    Last Vitals:  Vitals:   01/06/20 1130 01/06/20 1230  BP: (!) 132/97 126/88  Pulse: 94 98  Resp: 17 20  Temp:  (!) 36.3 C  SpO2: 100% 94%    Last Pain:  Vitals:   01/06/20 1300  TempSrc:   PainSc: 4                  Janiaya Ryser,E. Hazim Treadway

## 2020-01-06 NOTE — Op Note (Signed)
Orthopaedic Surgery Operative Note (CSN: [id number])  [name]  [birth year] Date of Surgery: 01/06/2020   Diagnoses:  LEFT KNEE MEDIAL MENISCUS TEAR  Procedure: Left partial medial meniscectomy and chondroplasty arthroscopic   Operative Finding Exam under anesthesia: Full motion no limitation Suprapatellar pouch: Normal, no loose bodies Patellofemoral Compartment: Grade 4 central cartilage loss on the trochlea with areas of grade 3 and 4 changes on the superior pole of the patella Medial Compartment: Grade 2 changes on the medial femoral condyle and a radial tear about 15 millimeters medial to the root of the meniscus that was not amenable to repair.  We resected about 40% total meniscal volume but there effectively very few residual fibers that were maintained due to the radial tear.  We did not feel was amenable to repair. Lateral Compartment: Normal Intercondylar Notch: Normal  Successful completion of the planned procedure.  Patient's arthritis is worse than her contralateral knee and if she fails the surgery she may be a candidate for future total knee arthroplasty.  Post-operative plan: The patient will be weightbearing to tolerance.  The patient will be discharged home.  If she is having soreness medially she may be a candidate for a small hinged brace.  DVT prophylaxis Aspirin 81 mg twice daily for 6 weeks.  Pain control with PRN pain medication preferring oral medicines.  Follow up plan will be scheduled in approximately 7 days for incision check and XR.  Post-Op Diagnosis: Same Surgeons:Primary: Hiram Gash, MD Assistants:Caroline McBane PA-C Location: [facility] Anesthesia: General with local anesthetic Antibiotics: Ancef 3 g Tourniquet time: * No tourniquets in log * Estimated Blood Loss: Minimal Complications: None Specimens: None Implants: * No implants in log *  Indications for Surgery:   [name] is a 49 y.o. female with continued pain on the left medial  knee after previous right partial medial meniscectomy which went very well.  MRI demonstrated a radial tear of the medial meniscus and some mild to moderate arthritis in the patellofemoral medial compartments.  Benefits and risks of operative and nonoperative management were discussed prior to surgery with patient/guardian(s) and informed consent form was completed.  Specific risks including infection, need for additional surgery, continued pain, continued mechanical symptoms, need for arthroplasty amongst others.   Procedure:   The patient was identified properly. Informed consent was obtained and the surgical site was marked. The patient was taken up to suite where general anesthesia was induced. The patient was placed in the supine position with a post against the surgical leg and a nonsterile tourniquet applied. The surgical leg was then prepped and draped usual sterile fashion.  A standard surgical timeout was performed.  2 standard anterior portals were made and diagnostic arthroscopy performed. Please note the findings as noted above.  We performed to partial medial meniscectomy at the radial tear using a combination of a shaver and a basket.  We did trephinate the MCL slightly to aid in our approach.  Meniscus was taken back to a stable base.  Was not amenable to repair.  Gentle chondroplasty was formed to the medial femoral condyle, medial tibial plateau and the patellofemoral joint including the trochlea primarily.  Incisions closed with absorbable suture. The patient was awoken from general anesthesia and taken to the PACU in stable condition without complication.   Noemi Chapel, PA-C, present and scrubbed throughout the case, critical for completion in a timely fashion, and for retraction, instrumentation, closure.

## 2020-01-06 NOTE — Transfer of Care (Signed)
Immediate Anesthesia Transfer of Care Note  Patient: [name]  Procedure(s) Performed: KNEE ARTHROSCOPY WITH MEDIAL MENISECTOMY (Left Knee)  Patient Location: PACU  Anesthesia Type:General  Level of Consciousness: awake, drowsy, patient cooperative and responds to stimulation  Airway & Oxygen Therapy: Patient Spontanous Breathing and Patient connected to face mask oxygen  Post-op Assessment: Report given to RN and Post -op Vital signs reviewed and stable  Post vital signs: Reviewed and stable  Last Vitals:  Vitals Value Taken Time  BP 129/84 01/06/20 1038  Temp    Pulse 100 01/06/20 1039  Resp 21 01/06/20 1039  SpO2 100 % 01/06/20 1039  Vitals shown include unvalidated device data.  Last Pain:  Vitals:   01/06/20 0835  TempSrc: Oral      Patients Stated Pain Goal: 4 ([id number])  Complications: No apparent anesthesia complications

## 2020-01-06 NOTE — Anesthesia Procedure Notes (Signed)
Procedure Name: LMA Insertion Date/Time: 01/06/2020 9:50 AM Performed by: Silas [place], CRNA Pre-anesthesia Checklist: Patient identified, Emergency Drugs available, Suction available and Patient being monitored Patient Re-evaluated:Patient Re-evaluated prior to induction Oxygen Delivery Method: Circle system utilized Preoxygenation: Pre-oxygenation with 100% oxygen Induction Type: IV induction Ventilation: Mask ventilation without difficulty LMA: LMA inserted LMA Size: 4.0 Tube type: Oral Number of attempts: 1 Placement Confirmation: positive ETCO2 and breath sounds checked- equal and bilateral Tube secured with: Tape Dental Injury: Teeth and Oropharynx as per pre-operative assessment

## 2020-01-12 DIAGNOSIS — S83242D Other tear of medial meniscus, current injury, left knee, subsequent encounter: Secondary | ICD-10-CM | POA: Diagnosis not present

## 2020-01-18 DIAGNOSIS — M25562 Pain in left knee: Secondary | ICD-10-CM | POA: Diagnosis not present

## 2020-01-18 DIAGNOSIS — M6281 Muscle weakness (generalized): Secondary | ICD-10-CM | POA: Diagnosis not present

## 2020-01-18 DIAGNOSIS — M25662 Stiffness of left knee, not elsewhere classified: Secondary | ICD-10-CM | POA: Diagnosis not present

## 2020-01-18 DIAGNOSIS — R262 Difficulty in walking, not elsewhere classified: Secondary | ICD-10-CM | POA: Diagnosis not present

## 2020-01-20 DIAGNOSIS — M25662 Stiffness of left knee, not elsewhere classified: Secondary | ICD-10-CM | POA: Diagnosis not present

## 2020-01-20 DIAGNOSIS — R262 Difficulty in walking, not elsewhere classified: Secondary | ICD-10-CM | POA: Diagnosis not present

## 2020-01-20 DIAGNOSIS — M6281 Muscle weakness (generalized): Secondary | ICD-10-CM | POA: Diagnosis not present

## 2020-01-20 DIAGNOSIS — M25562 Pain in left knee: Secondary | ICD-10-CM | POA: Diagnosis not present

## 2020-01-25 DIAGNOSIS — M722 Plantar fascial fibromatosis: Secondary | ICD-10-CM | POA: Diagnosis not present

## 2020-01-25 DIAGNOSIS — E661 Drug-induced obesity: Secondary | ICD-10-CM | POA: Diagnosis not present

## 2020-01-25 DIAGNOSIS — R091 Pleurisy: Secondary | ICD-10-CM | POA: Diagnosis not present

## 2020-01-28 DIAGNOSIS — M25562 Pain in left knee: Secondary | ICD-10-CM | POA: Diagnosis not present

## 2020-01-28 DIAGNOSIS — M25662 Stiffness of left knee, not elsewhere classified: Secondary | ICD-10-CM | POA: Diagnosis not present

## 2020-01-28 DIAGNOSIS — R262 Difficulty in walking, not elsewhere classified: Secondary | ICD-10-CM | POA: Diagnosis not present

## 2020-01-28 DIAGNOSIS — M6281 Muscle weakness (generalized): Secondary | ICD-10-CM | POA: Diagnosis not present

## 2020-02-02 DIAGNOSIS — M6281 Muscle weakness (generalized): Secondary | ICD-10-CM | POA: Diagnosis not present

## 2020-02-02 DIAGNOSIS — L723 Sebaceous cyst: Secondary | ICD-10-CM | POA: Diagnosis not present

## 2020-02-02 DIAGNOSIS — R262 Difficulty in walking, not elsewhere classified: Secondary | ICD-10-CM | POA: Diagnosis not present

## 2020-02-02 DIAGNOSIS — M25662 Stiffness of left knee, not elsewhere classified: Secondary | ICD-10-CM | POA: Diagnosis not present

## 2020-02-02 DIAGNOSIS — M25562 Pain in left knee: Secondary | ICD-10-CM | POA: Diagnosis not present

## 2020-02-09 DIAGNOSIS — R262 Difficulty in walking, not elsewhere classified: Secondary | ICD-10-CM | POA: Diagnosis not present

## 2020-02-09 DIAGNOSIS — M25662 Stiffness of left knee, not elsewhere classified: Secondary | ICD-10-CM | POA: Diagnosis not present

## 2020-02-09 DIAGNOSIS — M6281 Muscle weakness (generalized): Secondary | ICD-10-CM | POA: Diagnosis not present

## 2020-02-09 DIAGNOSIS — M25562 Pain in left knee: Secondary | ICD-10-CM | POA: Diagnosis not present

## 2020-02-10 DIAGNOSIS — N907 Vulvar cyst: Secondary | ICD-10-CM | POA: Diagnosis not present

## 2020-02-10 DIAGNOSIS — N9089 Other specified noninflammatory disorders of vulva and perineum: Secondary | ICD-10-CM | POA: Diagnosis not present

## 2020-02-10 DIAGNOSIS — L723 Sebaceous cyst: Secondary | ICD-10-CM | POA: Diagnosis not present

## 2020-02-12 ENCOUNTER — Encounter: Payer: Self-pay | Admitting: Internal Medicine

## 2020-02-16 DIAGNOSIS — M25662 Stiffness of left knee, not elsewhere classified: Secondary | ICD-10-CM | POA: Diagnosis not present

## 2020-02-16 DIAGNOSIS — R262 Difficulty in walking, not elsewhere classified: Secondary | ICD-10-CM | POA: Diagnosis not present

## 2020-02-16 DIAGNOSIS — M25562 Pain in left knee: Secondary | ICD-10-CM | POA: Diagnosis not present

## 2020-02-16 DIAGNOSIS — M6281 Muscle weakness (generalized): Secondary | ICD-10-CM | POA: Diagnosis not present

## 2020-02-18 DIAGNOSIS — R262 Difficulty in walking, not elsewhere classified: Secondary | ICD-10-CM | POA: Diagnosis not present

## 2020-02-18 DIAGNOSIS — M6281 Muscle weakness (generalized): Secondary | ICD-10-CM | POA: Diagnosis not present

## 2020-02-18 DIAGNOSIS — M25662 Stiffness of left knee, not elsewhere classified: Secondary | ICD-10-CM | POA: Diagnosis not present

## 2020-02-18 DIAGNOSIS — M25562 Pain in left knee: Secondary | ICD-10-CM | POA: Diagnosis not present

## 2020-02-24 DIAGNOSIS — R091 Pleurisy: Secondary | ICD-10-CM | POA: Diagnosis not present

## 2020-02-24 DIAGNOSIS — M722 Plantar fascial fibromatosis: Secondary | ICD-10-CM | POA: Diagnosis not present

## 2020-02-24 DIAGNOSIS — E661 Drug-induced obesity: Secondary | ICD-10-CM | POA: Diagnosis not present

## 2020-03-21 ENCOUNTER — Other Ambulatory Visit: Payer: Self-pay

## 2020-03-21 ENCOUNTER — Ambulatory Visit: Payer: Medicare PPO | Admitting: Family

## 2020-03-21 VITALS — BP 116/78 | HR 84 | Temp 98.3°F | Ht 60.0 in | Wt 242.6 lb

## 2020-03-21 DIAGNOSIS — Z113 Encounter for screening for infections with a predominantly sexual mode of transmission: Secondary | ICD-10-CM

## 2020-03-21 DIAGNOSIS — E119 Type 2 diabetes mellitus without complications: Secondary | ICD-10-CM

## 2020-03-21 DIAGNOSIS — N898 Other specified noninflammatory disorders of vagina: Secondary | ICD-10-CM | POA: Diagnosis not present

## 2020-03-21 LAB — HEMOGLOBIN A1C: Hgb A1c MFr Bld: 7.6 % — ABNORMAL HIGH (ref 4.6–6.5)

## 2020-03-21 MED ORDER — VALACYCLOVIR HCL 1 G PO TABS: 1000.0000 mg | ORAL_TABLET | Freq: Two times a day (BID) | ORAL | 0 refills | Status: DC

## 2020-03-21 NOTE — Progress Notes (Signed)
[name] is a 49 y.o. female with the following history as recorded in EpicCare:  Patient Active Problem List   Diagnosis Date Noted  . History of nephrolithiasis 10/13/2019  . Preop examination 03/06/2019  . Anxiety 02/05/2018  . Peroneal tendinitis of lower leg, right 10/22/2017  . Chronic respiratory failure with hypoxia ([place]) 07/20/2017  . Knee pain, left 02/02/2016  . Cough variant asthma 03/09/2015  . Diabetes type 2, uncontrolled ([place]) 12/09/2012  . Dyslipidemia   . Acute on chronic respiratory failure with hypoxia ([place]) 04/05/2012  . Allergic rhinitis 01/05/2010  . Essential hypertension 09/07/2009  . URINARY INCONTINENCE, URGE 03/24/2009  . CHRONIC RHINITIS 12/23/2008  . Morbid obesity due to excess calories ([place]) 10/10/2007  . INTERSTITIAL LUNG DISEASE 10/10/2007  . GASTROESOPHAGEAL REFLUX DISEASE 10/10/2007    Current Outpatient Medications  Medication Sig Dispense Refill  . albuterol (PROVENTIL HFA;VENTOLIN HFA) 108 (90 Base) MCG/ACT inhaler Inhale 2 puffs into the lungs every 4 (four) hours as needed for wheezing or shortness of breath. 1 Inhaler 0  . APPLE CIDER VINEGAR PO Take 450 mg by mouth daily.    Marland Kitchen atorvastatin (LIPITOR) 20 MG tablet TAKE ONE TABLET BY MOUTH DAILY (Patient taking differently: Take 20 mg by mouth daily. ) 90 tablet 1  . B COMPLEX VITAMINS SL Place 1 mL under the tongue 4 (four) times a week.    . calcium-vitamin D (OSCAL WITH D) 500-200 MG-UNIT tablet Take 1 tablet by mouth daily.    Marland Kitchen Dextromethorphan-guaiFENesin (MUCINEX DM MAXIMUM STRENGTH) 60-1200 MG TB12 Take 1 tablet by mouth every 12 (twelve) hours as needed (with flutter valve).    . Dulaglutide (TRULICITY) 3 MB/8.4YK SOPN Inject 3 mg into the skin once a week. 4 pen 5  . ELDERBERRY PO Take 1,000 mg by mouth daily.    . fluticasone (FLONASE) 50 MCG/ACT nasal spray Place 2 sprays into both nostrils daily.     Marland Kitchen JANUMET XR [phone] MG TB24 TAKE ONE TABLET BY MOUTH DAILY (Patient taking  differently: Take 1 tablet by mouth daily. ) 90 tablet 1  . JARDIANCE 25 MG TABS tablet TAKE ONE TABLET BY MOUTH EVERY MORNING WITH OR WITHOUT FOOD (Patient taking differently: Take 25 mg by mouth daily. ) 90 tablet 1  . loratadine (CLARITIN) 10 MG tablet Take 10 mg by mouth at bedtime.     Marland Kitchen losartan (COZAAR) 25 MG tablet Take 1/2 tablet daily. (Patient taking differently: Take 12.5 mg by mouth daily. ) 45 tablet 1  . meloxicam (MOBIC) 15 MG tablet     . mometasone-formoterol (DULERA) 100-5 MCG/ACT AERO Inhale 1 puff into the lungs 2 (two) times daily.     . montelukast (SINGULAIR) 10 MG tablet Take 1 tablet (10 mg total) by mouth at bedtime. 30 tablet 5  . ONETOUCH ULTRA test strip USE TO CHECK BLOOD SUGARS TWO TIMES A DAY 100 strip 2  . OXYGEN 2lpm with sleep and 3lpm with exertion    . pantoprazole (PROTONIX) 40 MG tablet TAKE ONE TABLET BY MOUTH TWICE A DAY BEFORE MEALS (Patient taking differently: Take 40 mg by mouth 2 (two) times daily before a meal. ) 120 tablet 1  . predniSONE (DELTASONE) 5 MG tablet Take 1 tablet (5 mg total) by mouth daily. 100 tablet 1  . Respiratory Therapy Supplies (FLUTTER) DEVI Use as directed 1 each 0  . Turmeric 500 MG CAPS Take 500 mg by mouth daily.    Marland Kitchen venlafaxine XR (EFFEXOR-XR) 37.5 MG  24 hr capsule TAKE ONE CAPSULE BY MOUTH DAILY WITH BREAKFAST *NEED OFFICE VISIT FOR MORE REFILLS* (Patient taking differently: Take 37.5 mg by mouth daily with breakfast. ) 90 capsule 1  . VITRON-C 65-125 MG TABS Take 1 tablet by mouth daily.    . valACYclovir (VALTREX) 1000 MG tablet Take 1 tablet (1,000 mg total) by mouth 2 (two) times daily. 20 tablet 0   No current facility-administered medications for this visit.    Allergies: Penicillins, Peach flavor, and Levaquin [levofloxacin]  Past Medical History:  Diagnosis Date  . Allergic rhinitis   . Arthritis    Knees ankles  feet  . Bronchitis   . Chronic rhinitis   . Cough   . Dyslipidemia   . Dysphagia   .  Dyspnea    occationally over last 2 years  . GERD (gastroesophageal reflux disease)   . Headache(784.0)   . ILD (interstitial lung disease) ([place])   . Mild depression ([place])   . Morbid obesity ([place])   . Type II or diabetes mellitus, uncontrolled 11/2012 dx   Dx 12/08/12: a1c 10.0  . Unspecified essential hypertension   . Urge incontinence     Past Surgical History:  Procedure Laterality Date  . ABDOMINAL HYSTERECTOMY    . BILATERAL VATS ABLATION  02/15/04  . CESAREAN SECTION    . KNEE ARTHROSCOPY WITH MEDIAL MENISECTOMY Right 04/01/2019   Procedure: KNEE ARTHROSCOPY WITH MEDIAL MENISECTOMY;  Surgeon: Hiram Gash, MD;  Location: [facility];  Service: Orthopedics;  Laterality: Right;  . KNEE ARTHROSCOPY WITH MEDIAL MENISECTOMY Left 01/06/2020   Procedure: KNEE ARTHROSCOPY WITH MEDIAL MENISECTOMY;  Surgeon: Hiram Gash, MD;  Location: [facility];  Service: Orthopedics;  Laterality: Left;  . LUNG BIOPSY    . RIGHT HEART CATH N/A 01/30/2017   Procedure: Right Heart Cath;  Surgeon: Larey Dresser, MD;  Location: [facility];  Service: Cardiovascular;  Laterality: N/A;    Family History  Problem Relation Age of Onset  . Sarcoidosis Mother        dxed by transbrochial bx  . Allergies Mother   . Heart disease Father   . Diabetes Father   . Allergies Father   . Coronary artery disease Father   . Cancer Maternal Grandmother        CA of unknown type  . Cancer Paternal Grandmother        CA of unknown type    Social History   Tobacco Use  . Smoking status: Never Smoker  . Smokeless tobacco: Never Used  . Tobacco comment: {  Substance Use Topics  . Alcohol use: No    Alcohol/week: 0.0 standard drinks    Subjective:  Patient notes that she had a sebaceous cyst removed by GYN 5-6 weeks ago- no complications; is now concerned that she some type of STD- now complaining of vaginal burning; would like to get STD screen;  Recently had unprotected sex; Has found out that recent partner does  have HSV but has been on suppressive Valtrex for at least 15 years;    Objective:  Vitals:   03/21/20 1023  BP: 116/78  Pulse: 84  Temp: 98.3 F (36.8 C)  TempSrc: Oral  SpO2: 99%  Weight: 242 lb 9.6 oz (110 kg)  Height: 5' (1.524 m)    General: Well developed, well nourished, in no acute distress  Skin : Warm and dry.  Head: Normocephalic and atraumatic  Lungs: Respirations unlabored;  Neurologic: Alert and oriented;  speech intact; face symmetrical;  Pelvic exam- external exam shows ulcerated sores in outer vaginal area  Assessment:  1. Screen for STD (sexually transmitted disease)   2. Type 2 diabetes mellitus without complication, without long-term current use of insulin ([place])   3. 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  Safety protocols were in place, including screening questions prior to the visit, additional usage of staff PPE, and extensive cleaning of exam room while observing appropriate contact time as indicated for disinfecting solutions.     No follow-ups on file.  Orders Placed This Encounter  Procedures  . GC/Chlamydia Probe Amp([facility])  . HgB A1c  . HIV antibody (with reflex)  . RPR    Requested Prescriptions   Signed Prescriptions Disp Refills  . valACYclovir (VALTREX) 1000 MG tablet 20 tablet 0    Sig: Take 1 tablet (1,000 mg total) by mouth 2 (two) times daily.

## 2020-03-21 NOTE — Patient Instructions (Signed)
Genital Herpes °Genital herpes is a common sexually transmitted infection (STI) that is caused by a virus. The virus spreads from person to person through sexual contact. Infection can cause itching, blisters, and sores around the genitals or rectum. Symptoms may last several days and then go away This is called an outbreak. However, the virus remains in your body, so you may have more outbreaks in the future. The time between outbreaks varies and can be months or years. °Genital herpes affects men and women. It is particularly concerning for pregnant women because the virus can be passed to the baby during delivery and can cause serious problems. Genital herpes is also a concern for people who have a weak disease-fighting (immune) system. °What are the causes? °This condition is caused by the herpes simplex virus (HSV) type 1 or type 2. The virus may spread through: °· Sexual contact with an infected person, including vaginal, anal, and oral sex. °· Contact with fluid from a herpes sore. °· The skin. This means that you can get herpes from an infected partner even if he or she does not have a visible sore or does not know that he or she is infected. °What increases the risk? °You are more likely to develop this condition if: °· You have sex with many partners. °· You do not use latex condoms during sex. °What are the signs or symptoms? °Most people do not have symptoms (asymptomatic) or have mild symptoms that may be mistaken for other skin problems. Symptoms may include: °· Small red bumps near the genitals, rectum, or mouth. These bumps turn into blisters and then turn into sores. °· Flu-like symptoms, including: °? Fever. °? Body aches. °? Swollen lymph nodes. °? Headache. °· Painful urination. °· Pain and itching in the genital area or rectal area. °· Vaginal discharge. °· Tingling or shooting pain in the legs and buttocks. °Generally, symptoms are more severe and last longer during the first (primary)  outbreak. Flu-like symptoms are also more common during the primary outbreak. °How is this diagnosed? °Genital herpes may be diagnosed based on: °· A physical exam. °· Your medical history. °· Blood tests. °· A test of a fluid sample (culture) from an open sore. °How is this treated? °There is no cure for this condition, but treatment with antiviral medicines that are taken by mouth (orally) can do the following: °· Speed up healing and relieve symptoms. °· Help to reduce the spread of the virus to sexual partners. °· Limit the chance of future outbreaks, or make future outbreaks shorter. °· Lessen symptoms of future outbreaks. °Your health care provider may also recommend pain relief medicines, such as aspirin or ibuprofen. °Follow these instructions at home: °Sexual activity °· Do not have sexual contact during active outbreaks. °· Practice safe sex. Latex condoms and female condoms may help prevent the spread of the herpes virus. °General instructions °· Keep the affected areas dry and clean. °· Take over-the-counter and prescription medicines only as told by your health care provider. °· Avoid rubbing or touching blisters and sores. If you do touch blisters or sores: °? Wash your hands thoroughly with soap and water. °? Do not touch your eyes afterward. °· To help relieve pain or itching, you may take the following actions as directed by your health care provider: °? Apply a cold, wet cloth (cold compress) to affected areas 4-6 times a day. °? Apply a substance that protects your skin and reduces bleeding (astringent). °? Apply a gel that   helps relieve pain around sores (lidocaine gel). ? Take a warm, shallow bath that cleans the genital area (sitz bath).  Keep all follow-up visits as told by your health care provider. This is important. How is this prevented?  Use condoms. Although anyone can get genital herpes during sexual contact, even with the use of a condom, a condom can provide some  protection.  Avoid having multiple sexual partners.  Talk with your sexual partner about any symptoms either of you may have. Also, talk with your partner about any history of STIs.  Get tested for STIs before you have sex. Ask your partner to do the same.  Do not have sexual contact if you have symptoms of genital herpes. Contact a health care provider if:  Your symptoms are not improving with medicine.  Your symptoms return.  You have new symptoms.  You have a fever.  You have abdominal pain.  You have redness, swelling, or pain in your eye.  You notice new sores on other parts of your body.  You are a woman and experience bleeding between menstrual periods.  You have had herpes and you become pregnant or plan to become pregnant. Summary  Genital herpes is a common sexually transmitted infection (STI) that is caused by the herpes simplex virus (HSV) type 1 or type 2.  These viruses are most often spread through sexual contact with an infected person.  You are more likely to develop this condition if you have sex with many partners or you have unprotected sex.  Most people do not have symptoms (asymptomatic) or have mild symptoms that may be mistaken for other skin problems. Symptoms occur as outbreaks that may happen months or years apart.  There is no cure for this condition, but treatment with oral antiviral medicines can reduce symptoms, reduce the chance of spreading the virus to a partner, prevent future outbreaks, or shorten future outbreaks. This information is not intended to replace advice given to you by your health care provider. Make sure you discuss any questions you have with your health care provider. Document Revised: 03/24/2018 Document Reviewed: 08/17/2016 Elsevier Patient Education  2020 Elsevier Inc.  

## 2020-03-22 LAB — HIV ANTIBODY (ROUTINE TESTING W REFLEX): HIV 1&2 Ab, 4th Generation: NONREACTIVE

## 2020-03-22 LAB — RPR: RPR Ser Ql: NONREACTIVE

## 2020-03-23 LAB — GC/CHLAMYDIA PROBE AMP
Chlamydia trachomatis, NAA: NEGATIVE
Neisseria Gonorrhoeae by PCR: NEGATIVE

## 2020-03-26 DIAGNOSIS — M722 Plantar fascial fibromatosis: Secondary | ICD-10-CM | POA: Diagnosis not present

## 2020-03-26 DIAGNOSIS — R091 Pleurisy: Secondary | ICD-10-CM | POA: Diagnosis not present

## 2020-03-26 DIAGNOSIS — E661 Drug-induced obesity: Secondary | ICD-10-CM | POA: Diagnosis not present

## 2020-03-28 ENCOUNTER — Telehealth: Payer: Self-pay | Admitting: Family

## 2020-03-28 NOTE — Telephone Encounter (Signed)
Called number provided. No answer, no recording. Line just rang.

## 2020-03-28 NOTE — Telephone Encounter (Signed)
Called number provided again. No answer, no recording.

## 2020-03-28 NOTE — Telephone Encounter (Signed)
   Santiago Glad from [employer] with additional questions regarding  Accession [id number] P What was the source of culture?  Please call [phone]

## 2020-03-29 LAB — HERPES SIMPLEX VIRUS CULTURE

## 2020-03-29 LAB — TIQ- AMBIGUOUS ORDER

## 2020-03-29 NOTE — Telephone Encounter (Signed)
[phone] 

## 2020-03-29 NOTE — Telephone Encounter (Signed)
I called [facility] and provided them with the specimen source, vaginal sore, for the 03/21/20 HSV cx. See 03/21/20 order details.

## 2020-04-06 ENCOUNTER — Other Ambulatory Visit: Payer: Self-pay | Admitting: Internal Medicine

## 2020-04-08 ENCOUNTER — Encounter: Payer: Self-pay | Admitting: Internal Medicine

## 2020-04-10 NOTE — Patient Instructions (Addendum)
  Medications reviewed and updated.  Changes include :   celebrex twice daily and diflucan  Your prescription(s) have been submitted to your pharmacy. Please take as directed and contact our office if you believe you are having problem(s) with the medication(s).    Please followup in 3 months

## 2020-04-10 NOTE — Progress Notes (Signed)
Subjective:    Patient ID: [name], female    DOB: August 17, 1971, 49 y.o.   MRN: [id number]  HPI The patient is here for follow up of their chronic medical problems, including DM, htn, hyperlipidemia, obesity, gerd, anxiety  She is taking all of her medications as prescribed.    She is not exercising regularly.   She is hoping to start working out with her son this week.  She is limited by knee pain for which she takes tylenol.   She went for over a month with no trulicity.  There has also been more celebrations and has been eating things she should not.  Her sugars have been less than 120.    Vulvar itching-she has been experiencing vulvar itching.  She denies any vaginal discharge.  She is concerned that she may have a yeast infection.  Medications and allergies reviewed with patient and updated if appropriate.  Patient Active Problem List   Diagnosis Date Noted  . History of nephrolithiasis 10/13/2019  . Anxiety 02/05/2018  . Peroneal tendinitis of lower leg, right 10/22/2017  . Chronic respiratory failure with hypoxia ([place]) 07/20/2017  . Knee pain, left 02/02/2016  . Cough variant asthma 03/09/2015  . Diabetes type 2, uncontrolled ([place]) 12/09/2012  . Dyslipidemia   . Acute on chronic respiratory failure with hypoxia ([place]) 04/05/2012  . Allergic rhinitis 01/05/2010  . Essential hypertension 09/07/2009  . URINARY INCONTINENCE, URGE 03/24/2009  . CHRONIC RHINITIS 12/23/2008  . Morbid obesity due to excess calories ([place]) 10/10/2007  . INTERSTITIAL LUNG DISEASE 10/10/2007  . GASTROESOPHAGEAL REFLUX DISEASE 10/10/2007    Current Outpatient Medications on File Prior to Visit  Medication Sig Dispense Refill  . albuterol (PROVENTIL HFA;VENTOLIN HFA) 108 (90 Base) MCG/ACT inhaler Inhale 2 puffs into the lungs every 4 (four) hours as needed for wheezing or shortness of breath. 1 Inhaler 0  . APPLE CIDER VINEGAR PO Take 450 mg by mouth daily.    Marland Kitchen atorvastatin (LIPITOR) 20 MG  tablet TAKE ONE TABLET BY MOUTH DAILY (Patient taking differently: Take 20 mg by mouth daily. ) 90 tablet 1  . B COMPLEX VITAMINS SL Place 1 mL under the tongue 4 (four) times a week.    . calcium-vitamin D (OSCAL WITH D) 500-200 MG-UNIT tablet Take 1 tablet by mouth daily.    Marland Kitchen Dextromethorphan-guaiFENesin (MUCINEX DM MAXIMUM STRENGTH) 60-1200 MG TB12 Take 1 tablet by mouth every 12 (twelve) hours as needed (with flutter valve).    . Dulaglutide (TRULICITY) 3 AO/1.3YQ SOPN Inject 3 mg into the skin once a week. 4 pen 5  . ELDERBERRY PO Take 1,000 mg by mouth daily.    . fluticasone (FLONASE) 50 MCG/ACT nasal spray Place 2 sprays into both nostrils daily.     Marland Kitchen JANUMET XR [phone] MG TB24 TAKE ONE TABLET BY MOUTH DAILY 90 tablet 0  . JARDIANCE 25 MG TABS tablet TAKE ONE TABLET BY MOUTH EVERY MORNING WITH OR WITHOUT FOOD 90 tablet 0  . loratadine (CLARITIN) 10 MG tablet Take 10 mg by mouth at bedtime.     Marland Kitchen losartan (COZAAR) 25 MG tablet Take 1/2 tablet daily. (Patient taking differently: Take 12.5 mg by mouth daily. ) 45 tablet 1  . mometasone-formoterol (DULERA) 100-5 MCG/ACT AERO Inhale 1 puff into the lungs 2 (two) times daily.     . montelukast (SINGULAIR) 10 MG tablet Take 1 tablet (10 mg total) by mouth at bedtime. 30 tablet 5  . ONETOUCH ULTRA test  strip USE TO CHECK BLOOD SUGARS TWO TIMES A DAY 100 strip 2  . OXYGEN 2lpm with sleep and 3lpm with exertion    . pantoprazole (PROTONIX) 40 MG tablet TAKE ONE TABLET BY MOUTH TWICE A DAY BEFORE MEALS (Patient taking differently: Take 40 mg by mouth 2 (two) times daily before a meal. ) 120 tablet 1  . predniSONE (DELTASONE) 5 MG tablet Take 1 tablet (5 mg total) by mouth daily. 100 tablet 1  . Respiratory Therapy Supplies (FLUTTER) DEVI Use as directed 1 each 0  . Turmeric 500 MG CAPS Take 500 mg by mouth daily.    . valACYclovir (VALTREX) 1000 MG tablet Take 1 tablet (1,000 mg total) by mouth 2 (two) times daily. 20 tablet 0  . venlafaxine XR  (EFFEXOR-XR) 37.5 MG 24 hr capsule TAKE ONE CAPSULE BY MOUTH DAILY WITH BREAKFAST *NEED OFFICE VISIT FOR MORE REFILLS* (Patient taking differently: Take 37.5 mg by mouth daily with breakfast. ) 90 capsule 1  . VITRON-C 65-125 MG TABS Take 1 tablet by mouth daily.     No current facility-administered medications on file prior to visit.    Past Medical History:  Diagnosis Date  . Allergic rhinitis   . Arthritis    Knees ankles  feet  . Bronchitis   . Chronic rhinitis   . Cough   . Dyslipidemia   . Dysphagia   . Dyspnea    occationally over last 2 years  . GERD (gastroesophageal reflux disease)   . Headache(784.0)   . ILD (interstitial lung disease) ([place])   . Mild depression ([place])   . Morbid obesity ([place])   . Type II or diabetes mellitus, uncontrolled 11/2012 dx   Dx 12/08/12: a1c 10.0  . Unspecified essential hypertension   . Urge incontinence     Past Surgical History:  Procedure Laterality Date  . ABDOMINAL HYSTERECTOMY    . BILATERAL VATS ABLATION  02/15/04  . CESAREAN SECTION    . KNEE ARTHROSCOPY WITH MEDIAL MENISECTOMY Right 04/01/2019   Procedure: KNEE ARTHROSCOPY WITH MEDIAL MENISECTOMY;  Surgeon: Hiram Gash, MD;  [facility];  Service: Orthopedics;  Laterality: Right;  . KNEE ARTHROSCOPY WITH MEDIAL MENISECTOMY Left 01/06/2020   Procedure: KNEE ARTHROSCOPY WITH MEDIAL MENISECTOMY;  Surgeon: Hiram Gash, MD;  [facility];  Service: Orthopedics;  Laterality: Left;  . LUNG BIOPSY    . RIGHT HEART CATH N/A 01/30/2017   Procedure: Right Heart Cath;  Surgeon: Larey Dresser, MD;  Location: [facility];  Service: Cardiovascular;  Laterality: N/A;    Social History   Socioeconomic History  . Marital status: Married    Spouse name: Not on file  . Number of children: 1  . Years of education: Not on file  . Highest education level: Not on file  Occupational History  . 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 Smoking status: Never Smoker  . Smokeless tobacco: Never Used  . Tobacco comment: {  Vaping Use  . Vaping Use: Never used  Substance and Sexual Activity  . Alcohol use: No    Alcohol/week: 0.0 standard drinks  . Drug use: No  . Sexual activity: Never  Other Topics Concern  . Not on file  Social History Narrative  . Not on file   Social Determinants of Health   Financial Resource Strain:   . Difficulty of Paying Living  Expenses:   Food Insecurity:   . Worried About Charity fundraiser in the Last Year:   . Arboriculturist in the Last Year:   Transportation Needs:   . Film/video editor (Medical):   Marland Kitchen Lack of Transportation (Non-Medical):   Physical Activity:   . Days of Exercise per Week:   . Minutes of Exercise per Session:   Stress:   . Feeling of Stress :   Social Connections:   . Frequency of Communication with Friends and Family:   . Frequency of Social Gatherings with Friends and Family:   . Attends Religious Services:   . Active Member of Clubs or Organizations:   . Attends Archivist Meetings:   Marland Kitchen Marital Status:     Family History  Problem Relation Age of Onset  . Sarcoidosis Mother        dxed by transbrochial bx  . Allergies Mother   . Heart disease Father   . Diabetes Father   . Allergies Father   . Coronary artery disease Father   . Cancer Maternal Grandmother        CA of unknown type  . Cancer Paternal Grandmother        CA of unknown type    Review of Systems  Constitutional: Negative for chills and fever.  Respiratory: Negative for cough, shortness of breath and wheezing.   Cardiovascular: Negative for chest pain, palpitations and leg swelling.  Neurological: Negative for light-headedness and headaches.       Objective:   Vitals:   04/11/20 1020  BP: 124/78  Pulse: 86  Temp: 98.1 F (36.7 C)  SpO2: 99%   BP Readings from Last 3 Encounters:  04/11/20 124/78  04/11/20 124/78  03/21/20  116/78   Wt Readings from Last 3 Encounters:  04/11/20 240 lb (108.9 kg)  04/11/20 240 lb (108.9 kg)  03/21/20 242 lb 9.6 oz (110 kg)   Body mass index is 46.87 kg/m.   Physical Exam    Constitutional: Appears well-developed and well-nourished. No distress.  HENT:  Head: Normocephalic and atraumatic.  Neck: Neck supple. No tracheal deviation present. No thyromegaly present.  No cervical lymphadenopathy Cardiovascular: Normal rate, regular rhythm and normal heart sounds.  No murmur heard. No carotid bruit .  No edema Pulmonary/Chest: Effort normal and breath sounds normal. No respiratory distress. No has no wheezes. No rales.  Skin: Skin is warm and dry. Not diaphoretic.  Psychiatric: Normal mood and affect. Behavior is normal.      Assessment & Plan:    See Problem List for Assessment and Plan of chronic medical problems.    This visit occurred during the SARS-CoV-2 public health emergency.  Safety protocols were in place, including screening questions prior to the visit, additional usage of staff PPE, and extensive cleaning of exam room while observing appropriate contact time as indicated for disinfecting solutions.

## 2020-04-11 ENCOUNTER — Other Ambulatory Visit: Payer: Self-pay

## 2020-04-11 ENCOUNTER — Ambulatory Visit (INDEPENDENT_AMBULATORY_CARE_PROVIDER_SITE_OTHER): Payer: Medicare PPO

## 2020-04-11 ENCOUNTER — Encounter: Payer: Self-pay | Admitting: Internal Medicine

## 2020-04-11 ENCOUNTER — Ambulatory Visit: Payer: Medicare PPO | Admitting: Internal Medicine

## 2020-04-11 VITALS — BP 124/78 | HR 86 | Temp 98.1°F | Ht 60.0 in | Wt 240.0 lb

## 2020-04-11 DIAGNOSIS — E785 Hyperlipidemia, unspecified: Secondary | ICD-10-CM | POA: Diagnosis not present

## 2020-04-11 DIAGNOSIS — Z0000 Encounter for general adult medical examination without abnormal findings: Secondary | ICD-10-CM

## 2020-04-11 DIAGNOSIS — E1165 Type 2 diabetes mellitus with hyperglycemia: Secondary | ICD-10-CM | POA: Diagnosis not present

## 2020-04-11 DIAGNOSIS — F419 Anxiety disorder, unspecified: Secondary | ICD-10-CM | POA: Diagnosis not present

## 2020-04-11 DIAGNOSIS — K219 Gastro-esophageal reflux disease without esophagitis: Secondary | ICD-10-CM

## 2020-04-11 DIAGNOSIS — I10 Essential (primary) hypertension: Secondary | ICD-10-CM | POA: Diagnosis not present

## 2020-04-11 DIAGNOSIS — M25562 Pain in left knee: Secondary | ICD-10-CM

## 2020-04-11 DIAGNOSIS — G8929 Other chronic pain: Secondary | ICD-10-CM

## 2020-04-11 DIAGNOSIS — A6000 Herpesviral infection of urogenital system, unspecified: Secondary | ICD-10-CM | POA: Insufficient documentation

## 2020-04-11 MED ORDER — FLUCONAZOLE 150 MG PO TABS: 150.0000 mg | ORAL_TABLET | Freq: Once | ORAL | 0 refills | Status: AC

## 2020-04-11 MED ORDER — CELECOXIB 200 MG PO CAPS: 200.0000 mg | ORAL_CAPSULE | Freq: Two times a day (BID) | ORAL | 5 refills | Status: DC

## 2020-04-11 NOTE — Patient Instructions (Signed)
[name] , Thank you for taking time to come for your Medicare Wellness Visit. I appreciate your ongoing commitment to your health goals. Please review the following plan we discussed and let me know if I can assist you in the future.   Screening recommendations/referrals: Colonoscopy: never done Mammogram: 10/26/2015; due every 1-2 years Bone Density: never done Recommended yearly ophthalmology/optometry visit for glaucoma screening and checkup Recommended yearly dental visit for hygiene and checkup  Vaccinations: Influenza vaccine: 06/09/2019 Pneumococcal vaccine: completed Tdap vaccine: 03/24/2009; overdue (due every 10 years) Shingles vaccine: never done  Covid-19: completed Moderna  Advanced directives: Please bring a copy of your health care power of attorney and living will to the office at your convenience.   Conditions/risks identified: Yes. Please continue to do your personal lifestyle choices by: daily care of teeth and gums, regular physical activity (goal should be 5 days a week for 30 minutes), eat a healthy diet, avoid tobacco and drug use, limiting any alcohol intake, taking a low-dose aspirin (if not allergic or have been advised by your provider otherwise) and taking vitamins and minerals as recommended by your provider. Continue doing brain stimulating activities (puzzles, reading, adult coloring books, staying active) to keep memory sharp. Continue to eat heart healthy diet (full of fruits, vegetables, whole grains, lean protein, water--limit salt, fat, and sugar intake) and increase physical activity as tolerated.  Next appointment: Please schedule your next Medicare Wellness Visit with your Nurse Health Advisor in 1 year.  Preventive Care 40-64 Years, Female Preventive care refers to lifestyle choices and visits with your health care provider that can promote health and wellness. What does preventive care include?    A yearly physical exam. This is also called an  annual well check.  Dental exams once or twice a year.  Routine eye exams. Ask your health care provider how often you should have your eyes checked.  Personal lifestyle choices, including:  Daily care of your teeth and gums.  Regular physical activity.  Eating a healthy diet.  Avoiding tobacco and drug use.  Limiting alcohol use.  Practicing safe sex.  Taking low-dose aspirin daily starting at age 71.  Taking vitamin and mineral supplements as recommended by your health care provider. What happens during an annual well check? The services and screenings done by your health care provider during your annual well check will depend on your age, overall health, lifestyle risk factors, and family history of disease. Counseling  Your health care provider may ask you questions about your:  Alcohol use.  Tobacco use.  Drug use.  Emotional well-being.  Home and relationship well-being.  Sexual activity.  Eating habits.  Work and work Statistician.  Method of birth control.  Menstrual cycle.  Pregnancy history. Screening  You may have the following tests or measurements:  Height, weight, and BMI.  Blood pressure.  Lipid and cholesterol levels. These may be checked every 5 years, or more frequently if you are over 24 years old.  Skin check.  Lung cancer screening. You may have this screening every year starting at age 46 if you have a 30-pack-year history of smoking and currently smoke or have quit within the past 15 years.  Fecal occult blood test (FOBT) of the stool. You may have this test every year starting at age 50.  Flexible sigmoidoscopy or colonoscopy. You may have a sigmoidoscopy every 5 years or a colonoscopy every 10 years starting at age 95.  Hepatitis C blood test.  Hepatitis B  blood test.  Sexually transmitted disease (STD) testing.  Diabetes screening. This is done by checking your blood sugar (glucose) after you have not eaten for a while  (fasting). You may have this done every 1-3 years.  Mammogram. This may be done every 1-2 years. Talk to your health care provider about when you should start having regular mammograms. This may depend on whether you have a family history of breast cancer.  BRCA-related cancer screening. This may be done if you have a family history of breast, ovarian, tubal, or peritoneal cancers.  Pelvic exam and Pap test. This may be done every 3 years starting at age 54. Starting at age 32, this may be done every 5 years if you have a Pap test in combination with an HPV test.  Bone density scan. This is done to screen for osteoporosis. You may have this scan if you are at high risk for osteoporosis. Discuss your test results, treatment options, and if necessary, the need for more tests with your health care provider. Vaccines  Your health care provider may recommend certain vaccines, such as:  Influenza vaccine. This is recommended every year.  Tetanus, diphtheria, and acellular pertussis (Tdap, Td) vaccine. You may need a Td booster every 10 years.  Zoster vaccine. You may need this after age 25.  Pneumococcal 13-valent conjugate (PCV13) vaccine. You may need this if you have certain conditions and were not previously vaccinated.  Pneumococcal polysaccharide (PPSV23) vaccine. You may need one or two doses if you smoke cigarettes or if you have certain conditions. Talk to your health care provider about which screenings and vaccines you need and how often you need them. This information is not intended to replace advice given to you by your health care provider. Make sure you discuss any questions you have with your health care provider. Document Released: 10/14/2015 Document Revised: 06/06/2016 Document Reviewed: 07/19/2015 Elsevier Interactive Patient Education  2017 [employer] Prevention in the Home Falls can cause injuries. They can happen to people of all ages. There are many things  you can do to make your home safe and to help prevent falls. What can I do on the outside of my home?  Regularly fix the edges of walkways and driveways and fix any cracks.  Remove anything that might make you trip as you walk through a door, such as a raised step or threshold.  Trim any bushes or trees on the path to your home.  Use bright outdoor lighting.  Clear any walking paths of anything that might make someone trip, such as rocks or tools.  Regularly check to see if handrails are loose or broken. Make sure that both sides of any steps have handrails.  Any raised decks and porches should have guardrails on the edges.  Have any leaves, snow, or ice cleared regularly.  Use sand or salt on walking paths during winter.  Clean up any spills in your garage right away. This includes oil or grease spills. What can I do in the bathroom?  Use night lights.  Install grab bars by the toilet and in the tub and shower. Do not use towel bars as grab bars.  Use non-skid mats or decals in the tub or shower.  If you need to sit down in the shower, use a plastic, non-slip stool.  Keep the floor dry. Clean up any water that spills on the floor as soon as it happens.  Remove soap buildup in the  tub or shower regularly.  Attach bath mats securely with double-sided non-slip rug tape.  Do not have throw rugs and other things on the floor that can make you trip. What can I do in the bedroom?  Use night lights.  Make sure that you have a light by your bed that is easy to reach.  Do not use any sheets or blankets that are too big for your bed. They should not hang down onto the floor.  Have a firm chair that has side arms. You can use this for support while you get dressed.  Do not have throw rugs and other things on the floor that can make you trip. What can I do in the kitchen?  Clean up any spills right away.  Avoid walking on wet floors.  Keep items that you use a lot in  easy-to-reach places.  If you need to reach something above you, use a strong step stool that has a grab bar.  Keep electrical cords out of the way.  Do not use floor polish or wax that makes floors slippery. If you must use wax, use non-skid floor wax.  Do not have throw rugs and other things on the floor that can make you trip. What can I do with my stairs?  Do not leave any items on the stairs.  Make sure that there are handrails on both sides of the stairs and use them. Fix handrails that are broken or loose. Make sure that handrails are as long as the stairways.  Check any carpeting to make sure that it is firmly attached to the stairs. Fix any carpet that is loose or worn.  Avoid having throw rugs at the top or bottom of the stairs. If you do have throw rugs, attach them to the floor with carpet tape.  Make sure that you have a light switch at the top of the stairs and the bottom of the stairs. If you do not have them, ask someone to add them for you. What else can I do to help prevent falls?  Wear shoes that:  Do not have high heels.  Have rubber bottoms.  Are comfortable and fit you well.  Are closed at the toe. Do not wear sandals.  If you use a stepladder:  Make sure that it is fully opened. Do not climb a closed stepladder.  Make sure that both sides of the stepladder are locked into place.  Ask someone to hold it for you, if possible.  Clearly mark and make sure that you can see:  Any grab bars or handrails.  First and last steps.  Where the edge of each step is.  Use tools that help you move around (mobility aids) if they are needed. These include:  Canes.  Walkers.  Scooters.  Crutches.  Turn on the lights when you go into a dark area. Replace any light bulbs as soon as they burn out.  Set up your furniture so you have a clear path. Avoid moving your furniture around.  If any of your floors are uneven, fix them.  If there are any pets  around you, be aware of where they are.  Review your medicines with your doctor. Some medicines can make you feel dizzy. This can increase your chance of falling. Ask your doctor what other things that you can do to help prevent falls. This information is not intended to replace advice given to you by your health care provider. Make sure you  discuss any questions you have with your health care provider. Document Released: 07/14/2009 Document Revised: 02/23/2016 Document Reviewed: 10/22/2014 Elsevier Interactive Patient Education  2017 [employer].

## 2020-04-11 NOTE — Assessment & Plan Note (Signed)
Chronic BP well controlled Current regimen effective and well tolerated Continue current medications at current doses  

## 2020-04-11 NOTE — Assessment & Plan Note (Signed)
Chronic She does have increased anxiety Hopefully some of that will be improving Continue Effexor

## 2020-04-11 NOTE — Assessment & Plan Note (Signed)
Chronic Continue daily statin Regular exercise and healthy diet encouraged

## 2020-04-11 NOTE — Assessment & Plan Note (Signed)
Chronic Stressed the importance of regular exercise-she plans on starting tomorrow.  She is limited because of her chronic left knee pain Decreased portions, healthy diet Continue Trulicity Follow-up in 3 months

## 2020-04-11 NOTE — Assessment & Plan Note (Signed)
Chronic GERD controlled Continue daily medication  

## 2020-04-11 NOTE — Progress Notes (Signed)
Subjective:   [name] is a 49 y.o. female who presents for Medicare Annual (Subsequent) preventive examination.  Review of Systems    No ROS. Medicare Wellness Visit. Additional risk factors are reflected in social history. Cardiac Risk Factors include: diabetes mellitus;dyslipidemia;family history of premature cardiovascular disease;hypertension;obesity (BMI >30kg/m2)     Objective:    Today's Vitals   04/11/20 1029 04/11/20 1037  BP: 124/78   Pulse: 86   Temp: 98.1 F (36.7 C)   SpO2: 99%   Weight: 240 lb (108.9 kg)   Height: 5' (1.524 m)   PainSc:  4    Body mass index is 46.87 kg/m.  Advanced Directives 04/11/2020 12/29/2019 03/25/2019 11/08/2017 07/08/2017 05/01/2017 01/30/2017  Does Patient Have a Medical Advance Directive? Yes No No No No No No  Type of Paramedic of [place];Living will - - - - - -  Does patient want to make changes to medical advance directive? No - Patient declined - - - - - -  Copy of [place] in Chart? No - copy requested - - - - - -  Would patient like information on creating a medical advance directive? - No - Patient declined No - Patient declined Yes (ED - Information included in AVS) - - No - Patient declined    Current Medications (verified) Outpatient Encounter Medications as of 04/11/2020  Medication Sig  . albuterol (PROVENTIL HFA;VENTOLIN HFA) 108 (90 Base) MCG/ACT inhaler Inhale 2 puffs into the lungs every 4 (four) hours as needed for wheezing or shortness of breath.  . APPLE CIDER VINEGAR PO Take 450 mg by mouth daily.  Marland Kitchen atorvastatin (LIPITOR) 20 MG tablet TAKE ONE TABLET BY MOUTH DAILY (Patient taking differently: Take 20 mg by mouth daily. )  . B COMPLEX VITAMINS SL Place 1 mL under the tongue 4 (four) times a week.  . calcium-vitamin D (OSCAL WITH D) 500-200 MG-UNIT tablet Take 1 tablet by mouth daily.  Marland Kitchen Dextromethorphan-guaiFENesin (MUCINEX DM MAXIMUM STRENGTH) 60-1200 MG TB12 Take 1  tablet by mouth every 12 (twelve) hours as needed (with flutter valve).  . Dulaglutide (TRULICITY) 3 XL/2.4MW SOPN Inject 3 mg into the skin once a week.  Marland Kitchen ELDERBERRY PO Take 1,000 mg by mouth daily.  . fluticasone (FLONASE) 50 MCG/ACT nasal spray Place 2 sprays into both nostrils daily.   Marland Kitchen JANUMET XR [phone] MG TB24 TAKE ONE TABLET BY MOUTH DAILY  . JARDIANCE 25 MG TABS tablet TAKE ONE TABLET BY MOUTH EVERY MORNING WITH OR WITHOUT FOOD  . loratadine (CLARITIN) 10 MG tablet Take 10 mg by mouth at bedtime.   Marland Kitchen losartan (COZAAR) 25 MG tablet Take 1/2 tablet daily. (Patient taking differently: Take 12.5 mg by mouth daily. )  . mometasone-formoterol (DULERA) 100-5 MCG/ACT AERO Inhale 1 puff into the lungs 2 (two) times daily.   . montelukast (SINGULAIR) 10 MG tablet Take 1 tablet (10 mg total) by mouth at bedtime.  Glory Rosebush ULTRA test strip USE TO CHECK BLOOD SUGARS TWO TIMES A DAY  . OXYGEN 2lpm with sleep and 3lpm with exertion  . pantoprazole (PROTONIX) 40 MG tablet TAKE ONE TABLET BY MOUTH TWICE A DAY BEFORE MEALS (Patient taking differently: Take 40 mg by mouth 2 (two) times daily before a meal. )  . predniSONE (DELTASONE) 5 MG tablet Take 1 tablet (5 mg total) by mouth daily.  Marland Kitchen Respiratory Therapy Supplies (FLUTTER) DEVI Use as directed  . Turmeric 500 MG CAPS  Take 500 mg by mouth daily.  . valACYclovir (VALTREX) 1000 MG tablet Take 1 tablet (1,000 mg total) by mouth 2 (two) times daily.  Marland Kitchen venlafaxine XR (EFFEXOR-XR) 37.5 MG 24 hr capsule TAKE ONE CAPSULE BY MOUTH DAILY WITH BREAKFAST *NEED OFFICE VISIT FOR MORE REFILLS* (Patient taking differently: Take 37.5 mg by mouth daily with breakfast. )  . VITRON-C 65-125 MG TABS Take 1 tablet by mouth daily.  . [DISCONTINUED] meloxicam (MOBIC) 15 MG tablet  (Patient not taking: Reported on 04/11/2020)   No facility-administered encounter medications on file as of 04/11/2020.    Allergies (verified) Penicillins, Peach flavor, and Levaquin  [levofloxacin]   History: Past Medical History:  Diagnosis Date  . Allergic rhinitis   . Arthritis    Knees ankles  feet  . Bronchitis   . Chronic rhinitis   . Cough   . Dyslipidemia   . Dysphagia   . Dyspnea    occationally over last 2 years  . GERD (gastroesophageal reflux disease)   . Headache(784.0)   . ILD (interstitial lung disease) ([place])   . Mild depression ([place])   . Morbid obesity ([place])   . Type II or diabetes mellitus, uncontrolled 11/2012 dx   Dx 12/08/12: a1c 10.0  . Unspecified essential hypertension   . Urge incontinence    Past Surgical History:  Procedure Laterality Date  . ABDOMINAL HYSTERECTOMY    . BILATERAL VATS ABLATION  02/15/04  . CESAREAN SECTION    . KNEE ARTHROSCOPY WITH MEDIAL MENISECTOMY Right 04/01/2019   Procedure: KNEE ARTHROSCOPY WITH MEDIAL MENISECTOMY;  Surgeon: Hiram Gash, MD;  Location: [facility];  Service: Orthopedics;  Laterality: Right;  . KNEE ARTHROSCOPY WITH MEDIAL MENISECTOMY Left 01/06/2020   Procedure: KNEE ARTHROSCOPY WITH MEDIAL MENISECTOMY;  Surgeon: Hiram Gash, MD;  Location: [facility];  Service: Orthopedics;  Laterality: Left;  . LUNG BIOPSY    . RIGHT HEART CATH N/A 01/30/2017   Procedure: Right Heart Cath;  Surgeon: Larey Dresser, MD;  Location: [facility];  Service: Cardiovascular;  Laterality: N/A;   Family History  Problem Relation Age of Onset  . Sarcoidosis Mother        dxed by transbrochial bx  . Allergies Mother   . Heart disease Father   . Diabetes Father   . Allergies Father   . Coronary artery disease Father   . Cancer Maternal Grandmother        CA of unknown type  . Cancer Paternal Grandmother        CA of unknown type   Social History   Socioeconomic History  . Marital status: Married    Spouse name: Not on file  . Number of children: 1  . Years of education: Not on file  . Highest education level: Not on file  Occupational History  . 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 Smoking status: Never Smoker  . Smokeless tobacco: Never Used  . Tobacco comment: {  Vaping Use  . Vaping Use: Never used  Substance and Sexual Activity  . Alcohol use: No    Alcohol/week: 0.0 standard drinks  . Drug use: No  . Sexual activity: Never  Other Topics Concern  . Not on file  Social History Narrative  . Not on file   Social Determinants of Health   Financial Resource Strain: Low Risk   . Difficulty of  Paying Living Expenses: Not hard at all  Food Insecurity: No Food Insecurity  . Worried About Charity fundraiser in the Last Year: Never true  . Ran Out of Food in the Last Year: Never true  Transportation Needs: No Transportation Needs  . Lack of Transportation (Medical): No  . Lack of Transportation (Non-Medical): No  Physical Activity:   . Days of Exercise per Week:   . Minutes of Exercise per Session:   Stress: No Stress Concern Present  . Feeling of Stress : Not at all  Social Connections: Socially Integrated  . Frequency of Communication with Friends and Family: More than three times a week  . Frequency of Social Gatherings with Friends and Family: Once a week  . Attends Religious Services: More than 4 times per year  . Active Member of Clubs or Organizations: Yes  . Attends Archivist Meetings: More than 4 times per year  . Marital Status: Married    Tobacco Counseling Counseling given: Not Answered Comment: {   Clinical Intake:  Pre-visit preparation completed: Yes  Pain : 0-10 Pain Score: 4  Pain Location: Knee Pain Orientation: Left Pain Onset: 1 to 4 weeks ago Pain Frequency: Intermittent     BMI - recorded: 46.87 Nutritional Status: BMI > 30  Obese Nutritional Risks: None Diabetes: Yes CBG done?: No Did pt. bring in CBG monitor from home?: No  How often do you need to have someone help you when you read instructions, pamphlets, or other written materials from your  doctor or pharmacy?: 1 - Never What is the last grade level you completed in school?: Associates Degree  Diabetic? yes  Interpreter Needed?: No  Information entered by :: Dailee Manalang N> Rylee Nuzum, LPN   Activities of Daily Living In your present state of health, do you have any difficulty performing the following activities: 04/11/2020 12/29/2019  Hearing? N N  Vision? N N  Difficulty concentrating or making decisions? N N  Walking or climbing stairs? N N  Dressing or bathing? N N  Doing errands, shopping? N N  Preparing Food and eating ? N -  Using the Toilet? N -  In the past six months, have you accidently leaked urine? N -  Do you have problems with loss of bowel control? N -  Managing your Medications? N -  Managing your Finances? N -  Housekeeping or managing your Housekeeping? N -  Some recent data might be hidden    Patient Care Team: Binnie Rail, MD as PCP - General (Internal Medicine) Tanda Rockers, MD (Pulmonary Disease)  Indicate any recent Medical Services you may have received from other than [facility] providers in the past year (date may be approximate).     Assessment:   This is a routine wellness examination for [name].  Hearing/Vision screen No exam data present  Dietary issues and exercise activities discussed: Current Exercise Habits: The patient does not participate in regular exercise at present, Exercise limited by: orthopedic condition(s) (left knee pain)  Goals    .  Patient Stated      Increase the amount of exercise by trying to get to the gym by 6:00 AM routinely 3 days a week. Staying active at home doing spurts of exercises.    .  Patient Stated (pt-stated)      My goal is to lose 10 pounds within the next 3 months.      Depression Screen PHQ 2/9 Scores 04/11/2020 11/08/2017  PHQ - 2  Score 0 1  PHQ- 9 Score - 4    Fall Risk Fall Risk  04/11/2020 11/08/2017  Falls in the past year? 0 Yes  Number falls in past yr: 0 1  Injury with Fall? 0 No   Risk for fall due to : No Fall Risks -  Follow up Falls evaluation completed Falls prevention discussed    Any stairs in or around the home? No  If so, are there any without handrails? No  Home free of loose throw rugs in walkways, pet beds, electrical cords, etc? Yes  Adequate lighting in your home to reduce risk of falls? Yes   ASSISTIVE DEVICES UTILIZED TO PREVENT FALLS:  Life alert? No  Use of a cane, walker or w/c? No  Grab bars in the bathroom? Yes  Shower chair or bench in shower? No  Elevated toilet seat or a handicapped toilet? No   TIMED UP AND GO:  Was the test performed? No .  Length of time to ambulate 10 feet: 0 sec.   Gait steady and fast without use of assistive device  Cognitive Function:        Immunizations Immunization History  Administered Date(s) Administered  . Influenza Split 06/21/2011, 07/29/2012  . Influenza Whole 07/22/2009  . Influenza,inj,Quad PF,6+ Mos 06/15/2013, 08/19/2014, 09/19/2015, 08/31/2016, 08/09/2017, 09/03/2018, 06/09/2019  . Moderna SARS-COVID-2 Vaccination 10/24/2019, 11/28/2019  . Pneumococcal Conjugate-13 09/19/2015  . Pneumococcal Polysaccharide-23 07/22/2009  . Td 03/24/2009    TDAP status: Due, Education has been provided regarding the importance of this vaccine. Advised may receive this vaccine at local pharmacy or Health Dept. Aware to provide a copy of the vaccination record if obtained from local pharmacy or Health Dept. Verbalized acceptance and understanding. Flu Vaccine status: Up to date Pneumococcal vaccine status: Up to date Covid-19 vaccine status: Completed vaccines  Qualifies for Shingles Vaccine? No   Zostavax completed No   Shingrix Completed?: No.    Education has been provided regarding the importance of this vaccine. Patient has been advised to call insurance company to determine out of pocket expense if they have not yet received this vaccine. Advised may also receive vaccine at local pharmacy or  Health Dept. Verbalized acceptance and understanding.  Screening Tests Health Maintenance  Topic Date Due  . Hepatitis C Screening  Never done  . OPHTHALMOLOGY EXAM  10/01/2018  . FOOT EXAM  05/13/2019  . TETANUS/TDAP  10/12/2020 (Originally 03/25/2019)  . INFLUENZA VACCINE  05/01/2020  . HEMOGLOBIN A1C  09/20/2020  . PNEUMOCOCCAL POLYSACCHARIDE VACCINE AGE 59-64 HIGH RISK  Completed  . COVID-19 Vaccine  Completed  . HIV Screening  Completed    Health Maintenance  Health Maintenance Due  Topic Date Due  . Hepatitis C Screening  Never done  . OPHTHALMOLOGY EXAM  10/01/2018  . FOOT EXAM  05/13/2019    Colorectal cancer screening: Referral to GI placed at age 22. Pt aware the office will call re: appt. Mammogram status: Completed 10/26/2015. Repeat every year Bone Density status: Ordered no. Pt provided with contact info and advised to call to schedule appt.  Lung Cancer Screening: (Low Dose CT Chest recommended if Age 41-80 years, 30 pack-year currently smoking OR have quit w/in 15years.) does not qualify.   Lung Cancer Screening Referral: no  Additional Screening:  Hepatitis C Screening: does not qualify; Completed no  Vision Screening: Recommended annual ophthalmology exams for early detection of glaucoma and other disorders of the eye. Is the patient up to date with their  annual eye exam?  Yes  Who is the provider or what is the name of the office in which the patient attends annual eye exams? [facility] Comment, OD at [place] If pt is not established with a provider, would they like to be referred to a provider to establish care? No .   Dental Screening: Recommended annual dental exams for proper oral hygiene  Community Resource Referral / Chronic Care Management: CRR required this visit?  No   CCM required this visit?  No      Plan:     I have personally reviewed and noted the following in the patient's chart:   . Medical and social history . Use of  alcohol, tobacco or illicit drugs  . Current medications and supplements . Functional ability and status . Nutritional status . Physical activity . Advanced directives . List of other physicians . Hospitalizations, surgeries, and ER visits in previous 12 months . Vitals . Screenings to include cognitive, depression, and falls . Referrals and appointments  In addition, I have reviewed and discussed with patient certain preventive protocols, quality metrics, and best practice recommendations. A written personalized care plan for preventive services as well as general preventive health recommendations were provided to patient.     Sheral Flow, LPN   [id number]   Nurse Notes:  Patient is cogitatively intact.

## 2020-04-11 NOTE — Assessment & Plan Note (Signed)
Chronic Has seen sports medicine and orthopedics Pain is not controlled and this is limiting her physically She has taken meloxicam and Celebrex in the past and they have helped Start Celebrex twice daily as needed We will start exercising as much as possible and work on weight loss

## 2020-04-11 NOTE — Assessment & Plan Note (Signed)
Chronic Sugars not ideally controlled She was off Trulicity for about a month, but is now back on Continue current medications Start regular exercise Stressed weight loss Follow-up in 3 months with blood work at that time She plans on losing 5-10 pounds in the next 3 months

## 2020-04-18 ENCOUNTER — Other Ambulatory Visit: Payer: Self-pay | Admitting: Internal Medicine

## 2020-04-25 DIAGNOSIS — M722 Plantar fascial fibromatosis: Secondary | ICD-10-CM | POA: Diagnosis not present

## 2020-04-25 DIAGNOSIS — R091 Pleurisy: Secondary | ICD-10-CM | POA: Diagnosis not present

## 2020-04-25 DIAGNOSIS — E661 Drug-induced obesity: Secondary | ICD-10-CM | POA: Diagnosis not present

## 2020-04-29 ENCOUNTER — Ambulatory Visit
Admission: RE | Admit: 2020-04-29 | Discharge: 2020-04-29 | Disposition: A | Discharge status: Home / Self Care | Payer: Medicare PPO | Source: Ambulatory Visit | Attending: Internal Medicine | Admitting: Internal Medicine

## 2020-04-29 ENCOUNTER — Other Ambulatory Visit: Payer: Self-pay

## 2020-04-29 VITALS — BP 140/84 | HR 75 | Temp 97.8°F | Resp 18

## 2020-04-29 DIAGNOSIS — J040 Acute laryngitis: Secondary | ICD-10-CM

## 2020-04-29 NOTE — ED Triage Notes (Signed)
Pt c/o irritation to throat off and on x2wks and has progressed. States her voice is in and out. States hasn't been on her acid reflux meds for over a year.

## 2020-04-29 NOTE — ED Provider Notes (Signed)
[name]    CSN: [id number] Arrival date & time: 04/29/20  1500      History   Chief Complaint Chief Complaint  Patient presents with  . Sore Throat    HPI Abagayle H Mikelson is a 49 y.o. female with history of obesity, type 2 diabetes, GERD presenting for laryngitis x2 weeks. States she did not realize that she should be taking her reflux medications daily to help prevent esophageal damage. Denies nausea, vomiting, abdominal pain, change in bowel or bladder habit, fever. No throat swelling, difficulty breathing or swallowing. Recently started her Protonix again (Tuesday).    Past Medical History:  Diagnosis Date  . Allergic rhinitis   . Arthritis    Knees ankles  feet  . Bronchitis   . Chronic rhinitis   . Cough   . Dyslipidemia   . Dysphagia   . Dyspnea    occationally over last 2 years  . GERD (gastroesophageal reflux disease)   . Headache(784.0)   . ILD (interstitial lung disease) ([place])   . Mild depression ([place])   . Morbid obesity ([place])   . Type II or diabetes mellitus, uncontrolled 11/2012 dx   Dx 12/08/12: a1c 10.0  . Unspecified essential hypertension   . Urge incontinence     Patient Active Problem List   Diagnosis Date Noted  . Genital herpes 04/11/2020  . History of nephrolithiasis 10/13/2019  . Anxiety 02/05/2018  . Peroneal tendinitis of lower leg, right 10/22/2017  . Chronic respiratory failure with hypoxia ([place]) 07/20/2017  . Knee pain, left 02/02/2016  . Cough variant asthma 03/09/2015  . Diabetes type 2, uncontrolled ([place]) 12/09/2012  . Dyslipidemia   . Acute on chronic respiratory failure with hypoxia ([place]) 04/05/2012  . Allergic rhinitis 01/05/2010  . Essential hypertension 09/07/2009  . URINARY INCONTINENCE, URGE 03/24/2009  . CHRONIC RHINITIS 12/23/2008  . Morbid obesity due to excess calories ([place]) 10/10/2007  . INTERSTITIAL LUNG DISEASE 10/10/2007  . GASTROESOPHAGEAL REFLUX DISEASE 10/10/2007    Past Surgical History:   Procedure Laterality Date  . ABDOMINAL HYSTERECTOMY    . BILATERAL VATS ABLATION  02/15/04  . CESAREAN SECTION    . KNEE ARTHROSCOPY WITH MEDIAL MENISECTOMY Right 04/01/2019   Procedure: KNEE ARTHROSCOPY WITH MEDIAL MENISECTOMY;  Surgeon: Hiram Gash, MD;  [facility];  Service: Orthopedics;  Laterality: Right;  . KNEE ARTHROSCOPY WITH MEDIAL MENISECTOMY Left 01/06/2020   Procedure: KNEE ARTHROSCOPY WITH MEDIAL MENISECTOMY;  Surgeon: Hiram Gash, MD;  [facility];  Service: Orthopedics;  Laterality: Left;  . LUNG BIOPSY    . RIGHT HEART CATH N/A 01/30/2017   Procedure: Right Heart Cath;  Surgeon: Larey Dresser, MD;  Location: [facility];  Service: Cardiovascular;  Laterality: N/A;    OB History   No obstetric history on file.      Home Medications    Prior to Admission medications   Medication Sig Start Date End Date Taking? Authorizing Provider  albuterol (PROVENTIL HFA;VENTOLIN HFA) 108 (90 Base) MCG/ACT inhaler Inhale 2 puffs into the lungs every 4 (four) hours as needed for wheezing or shortness of breath. 07/08/17   Janne Napoleon, NP  APPLE CIDER VINEGAR PO Take 450 mg by mouth daily.    [provider]  atorvastatin (LIPITOR) 20 MG tablet TAKE ONE TABLET BY MOUTH DAILY Patient taking differently: Take 20 mg by mouth daily.  09/18/19   Binnie Rail, MD  B COMPLEX VITAMINS SL Place 1 mL under  the tongue 4 (four) times a week.    [provider]  calcium-vitamin D (OSCAL WITH D) 500-200 MG-UNIT tablet Take 1 tablet by mouth daily.    [provider]  celecoxib (CELEBREX) 200 MG capsule Take 1 capsule (200 mg total) by mouth 2 (two) times daily. 04/11/20   Binnie Rail, MD  Dextromethorphan-guaiFENesin (MUCINEX DM MAXIMUM STRENGTH) 60-1200 MG TB12 Take 1 tablet by mouth every 12 (twelve) hours as needed (with flutter valve).    [provider]  Dulaglutide (TRULICITY) 3 XK/4.8JE SOPN Inject 3 mg into the skin once a week. 10/14/19    Binnie Rail, MD  ELDERBERRY PO Take 1,000 mg by mouth daily.    [provider]  fluticasone (FLONASE) 50 MCG/ACT nasal spray Place 2 sprays into both nostrils daily.     [provider]  JANUMET XR [phone] MG TB24 TAKE ONE TABLET BY MOUTH DAILY 04/07/20   Burns, Claudina Lick, MD  JARDIANCE 25 MG TABS tablet TAKE ONE TABLET BY MOUTH EVERY MORNING WITH OR WITHOUT FOOD 04/07/20   Binnie Rail, MD  loratadine (CLARITIN) 10 MG tablet Take 10 mg by mouth at bedtime.     [provider]  losartan (COZAAR) 25 MG tablet Take 1/2 tablet daily. Patient taking differently: Take 12.5 mg by mouth daily.  12/14/19   Burns, Claudina Lick, MD  mometasone-formoterol (DULERA) 100-5 MCG/ACT AERO Inhale 1 puff into the lungs 2 (two) times daily.     [provider]  montelukast (SINGULAIR) 10 MG tablet Take 1 tablet (10 mg total) by mouth at bedtime. 02/02/16   Binnie Rail, MD  ONETOUCH ULTRA test strip USE TO CHECK BLOOD SUGARS TWO TIMES A DAY 09/18/19   Burns, Claudina Lick, MD  OXYGEN 2lpm with sleep and 3lpm with exertion    [provider]  pantoprazole (PROTONIX) 40 MG tablet TAKE ONE TABLET BY MOUTH TWICE A DAY BEFORE MEALS Patient taking differently: Take 40 mg by mouth 2 (two) times daily before a meal.  11/13/19   Burns, Claudina Lick, MD  predniSONE (DELTASONE) 5 MG tablet Take 1 tablet (5 mg total) by mouth daily. 12/08/19   Tanda Rockers, MD  Respiratory Therapy Supplies (FLUTTER) DEVI Use as directed 07/12/17   Tanda Rockers, MD  Turmeric 500 MG CAPS Take 500 mg by mouth daily.    [provider]  venlafaxine XR (EFFEXOR-XR) 37.5 MG 24 hr capsule Take 1 capsule (37.5 mg total) by mouth daily with breakfast. 04/18/20   Marrian Salvage, FNP  VITRON-C 65-125 MG TABS Take 1 tablet by mouth daily.    [provider]    Family History Family History  Problem Relation Age of Onset  . Sarcoidosis Mother        dxed by transbrochial bx  . Allergies Mother    . Heart disease Father   . Diabetes Father   . Allergies Father   . Coronary artery disease Father   . Cancer Maternal Grandmother        CA of unknown type  . Cancer Paternal Grandmother        CA of unknown type    Social History Social History   Tobacco Use  . Smoking status: Never Smoker  . Smokeless tobacco: Never Used  . Tobacco comment: {  Vaping Use  . Vaping Use: Never used  Substance Use Topics  . Alcohol use: No    Alcohol/week: 0.0 standard drinks  .  Drug use: No     Allergies   Penicillins, Peach flavor, and Levaquin [levofloxacin]   Review of Systems Review of Systems   Physical Exam Triage Vital Signs ED Triage Vitals  Enc Vitals Group     BP      Pulse      Resp      Temp      Temp src      SpO2      Weight      Height      Head Circumference      Peak Flow      Pain Score      Pain Loc      Pain Edu?      Excl. in [place]?    No data found.  Updated Vital Signs BP (!) 140/84 (BP Location: Left Arm)   Pulse 75   Temp 97.8 F (36.6 C) (Oral)   Resp 18   SpO2 98%   Visual Acuity Right Eye Distance:   Left Eye Distance:   Bilateral Distance:    Right Eye Near:   Left Eye Near:    Bilateral Near:     Physical Exam Constitutional:      General: She is not in acute distress. HENT:     Head: Normocephalic and atraumatic.     Mouth/Throat:     Mouth: Mucous membranes are moist.     Pharynx: Oropharynx is clear.  Eyes:     General: No scleral icterus.    Conjunctiva/sclera: Conjunctivae normal.     Pupils: Pupils are equal, round, and reactive to light.  Cardiovascular:     Rate and Rhythm: Normal rate.  Pulmonary:     Effort: Pulmonary effort is normal.  Musculoskeletal:     Cervical back: Neck supple. No tenderness.  Lymphadenopathy:     Cervical: No cervical adenopathy.  Skin:    Coloration: Skin is not jaundiced or pale.  Neurological:     Mental Status: She is alert and oriented to person, place, and time.       UC Treatments / Results  Labs (all labs ordered are listed, but only abnormal results are displayed) Labs Reviewed - No data to display  EKG   Radiology No results found.  Procedures Procedures (including critical care time)  Medications Ordered in UC Medications - No data to display  Initial Impression / Assessment and Plan / UC Course  I have reviewed the triage vital signs and the nursing notes.  Pertinent labs & imaging results that were available during my care of the patient were reviewed by me and considered in my medical decision making (see chart for details).     Patient appears well in office today. Likely laryngitis second to GERD. Given chronicity of issue, stress importance of voice rest, supportive care, GERD medication compliance as well as ENT follow-up.  Return precautions discussed, pt verbalized understanding and is agreeable to plan. Final Clinical Impressions(s) / UC Diagnoses   Final diagnoses:  Laryngitis     Discharge Instructions     Take reflux medication once daily. Please take time to review food suggestion packet attached. Important follow-up with primary care/GI for further evaluation and management. Go to ER for vomiting, black stools, blood in stools, severe abdominal pain, fever.    ED Prescriptions    None     PDMP not reviewed this encounter.   [place], [place], [place] 04/29/20 1538

## 2020-04-29 NOTE — Discharge Instructions (Addendum)
Take reflux medication once daily. Please take time to review food suggestion packet attached. Important follow-up with primary care/GI for further evaluation and management. Go to ER for vomiting, black stools, blood in stools, severe abdominal pain, fever.

## 2020-05-09 ENCOUNTER — Other Ambulatory Visit: Payer: Self-pay | Admitting: Internal Medicine

## 2020-05-26 DIAGNOSIS — R091 Pleurisy: Secondary | ICD-10-CM | POA: Diagnosis not present

## 2020-05-26 DIAGNOSIS — E661 Drug-induced obesity: Secondary | ICD-10-CM | POA: Diagnosis not present

## 2020-05-26 DIAGNOSIS — M722 Plantar fascial fibromatosis: Secondary | ICD-10-CM | POA: Diagnosis not present

## 2020-06-09 ENCOUNTER — Ambulatory Visit: Payer: Medicare PPO | Admitting: Internal Medicine

## 2020-06-10 ENCOUNTER — Other Ambulatory Visit: Payer: Self-pay | Admitting: Internal Medicine

## 2020-06-16 ENCOUNTER — Encounter: Payer: Self-pay | Admitting: Internal Medicine

## 2020-06-16 ENCOUNTER — Other Ambulatory Visit: Payer: Self-pay

## 2020-06-16 ENCOUNTER — Ambulatory Visit: Payer: Medicare PPO | Admitting: Internal Medicine

## 2020-06-16 DIAGNOSIS — J9611 Chronic respiratory failure with hypoxia: Secondary | ICD-10-CM | POA: Diagnosis not present

## 2020-06-16 DIAGNOSIS — J45991 Cough variant asthma: Secondary | ICD-10-CM | POA: Diagnosis not present

## 2020-06-16 DIAGNOSIS — J8410 Pulmonary fibrosis, unspecified: Secondary | ICD-10-CM

## 2020-06-16 NOTE — Assessment & Plan Note (Signed)
Body mass index is 48.2 kg/m.  -  Trending up again  Lab Results  Component Value Date   TSH 1.25 05/09/2017     Contributing to gerd risk/ doe/reviewed the need and the process to achieve and maintain neg calorie balance > defer f/u primary care including intermittently monitoring thyroid status     Medical decision making was a moderate level of complexity in this case because of  two chronic conditions /diagnoses requiring extra time for  H and P, chart review, counseling,  and generating customized AVS unique to this office visit and charting.   Each maintenance medication was reviewed in detail including emphasizing most importantly the difference between maintenance and prns and under what circumstances the prns are to be triggered using an action plan format where appropriate. Please see avs for details which were reviewed in writing by both me and my nurse and patient given a written copy highlighted where appropriate with yellow highlighter for the patient's continued care at home along with an updated version of their medications.  Patient was asked to maintain medication reconciliation by comparing this list to the actual medications being used at home and to contact this office right away if there is a conflict or discrepancy.

## 2020-06-16 NOTE — Patient Instructions (Signed)
Weight control is simply a matter of calorie balance which needs to be tilted in your favor by eating less and exercising more.  To get the most out of exercise, you need to be continuously aware that you are short of breath, but never out of breath, for 30 minutes daily. As you improve, it will actually be easier for you to do the same amount of exercise  in  30 minutes so always push to the level where you are short of breath.   See calendar for specific medication instructions and bring it back for each and every office visit for every healthcare provider you see.  Without it,  you may not receive the best quality medical care that we feel you deserve.  You will note that the calendar groups together  your maintenance  medications that are timed at particular times of the day.  Think of this as your checklist for what your doctor has instructed you to do until your next evaluation to see what benefit  there is  to staying on a consistent group of medications intended to keep you well.  The other group at the bottom is entirely up to you to use as you see fit  for specific symptoms that may arise between visits that require you to treat them on an as needed basis.  Think of this as your action plan or "what if" list.   Separating the top medications from the bottom group is fundamental to providing you adequate care going forward.     Please schedule a follow up visit in 6  months but call sooner if needed

## 2020-06-16 NOTE — Progress Notes (Signed)
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  Since then waxing/waning sx requiring intermittent prednisone tapers with only minimal improvement - most of her symptoms chronically have been related to obesity with dyspnea on exertion and tendency to chronic cough which is felt to be at least partly  reflux related but improved with saba 03/09/2015 so started on dulera 100 2bid in addition to maint pred and seems to have helped but not typically  able to get < 10 mg pred s flare cough/sob     History of Present Illness  November 07, 2010 ov cc cough/ strangling  a bit more with taper off reglan (actually worse before ran out of the low dose reglan)  no excess mucus or increase sob on prednisone @  10 mg daily .  Using med calendar well rec 1)  Ok to resume water aerobics but pace yourself 2)  Wait until your swallowing evaulation is complete before making a decision re reglan > stopped it on her own.  3)  Try Prednisone 10 mg one-half each am   04/04/2012 f/u ov/[name] no longer using med calendar on Pred 10 mg one daily  added hyzaar to rx for hbp/leg swelling not back to mall walking yet,  No unusual cough, purulent sputum or sinus/hb symptoms on present rx. No variability to symptoms or need for inhaler. Rec Try prednisone 10 mg one half daily to see if affects your ability to walk the mall on 02 or the cough gets worse (walk the mall first for at least before you try) Always walk for exercise on 3lpm - this will help you burn fat and get into negative calorie balance to lose weight.   03/17/2013 f/u ov/[name] re NSIP on floor of 10 mg per day Chief Complaint  Patient presents with  . Follow-up    Breathing is overall doing well and she denies any new co's today.   ex on a treadmill x 36mx 3 x daily on 2lpm not the 3lpm as rec >>Prednisone  ceiling is 20 mg per day and floor is 10 mg per day       02/06/2016  f/u ov/[name] re: NSIP / 3lpm with ex/ duler 100 2bid and pred 10 mg daily  Chief Complaint  Patient presents with  . Follow-up    Pt states that she did have some issues with allergies but is improving. Pt also was having issues with Dulera, jittery after use, but has started rinsing her mouth after use and side effects have improved. Pt denies cough/wheeze/SOB/CP/tightness.   limited by L knee from doing treadmill, has trainer but not losing wt  Rhinitis symptoms better on singulair   rec Work on inhaler technique:    Should be able to tolerate dulera 100 Take 2 puffs first thing in am and then another 2 puffs about 12 hours later.  If so, try prednisone 10 - 5  = even days take a half, odd days take half   06/01/2016  f/u ov/[name] re: NSIP  Chief Complaint  Patient presents with  . Follow-up    Breathing is doing well. She denies any new co's today.   when ex 3lpm and back on treadmill x 25 min more limited by knee 2-3x per week  at 7mh flat / minimal dry daytime cough on nexium 20 mg bid ac  rec Work on inhaler technique:   Continue  dulera 100 Take 2 puffs first thing in am and then another 2 puffs about 12 hours later.  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 Follow-up    Increased cough x 2 wks  on treadmill x 3lpm x 25 min  Decreased pred Nov 13th and cough worse starting on 23rd  Cough worse at hs and early in am but non-productive  Nose feels dried out/ clogged up/ saline helps  rec Stay on singulair each evening Try dulera 200 Take 2 puffs first thing in am and then another 2 puffs about 12 hours later to see if helps or not by the time you return  Protonix 40 mg Take 30- 60 min before your first and last meals of the day  Prednisone 10 mg  daily until better then 10 alternating with 5 mg daily     12/04/2018  f/u ov/[name] re: NSIP maint on pred 5 mg daily  Chief Complaint  Patient presents with  . Follow-up    PFT done today. Breathing is unchanged. She has not had to use her rescue inhaler.   Dyspnea:  Able to do church steps s any 02  which is an improvement  Cough reproducible always with deep breath/ dry  Sleeping: bed flat/ propped up to 30 degrees with pillows SABA use: none 02: 2lpm at hs /  3lpm ex and sats 98% per pt  rec No change    Knee surgery 04/01/2019 Right knee arthroscopic partial medial meniscectomy and medial femoral and trochlear chondroplasty     06/09/2019  f/u ov/[name] re: NSIP/ cough variant asthma on pred 5 mg daily and dulera 10 1 each am  Chief Complaint  Patient presents with  . Follow-up    Breathing is overall doing well. She has occ nasal congestion and cough in the am- relates to allergies. She rarely uses her albuterol.   Dyspnea:  Walking outdoor track total of two laps on 3lpm sats with sats 93-95% knee slows her down 1st  Cough: minimal / dry  Sleeping: 45 degrees with pillows / 2lpm   SABA use: rarely  02: as above rec No change rx   12/08/2019  f/u ov/[name] re: NSIP/ cough variant asthma on duler 100 one twice daily / pred 5 / needs clearance for knee scope [facility] 11/28/19   Chief Complaint  Patient presents with  . Follow-up    Breathing is overall doing well. She has minimal dry cough, sneezing and runny nose- relates to allergies.   Dyspnea:  More limited by L knee torn miniscus for surgery varkey Cough: minimal assoc with pnds/ better on clariton/worse in am Sleeping: 45 degrees  SABA use: proair one day prior but rarely needing  02: 2lpm and not needing daytime rec Call if you want something stronger for your nasal symptoms = dymista one twice (instead of flonase) You are cleared for knee surgery  Make sure you check your oxygen saturations at highest level of  activity      06/16/2020  f/u ov/[name] re: NSIP on pred 5 mg daily with ? Cough variant asthma maint on dulera 100 2bid Chief Complaint  Patient presents with  . Follow-up    ILD, doing well, no issues   Dyspnea:  Knees are better, doing wee step ex x 15 min on 3lpm 02 98% Cough: minimal assoc with pnds/ flonase  Sleeping: flat bed and pillows to 45  SABA use: none   02: 2lpm hs and 3lpm with ex    No obvious day to day or daytime variability or assoc excess/ purulent sputum or mucus plugs or hemoptysis or cp or chest tightness, subjective wheeze or overt sinus or hb symptoms.   Sleeping as above without nocturnal  or early am exacerbation  of respiratory  c/o's or need for noct saba. Also denies any obvious fluctuation of symptoms with weather or environmental changes or other aggravating or alleviating factors except as outlined above   No unusual exposure hx or h/o childhood pna/ asthma or knowledge of premature birth.  Current Allergies, Complete Past Medical History, Past Surgical History, Family History, and Social History were reviewed in [employer] record.  ROS  The following are not active complaints unless bolded Hoarseness, sore throat, dysphagia, dental problems, itching, sneezing,  nasal congestion or discharge of excess mucus or purulent secretions, ear ache,   fever, chills, sweats, unintended wt loss or wt gain, classically pleuritic or exertional cp,  orthopnea pnd or arm/hand swelling  or leg swelling, presyncope, palpitations, abdominal pain, anorexia, nausea, vomiting, diarrhea  or change in bowel habits or change in bladder habits, change in stools or change in urine, dysuria, hematuria,  rash, arthralgias, visual complaints, headache, numbness, weakness or ataxia or problems with walking or coordination,  change in mood or  memory.        Current Meds  Medication Sig  . albuterol (PROVENTIL HFA;VENTOLIN HFA) 108 (90 Base) MCG/ACT inhaler  Inhale 2 puffs into the lungs every 4 (four) hours as needed for wheezing or shortness of breath.  . APPLE CIDER VINEGAR PO Take 450 mg by mouth daily.  Marland Kitchen atorvastatin (LIPITOR) 20 MG tablet TAKE ONE TABLET BY MOUTH DAILY  . B COMPLEX VITAMINS SL Place 1 mL under the tongue 4 (four) times a week.  . calcium-vitamin D (OSCAL WITH D) 500-200 MG-UNIT tablet Take 1 tablet by mouth daily.  . celecoxib (CELEBREX) 200 MG capsule Take 1 capsule (200 mg total) by mouth 2 (two) times daily.  Marland Kitchen Dextromethorphan-guaiFENesin (MUCINEX DM MAXIMUM STRENGTH) 60-1200 MG TB12 Take 1 tablet by mouth every 12 (twelve) hours as needed (with flutter valve).  . Dulaglutide (TRULICITY) 3 NI/6.2VO SOPN Inject 3 mg into the skin once a week.  Marland Kitchen ELDERBERRY PO Take 1,000 mg by mouth daily.  . fluticasone (FLONASE) 50 MCG/ACT nasal spray Place 2 sprays into both nostrils daily.   Marland Kitchen JANUMET XR [id number] MG TB24 TAKE ONE TABLET BY MOUTH DAILY  . JARDIANCE 25 MG TABS tablet TAKE ONE TABLET BY MOUTH EVERY MORNING WITH OR WITHOUT FOOD  . loratadine (CLARITIN) 10 MG tablet Take 10 mg by mouth at bedtime.   Marland Kitchen losartan (COZAAR) 25 MG tablet Take 1/2 tablet daily. (Patient taking differently: Take 12.5 mg by mouth daily. )  . mometasone-formoterol (DULERA) 100-5 MCG/ACT AERO Inhale 1 puff into the lungs 2 (two) times daily.   . montelukast (SINGULAIR) 10 MG tablet Take 1 tablet (10 mg total) by mouth at bedtime.  Glory Rosebush ULTRA test strip USE TO CHECK BLOOD SUGARS TWO TIMES A DAY  . OXYGEN 2lpm with sleep and 3lpm with exertion  . pantoprazole (  PROTONIX) 40 MG tablet TAKE ONE TABLET BY MOUTH TWICE A DAY BEFORE MEALS (Patient taking differently: Take 40 mg by mouth 2 (two) times daily before a meal. )  . predniSONE (DELTASONE) 5 MG tablet TAKE ONE TABLET BY MOUTH DAILY  . Respiratory Therapy Supplies (FLUTTER) DEVI Use as directed  . Turmeric 500 MG CAPS Take 500 mg by mouth daily.  Marland Kitchen venlafaxine XR (EFFEXOR-XR) 37.5 MG 24 hr  capsule Take 1 capsule (37.5 mg total) by mouth daily with breakfast.  . VITRON-C 65-125 MG TABS Take 1 tablet by mouth daily.                   Past Medical History: GASTROESOPHAGEAL REFLUX DISEASE     - Tapered reglan to 10 mg one half twice daily June 22, 2010 > d/c'd 11/2010  -restarted reglan 03/2011 >>unable to tolerate.  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)   -Restarted Prednisone 02/24/08   -Desats with > 2 laps 06/22/10 so rec wear 02 2lpm at bedtime and with ex  MORBID OBESITY   - Target wt  =  153  for BMI < 30  Hypertension Health Maintenance...........................................................  [place]urns     - Pneumovax July 22, 2009             Objective:   Physical Exam   amb obese bf   06/16/2020   246 12/08/2019     238  06/09/2019     245  wt  305 May 28, 2008 >   302 November 07, 2010 >   303 03/22/2011 > 10/25/2011  299 > 311 04/04/2012 > 306 03/17/2013 >06/15/2013 303 > 10/06/2013 298  > 02/08/2014 289 > 10/06/2014   288 > 03/09/2015 283 > 10/21/2015    283 >  02/06/2016  284 > 06/01/2016  282 > 08/31/2016   281 > 07/12/2017   270 > 11/26/2017   263> 06/30/2018   252 > 12/04/2018  244    Vital signs reviewed  06/16/2020  - Note at rest 02 sats  97% on RA      HEENT : pt wearing mask not removed for exam due to covid -19 concerns.    NECK :  without JVD/Nodes/TM/ nl carotid upstrokes bilaterally   LUNGS: no acc muscle use,  Nl contour chest with minimal insp crackles bases  bilaterally without cough on insp or exp maneuvers   CV:  RRR  no s3 or murmur or increase in P2, and no edema   ABD:  Obese soft and nontender with nl inspiratory excursion in the supine position. No bruits or organomegaly appreciated, bowel sounds nl  MS:  Nl gait/ ext warm without deformities, calf tenderness, cyanosis or clubbing No obvious joint restrictions   SKIN: warm and dry without lesions     NEURO:  alert, approp, nl sensorium with  no motor or cerebellar deficits apparent.                Assessment & Plan:

## 2020-06-16 NOTE — Assessment & Plan Note (Signed)
Dx 2016 / clinical  - PFTs.  03/09/2015  14% better p B2 and less cough p saba - 03/09/2015   > try dulera 100 2bid  - 08/31/2016   try increase to 200 2bid due to cough > no better so back to 100 2bid maint  -  11/26/2017  After extensive coaching inhaler device  effectiveness =  75% ( short Ti )    All goals of chronic asthma control met including optimal function and elimination of symptoms with minimal need for rescue therapy.  Contingencies discussed in full including contacting this office immediately if not controlling the symptoms using the rule of two's.

## 2020-06-16 NOTE — Assessment & Plan Note (Signed)
On 02 since 2011  As of 06/16/2020  rx = 2lpm hs and prn with activity up to 3lpm but no need at rest or adls

## 2020-06-16 NOTE — Assessment & Plan Note (Signed)
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)   -Restarted Prednisone 02/24/08   -PFT's December 23, 2008 VC 35%  DLC0 31%   -PFT's 02/08/2014   VC 1.30 (49%) and DLCO is 70%  > rec pred 10/5 > did not tolerate - PFTs  03/09/2015     VC 1.27 (48%) and dlco is 72% @ 10 mg daily  - 02/06/2016 try 10 a/w 5 mg daily > did not try - 06/01/2016 try 10 a/w 5 daily > cough  flared p 1 week on lower dose  -ANA positive 10/2016 >refer to rheumatology  -HRCT chest essentially stable changes since 2005 10/09/2016 >  Rheum eval 12/03/16 [facility]  And f/u q 3 mo as of 06/30/2018   - PFTs 12/04/2018  VC = 1.38 (54%)  And  dlco 13.72 =73% with corrects to 6.5 144 % on pred 5 mg daily = no significant change from prior   The goal with a chronic steroid dependent illness is always arriving at the lowest effective dose that controls the disease/symptoms and not accepting a set "formula" which is based on statistics or guidelines that don't always take into account patient  variability or the natural hx of the dz in every individual patient, which may well vary over time.  For now therefore I recommend the patient maintain  Prednisone 5 mg daily

## 2020-06-26 DIAGNOSIS — R091 Pleurisy: Secondary | ICD-10-CM | POA: Diagnosis not present

## 2020-06-26 DIAGNOSIS — M722 Plantar fascial fibromatosis: Secondary | ICD-10-CM | POA: Diagnosis not present

## 2020-06-26 DIAGNOSIS — E661 Drug-induced obesity: Secondary | ICD-10-CM | POA: Diagnosis not present

## 2020-06-28 ENCOUNTER — Other Ambulatory Visit: Payer: Self-pay | Admitting: *Deleted

## 2020-06-28 NOTE — Telephone Encounter (Signed)
Rec'd fax pt need PA for One touch ultra test strips. Insurance prefer Accu-chek or True Metrix.

## 2020-06-29 MED ORDER — ACCU-CHEK SOFTCLIX LANCETS MISC: 12 refills | Status: AC

## 2020-06-29 MED ORDER — ACCU-CHEK AVIVA PLUS W/DEVICE KIT: PACK | 0 refills | Status: DC

## 2020-06-29 MED ORDER — ACCU-CHEK AVIVA PLUS VI STRP: ORAL_STRIP | 12 refills | Status: DC

## 2020-06-29 NOTE — Telephone Encounter (Signed)
Sent rx for Accu-chek which is preferred by insurance.Marland KitchenJohny Chess

## 2020-07-06 ENCOUNTER — Other Ambulatory Visit: Payer: Self-pay | Admitting: Internal Medicine

## 2020-07-11 NOTE — Progress Notes (Signed)
Subjective:    Patient ID: [name], female    DOB: 20-Mar-1971, 49 y.o.   MRN: [id number]  HPI The patient is here for follow up of their chronic medical problems, including DM, htn, hyperlipidemia, obesity, gerd, anxiety, chronic left knee pain.  She is taking all of her medications as prescribed.   She is not exercising regularly.       Medications and allergies reviewed with patient and updated if appropriate.  Patient Active Problem List   Diagnosis Date Noted  . Genital herpes 04/11/2020  . History of nephrolithiasis 10/13/2019  . Anxiety 02/05/2018  . Peroneal tendinitis of lower leg, right 10/22/2017  . Chronic respiratory failure with hypoxia ([place]) 07/20/2017  . Knee pain, left 02/02/2016  . Cough variant asthma 03/09/2015  . Diabetes type 2, uncontrolled ([place]) 12/09/2012  . Dyslipidemia   . Acute on chronic respiratory failure with hypoxia ([place]) 04/05/2012  . Allergic rhinitis 01/05/2010  . Essential hypertension 09/07/2009  . URINARY INCONTINENCE, URGE 03/24/2009  . CHRONIC RHINITIS 12/23/2008  . Morbid obesity due to excess calories ([place]) 10/10/2007  . INTERSTITIAL LUNG DISEASE 10/10/2007  . GASTROESOPHAGEAL REFLUX DISEASE 10/10/2007    Current Outpatient Medications on File Prior to Visit  Medication Sig Dispense Refill  . Accu-Chek Softclix Lancets lancets Use to check blood sugars twice a day 100 each 12  . albuterol (PROVENTIL HFA;VENTOLIN HFA) 108 (90 Base) MCG/ACT inhaler Inhale 2 puffs into the lungs every 4 (four) hours as needed for wheezing or shortness of breath. 1 Inhaler 0  . APPLE CIDER VINEGAR PO Take 450 mg by mouth daily.    Marland Kitchen atorvastatin (LIPITOR) 20 MG tablet TAKE ONE TABLET BY MOUTH DAILY 90 tablet 1  . B COMPLEX VITAMINS SL Place 1 mL under the tongue 4 (four) times a week.    . Blood Glucose Monitoring Suppl (ACCU-CHEK AVIVA PLUS) w/Device KIT Use to check blood sugars twice a day 1 kit 0  . calcium-vitamin D (OSCAL WITH D) 500-200  MG-UNIT tablet Take 1 tablet by mouth daily.    . celecoxib (CELEBREX) 200 MG capsule Take 1 capsule (200 mg total) by mouth 2 (two) times daily. 60 capsule 5  . Dextromethorphan-guaiFENesin (MUCINEX DM MAXIMUM STRENGTH) 60-1200 MG TB12 Take 1 tablet by mouth every 12 (twelve) hours as needed (with flutter valve).    . Dulaglutide (TRULICITY) 3 ON/6.2XB SOPN Inject 3 mg into the skin once a week. 4 pen 5  . ELDERBERRY PO Take 1,000 mg by mouth daily.    . fluticasone (FLONASE) 50 MCG/ACT nasal spray Place 2 sprays into both nostrils daily.     Marland Kitchen glucose blood (ACCU-CHEK AVIVA PLUS) test strip Use to check blood sugars twice a day 100 each 12  . JANUMET XR [phone] MG TB24 TAKE ONE TABLET BY MOUTH DAILY 90 tablet 0  . JARDIANCE 25 MG TABS tablet TAKE ONE TABLET BY MOUTH EVERY MORNING WITH OR WITHOUT FOOD 90 tablet 0  . loratadine (CLARITIN) 10 MG tablet Take 10 mg by mouth at bedtime.     Marland Kitchen losartan (COZAAR) 25 MG tablet Take 1/2 tablet daily. (Patient taking differently: Take 12.5 mg by mouth daily. ) 45 tablet 1  . mometasone-formoterol (DULERA) 100-5 MCG/ACT AERO Inhale 1 puff into the lungs 2 (two) times daily.     . montelukast (SINGULAIR) 10 MG tablet Take 1 tablet (10 mg total) by mouth at bedtime. 30 tablet 5  . ONETOUCH ULTRA test strip USE TO  CHECK BLOOD SUGARS TWO TIMES A DAY 100 strip 2  . OXYGEN 2lpm with sleep and 3lpm with exertion    . pantoprazole (PROTONIX) 40 MG tablet TAKE ONE TABLET BY MOUTH TWICE A DAY BEFORE MEALS (Patient taking differently: Take 40 mg by mouth 2 (two) times daily before a meal. ) 120 tablet 1  . predniSONE (DELTASONE) 5 MG tablet TAKE ONE TABLET BY MOUTH DAILY 90 tablet 1  . Respiratory Therapy Supplies (FLUTTER) DEVI Use as directed 1 each 0  . Turmeric 500 MG CAPS Take 500 mg by mouth daily.    Marland Kitchen venlafaxine XR (EFFEXOR-XR) 37.5 MG 24 hr capsule Take 1 capsule (37.5 mg total) by mouth daily with breakfast. 90 capsule 0  . VITRON-C 65-125 MG TABS Take 1  tablet by mouth daily.     No current facility-administered medications on file prior to visit.    Past Medical History:  Diagnosis Date  . Allergic rhinitis   . Arthritis    Knees ankles  feet  . Bronchitis   . Chronic rhinitis   . Cough   . Dyslipidemia   . Dysphagia   . Dyspnea    occationally over last 2 years  . GERD (gastroesophageal reflux disease)   . Headache(784.0)   . ILD (interstitial lung disease) ([place])   . Mild depression ([place])   . Morbid obesity ([place])   . Type II or diabetes mellitus, uncontrolled 11/2012 dx   Dx 12/08/12: a1c 10.0  . Unspecified essential hypertension   . Urge incontinence     Past Surgical History:  Procedure Laterality Date  . ABDOMINAL HYSTERECTOMY    . BILATERAL VATS ABLATION  02/15/04  . CESAREAN SECTION    . KNEE ARTHROSCOPY WITH MEDIAL MENISECTOMY Right 04/01/2019   Procedure: KNEE ARTHROSCOPY WITH MEDIAL MENISECTOMY;  Surgeon: Hiram Gash, MD;  Location: [facility];  Service: Orthopedics;  Laterality: Right;  . KNEE ARTHROSCOPY WITH MEDIAL MENISECTOMY Left 01/06/2020   Procedure: KNEE ARTHROSCOPY WITH MEDIAL MENISECTOMY;  Surgeon: Hiram Gash, MD;  Location: [facility];  Service: Orthopedics;  Laterality: Left;  . LUNG BIOPSY    . RIGHT HEART CATH N/A 01/30/2017   Procedure: Right Heart Cath;  Surgeon: Larey Dresser, MD;  Location: [facility];  Service: Cardiovascular;  Laterality: N/A;    Social History   Socioeconomic History  . Marital status: Married    Spouse name: Not on file  . Number of children: 1  . Years of education: Not on file  . Highest education level: Not on file  Occupational History  . 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 Smoking status: Never Smoker  . Smokeless tobacco: Never Used  . Tobacco comment: {  Vaping Use  . Vaping Use: Never used  Substance and Sexual Activity  . Alcohol use: No    Alcohol/week: 0.0 standard drinks    . Drug use: No  . Sexual activity: Never  Other Topics Concern  . Not on file  Social History Narrative  . Not on file   Social Determinants of Health   Financial Resource Strain: Low Risk   . Difficulty of Paying Living Expenses: Not hard at all  Food Insecurity: No Food Insecurity  . Worried About Charity fundraiser in the Last Year: Never true  . Ran Out of Food in the Last Year: Never true  Transportation Needs:  No Transportation Needs  . Lack of Transportation (Medical): No  . Lack of Transportation (Non-Medical): No  Physical Activity:   . Days of Exercise per Week: Not on file  . Minutes of Exercise per Session: Not on file  Stress: No Stress Concern Present  . Feeling of Stress : Not at all  Social Connections: Socially Integrated  . Frequency of Communication with Friends and Family: More than three times a week  . Frequency of Social Gatherings with Friends and Family: Once a week  . Attends Religious Services: More than 4 times per year  . Active Member of Clubs or Organizations: Yes  . Attends Archivist Meetings: More than 4 times per year  . Marital Status: Married    Family History  Problem Relation Age of Onset  . Sarcoidosis Mother        dxed by transbrochial bx  . Allergies Mother   . Heart disease Father   . Diabetes Father   . Allergies Father   . Coronary artery disease Father   . Cancer Maternal Grandmother        CA of unknown type  . Cancer Paternal Grandmother        CA of unknown type    Review of Systems     Objective:  There were no vitals filed for this visit. BP Readings from Last 3 Encounters:  06/16/20 124/84  04/29/20 (!) 140/84  04/11/20 124/78   Wt Readings from Last 3 Encounters:  06/16/20 246 lb 12.8 oz (111.9 kg)  04/11/20 240 lb (108.9 kg)  04/11/20 240 lb (108.9 kg)   There is no height or weight on file to calculate BMI.   Physical Exam    Constitutional: Appears well-developed and well-nourished.  No distress.  HENT:  Head: Normocephalic and atraumatic.  Neck: Neck supple. No tracheal deviation present. No thyromegaly present.  No cervical lymphadenopathy Cardiovascular: Normal rate, regular rhythm and normal heart sounds.   No murmur heard. No carotid bruit .  No edema Pulmonary/Chest: Effort normal and breath sounds normal. No respiratory distress. No has no wheezes. No rales.  Skin: Skin is warm and dry. Not diaphoretic.  Psychiatric: Normal mood and affect. Behavior is normal.      Assessment & Plan:    See Problem List for Assessment and Plan of chronic medical problems.    This visit occurred during the SARS-CoV-2 public health emergency.  Safety protocols were in place, including screening questions prior to the visit, additional usage of staff PPE, and extensive cleaning of exam room while observing appropriate contact time as indicated for disinfecting solutions.    This encounter was created in error - please disregard.

## 2020-07-11 NOTE — Patient Instructions (Signed)
  Blood work was ordered.     Medications reviewed and updated.  Changes include :     Your prescription(s) have been submitted to your pharmacy. Please take as directed and contact our office if you believe you are having problem(s) with the medication(s).  A referral was ordered for        Someone from their office will call you to schedule an appointment.    Please followup in 6 months   

## 2020-07-12 ENCOUNTER — Encounter: Payer: Medicare PPO | Admitting: Internal Medicine

## 2020-07-26 DIAGNOSIS — M722 Plantar fascial fibromatosis: Secondary | ICD-10-CM | POA: Diagnosis not present

## 2020-07-26 DIAGNOSIS — R091 Pleurisy: Secondary | ICD-10-CM | POA: Diagnosis not present

## 2020-07-26 DIAGNOSIS — E661 Drug-induced obesity: Secondary | ICD-10-CM | POA: Diagnosis not present

## 2020-08-01 ENCOUNTER — Other Ambulatory Visit: Payer: Self-pay | Admitting: Internal Medicine

## 2020-08-05 ENCOUNTER — Other Ambulatory Visit: Payer: Self-pay | Admitting: Internal Medicine

## 2020-08-14 NOTE — Patient Instructions (Addendum)
  Blood work was ordered.    Flu immunization administered today.      Medications reviewed and updated.  Changes include :   none     Please followup in 6 months   

## 2020-08-14 NOTE — Progress Notes (Signed)
Subjective:    Patient ID: [name], female    DOB: [birth year], 49 y.o.   MRN: [id number]  HPI The patient is here for follow up of their chronic medical problems, including htn, DM, hyperlipidemia, obesity, GERD, anxiety, chronic left knee pain   She is not exercising regularly.   She is eating low sugar diet.  Her sugars are less than 120 in the morning.    Gas - if she eats grapes, cucumbers and beans make her gassy.  She has to avoid seeds and nuts due to diverticulitis.  She has to take something three times a week for the gas.  The gas is upper and lower GI.  Lower GI gas is painful.  Her bowels are normal.     ergies reviewed with patient and updated if appropriate.  Patient Active Problem List   Diagnosis Date Noted  . Genital herpes 04/11/2020  . History of nephrolithiasis 10/13/2019  . Anxiety 02/05/2018  . Peroneal tendinitis of lower leg, right 10/22/2017  . Chronic respiratory failure with hypoxia ([place]) 07/20/2017  . Knee pain, left 02/02/2016  . Cough variant asthma 03/09/2015  . Diabetes type 2, uncontrolled ([place]) 12/09/2012  . Dyslipidemia   . Acute on chronic respiratory failure with hypoxia ([place]) 04/05/2012  . Allergic rhinitis 01/05/2010  . Essential hypertension 09/07/2009  . URINARY INCONTINENCE, URGE 03/24/2009  . CHRONIC RHINITIS 12/23/2008  . Morbid obesity due to excess calories ([place]) 10/10/2007  . INTERSTITIAL LUNG DISEASE 10/10/2007  . GASTROESOPHAGEAL REFLUX DISEASE 10/10/2007    Current Outpatient Medications on File Prior to Visit  Medication Sig Dispense Refill  . Accu-Chek Softclix Lancets lancets Use to check blood sugars twice a day 100 each 12  . albuterol (PROVENTIL HFA;VENTOLIN HFA) 108 (90 Base) MCG/ACT inhaler Inhale 2 puffs into the lungs every 4 (four) hours as needed for wheezing or shortness of breath. 1 Inhaler 0  . APPLE CIDER VINEGAR PO Take 450 mg by mouth daily.    Marland Kitchen atorvastatin (LIPITOR) 20 MG tablet TAKE ONE TABLET  BY MOUTH DAILY 90 tablet 1  . B COMPLEX VITAMINS SL Place 1 mL under the tongue 4 (four) times a week.    . Blood Glucose Monitoring Suppl (ACCU-CHEK AVIVA PLUS) w/Device KIT Use to check blood sugars twice a day 1 kit 0  . calcium-vitamin D (OSCAL WITH D) 500-200 MG-UNIT tablet Take 1 tablet by mouth daily.    . celecoxib (CELEBREX) 200 MG capsule Take 1 capsule (200 mg total) by mouth 2 (two) times daily. 60 capsule 5  . Dextromethorphan-guaiFENesin (MUCINEX DM MAXIMUM STRENGTH) 60-1200 MG TB12 Take 1 tablet by mouth every 12 (twelve) hours as needed (with flutter valve).    Marland Kitchen ELDERBERRY PO Take 1,000 mg by mouth daily.    . fluticasone (FLONASE) 50 MCG/ACT nasal spray Place 2 sprays into both nostrils daily.     Marland Kitchen glucose blood (ACCU-CHEK AVIVA PLUS) test strip Use to check blood sugars twice a day 100 each 12  . JANUMET XR [phone] MG TB24 TAKE ONE TABLET BY MOUTH DAILY 90 tablet 0  . JARDIANCE 25 MG TABS tablet TAKE ONE TABLET BY MOUTH EVERY MORNING WITH OR WITHOUT FOOD 90 tablet 0  . loratadine (CLARITIN) 10 MG tablet Take 10 mg by mouth at bedtime.     Marland Kitchen losartan (COZAAR) 25 MG tablet TAKE 1/2 TABLET BY MOUTH DAILY 45 tablet 1  . mometasone-formoterol (DULERA) 100-5 MCG/ACT AERO Inhale 1 puff into the lungs  2 (two) times daily.     . montelukast (SINGULAIR) 10 MG tablet Take 1 tablet (10 mg total) by mouth at bedtime. 30 tablet 5  . ONETOUCH ULTRA test strip USE TO CHECK BLOOD SUGARS TWO TIMES A DAY 100 strip 2  . OXYGEN 2lpm with sleep and 3lpm with exertion    . pantoprazole (PROTONIX) 40 MG tablet TAKE ONE TABLET BY MOUTH TWICE A DAY BEFORE MEALS (Patient taking differently: Take 40 mg by mouth 2 (two) times daily before a meal. ) 120 tablet 1  . predniSONE (DELTASONE) 5 MG tablet TAKE ONE TABLET BY MOUTH DAILY 90 tablet 1  . Respiratory Therapy Supplies (FLUTTER) DEVI Use as directed 1 each 0  . TRULICITY 3 WE/3.1VQ SOPN INJECT 3 MG UNDER THE SKIN ONCE WEEKLY 2 mL 3  . Turmeric 500 MG  CAPS Take 500 mg by mouth daily.    Marland Kitchen venlafaxine XR (EFFEXOR-XR) 37.5 MG 24 hr capsule Take 1 capsule (37.5 mg total) by mouth daily with breakfast. 90 capsule 0  . VITRON-C 65-125 MG TABS Take 1 tablet by mouth daily.     No current facility-administered medications on file prior to visit.    Past Medical History:  Diagnosis Date  . Allergic rhinitis   . Arthritis    Knees ankles  feet  . Bronchitis   . Chronic rhinitis   . Cough   . Dyslipidemia   . Dysphagia   . Dyspnea    occationally over last 2 years  . GERD (gastroesophageal reflux disease)   . Headache(784.0)   . ILD (interstitial lung disease) ([place])   . Mild depression ([place])   . Morbid obesity ([place])   . Type II or diabetes mellitus, uncontrolled 11/2012 dx   Dx 12/08/12: a1c 10.0  . Unspecified essential hypertension   . Urge incontinence     Past Surgical History:  Procedure Laterality Date  . ABDOMINAL HYSTERECTOMY    . BILATERAL VATS ABLATION  02/15/04  . CESAREAN SECTION    . KNEE ARTHROSCOPY WITH MEDIAL MENISECTOMY Right 04/01/2019   Procedure: KNEE ARTHROSCOPY WITH MEDIAL MENISECTOMY;  Surgeon: Hiram Gash, MD;  [facility];  Service: Orthopedics;  Laterality: Right;  . KNEE ARTHROSCOPY WITH MEDIAL MENISECTOMY Left 01/06/2020   Procedure: KNEE ARTHROSCOPY WITH MEDIAL MENISECTOMY;  Surgeon: Hiram Gash, MD;  [facility];  Service: Orthopedics;  Laterality: Left;  . LUNG BIOPSY    . RIGHT HEART CATH N/A 01/30/2017   Procedure: Right Heart Cath;  Surgeon: Larey Dresser, MD;  Location: [facility];  Service: Cardiovascular;  Laterality: N/A;    Social History   Socioeconomic History  . Marital status: Married    Spouse name: Not on file  . Number of children: 1  . Years of education: Not on file  . Highest education level: Not on file  Occupational History  . 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 Smoking status:  Never Smoker  . Smokeless tobacco: Never Used  . Tobacco comment: {  Vaping Use  . Vaping Use: Never used  Substance and Sexual Activity  . Alcohol use: No    Alcohol/week: 0.0 standard drinks  . Drug use: No  . Sexual activity: Never  Other Topics Concern  . Not on file  Social History Narrative  . Not on file   Social Determinants of Health   Financial  Resource Strain: Low Risk   . Difficulty of Paying Living Expenses: Not hard at all  Food Insecurity: No Food Insecurity  . Worried About Charity fundraiser in the Last Year: Never true  . Ran Out of Food in the Last Year: Never true  Transportation Needs: No Transportation Needs  . Lack of Transportation (Medical): No  . Lack of Transportation (Non-Medical): No  Physical Activity:   . Days of Exercise per Week: Not on file  . Minutes of Exercise per Session: Not on file  Stress: No Stress Concern Present  . Feeling of Stress : Not at all  Social Connections: Socially Integrated  . Frequency of Communication with Friends and Family: More than three times a week  . Frequency of Social Gatherings with Friends and Family: Once a week  . Attends Religious Services: More than 4 times per year  . Active Member of Clubs or Organizations: Yes  . Attends Archivist Meetings: More than 4 times per year  . Marital Status: Married    Family History  Problem Relation Age of Onset  . Sarcoidosis Mother        dxed by transbrochial bx  . Allergies Mother   . Heart disease Father   . Diabetes Father   . Allergies Father   . Coronary artery disease Father   . Cancer Maternal Grandmother        CA of unknown type  . Cancer Paternal Grandmother        CA of unknown type    Review of Systems  Constitutional: Negative for chills and fever.  HENT: Positive for sinus pressure (allergy related).   Respiratory: Positive for cough (allergy related). Negative for shortness of breath and wheezing.   Cardiovascular: Negative  for chest pain, palpitations and leg swelling.  Neurological: Positive for light-headedness. Negative for headaches.       Objective:   Vitals:   08/16/20 0753  BP: 120/82  Pulse: 70  Resp: 18  Temp: 98 F (36.7 C)   BP Readings from Last 3 Encounters:  08/16/20 120/82  06/16/20 124/84  04/29/20 (!) 140/84   Wt Readings from Last 3 Encounters:  08/16/20 240 lb (108.9 kg)  06/16/20 246 lb 12.8 oz (111.9 kg)  04/11/20 240 lb (108.9 kg)   Body mass index is 46.87 kg/m.   Physical Exam    Constitutional: Appears well-developed and well-nourished. No distress.  HENT:  Head: Normocephalic and atraumatic.  Neck: Neck supple. No tracheal deviation present. No thyromegaly present.  No cervical lymphadenopathy Cardiovascular: Normal rate, regular rhythm and normal heart sounds.   No murmur heard. No carotid bruit .  No edema Pulmonary/Chest: Effort normal and breath sounds normal. No respiratory distress. No has no wheezes. No rales.  Skin: Skin is warm and dry. Not diaphoretic.  Psychiatric: Normal mood and affect. Behavior is normal.   Diabetic Foot Exam - Simple   Simple Foot Form Visual Inspection No deformities, no ulcerations, no other skin breakdown bilaterally: Yes Sensation Testing Intact to touch and monofilament testing bilaterally: Yes Pulse Check Posterior Tibialis and Dorsalis pulse intact bilaterally: Yes Comments       Assessment & Plan:    See Problem List for Assessment and Plan of chronic medical problems.    This visit occurred during the SARS-CoV-2 public health emergency.  Safety protocols were in place, including screening questions prior to the visit, additional usage of staff PPE, and extensive cleaning of exam room while observing  appropriate contact time as indicated for disinfecting solutions.

## 2020-08-16 ENCOUNTER — Ambulatory Visit: Payer: Medicare PPO | Admitting: Internal Medicine

## 2020-08-16 ENCOUNTER — Other Ambulatory Visit: Payer: Self-pay

## 2020-08-16 ENCOUNTER — Encounter: Payer: Self-pay | Admitting: Internal Medicine

## 2020-08-16 VITALS — BP 120/82 | HR 70 | Temp 98.0°F | Resp 18 | Ht 60.0 in | Wt 240.0 lb

## 2020-08-16 DIAGNOSIS — M25562 Pain in left knee: Secondary | ICD-10-CM | POA: Diagnosis not present

## 2020-08-16 DIAGNOSIS — K219 Gastro-esophageal reflux disease without esophagitis: Secondary | ICD-10-CM

## 2020-08-16 DIAGNOSIS — G8929 Other chronic pain: Secondary | ICD-10-CM

## 2020-08-16 DIAGNOSIS — I10 Essential (primary) hypertension: Secondary | ICD-10-CM

## 2020-08-16 DIAGNOSIS — E785 Hyperlipidemia, unspecified: Secondary | ICD-10-CM | POA: Diagnosis not present

## 2020-08-16 DIAGNOSIS — Z1159 Encounter for screening for other viral diseases: Secondary | ICD-10-CM | POA: Diagnosis not present

## 2020-08-16 DIAGNOSIS — F419 Anxiety disorder, unspecified: Secondary | ICD-10-CM

## 2020-08-16 DIAGNOSIS — E1165 Type 2 diabetes mellitus with hyperglycemia: Secondary | ICD-10-CM

## 2020-08-16 LAB — COMPREHENSIVE METABOLIC PANEL
ALT: 16 U/L (ref 0–35)
AST: 18 U/L (ref 0–37)
Albumin: 4.2 g/dL (ref 3.5–5.2)
Alkaline Phosphatase: 74 U/L (ref 39–117)
BUN: 15 mg/dL (ref 6–23)
CO2: 31 mEq/L (ref 19–32)
Calcium: 10.2 mg/dL (ref 8.4–10.5)
Chloride: 100 mEq/L (ref 96–112)
Creatinine, Ser: 0.9 mg/dL (ref 0.40–1.20)
GFR: 75.66 mL/min (ref >60.00)
Glucose, Bld: 120 mg/dL — ABNORMAL HIGH (ref 70–99)
Potassium: 4.6 mEq/L (ref 3.5–5.1)
Sodium: 137 mEq/L (ref 135–145)
Total Bilirubin: 0.4 mg/dL (ref 0.2–1.2)
Total Protein: 7.7 g/dL (ref 6.0–8.3)

## 2020-08-16 LAB — LIPID PANEL
Cholesterol: 168 mg/dL (ref 0–200)
HDL: 58.9 mg/dL (ref >39.00)
LDL Cholesterol: 96 mg/dL (ref 0–99)
NonHDL: 109.22
Total CHOL/HDL Ratio: 3
Triglycerides: 66 mg/dL (ref 0.0–149.0)
VLDL: 13.2 mg/dL (ref 0.0–40.0)

## 2020-08-16 LAB — HEMOGLOBIN A1C: Hgb A1c MFr Bld: 7.9 % — ABNORMAL HIGH (ref 4.6–6.5)

## 2020-08-16 NOTE — Assessment & Plan Note (Signed)
Chronic Controlled, stable Continue Effexor 37.5 mg daily  

## 2020-08-16 NOTE — Assessment & Plan Note (Signed)
Chronic BP well controlled Continue losartan 12.5 mg daily cmp

## 2020-08-16 NOTE — Assessment & Plan Note (Signed)
Chronic Continue Celebrex 200 mg twice daily Stressed regular exercise and weight loss

## 2020-08-16 NOTE — Assessment & Plan Note (Signed)
Chronic She is working on weight loss Stressed regular exercise, decrease portions, low sugar/carbohydrate diet

## 2020-08-16 NOTE — Assessment & Plan Note (Signed)
Chronic GERD controlled Continue pantoprazole 40 mg twice daily

## 2020-08-16 NOTE — Assessment & Plan Note (Signed)
Chronic Check lipid panel  Continue atorvastatin 20 mg daily Regular exercise and healthy diet encouraged  

## 2020-08-16 NOTE — Assessment & Plan Note (Signed)
Chronic Sugars have not been ideally controlled Stressed weight loss Continue low sugar diet Stressed the importance of regular exercise Continue Trulicity 3 mg weekly-can increase to 4.5 mg if needed Continue Jardiance 25 mg daily and Janumet [phone] mg daily Check A1c-we will adjust medications-we will likely discontinue Januvia, increase Metformin and/or Trulicity

## 2020-08-17 ENCOUNTER — Other Ambulatory Visit: Payer: Self-pay | Admitting: Internal Medicine

## 2020-08-17 DIAGNOSIS — Z23 Encounter for immunization: Secondary | ICD-10-CM | POA: Diagnosis not present

## 2020-08-17 LAB — HEPATITIS C ANTIBODY
Hepatitis C Ab: NONREACTIVE
SIGNAL TO CUT-OFF: 0.01 (ref <1.00)

## 2020-08-17 NOTE — Addendum Note (Signed)
Addended by: Marcina Millard on: 08/17/2020 03:34 PM   Modules accepted: Orders

## 2020-08-21 ENCOUNTER — Encounter: Payer: Self-pay | Admitting: Internal Medicine

## 2020-08-21 MED ORDER — TRULICITY 4.5 MG/0.5ML ~~LOC~~ SOAJ: 4.5000 mg | SUBCUTANEOUS | 1 refills | Status: DC

## 2020-08-22 MED ORDER — VENLAFAXINE HCL ER 37.5 MG PO CP24
37.5000 mg | ORAL_CAPSULE | Freq: Every day | ORAL | 2 refills | Status: DC
Start: 2020-08-22 — End: 2021-05-18

## 2020-08-22 MED ORDER — METFORMIN HCL ER (OSM) 1000 MG PO TB24: 2000.0000 mg | ORAL_TABLET | Freq: Every day | ORAL | 1 refills | Status: DC

## 2020-08-22 NOTE — Addendum Note (Signed)
Addended by: Binnie Rail on: 08/22/2020 07:26 AM   Modules accepted: Orders

## 2020-08-26 DIAGNOSIS — R091 Pleurisy: Secondary | ICD-10-CM | POA: Diagnosis not present

## 2020-08-26 DIAGNOSIS — M722 Plantar fascial fibromatosis: Secondary | ICD-10-CM | POA: Diagnosis not present

## 2020-08-26 DIAGNOSIS — E661 Drug-induced obesity: Secondary | ICD-10-CM | POA: Diagnosis not present

## 2020-09-05 MED ORDER — METFORMIN HCL ER 750 MG PO TB24: 1500.0000 mg | ORAL_TABLET | Freq: Every day | ORAL | 1 refills | Status: DC

## 2020-09-05 MED ORDER — METFORMIN HCL ER (OSM) 1000 MG PO TB24
2000.0000 mg | ORAL_TABLET | Freq: Every day | ORAL | 1 refills | Status: DC
Start: 2020-09-05 — End: 2020-09-05

## 2020-09-05 NOTE — Addendum Note (Signed)
Addended by: Binnie Rail on: 09/05/2020 07:25 AM   Modules accepted: Orders

## 2020-09-05 NOTE — Addendum Note (Signed)
Addended by: Binnie Rail on: 09/05/2020 09:52 PM   Modules accepted: Orders

## 2020-09-12 ENCOUNTER — Other Ambulatory Visit: Payer: Self-pay | Admitting: Internal Medicine

## 2020-09-13 ENCOUNTER — Telehealth: Payer: Self-pay

## 2020-09-13 NOTE — Telephone Encounter (Signed)
[name] [name] - Rx #: [id number] Need help? Call us at [phone] Outcome: Additional Information Required  Available without authorization.  Drug metFORMIN HCl ER (OSM) 1000MG  er tablets Form [facility] Electronic PA Form Original Claim Info [phone] Try METFORMINInsurance prefers [id number] [name] [facility] ER Health Plans Preferred Product METFORMIN [facility] [phone]

## 2020-09-17 ENCOUNTER — Telehealth: Payer: Self-pay | Admitting: Internal Medicine

## 2020-09-17 NOTE — Telephone Encounter (Signed)
Received 3rd moderna in sept 2021 now with increase cough/ sob for NSIP s fever cp n or v or chills or sinus c/o's or ody aches so rec  Continue ppi bid ac Increase pred to 20 mg daily until better then 10 mg daily x 3 days and back to 5 mg daily  Delsym cough syrup instead of mucinex dm if having dry cough

## 2020-09-20 ENCOUNTER — Encounter: Payer: Self-pay | Admitting: Internal Medicine

## 2020-09-20 NOTE — Telephone Encounter (Signed)
MW this is a message from the pt on an update of how she is doing. Just FYI

## 2020-09-22 ENCOUNTER — Other Ambulatory Visit: Payer: Self-pay | Admitting: Internal Medicine

## 2020-09-22 NOTE — Telephone Encounter (Signed)
  Patient calling to discuss changing Metformin, Advised Dr Quay Burow not in office today. Patient seeking advice

## 2020-09-25 DIAGNOSIS — R091 Pleurisy: Secondary | ICD-10-CM | POA: Diagnosis not present

## 2020-09-25 DIAGNOSIS — E661 Drug-induced obesity: Secondary | ICD-10-CM | POA: Diagnosis not present

## 2020-09-25 DIAGNOSIS — M722 Plantar fascial fibromatosis: Secondary | ICD-10-CM | POA: Diagnosis not present

## 2020-09-26 NOTE — Telephone Encounter (Signed)
[facility] Call/Report : ---Caller states she started taking Metformin. She has been having abdominal pain, cramping, and nausea for 10 days. She has been exchanging emails with the MD and changed the dosing instructions, but now she has been having constant pain. She heard from Dr. Ronnald Ramp and in an email and he instructed her to stop the Metformin and follow up at a later time for A1C follow up, no new meds prescribed. Caller is concerned. Temp: 98.7 (oral  Advised go to ED.  Patient did not go as you see below.

## 2020-09-28 ENCOUNTER — Ambulatory Visit: Payer: Medicare PPO | Admitting: Internal Medicine

## 2020-09-28 ENCOUNTER — Encounter: Payer: Self-pay | Admitting: Internal Medicine

## 2020-09-28 ENCOUNTER — Other Ambulatory Visit: Payer: Self-pay

## 2020-09-28 DIAGNOSIS — E1165 Type 2 diabetes mellitus with hyperglycemia: Secondary | ICD-10-CM

## 2020-09-28 DIAGNOSIS — J8410 Pulmonary fibrosis, unspecified: Secondary | ICD-10-CM

## 2020-09-28 DIAGNOSIS — I10 Essential (primary) hypertension: Secondary | ICD-10-CM | POA: Diagnosis not present

## 2020-09-28 MED ORDER — REPAGLINIDE 1 MG PO TABS: 1.0000 mg | ORAL_TABLET | Freq: Three times a day (TID) | ORAL | 11 refills | Status: DC

## 2020-09-28 MED ORDER — VITAMIN D3 50 MCG (2000 UT) PO CAPS: 2000.0000 [IU] | ORAL_CAPSULE | Freq: Every day | ORAL | 3 refills | Status: AC

## 2020-09-28 NOTE — Progress Notes (Signed)
Subjective:  Patient ID: [name], female    DOB: 02-04-1971  Age: 49 y.o. MRN: [id number]  CC: Medication Problem   HPI [name] presents for DM:  Unable to take Metformin. Not taking Jardiance now for ?reasons. It is $$$. Trulicity is $$$ too. C/o knee OA   Outpatient Medications Prior to Visit  Medication Sig Dispense Refill  . Accu-Chek Softclix Lancets lancets Use to check blood sugars twice a day 100 each 12  . albuterol (PROVENTIL HFA;VENTOLIN HFA) 108 (90 Base) MCG/ACT inhaler Inhale 2 puffs into the lungs every 4 (four) hours as needed for wheezing or shortness of breath. 1 Inhaler 0  . APPLE CIDER VINEGAR PO Take 450 mg by mouth daily.    Marland Kitchen atorvastatin (LIPITOR) 20 MG tablet TAKE ONE TABLET BY MOUTH DAILY 90 tablet 1  . B COMPLEX VITAMINS SL Place 1 mL under the tongue 4 (four) times a week.    . Blood Glucose Monitoring Suppl (ACCU-CHEK AVIVA PLUS) w/Device KIT Use to check blood sugars twice a day 1 kit 0  . calcium-vitamin D (OSCAL WITH D) 500-200 MG-UNIT tablet Take 1 tablet by mouth daily.    . celecoxib (CELEBREX) 200 MG capsule TAKE ONE CAPSULE BY MOUTH TWICE A DAY 60 capsule 5  . Dextromethorphan-guaiFENesin (MUCINEX DM MAXIMUM STRENGTH) 60-1200 MG TB12 Take 1 tablet by mouth every 12 (twelve) hours as needed (with flutter valve).    . Dulaglutide (TRULICITY) 4.5 TO/6.7TI SOPN Inject 4.5 mg as directed once a week. 6 mL 1  . ELDERBERRY PO Take 1,000 mg by mouth daily.    . fluticasone (FLONASE) 50 MCG/ACT nasal spray Place 2 sprays into both nostrils daily.     Marland Kitchen glucose blood (ACCU-CHEK AVIVA PLUS) test strip Use to check blood sugars twice a day 100 each 12  . JARDIANCE 25 MG TABS tablet TAKE ONE TABLET BY MOUTH EVERY MORNING WITH OR WITHOUT FOOD 90 tablet 0  . loratadine (CLARITIN) 10 MG tablet Take 10 mg by mouth at bedtime.     Marland Kitchen losartan (COZAAR) 25 MG tablet TAKE 1/2 TABLET BY MOUTH DAILY 45 tablet 1  . mometasone-formoterol (DULERA) 100-5 MCG/ACT AERO  Inhale 1 puff into the lungs 2 (two) times daily.     . montelukast (SINGULAIR) 10 MG tablet Take 1 tablet (10 mg total) by mouth at bedtime. 30 tablet 5  . ONETOUCH ULTRA test strip USE TO CHECK BLOOD SUGARS TWO TIMES A DAY 100 strip 2  . OXYGEN 2lpm with sleep and 3lpm with exertion    . pantoprazole (PROTONIX) 40 MG tablet TAKE ONE TABLET BY MOUTH TWICE A DAY BEFORE MEALS (Patient taking differently: Take 40 mg by mouth 2 (two) times daily before a meal. ) 120 tablet 1  . predniSONE (DELTASONE) 5 MG tablet TAKE ONE TABLET BY MOUTH DAILY 90 tablet 1  . Respiratory Therapy Supplies (FLUTTER) DEVI Use as directed 1 each 0  . Turmeric 500 MG CAPS Take 500 mg by mouth daily.    Marland Kitchen venlafaxine XR (EFFEXOR-XR) 37.5 MG 24 hr capsule Take 1 capsule (37.5 mg total) by mouth daily with breakfast. 90 capsule 2  . VITRON-C 65-125 MG TABS Take 1 tablet by mouth daily.     No facility-administered medications prior to visit.    ROS: Review of Systems  Constitutional: Negative for activity change, appetite change, chills, fatigue and unexpected weight change.  HENT: Negative for congestion, mouth sores and sinus pressure.   Eyes:  Negative for visual disturbance.  Respiratory: Negative for cough and chest tightness.   Gastrointestinal: Negative for abdominal pain and nausea.  Genitourinary: Negative for difficulty urinating, frequency and vaginal pain.  Musculoskeletal: Positive for arthralgias and gait problem. Negative for back pain.  Skin: Negative for pallor and rash.  Neurological: Negative for dizziness, tremors, weakness, numbness and headaches.  Psychiatric/Behavioral: Negative for confusion and sleep disturbance.    Objective:  BP 120/78   Pulse 97   Temp 97.7 F (36.5 C) (Oral)   Ht 5' (1.524 m)   Wt 239 lb (108.4 kg)   SpO2 95%   BMI 46.68 kg/m   BP Readings from Last 3 Encounters:  09/28/20 120/78  08/16/20 120/82  06/16/20 124/84    Wt Readings from Last 3 Encounters:   09/28/20 239 lb (108.4 kg)  08/16/20 240 lb (108.9 kg)  06/16/20 246 lb 12.8 oz (111.9 kg)    Physical Exam Constitutional:      General: She is not in acute distress.    Appearance: She is well-developed. She is obese.  HENT:     Head: Normocephalic.     Right Ear: External ear normal.     Left Ear: External ear normal.     Nose: Nose normal.     Mouth/Throat:     Mouth: Oropharynx is clear and moist.  Eyes:     General:        Right eye: No discharge.        Left eye: No discharge.     Conjunctiva/sclera: Conjunctivae normal.     Pupils: Pupils are equal, round, and reactive to light.  Neck:     Thyroid: No thyromegaly.     Vascular: No JVD.     Trachea: No tracheal deviation.  Cardiovascular:     Rate and Rhythm: Normal rate and regular rhythm.     Heart sounds: Normal heart sounds.  Pulmonary:     Effort: No respiratory distress.     Breath sounds: No stridor. No wheezing.  Abdominal:     General: Bowel sounds are normal. There is no distension.     Palpations: Abdomen is soft. There is no mass.     Tenderness: There is no abdominal tenderness. There is no guarding or rebound.  Musculoskeletal:        General: No tenderness or edema.     Cervical back: Normal range of motion and neck supple.  Lymphadenopathy:     Cervical: No cervical adenopathy.  Skin:    Findings: No erythema or rash.  Neurological:     Cranial Nerves: No cranial nerve deficit.     Motor: No abnormal muscle tone.     Coordination: Coordination normal.     Deep Tendon Reflexes: Reflexes normal.  Psychiatric:        Mood and Affect: Mood and affect normal.        Behavior: Behavior normal.        Thought Content: Thought content normal.        Judgment: Judgment normal.     Lab Results  Component Value Date   WBC 8.1 12/29/2019   HGB 14.4 12/29/2019   HCT 46.4 (H) 12/29/2019   PLT 232 12/29/2019   GLUCOSE 120 (H) 08/16/2020   CHOL 168 08/16/2020   TRIG 66.0 08/16/2020   HDL 58.90  08/16/2020   LDLDIRECT 153.1 12/08/2012   LDLCALC 96 08/16/2020   ALT 16 08/16/2020   AST 18 08/16/2020   NA 137  08/16/2020   K 4.6 08/16/2020   CL 100 08/16/2020   CREATININE 0.90 08/16/2020   BUN 15 08/16/2020   CO2 31 08/16/2020   TSH 1.25 05/09/2017   INR 1.01 01/30/2017   HGBA1C 7.9 (H) 08/16/2020   MICROALBUR 1.1 05/16/2016    No results found.  Assessment & Plan:    Walker Kehr, MD

## 2020-09-28 NOTE — Assessment & Plan Note (Signed)
Well controlled 

## 2020-09-28 NOTE — Assessment & Plan Note (Signed)
Better - cont w/wt loss

## 2020-09-28 NOTE — Assessment & Plan Note (Addendum)
On Trulicity now Jardiance is $$$ but good and well tolerated - not taking Try Prandin ac tid  RTC - Dr Lawerance Bach Start exercising

## 2020-09-28 NOTE — Assessment & Plan Note (Signed)
On Prenisone

## 2020-10-05 DIAGNOSIS — H40033 Anatomical narrow angle, bilateral: Secondary | ICD-10-CM | POA: Diagnosis not present

## 2020-10-05 DIAGNOSIS — E119 Type 2 diabetes mellitus without complications: Secondary | ICD-10-CM | POA: Diagnosis not present

## 2020-10-05 LAB — HM DIABETES EYE EXAM

## 2020-10-26 DIAGNOSIS — E661 Drug-induced obesity: Secondary | ICD-10-CM | POA: Diagnosis not present

## 2020-10-26 DIAGNOSIS — M722 Plantar fascial fibromatosis: Secondary | ICD-10-CM | POA: Diagnosis not present

## 2020-10-26 DIAGNOSIS — R091 Pleurisy: Secondary | ICD-10-CM | POA: Diagnosis not present

## 2020-11-11 ENCOUNTER — Other Ambulatory Visit: Payer: Self-pay | Admitting: Internal Medicine

## 2020-11-26 DIAGNOSIS — E661 Drug-induced obesity: Secondary | ICD-10-CM | POA: Diagnosis not present

## 2020-11-26 DIAGNOSIS — M722 Plantar fascial fibromatosis: Secondary | ICD-10-CM | POA: Diagnosis not present

## 2020-11-26 DIAGNOSIS — R091 Pleurisy: Secondary | ICD-10-CM | POA: Diagnosis not present

## 2020-11-30 ENCOUNTER — Other Ambulatory Visit: Payer: Self-pay | Admitting: Internal Medicine

## 2020-12-03 ENCOUNTER — Other Ambulatory Visit: Payer: Self-pay | Admitting: Internal Medicine

## 2020-12-14 ENCOUNTER — Other Ambulatory Visit: Payer: Self-pay

## 2020-12-14 ENCOUNTER — Ambulatory Visit: Payer: Medicare PPO | Admitting: Internal Medicine

## 2020-12-14 ENCOUNTER — Encounter: Payer: Self-pay | Admitting: Internal Medicine

## 2020-12-14 DIAGNOSIS — J8410 Pulmonary fibrosis, unspecified: Secondary | ICD-10-CM | POA: Diagnosis not present

## 2020-12-14 DIAGNOSIS — J9611 Chronic respiratory failure with hypoxia: Secondary | ICD-10-CM | POA: Diagnosis not present

## 2020-12-14 NOTE — Progress Notes (Signed)
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  Since then waxing/waning sx requiring intermittent prednisone tapers with only minimal improvement - most of her symptoms chronically have been related to obesity with dyspnea on exertion and tendency to chronic cough which is felt to be at least partly  reflux related but improved with saba 03/09/2015 so started on dulera 100 2bid in addition to maint pred and seems to have helped but not typically  able to get < 10 mg pred s flare cough/sob     History of Present Illness  November 07, 2010 ov cc cough/ strangling  a bit more with taper off reglan (actually worse before ran out of the low dose reglan)  no excess mucus or increase sob on prednisone @  10 mg daily .  Using med calendar well rec 1)  Ok to resume water aerobics but pace yourself 2)  Wait until your swallowing evaulation is complete before making a decision re reglan > stopped it on her own.  3)  Try Prednisone 10 mg one-half each am   04/04/2012 f/u ov/[name] no longer using med calendar on Pred 10 mg one daily  added hyzaar to rx for hbp/leg swelling not back to mall walking yet,  No unusual cough, purulent sputum or sinus/hb symptoms on present rx. No variability to symptoms or need for inhaler. Rec Try prednisone 10 mg one half daily to see if affects your ability to walk the mall on 02 or the cough gets worse (walk the mall first for at least before you try) Always walk for exercise on 3lpm - this will help you burn fat and get into negative calorie balance to lose weight.   03/17/2013 f/u ov/[name] re NSIP on floor of 10 mg per day Chief Complaint  Patient presents with  . Follow-up    Breathing is overall doing well and she denies any new co's today.   ex on a treadmill x 36mx 3 x daily on 2lpm not the 3lpm as rec >>Prednisone  ceiling is 20 mg per day and floor is 10 mg per day       02/06/2016  f/u ov/[name] re: NSIP / 3lpm with ex/ duler 100 2bid and pred 10 mg daily  Chief Complaint  Patient presents with  . Follow-up    Pt states that she did have some issues with allergies but is improving. Pt also was having issues with Dulera, jittery after use, but has started rinsing her mouth after use and side effects have improved. Pt denies cough/wheeze/SOB/CP/tightness.   limited by L knee from doing treadmill, has trainer but not losing wt  Rhinitis symptoms better on singulair   rec Work on inhaler technique:    Should be able to tolerate dulera 100 Take 2 puffs first thing in am and then another 2 puffs about 12 hours later.  If so, try prednisone 10 - 5  = even days take a half, odd days take half   06/01/2016  f/u ov/[name] re: NSIP  Chief Complaint  Patient presents with  . Follow-up    Breathing is doing well. She denies any new co's today.   when ex 3lpm and back on treadmill x 25 min more limited by knee 2-3x per week  at 7mh flat / minimal dry daytime cough on nexium 20 mg bid ac  rec Work on inhaler technique:   Continue  dulera 100 Take 2 puffs first thing in am and then another 2 puffs about 12 hours later.  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 Follow-up    Increased cough x 2 wks  on treadmill x 3lpm x 25 min  Decreased pred Nov 13th and cough worse starting on 23rd  Cough worse at hs and early in am but non-productive  Nose feels dried out/ clogged up/ saline helps  rec Stay on singulair each evening Try dulera 200 Take 2 puffs first thing in am and then another 2 puffs about 12 hours later to see if helps or not by the time you return  Protonix 40 mg Take 30- 60 min before your first and last meals of the day  Prednisone 10 mg  daily until better then 10 alternating with 5 mg daily     12/04/2018  f/u ov/[name] re: NSIP maint on pred 5 mg daily  Chief Complaint  Patient presents with  . Follow-up    PFT done today. Breathing is unchanged. She has not had to use her rescue inhaler.   Dyspnea:  Able to do church steps s any 02  which is an improvement  Cough reproducible always with deep breath/ dry  Sleeping: bed flat/ propped up to 30 degrees with pillows SABA use: none 02: 2lpm at hs /  3lpm ex and sats 98% per pt  rec No change    Knee surgery 04/01/2019 Right knee arthroscopic partial medial meniscectomy and medial femoral and trochlear chondroplasty     06/09/2019  f/u ov/[name] re: NSIP/ cough variant asthma on pred 5 mg daily and dulera 10 1 each am  Chief Complaint  Patient presents with  . Follow-up    Breathing is overall doing well. She has occ nasal congestion and cough in the am- relates to allergies. She rarely uses her albuterol.   Dyspnea:  Walking outdoor track total of two laps on 3lpm sats with sats 93-95% knee slows her down 1st  Cough: minimal / dry  Sleeping: 45 degrees with pillows / 2lpm   SABA use: rarely  02: as above rec No change rx   12/08/2019  f/u ov/[name] re: NSIP/ cough variant asthma on duler 100 one twice daily / pred 5 / needs clearance for knee scope [facility] 11/28/19   Chief Complaint  Patient presents with  . Follow-up    Breathing is overall doing well. She has minimal dry cough, sneezing and runny nose- relates to allergies.   Dyspnea:  More limited by L knee torn miniscus for surgery varkey Cough: minimal assoc with pnds/ better on clariton/worse in am Sleeping: 45 degrees  SABA use: proair one day prior but rarely needing  02: 2lpm and not needing daytime rec Call if you want something stronger for your nasal symptoms = dymista one twice (instead of flonase) You are cleared for knee surgery  Make sure you check your oxygen saturations at highest level of  activity      06/16/2020  f/u ov/[name] re: NSIP on pred 5 mg daily with ? Cough variant asthma maint on dulera 100 2bid Chief Complaint  Patient presents with  . Follow-up    ILD, doing well, no issues   Dyspnea:  Knees are better, doing wee step ex x 15 min on 3lpm 02 98% Cough: minimal assoc with pnds/ flonase  Sleeping: flat bed and pillows to 45  SABA use: none   02: 2lpm hs and 3lpm with ex  rec Neg cal bal  12/14/2020  f/u ov/[name] re: NSIP on pred 5 mg daily / cough variant asthma  On dulera 100 2bid  Chief Complaint  Patient presents with  . Follow-up    Breathing is overall doing well.   Dyspnea:  Wee step x 30 min on 3lpm sats 97% vs high 80s  Cough: none  Sleeping: bed is flat/ pillows to 45 degrees/ coughs if flat  SABA use: none  02:  Just using 3lpm with ex  Covid status:   X  3    No obvious day to day or daytime variability or assoc excess/ purulent sputum or mucus plugs or hemoptysis or cp or chest tightness, subjective wheeze or overt sinus or hb symptoms.   Sleeping as above without nocturnal  or early am exacerbation  of respiratory  c/o's or need for noct saba. Also denies any obvious fluctuation of symptoms with weather or environmental changes or other aggravating or alleviating factors except as outlined above   No unusual exposure hx or h/o childhood pna/ asthma or knowledge of premature birth.  Current Allergies, Complete Past Medical History, Past Surgical History, Family History, and Social History were reviewed in [employer] record.  ROS  The following are not active complaints unless bolded Hoarseness, sore throat, dysphagia, dental problems, itching, sneezing,  nasal congestion or discharge of excess mucus or purulent secretions, ear ache,   fever, chills, sweats, unintended wt loss or wt gain, classically pleuritic or exertional cp,  orthopnea pnd or arm/hand swelling  or leg swelling, presyncope, palpitations, abdominal pain,  anorexia, nausea, vomiting, diarrhea  or change in bowel habits or change in bladder habits, change in stools or change in urine, dysuria, hematuria,  rash, arthralgias, visual complaints, headache, numbness, weakness or ataxia or problems with walking or coordination,  change in mood or  memory.        Current Meds  Medication Sig  . Accu-Chek Softclix Lancets lancets Use to check blood sugars twice a day  . albuterol (PROVENTIL HFA;VENTOLIN HFA) 108 (90 Base) MCG/ACT inhaler Inhale 2 puffs into the lungs every 4 (four) hours as needed for wheezing or shortness of breath.  . APPLE CIDER VINEGAR Adkins Take 450 mg by mouth daily.  Marland Kitchen aspirin EC 81 MG tablet Take 81 mg by mouth daily. Swallow whole.  Marland Kitchen atorvastatin (LIPITOR) 20 MG tablet TAKE ONE TABLET BY MOUTH DAILY  . B COMPLEX VITAMINS SL Place 1 mL under the tongue 4 (four) times a week.  . Blood Glucose Monitoring Suppl (ACCU-CHEK AVIVA PLUS) w/Device KIT Use to check blood sugars twice a day  . calcium-vitamin D (OSCAL WITH D) 500-200 MG-UNIT tablet Take 1 tablet by mouth daily.  . celecoxib (CELEBREX) 200 MG capsule TAKE ONE CAPSULE BY MOUTH TWICE A DAY  . Cholecalciferol (VITAMIN D3) 50 MCG (2000 UT) capsule Take 1 capsule (2,000 Units total) by mouth daily.  Marland Kitchen Dextromethorphan-guaiFENesin (MUCINEX DM MAXIMUM STRENGTH) 60-1200 MG TB12 Take 1 tablet by mouth every 12 (twelve) hours as needed (with flutter valve).  . Dulaglutide (TRULICITY) 4.5 VQ/0.0QQ SOPN Inject 4.5 mg as directed once a  week.  Marland Kitchen ELDERBERRY Adkins Take 1,000 mg by mouth daily.  . fluticasone (FLONASE) 50 MCG/ACT nasal spray Place 2 sprays into both nostrils daily.  Marland Kitchen glucose blood (ACCU-CHEK AVIVA PLUS) test strip Use to check blood sugars twice a day  . loratadine (CLARITIN) 10 MG tablet Take 10 mg by mouth at bedtime.  Marland Kitchen losartan (COZAAR) 25 MG tablet TAKE 1/2 TABLET BY MOUTH DAILY  . mometasone-formoterol (DULERA) 100-5 MCG/ACT AERO Inhale 1 puff into the lungs 2 (two) times  daily.   . montelukast (SINGULAIR) 10 MG tablet Take 1 tablet (10 mg total) by mouth at bedtime.  . OXYGEN 2lpm with sleep and 3lpm with exertion  . pantoprazole (PROTONIX) 40 MG tablet TAKE ONE TABLET BY MOUTH TWICE A DAY BEFORE MEALS (Patient taking differently: Take 40 mg by mouth 2 (two) times daily before a meal.)  . predniSONE (DELTASONE) 5 MG tablet TAKE ONE TABLET BY MOUTH DAILY  . repaglinide (PRANDIN) 1 MG tablet Take 1 tablet (1 mg total) by mouth 3 (three) times daily before meals.  Marland Kitchen Respiratory Therapy Supplies (FLUTTER) DEVI Use as directed  . Turmeric 500 MG CAPS Take 500 mg by mouth daily.  Marland Kitchen venlafaxine XR (EFFEXOR-XR) 37.5 MG 24 hr capsule Take 1 capsule (37.5 mg total) by mouth daily with breakfast.  . VITRON-C 65-125 MG TABS Take 1 tablet by mouth daily.                              Past Medical History: GASTROESOPHAGEAL REFLUX DISEASE     - Tapered reglan to 10 mg one half twice daily June 22, 2010 > d/c'd 11/2010  -restarted reglan 03/2011 >>unable to tolerate.  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)   -Restarted Prednisone 02/24/08   -Desats with > 2 laps 06/22/10 so rec wear 02 2lpm at bedtime and with ex  MORBID OBESITY   - Target wt  =  153  for BMI < 30  Hypertension Health Maintenance...........................................................  [place]     - Pneumovax July 22, 2009             Objective:   Physical Exam    12/14/2020   244 06/16/2020   246 12/08/2019     238  06/09/2019     245  wt  305 May 28, 2008 >   302 November 07, 2010 >   303 03/22/2011 > 10/25/2011  299 > 311 04/04/2012 > 306 03/17/2013 >06/15/2013 303 > 10/06/2013 298  > 02/08/2014 289 > 10/06/2014   288 > 03/09/2015 283 > 10/21/2015    283 >  02/06/2016  284 > 06/01/2016  282 > 08/31/2016   281 > 07/12/2017   270 > 11/26/2017   263> 06/30/2018   252 > 12/04/2018  244      Vital signs reviewed   12/14/2020  - Note at rest 02 sats  99% on RA   General appearance:    Obese pleasant bf nad      HEENT : pt wearing mask not removed for exam due to covid -19 concerns.    NECK :  without JVD/Nodes/TM/ nl carotid upstrokes bilaterally   LUNGS: no acc muscle use,  Nl contour chest with min insp crackles in bases and dry cough on deep insp maneuvers  CV:  RRR  no s3 or murmur or increase in P2, and no edema   ABD: obese soft  and nontender with nl inspiratory excursion in the supine position. No bruits or organomegaly appreciated, bowel sounds nl  MS:  Nl gait/ ext warm without deformities, calf tenderness, cyanosis or clubbing No obvious joint restrictions   SKIN: warm and dry without lesions    NEURO:  alert, approp, nl sensorium with  no motor or cerebellar deficits apparent.                  Assessment & Plan:

## 2020-12-14 NOTE — Assessment & Plan Note (Signed)
Body mass index is 47.73 kg/m.  -  trending down slightly , encouraged Lab Results  Component Value Date   TSH 1.25 05/09/2017     Contributing to gerd risk/ doe/reviewed the need and the process to achieve and maintain neg calorie balance > defer f/u primary care including intermittently monitoring thyroid status      Each maintenance medication was reviewed in detail including most importantly the difference between maintenance and as needed and under what circumstances the prns are to be used. This was done in the context of a medication calendar review which provided the patient with a user-friendly unambiguous mechanism for medication administration and reconciliation and provides an action plan for all active problems. It is critical that this be shown to every doctor  for modification during the office visit if necessary so the patient can use it as a working document.             Each maintenance medication was reviewed in detail including emphasizing most importantly the difference between maintenance and prns and under what circumstances the prns are to be triggered using an action plan format where appropriate.  Total time for H and P, chart review, counseling, reviewing hfa device(s) and generating customized AVS unique to this office visit / same day charting = 21 min

## 2020-12-14 NOTE — Patient Instructions (Signed)
See calendar for specific medication instructions and bring it back for each and every office visit for every healthcare provider you see.  Without it,  you may not receive the best quality medical care that we feel you deserve.  You will note that the calendar groups together  your maintenance  medications that are timed at particular times of the day.  Think of this as your checklist for what your doctor has instructed you to do until your next evaluation to see what benefit  there is  to staying on a consistent group of medications intended to keep you well.  The other group at the bottom is entirely up to you to use as you see fit  for specific symptoms that may arise between visits that require you to treat them on an as needed basis.  Think of this as your action plan or "what if" list.   Separating the top medications from the bottom group is fundamental to providing you adequate care going forward.     Keep working on wt loss   In event of flare take prednisone 5 mg x 4  Until better then taper to 1 tablet daily    See Tammy NP w/in 6 months with all your medications, even over the counter meds, separated in two separate bags, the ones you take no matter what vs the ones you stop once you feel better and take only as needed when you feel you need them.   Tammy  will generate for you a new user friendly medication calendar that will put [place] all on the same page re: your medication use.     Without this process, it simply isn't possible to assure that we are providing  your outpatient care  with  the attention to detail we feel you deserve.   If we cannot assure that you're getting that kind of care,  then we cannot manage your problem effectively from this clinic.  Once you have seen Tammy and we are sure that we're all on the same page with your medication use she will arrange follow up with me.

## 2020-12-14 NOTE — Assessment & Plan Note (Signed)
On 02 since 2011  As of 12/14/2020  rx =   prn with activity up to 3lpm   No longer needs at hs as long as keeps hob up 45 degrees and fine at rest with with adls' - in fact only drops at peak ex   Advised goal is > 90% at all times esp with ex to improve cal bal

## 2020-12-14 NOTE — Assessment & Plan Note (Signed)
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)   -Restarted Prednisone 02/24/08   -PFT's December 23, 2008 VC 35%  DLC0 31%   -PFT's 02/08/2014   VC 1.30 (49%) and DLCO is 70%  > rec pred 10/5 > did not tolerate - PFTs  03/09/2015     VC 1.27 (48%) and dlco is 72% @ 10 mg daily  - 02/06/2016 try 10 a/w 5 mg daily > did not try - 06/01/2016 try 10 a/w 5 daily > cough  flared p 1 week on lower dose  -ANA positive 10/2016 >refer to rheumatology  -HRCT chest essentially stable changes since 2005 10/09/2016 >  Rheum eval 12/03/16 [facility]  And f/u q 3 mo as of 06/30/2018   - PFTs 12/04/2018  VC = 1.38 (54%)  And  dlco 13.72 =73% with corrects to 6.5 144 % on pred 5 mg daily = no significant change from prior  Ok flare but able to control with just 5 mg floor dose of pred.  The goal with a chronic steroid dependent illness is always arriving at the lowest effective dose that controls the disease/symptoms and not accepting a set "formula" which is based on statistics or guidelines that don't always take into account patient  variability or the natural hx of the dz in every individual patient, which may well vary over time.  For now therefore I recommend the patient maintain  5 mg floor and 20 mg ceiling per action plan on her med calendar

## 2020-12-24 DIAGNOSIS — R091 Pleurisy: Secondary | ICD-10-CM | POA: Diagnosis not present

## 2020-12-24 DIAGNOSIS — E661 Drug-induced obesity: Secondary | ICD-10-CM | POA: Diagnosis not present

## 2020-12-24 DIAGNOSIS — M722 Plantar fascial fibromatosis: Secondary | ICD-10-CM | POA: Diagnosis not present

## 2020-12-28 ENCOUNTER — Ambulatory Visit: Payer: Medicare PPO | Admitting: Internal Medicine

## 2021-01-09 NOTE — Progress Notes (Signed)
Subjective:    Patient ID: [name], female    DOB: 1970/12/17, 50 y.o.   MRN: [id number]  HPI The patient is here for follow up of their chronic medical problems, including htn, DM, hyperlipidemia, obesity, GERD, anxiety, chronic left knee pain  She is not exercising regularly.   She plans to start with a trainer next week.  Sugars are well controlled at home. Sugars can be in higher 70's in morning.  She usually does not check her sugars during the day.  She sometimes forgets the lunch prandin.    Medications and allergies reviewed with patient and updated if appropriate.  Patient Active Problem List   Diagnosis Date Noted  . Genital herpes 04/11/2020  . History of nephrolithiasis 10/13/2019  . Anxiety 02/05/2018  . Peroneal tendinitis of lower leg, right 10/22/2017  . Chronic respiratory failure with hypoxia ([place]) 07/20/2017  . Knee pain, left 02/02/2016  . Cough variant asthma 03/09/2015  . Diabetes type 2, uncontrolled ([place]) 12/09/2012  . Dyslipidemia   . Acute on chronic respiratory failure with hypoxia ([place]) 04/05/2012  . Allergic rhinitis 01/05/2010  . Essential hypertension 09/07/2009  . URINARY INCONTINENCE, URGE 03/24/2009  . CHRONIC RHINITIS 12/23/2008  . Morbid obesity due to excess calories ([place]) 10/10/2007  . INTERSTITIAL LUNG DISEASE 10/10/2007  . GASTROESOPHAGEAL REFLUX DISEASE 10/10/2007    Current Outpatient Medications on File Prior to Visit  Medication Sig Dispense Refill  . Accu-Chek Softclix Lancets lancets Use to check blood sugars twice a day 100 each 12  . albuterol (PROVENTIL HFA;VENTOLIN HFA) 108 (90 Base) MCG/ACT inhaler Inhale 2 puffs into the lungs every 4 (four) hours as needed for wheezing or shortness of breath. 1 Inhaler 0  . APPLE CIDER VINEGAR PO Take 450 mg by mouth daily.    Marland Kitchen aspirin EC 81 MG tablet Take 81 mg by mouth daily. Swallow whole.    Marland Kitchen atorvastatin (LIPITOR) 20 MG tablet TAKE ONE TABLET BY MOUTH DAILY 90 tablet 1  . B  COMPLEX VITAMINS SL Place 1 mL under the tongue 4 (four) times a week.    . Blood Glucose Monitoring Suppl (ACCU-CHEK AVIVA PLUS) w/Device KIT Use to check blood sugars twice a day 1 kit 0  . calcium-vitamin D (OSCAL WITH D) 500-200 MG-UNIT tablet Take 1 tablet by mouth daily.    . celecoxib (CELEBREX) 200 MG capsule TAKE ONE CAPSULE BY MOUTH TWICE A DAY 60 capsule 5  . Cholecalciferol (VITAMIN D3) 50 MCG (2000 UT) capsule Take 1 capsule (2,000 Units total) by mouth daily. 100 capsule 3  . Dextromethorphan-guaiFENesin (MUCINEX DM MAXIMUM STRENGTH) 60-1200 MG TB12 Take 1 tablet by mouth every 12 (twelve) hours as needed (with flutter valve).    . Dulaglutide (TRULICITY) 4.5 PI/9.5JO SOPN Inject 4.5 mg as directed once a week. 6 mL 1  . ELDERBERRY PO Take 1,000 mg by mouth daily.    . fluticasone (FLONASE) 50 MCG/ACT nasal spray Place 2 sprays into both nostrils daily.    Marland Kitchen glucose blood (ACCU-CHEK AVIVA PLUS) test strip Use to check blood sugars twice a day 100 each 12  . loratadine (CLARITIN) 10 MG tablet Take 10 mg by mouth at bedtime.    Marland Kitchen losartan (COZAAR) 25 MG tablet TAKE 1/2 TABLET BY MOUTH DAILY 45 tablet 1  . mometasone-formoterol (DULERA) 100-5 MCG/ACT AERO Inhale 1 puff into the lungs 2 (two) times daily.     . montelukast (SINGULAIR) 10 MG tablet Take 1 tablet (10  mg total) by mouth at bedtime. 30 tablet 5  . OXYGEN 2lpm with sleep and 3lpm with exertion    . pantoprazole (PROTONIX) 40 MG tablet TAKE ONE TABLET BY MOUTH TWICE A DAY BEFORE MEALS (Patient taking differently: Take 40 mg by mouth 2 (two) times daily before a meal.) 120 tablet 1  . predniSONE (DELTASONE) 5 MG tablet TAKE ONE TABLET BY MOUTH DAILY 90 tablet 1  . repaglinide (PRANDIN) 1 MG tablet Take 1 tablet (1 mg total) by mouth 3 (three) times daily before meals. 90 tablet 11  . Respiratory Therapy Supplies (FLUTTER) DEVI Use as directed 1 each 0  . Turmeric 500 MG CAPS Take 500 mg by mouth daily.    Marland Kitchen venlafaxine XR  (EFFEXOR-XR) 37.5 MG 24 hr capsule Take 1 capsule (37.5 mg total) by mouth daily with breakfast. 90 capsule 2  . VITRON-C 65-125 MG TABS Take 1 tablet by mouth daily.     No current facility-administered medications on file prior to visit.    Past Medical History:  Diagnosis Date  . Allergic rhinitis   . Arthritis    Knees ankles  feet  . Bronchitis   . Chronic rhinitis   . Cough   . Dyslipidemia   . Dysphagia   . Dyspnea    occationally over last 2 years  . GERD (gastroesophageal reflux disease)   . Headache(784.0)   . ILD (interstitial lung disease) ([place])   . Mild depression ([place])   . Morbid obesity ([place])   . Type II or diabetes mellitus, uncontrolled 11/2012 dx   Dx 12/08/12: a1c 10.0  . Unspecified essential hypertension   . Urge incontinence     Past Surgical History:  Procedure Laterality Date  . ABDOMINAL HYSTERECTOMY    . BILATERAL VATS ABLATION  02/15/04  . CESAREAN SECTION    . KNEE ARTHROSCOPY WITH MEDIAL MENISECTOMY Right 04/01/2019   Procedure: KNEE ARTHROSCOPY WITH MEDIAL MENISECTOMY;  Surgeon: Hiram Gash, MD;  Location: [facility];  Service: Orthopedics;  Laterality: Right;  . KNEE ARTHROSCOPY WITH MEDIAL MENISECTOMY Left 01/06/2020   Procedure: KNEE ARTHROSCOPY WITH MEDIAL MENISECTOMY;  Surgeon: Hiram Gash, MD;  Location: [facility];  Service: Orthopedics;  Laterality: Left;  . LUNG BIOPSY    . RIGHT HEART CATH N/A 01/30/2017   Procedure: Right Heart Cath;  Surgeon: Larey Dresser, MD;  Location: [facility];  Service: Cardiovascular;  Laterality: N/A;    Social History   Socioeconomic History  . Marital status: Married    Spouse name: Not on file  . Number of children: 1  . Years of education: Not on file  . Highest education level: Not on file  Occupational History  . 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 Smoking status: Never Smoker  . Smokeless tobacco: Never Used  .  Tobacco comment: {  Vaping Use  . Vaping Use: Never used  Substance and Sexual Activity  . Alcohol use: No    Alcohol/week: 0.0 standard drinks  . Drug use: No  . Sexual activity: Never  Other Topics Concern  . Not on file  Social History Narrative  . Not on file   Social Determinants of Health   Financial Resource Strain: Low Risk   . Difficulty of Paying Living Expenses: Not hard at all  Food Insecurity: No Food Insecurity  . Worried About Charity fundraiser  in the Last Year: Never true  . Ran Out of Food in the Last Year: Never true  Transportation Needs: No Transportation Needs  . Lack of Transportation (Medical): No  . Lack of Transportation (Non-Medical): No  Physical Activity: Not on file  Stress: No Stress Concern Present  . Feeling of Stress : Not at all  Social Connections: Socially Integrated  . Frequency of Communication with Friends and Family: More than three times a week  . Frequency of Social Gatherings with Friends and Family: Once a week  . Attends Religious Services: More than 4 times per year  . Active Member of Clubs or Organizations: Yes  . Attends Archivist Meetings: More than 4 times per year  . Marital Status: Married    Family History  Problem Relation Age of Onset  . Sarcoidosis Mother        dxed by transbrochial bx  . Allergies Mother   . Heart disease Father   . Diabetes Father   . Allergies Father   . Coronary artery disease Father   . Cancer Maternal Grandmother        CA of unknown type  . Cancer Paternal Grandmother        CA of unknown type    Review of Systems  Constitutional: Negative for fever.  HENT: Positive for postnasal drip.   Respiratory: Positive for cough (occ with allergies). Negative for wheezing.   Cardiovascular: Negative for palpitations and leg swelling.  Musculoskeletal: Positive for arthralgias.  Neurological: Positive for light-headedness (occ) and headaches (occ, sinus related).        Objective:   Vitals:   01/10/21 0751  BP: 126/72  Pulse: 86  Temp: 98.4 F (36.9 C)  SpO2: 98%   BP Readings from Last 3 Encounters:  01/10/21 126/72  12/14/20 126/74  09/28/20 120/78   Wt Readings from Last 3 Encounters:  01/10/21 248 lb (112.5 kg)  12/14/20 244 lb 6.4 oz (110.9 kg)  09/28/20 239 lb (108.4 kg)   Body mass index is 48.43 kg/m.   Physical Exam    Constitutional: Appears well-developed and well-nourished. No distress.  HENT:  Head: Normocephalic and atraumatic.  Neck: Neck supple. No tracheal deviation present. No thyromegaly present.  No cervical lymphadenopathy Cardiovascular: Normal rate, regular rhythm and normal heart sounds.   No murmur heard. No carotid bruit .  No edema Pulmonary/Chest: Effort normal and breath sounds normal. No respiratory distress. No has no wheezes. No rales.  Skin: Skin is warm and dry. Not diaphoretic.  Psychiatric: Normal mood and affect. Behavior is normal.    Diabetic Foot Exam - Simple   Simple Foot Form Diabetic Foot exam was performed with the following findings: Yes 01/10/2021  8:16 AM  Visual Inspection No deformities, no ulcerations, no other skin breakdown bilaterally: Yes Sensation Testing Intact to touch and monofilament testing bilaterally: Yes Pulse Check Posterior Tibialis and Dorsalis pulse intact bilaterally: Yes Comments Mild callus formation b/l first medial toes      Assessment & Plan:    See Problem List for Assessment and Plan of chronic medical problems.    This visit occurred during the SARS-CoV-2 public health emergency.  Safety protocols were in place, including screening questions prior to the visit, additional usage of staff PPE, and extensive cleaning of exam room while observing appropriate contact time as indicated for disinfecting solutions.

## 2021-01-09 NOTE — Patient Instructions (Addendum)
  Blood work was ordered.     Medications changes include :   none  Your prescription(s) have been submitted to your pharmacy. Please take as directed and contact our office if you believe you are having problem(s) with the medication(s).   A referral was ordered for GI.     Please followup in 3 months

## 2021-01-10 ENCOUNTER — Other Ambulatory Visit: Payer: Self-pay

## 2021-01-10 ENCOUNTER — Encounter: Payer: Self-pay | Admitting: Internal Medicine

## 2021-01-10 ENCOUNTER — Ambulatory Visit: Payer: Medicare PPO | Admitting: Internal Medicine

## 2021-01-10 VITALS — BP 126/72 | HR 86 | Temp 98.4°F | Ht 60.0 in | Wt 248.0 lb

## 2021-01-10 DIAGNOSIS — E785 Hyperlipidemia, unspecified: Secondary | ICD-10-CM

## 2021-01-10 DIAGNOSIS — F419 Anxiety disorder, unspecified: Secondary | ICD-10-CM

## 2021-01-10 DIAGNOSIS — E1165 Type 2 diabetes mellitus with hyperglycemia: Secondary | ICD-10-CM | POA: Diagnosis not present

## 2021-01-10 DIAGNOSIS — I10 Essential (primary) hypertension: Secondary | ICD-10-CM | POA: Diagnosis not present

## 2021-01-10 DIAGNOSIS — Z1211 Encounter for screening for malignant neoplasm of colon: Secondary | ICD-10-CM | POA: Diagnosis not present

## 2021-01-10 DIAGNOSIS — M25562 Pain in left knee: Secondary | ICD-10-CM | POA: Diagnosis not present

## 2021-01-10 DIAGNOSIS — G8929 Other chronic pain: Secondary | ICD-10-CM | POA: Diagnosis not present

## 2021-01-10 DIAGNOSIS — K219 Gastro-esophageal reflux disease without esophagitis: Secondary | ICD-10-CM

## 2021-01-10 LAB — HEMOGLOBIN A1C: Hgb A1c MFr Bld: 7.6 % — ABNORMAL HIGH (ref 4.6–6.5)

## 2021-01-10 LAB — COMPREHENSIVE METABOLIC PANEL
ALT: 15 U/L (ref 0–35)
AST: 17 U/L (ref 0–37)
Albumin: 3.6 g/dL (ref 3.5–5.2)
Alkaline Phosphatase: 75 U/L (ref 39–117)
BUN: 10 mg/dL (ref 6–23)
CO2: 29 mEq/L (ref 19–32)
Calcium: 9.1 mg/dL (ref 8.4–10.5)
Chloride: 101 mEq/L (ref 96–112)
Creatinine, Ser: 0.54 mg/dL (ref 0.40–1.20)
GFR: 108.59 mL/min (ref >60.00)
Glucose, Bld: 100 mg/dL — ABNORMAL HIGH (ref 70–99)
Potassium: 3.6 mEq/L (ref 3.5–5.1)
Sodium: 138 mEq/L (ref 135–145)
Total Bilirubin: 0.3 mg/dL (ref 0.2–1.2)
Total Protein: 7.1 g/dL (ref 6.0–8.3)

## 2021-01-10 LAB — LIPID PANEL
Cholesterol: 157 mg/dL (ref 0–200)
HDL: 47.5 mg/dL (ref >39.00)
LDL Cholesterol: 97 mg/dL (ref 0–99)
NonHDL: 109.75
Total CHOL/HDL Ratio: 3
Triglycerides: 62 mg/dL (ref 0.0–149.0)
VLDL: 12.4 mg/dL (ref 0.0–40.0)

## 2021-01-10 NOTE — Assessment & Plan Note (Addendum)
Chronic Not controlled, but sugars recently at home in high 70's in morning Continue trulicity 4.5 mg weekly Continue prandin 1mg  TID ac started 08/2020 by Dr Charlean Merl was too expensive Check a1c

## 2021-01-10 NOTE — Assessment & Plan Note (Addendum)
Chronic Has increased stress, but doing ok Controlled, stable Continue effexor 37.5 mg daily

## 2021-01-10 NOTE — Assessment & Plan Note (Signed)
Chronic Check lipid panel  Continue atorvastatin 20 mg daily Regular exercise and healthy diet encouraged  

## 2021-01-10 NOTE — Assessment & Plan Note (Signed)
Chronic BP well controlled Continue losartan 12.5 mg daily cmp

## 2021-01-10 NOTE — Assessment & Plan Note (Addendum)
Chronic Continue celebrex 200 mg bid prn cmp

## 2021-01-10 NOTE — Assessment & Plan Note (Signed)
Chronic GERD controlled Continue pantoprazole 40 mg bid  

## 2021-01-23 ENCOUNTER — Encounter: Payer: Self-pay | Admitting: Gastroenterology

## 2021-01-23 ENCOUNTER — Encounter: Payer: Self-pay | Admitting: Internal Medicine

## 2021-01-24 DIAGNOSIS — R091 Pleurisy: Secondary | ICD-10-CM | POA: Diagnosis not present

## 2021-01-24 DIAGNOSIS — M722 Plantar fascial fibromatosis: Secondary | ICD-10-CM | POA: Diagnosis not present

## 2021-01-24 DIAGNOSIS — E661 Drug-induced obesity: Secondary | ICD-10-CM | POA: Diagnosis not present

## 2021-01-24 MED ORDER — DAPAGLIFLOZIN PROPANEDIOL 5 MG PO TABS
5.0000 mg | ORAL_TABLET | Freq: Every day | ORAL | 5 refills | Status: DC
Start: 2021-01-24 — End: 2021-01-28

## 2021-01-24 NOTE — Addendum Note (Signed)
Addended by: Binnie Rail on: 01/24/2021 07:53 AM   Modules accepted: Orders

## 2021-01-28 MED ORDER — GLIMEPIRIDE 2 MG PO TABS: 2.0000 mg | ORAL_TABLET | Freq: Every day | ORAL | 3 refills | Status: DC

## 2021-01-28 NOTE — Addendum Note (Signed)
Addended by: Binnie Rail on: 01/28/2021 05:39 PM   Modules accepted: Orders

## 2021-02-12 ENCOUNTER — Other Ambulatory Visit: Payer: Self-pay | Admitting: Internal Medicine

## 2021-02-14 ENCOUNTER — Encounter: Payer: Medicare PPO | Admitting: Internal Medicine

## 2021-02-18 ENCOUNTER — Other Ambulatory Visit: Payer: Self-pay | Admitting: Internal Medicine

## 2021-02-23 DIAGNOSIS — R091 Pleurisy: Secondary | ICD-10-CM | POA: Diagnosis not present

## 2021-02-23 DIAGNOSIS — M722 Plantar fascial fibromatosis: Secondary | ICD-10-CM | POA: Diagnosis not present

## 2021-02-23 DIAGNOSIS — E661 Drug-induced obesity: Secondary | ICD-10-CM | POA: Diagnosis not present

## 2021-03-04 ENCOUNTER — Other Ambulatory Visit: Payer: Self-pay | Admitting: Internal Medicine

## 2021-03-05 ENCOUNTER — Encounter: Payer: Self-pay | Admitting: Internal Medicine

## 2021-03-05 MED ORDER — FLUCONAZOLE 150 MG PO TABS: 150.0000 mg | ORAL_TABLET | Freq: Once | ORAL | 0 refills | Status: AC

## 2021-03-15 ENCOUNTER — Other Ambulatory Visit: Payer: Self-pay | Admitting: Internal Medicine

## 2021-03-17 ENCOUNTER — Telehealth: Payer: Self-pay | Admitting: *Deleted

## 2021-03-17 NOTE — Telephone Encounter (Signed)
Patient uses Oxygen at home per our protocol she will need an office visit before the colonoscopy. Called patient, no answer, left a message for the patient to call us back to make an OV.

## 2021-03-20 NOTE — Telephone Encounter (Signed)
Patient made Office visit  with Nevin Bloodgood, NP on 04/19/2021

## 2021-03-20 NOTE — Telephone Encounter (Signed)
Dr.Beavers,  Patient is for a direct screening colonoscopy. She is on Oxygen at home. [facility] for direct hospital or would you like an office visit first? Thank you, Lenya Sterne pv

## 2021-03-20 NOTE — Telephone Encounter (Signed)
Patient returned the call and was not happy she said she already knows she should be done at a hospital settings she always does. She is asking if we can just send notification to Dr. Melvyn Novas from [facility] to start the clearance process. She is disappointed because she had to arrange with someone else for transportation and now its all canceled would like to get something for 04/24/21 if possible.

## 2021-03-20 NOTE — Telephone Encounter (Signed)
Called patient, no answer, left a message

## 2021-03-22 NOTE — Telephone Encounter (Signed)
Spoke with patient. She wants to cancel OV with Nevin Bloodgood. Patient aware she will be made direct hospital colonoscopy and she will need PV (phone PV).

## 2021-03-24 ENCOUNTER — Other Ambulatory Visit: Payer: Self-pay

## 2021-03-24 DIAGNOSIS — Z1211 Encounter for screening for malignant neoplasm of colon: Secondary | ICD-10-CM

## 2021-03-24 NOTE — Telephone Encounter (Signed)
Pt scheduled for phone previsit 04/05/21 at 8:30am. Colon scheduled at [facility] 04/17/21@9 :30am. Pt to arrive at [facility] admitting at 8am. Pt aware of appts.

## 2021-03-26 DIAGNOSIS — M722 Plantar fascial fibromatosis: Secondary | ICD-10-CM | POA: Diagnosis not present

## 2021-03-26 DIAGNOSIS — R091 Pleurisy: Secondary | ICD-10-CM | POA: Diagnosis not present

## 2021-03-26 DIAGNOSIS — E661 Drug-induced obesity: Secondary | ICD-10-CM | POA: Diagnosis not present

## 2021-03-27 DIAGNOSIS — Z9981 Dependence on supplemental oxygen: Secondary | ICD-10-CM | POA: Insufficient documentation

## 2021-03-30 DIAGNOSIS — U071 COVID-19: Secondary | ICD-10-CM | POA: Diagnosis not present

## 2021-04-03 ENCOUNTER — Encounter: Payer: Self-pay | Admitting: Internal Medicine

## 2021-04-03 NOTE — Telephone Encounter (Signed)
FYI for MW   [place], [facility] I would like for this message to be notated in my medical records. I am sending it to you and Dr. Quay Burow. On Tuesday, 03/28/21 I began having a dry hacking cough. On Thursday, 03/30/21 after a night of coughing, bringing up brownish, reddish mucus and a positive at home Covid test, I went to [facility] in [place], [place].  There they did another Covid test that confirmed I was positive for Covid. I was prescribed PAXLOVID TABLETS (STANDARD DOSE) FOR 5 DAYS. In addition I increased my prednisone on that Tuesday to 20mg  per day. I begin taking Delsym, Tylenol and Mucinex DM. As of today I am continuing to take the OTC meds. Just wanted you all to know in case I needadditional care. I am feeling much better thankfully.

## 2021-04-05 ENCOUNTER — Ambulatory Visit (AMBULATORY_SURGERY_CENTER): Payer: Medicare PPO

## 2021-04-05 ENCOUNTER — Other Ambulatory Visit: Payer: Self-pay

## 2021-04-05 VITALS — Ht 60.0 in | Wt 250.8 lb

## 2021-04-05 DIAGNOSIS — Z9981 Dependence on supplemental oxygen: Secondary | ICD-10-CM

## 2021-04-05 DIAGNOSIS — Z1211 Encounter for screening for malignant neoplasm of colon: Secondary | ICD-10-CM

## 2021-04-05 MED ORDER — NA SULFATE-K SULFATE-MG SULF 17.5-3.13-1.6 GM/177ML PO SOLN: 1.0000 | Freq: Once | ORAL | 0 refills | Status: AC

## 2021-04-05 NOTE — Progress Notes (Signed)
TELEVISIT  No egg or soy allergy known to patient  No issues with past sedation with any surgeries or procedures Patient denies ever being told they had issues or difficulty with intubation  No FH of Malignant Hyperthermia No diet pills per patient No home 02 use per patient  No blood thinners per patient  Pt denies issues with constipation  No A fib or A flutter  EMMI video to pt or via MyChart  Pt is fully vaccinated  for Covid   Due to the COVID-19 pandemic we are asking patients to follow certain guidelines.  Pt aware of COVID protocols and [facility] guidelines

## 2021-04-11 ENCOUNTER — Encounter (HOSPITAL_COMMUNITY): Payer: Self-pay | Admitting: Gastroenterology

## 2021-04-11 ENCOUNTER — Other Ambulatory Visit: Payer: Self-pay

## 2021-04-12 ENCOUNTER — Ambulatory Visit: Payer: Medicare PPO | Admitting: Internal Medicine

## 2021-04-17 ENCOUNTER — Ambulatory Visit (HOSPITAL_COMMUNITY): Payer: Medicare PPO | Admitting: Anesthesiology

## 2021-04-17 ENCOUNTER — Encounter (HOSPITAL_COMMUNITY)
Admission: RE | Disposition: A | Discharge status: Home / Self Care | Payer: Self-pay | Source: Home / Self Care | Attending: Gastroenterology

## 2021-04-17 ENCOUNTER — Other Ambulatory Visit: Payer: Self-pay

## 2021-04-17 ENCOUNTER — Encounter (HOSPITAL_COMMUNITY): Payer: Self-pay | Admitting: Gastroenterology

## 2021-04-17 ENCOUNTER — Ambulatory Visit (HOSPITAL_COMMUNITY)
Admission: RE | Admit: 2021-04-17 | Discharge: 2021-04-17 | Disposition: A | Discharge status: Home / Self Care | Payer: Medicare PPO | Attending: Gastroenterology | Admitting: Gastroenterology

## 2021-04-17 DIAGNOSIS — Z98891 History of uterine scar from previous surgery: Secondary | ICD-10-CM | POA: Diagnosis not present

## 2021-04-17 DIAGNOSIS — K5730 Diverticulosis of large intestine without perforation or abscess without bleeding: Secondary | ICD-10-CM | POA: Diagnosis not present

## 2021-04-17 DIAGNOSIS — Z1211 Encounter for screening for malignant neoplasm of colon: Secondary | ICD-10-CM | POA: Insufficient documentation

## 2021-04-17 DIAGNOSIS — Z888 Allergy status to other drugs, medicaments and biological substances status: Secondary | ICD-10-CM | POA: Diagnosis not present

## 2021-04-17 DIAGNOSIS — Z881 Allergy status to other antibiotic agents status: Secondary | ICD-10-CM | POA: Diagnosis not present

## 2021-04-17 DIAGNOSIS — Z90710 Acquired absence of both cervix and uterus: Secondary | ICD-10-CM | POA: Insufficient documentation

## 2021-04-17 DIAGNOSIS — D123 Benign neoplasm of transverse colon: Secondary | ICD-10-CM

## 2021-04-17 DIAGNOSIS — I10 Essential (primary) hypertension: Secondary | ICD-10-CM | POA: Diagnosis not present

## 2021-04-17 DIAGNOSIS — K648 Other hemorrhoids: Secondary | ICD-10-CM | POA: Insufficient documentation

## 2021-04-17 DIAGNOSIS — Z6841 Body Mass Index (BMI) 40.0 and over, adult: Secondary | ICD-10-CM | POA: Insufficient documentation

## 2021-04-17 DIAGNOSIS — K635 Polyp of colon: Secondary | ICD-10-CM | POA: Diagnosis not present

## 2021-04-17 DIAGNOSIS — Z880 Allergy status to penicillin: Secondary | ICD-10-CM | POA: Insufficient documentation

## 2021-04-17 DIAGNOSIS — Z8249 Family history of ischemic heart disease and other diseases of the circulatory system: Secondary | ICD-10-CM | POA: Diagnosis not present

## 2021-04-17 DIAGNOSIS — E785 Hyperlipidemia, unspecified: Secondary | ICD-10-CM | POA: Diagnosis not present

## 2021-04-17 HISTORY — PX: BIOPSY: SHX5522

## 2021-04-17 HISTORY — PX: COLONOSCOPY WITH PROPOFOL: SHX5780

## 2021-04-17 LAB — GLUCOSE, CAPILLARY
Glucose-Capillary: 134 mg/dL — ABNORMAL HIGH (ref 70–99)
Glucose-Capillary: 74 mg/dL (ref 70–99)

## 2021-04-17 SURGERY — COLONOSCOPY WITH PROPOFOL
Anesthesia: Monitor Anesthesia Care

## 2021-04-17 MED ORDER — LIDOCAINE 2% (20 MG/ML) 5 ML SYRINGE
INTRAMUSCULAR | Status: DC | PRN
  Administered 2021-04-17: 60 mg via INTRAVENOUS

## 2021-04-17 MED ORDER — SODIUM CHLORIDE 0.9 % IV SOLN: INTRAVENOUS | Status: DC

## 2021-04-17 MED ORDER — PROPOFOL 10 MG/ML IV BOLUS
INTRAVENOUS | Status: DC | PRN
  Administered 2021-04-17: 20 mg via INTRAVENOUS

## 2021-04-17 MED ORDER — LACTATED RINGERS IV SOLN: INTRAVENOUS | Status: DC

## 2021-04-17 MED ORDER — PROPOFOL 500 MG/50ML IV EMUL
INTRAVENOUS | Status: AC
  Filled 2021-04-17: qty 50

## 2021-04-17 MED ORDER — PROPOFOL 500 MG/50ML IV EMUL
INTRAVENOUS | Status: DC | PRN
  Administered 2021-04-17: 125 ug/kg/min via INTRAVENOUS

## 2021-04-17 SURGICAL SUPPLY — 22 items

## 2021-04-17 NOTE — Transfer of Care (Signed)
Immediate Anesthesia Transfer of Care Note  Patient: [name]  Procedure(s) Performed: COLONOSCOPY WITH PROPOFOL BIOPSY  Patient Location: PACU and Endoscopy Unit  Anesthesia Type:MAC  Level of Consciousness: awake, drowsy and patient cooperative  Airway & Oxygen Therapy: Patient Spontanous Breathing and Patient connected to face mask oxygen  Post-op Assessment: Report given to RN and Post -op Vital signs reviewed and stable  Post vital signs: Reviewed and stable  Last Vitals:  Vitals Value Taken Time  BP    Temp    Pulse    Resp 13 04/17/21 1029  SpO2    Vitals shown include unvalidated device data.  Last Pain:  Vitals:   04/17/21 0847  TempSrc: Temporal  PainSc: 0-No pain         Complications: No notable events documented.

## 2021-04-17 NOTE — Op Note (Signed)
[facility] Patient Name: [name] Procedure Date: 04/17/2021 MRN: [id number] Attending MD: Thornton Park MD, MD Date of Birth: 1971-08-03 CSN: [id number] Age: 50 Admit Type: Outpatient Procedure:                Colonoscopy Indications:              Screening for colorectal malignant neoplasm, This                            is the patient's first colonoscopy                           No known family history of colon cancer or polyps Providers:                Thornton Park MD, MD, Doristine Johns, RN,                            [place], Benetta Spar, Technician Referring MD:              Medicines:                Monitored Anesthesia Care Complications:            No immediate complications. Estimated blood loss:                            None. Estimated Blood Loss:     Estimated blood loss: none. Procedure:                Pre-Anesthesia Assessment:                           - Prior to the procedure, a History and Physical                            was performed, and patient medications and                            allergies were reviewed. The patient's tolerance of                            previous anesthesia was also reviewed. The risks                            and benefits of the procedure and the sedation                            options and risks were discussed with the patient.                            All questions were answered, and informed consent                            was obtained. Prior Anticoagulants: The patient has                            taken  no previous anticoagulant or antiplatelet                            agents. ASA Grade Assessment: III - A patient with                            severe systemic disease. After reviewing the risks                            and benefits, the patient was deemed in                            satisfactory condition to undergo the procedure.                           After  obtaining informed consent, the colonoscope                            was passed under direct vision. Throughout the                            procedure, the patient's blood pressure, pulse, and                            oxygen saturations were monitored continuously. The                            [id number]-[id number] ([id number] Olympus pediatric colonscope                            was introduced through the anus and advanced to the                            the cecum, identified by appendiceal orifice and                            ileocecal valve. A second forward view of the right                            colon was performed. The colonoscopy was                            technically difficult and complex due to                            significant looping. Successful completion of the                            procedure was aided by withdrawing and reinserting                            the scope. The patient tolerated the procedure  well. The quality of the bowel preparation was                            good. The ileocecal valve, appendiceal orifice, and                            rectum were photographed. Scope In: 10:08:49 AM Scope Out: 10:27:29 AM Scope Withdrawal Time: 0 hours 12 minutes 37 seconds  Total Procedure Duration: 0 hours 18 minutes 40 seconds  Findings:      The perianal and digital rectal examinations were normal.      Non-bleeding internal hemorrhoids were found.      Multiple small and large-mouthed diverticula were found in the sigmoid       colon.      A 1 mm polyp was found in the transverse colon. The polyp was flat. The       polyp was removed with a cold biopsy forceps. Resection and retrieval       were complete. Estimated blood loss: none.      The exam was otherwise without abnormality on direct and retroflexion       views. Impression:               - Non-bleeding internal hemorrhoids.                           -  Diverticulosis in the sigmoid colon.                           - One 1 mm polyp in the transverse colon, removed                            with a cold biopsy forceps. Resected and retrieved.                           - The examination was otherwise normal on direct                            and retroflexion views. Moderate Sedation:      Not Applicable - Patient had care per Anesthesia. Recommendation:           - Patient has a contact number available for                            emergencies. The signs and symptoms of potential                            delayed complications were discussed with the                            patient. Return to normal activities tomorrow.                            Written discharge instructions were provided to the                            patient.                           -  Resume previous diet. High fiber diet encouraged.                           - Continue present medications.                           - Await pathology results.                           - Repeat colonoscopy date to be determined after                            pending pathology results are reviewed for                            surveillance.                           - Emerging evidence supports eating a diet of                            fruits, vegetables, grains, calcium, and yogurt                            while reducing red meat and alcohol may reduce the                            risk of colon cancer.                           - Thank you for allowing me to be involved in your                            colon cancer prevention. Procedure Code(s):        --- Professional ---                           [id number], Colonoscopy, flexible; with biopsy, single                            or multiple Diagnosis Code(s):        --- Professional ---                           Z12.11, Encounter for screening for malignant                            neoplasm of colon                            K64.8, Other hemorrhoids                           K63.5, Polyp of colon                           K57.30, Diverticulosis of large  intestine without                            perforation or abscess without bleeding CPT copyright 2019 American Medical Association. All rights reserved. The codes documented in this report are preliminary and upon coder review may  be revised to meet current compliance requirements. Thornton Park MD, MD 04/17/2021 10:33:25 AM This report has been signed electronically. Number of Addenda: 0

## 2021-04-17 NOTE — Discharge Instructions (Signed)

## 2021-04-17 NOTE — Anesthesia Procedure Notes (Signed)
Procedure Name: MAC Date/Time: 04/17/2021 10:02 AM Performed by: Lollie Sails, CRNA Pre-anesthesia Checklist: Patient identified, Emergency Drugs available, Suction available, Patient being monitored and Timeout performed Oxygen Delivery Method: Simple face mask

## 2021-04-17 NOTE — H&P (Signed)
Referring Provider: No ref. provider found Primary Care Physician:  Binnie Rail, MD  Reason for Consultation:  Colon cancer screening   IMPRESSION:  Need for colon cancer screening No known family history of colon cancer or polyps  PLAN: Colonoscopy  Please see the "Patient Instructions" section for addition details about the plan.  HPI: [name] is a 50 y.o. female presents for colon cancer screening.  No prior endoscopy or colon cancer screening.   No known family history of colon cancer or polyps. No family history of uterine/endometrial cancer, pancreatic cancer or gastric/stomach cancer.  GI ROS is negative.   Past Medical History:  Diagnosis Date   Allergic rhinitis    Arthritis    Knees ankles  feet   Bronchitis    Chronic rhinitis    Cough    Dyslipidemia    Dysphagia    Dyspnea    occationally over last 2 years   Fibromyalgia    GERD (gastroesophageal reflux disease)    Headache(784.0)    Hyperlipidemia    ILD (interstitial lung disease) ([place])    Mild depression ([place])    Morbid obesity ([name])    Type II or diabetes mellitus, uncontrolled 11/2012 dx   Dx 12/08/12: a1c 10.0   Unspecified essential hypertension    Urge incontinence     Past Surgical History:  Procedure Laterality Date   ABDOMINAL HYSTERECTOMY     BILATERAL VATS ABLATION  02/15/04   CESAREAN SECTION     KNEE ARTHROSCOPY WITH MEDIAL MENISECTOMY Right 04/01/2019   Procedure: KNEE ARTHROSCOPY WITH MEDIAL MENISECTOMY;  Surgeon: Hiram Gash, MD;  Location: [facility];  Service: Orthopedics;  Laterality: Right;   KNEE ARTHROSCOPY WITH MEDIAL MENISECTOMY Left 01/06/2020   Procedure: KNEE ARTHROSCOPY WITH MEDIAL MENISECTOMY;  Surgeon: Hiram Gash, MD;  Location: [facility];  Service: Orthopedics;  Laterality: Left;   LUNG BIOPSY     RIGHT HEART CATH N/A 01/30/2017   Procedure: Right Heart Cath;  Surgeon: Larey Dresser, MD;  Location: [facility];  Service: Cardiovascular;  Laterality:  N/A;    Current Facility-Administered Medications  Medication Dose Route Frequency Provider Last Rate Last Admin   0.9 %  sodium chloride infusion   Intravenous Continuous Thornton Park, MD        Allergies as of 03/24/2021 - Review Complete 01/10/2021  Allergen Reaction Noted   Penicillins Shortness Of Breath, Swelling, and Other (See Comments) 10/16/2007   Metformin and related Other (See Comments) 09/22/2020   Peach flavor Hives and Other (See Comments) 11/06/2016   Levaquin [levofloxacin] Nausea Only 05/09/2017    Family History  Problem Relation Age of Onset   Sarcoidosis Mother        dxed by transbrochial bx   Allergies Mother    Heart disease Father    Diabetes Father    Allergies Father    Coronary artery disease Father    Cancer Maternal Grandmother        CA of unknown type   Cancer Paternal Grandmother        CA of unknown type   Colon cancer Neg Hx    Esophageal cancer Neg Hx    Rectal cancer Neg Hx    Stomach cancer Neg Hx       Physical Exam: General:   Alert,  well-nourished, pleasant and cooperative in NAD Head:  Normocephalic and atraumatic. Eyes:  Sclera clear, no icterus.   Conjunctiva pink. Ears:  Normal auditory  acuity. Nose:  No deformity, discharge,  or lesions. Mouth:  No deformity or lesions.   Neck:  Supple; no masses or thyromegaly. Lungs:  Clear throughout to auscultation.   No wheezes. Heart:  Regular rate and rhythm; no murmurs. Abdomen:  Soft, nontender, nondistended, normal bowel sounds, no rebound or guarding. No hepatosplenomegaly.   Rectal:  Deferred  Msk:  Symmetrical. No boney deformities LAD: No inguinal or umbilical LAD Extremities:  No clubbing or edema. Neurologic:  Alert and  oriented x4;  grossly nonfocal Skin:  Intact without significant lesions or rashes. Psych:  Alert and cooperative. Normal mood and affect.     Bron Snellings L. Tarri Glenn, MD, MPH 04/17/2021, 8:41 AM

## 2021-04-17 NOTE — Anesthesia Preprocedure Evaluation (Addendum)
Anesthesia Evaluation  Patient identified by MRN, date of birth, ID band Patient awake    Reviewed: Allergy & Precautions, NPO status , Patient's Chart, lab work & pertinent test results  Airway Mallampati: I  TM Distance: >3 FB Neck ROM: Full    Dental  (+) Teeth Intact, Dental Advisory Given   Pulmonary asthma ,  ILD-2L O2 _0    Pulmonary exam normal breath sounds clear to auscultation       Cardiovascular hypertension, Normal cardiovascular exam Rhythm:Regular Rate:Normal     Neuro/Psych  Headaches, PSYCHIATRIC DISORDERS Anxiety Depression  Neuromuscular disease    GI/Hepatic Neg liver ROS, GERD  Medicated,  Endo/Other  diabetes, Type 2, Oral Hypoglycemic AgentsMorbid obesity  Renal/GU negative Renal ROS     Musculoskeletal  (+) Arthritis , Fibromyalgia -  Abdominal   Peds  Hematology negative hematology ROS (+)   Anesthesia Other Findings Day of surgery medications reviewed with the patient.  Reproductive/Obstetrics                            Anesthesia Physical Anesthesia Plan  ASA: 3  Anesthesia Plan: MAC   Post-op Pain Management:    Induction: Intravenous  PONV Risk Score and Plan: 2 and Propofol infusion and Treatment may vary due to age or medical condition  Airway Management Planned: Nasal Cannula  Additional Equipment:   Intra-op Plan:   Post-operative Plan:   Informed Consent: I have reviewed the patients History and Physical, chart, labs and discussed the procedure including the risks, benefits and alternatives for the proposed anesthesia with the patient or authorized representative who has indicated his/her understanding and acceptance.     Dental advisory given  Plan Discussed with: CRNA and Anesthesiologist  Anesthesia Plan Comments:         Anesthesia Quick Evaluation

## 2021-04-17 NOTE — Anesthesia Postprocedure Evaluation (Signed)
Anesthesia Post Note  Patient: [name]  Procedure(s) Performed: COLONOSCOPY WITH PROPOFOL BIOPSY     Patient location during evaluation: Endoscopy Anesthesia Type: MAC Level of consciousness: awake and alert Pain management: pain level controlled Vital Signs Assessment: post-procedure vital signs reviewed and stable Respiratory status: spontaneous breathing, nonlabored ventilation, respiratory function stable and patient connected to nasal cannula oxygen Cardiovascular status: stable and blood pressure returned to baseline Postop Assessment: no apparent nausea or vomiting Anesthetic complications: no   No notable events documented.  Last Vitals:  Vitals:   04/17/21 1040 04/17/21 1050  BP: 134/85   Pulse: 89 86  Resp: (!) 25 (!) 23  Temp:    SpO2: 100% 100%    Last Pain:  Vitals:   04/17/21 1050  TempSrc:   PainSc: 0-No pain                 Catalina Gravel

## 2021-04-18 ENCOUNTER — Encounter (HOSPITAL_COMMUNITY): Payer: Self-pay | Admitting: Gastroenterology

## 2021-04-19 ENCOUNTER — Ambulatory Visit: Payer: Medicare PPO | Admitting: Nurse Practitioner

## 2021-04-19 LAB — SURGICAL PATHOLOGY

## 2021-04-21 ENCOUNTER — Other Ambulatory Visit: Payer: Self-pay | Admitting: Internal Medicine

## 2021-04-21 MED ORDER — PREDNISONE 5 MG PO TABS: 5.0000 mg | ORAL_TABLET | Freq: Every day | ORAL | 2 refills | Status: DC

## 2021-04-21 NOTE — Telephone Encounter (Signed)
That's fine, go ahead and refill it x 2 and let her know to taper like she usually does back to the prior floor

## 2021-04-21 NOTE — Telephone Encounter (Signed)
Dr. Melvyn Novas, please advise on mychart message below, patient has increased pred to '20mg'$  daily from '5mg'$  since having covid last week and requesting refill and wanted to make sure it is ok. Thanks!  Good morning. Can you please send over refills for Prednisone '5mg'$  to [facility] on [address]? I had to increase my dosage during my bout with Covid. Thanks.Marland KitchenMarland KitchenMarland KitchenHave a good weekend!

## 2021-04-24 ENCOUNTER — Encounter: Payer: Medicare PPO | Admitting: Gastroenterology

## 2021-04-24 ENCOUNTER — Encounter: Payer: Self-pay | Admitting: Gastroenterology

## 2021-04-25 DIAGNOSIS — M722 Plantar fascial fibromatosis: Secondary | ICD-10-CM | POA: Diagnosis not present

## 2021-04-25 DIAGNOSIS — E661 Drug-induced obesity: Secondary | ICD-10-CM | POA: Diagnosis not present

## 2021-04-25 DIAGNOSIS — R091 Pleurisy: Secondary | ICD-10-CM | POA: Diagnosis not present

## 2021-05-02 ENCOUNTER — Telehealth: Payer: Medicare PPO | Admitting: Internal Medicine

## 2021-05-05 NOTE — Telephone Encounter (Signed)
Prior authorization sent in this morning. Awaiting approval.

## 2021-05-15 NOTE — Progress Notes (Signed)
Subjective:    Patient ID: [name], female    DOB: May 07, 1971, 50 y.o.   MRN: [id number]  HPI The patient is here for follow up of their chronic medical problems, including htn, DM, hichol, obesity, GERD, anxiety, chronic left knee pain   She is back at the gym.  She is frustrated by her weight gain.    She is nauseous every day.  She wonders if it is some of her medication.  It tends to come once a day - no pattern to when it comes and lasts about 15 minutes.    She had covid and that was really bad.    Medications and allergies reviewed with patient and updated if appropriate.  Patient Active Problem List   Diagnosis Date Noted   Screening for colon cancer    Adenomatous polyp of transverse colon    On home oxygen therapy 03/27/2021   Genital herpes 04/11/2020   History of nephrolithiasis 10/13/2019   Anxiety 02/05/2018   Peroneal tendinitis of lower leg, right 10/22/2017   Chronic respiratory failure with hypoxia ([place]) 07/20/2017   Knee pain, left 02/02/2016   Cough variant asthma 03/09/2015   Diabetes type 2, uncontrolled ([place]) 12/09/2012   Dyslipidemia    Acute on chronic respiratory failure with hypoxia ([place]) 04/05/2012   Allergic rhinitis 01/05/2010   Essential hypertension 09/07/2009   URINARY INCONTINENCE, URGE 03/24/2009   CHRONIC RHINITIS 12/23/2008   Morbid obesity due to excess calories ([place]) 10/10/2007   INTERSTITIAL LUNG DISEASE 10/10/2007   GASTROESOPHAGEAL REFLUX DISEASE 10/10/2007    Current Outpatient Medications on File Prior to Visit  Medication Sig Dispense Refill   Accu-Chek Softclix Lancets lancets Use to check blood sugars twice a day 100 each 12   albuterol (PROVENTIL HFA;VENTOLIN HFA) 108 (90 Base) MCG/ACT inhaler Inhale 2 puffs into the lungs every 4 (four) hours as needed for wheezing or shortness of breath. 1 Inhaler 0   APPLE CIDER VINEGAR PO Take 450 mg by mouth daily.     aspirin EC 81 MG tablet Take 81 mg by mouth daily. Swallow  whole.     atorvastatin (LIPITOR) 20 MG tablet TAKE ONE TABLET BY MOUTH DAILY 90 tablet 1   B COMPLEX VITAMINS SL Place 1 mL under the tongue 4 (four) times a week.     Blood Glucose Monitoring Suppl (ACCU-CHEK AVIVA PLUS) w/Device KIT Use to check blood sugars twice a day 1 kit 0   calcium-vitamin D (OSCAL WITH D) 500-200 MG-UNIT tablet Take 1 tablet by mouth daily.     celecoxib (CELEBREX) 200 MG capsule TAKE ONE CAPSULE BY MOUTH TWICE A DAY 60 capsule 5   Cholecalciferol (VITAMIN D3) 50 MCG (2000 UT) capsule Take 1 capsule (2,000 Units total) by mouth daily. 100 capsule 3   Dextromethorphan-guaiFENesin (MUCINEX DM MAXIMUM STRENGTH) 60-1200 MG TB12 Take 1 tablet by mouth every 12 (twelve) hours as needed (with flutter valve).     ELDERBERRY PO Take 1,000 mg by mouth daily.     fluticasone (FLONASE) 50 MCG/ACT nasal spray Place 2 sprays into both nostrils daily.     glimepiride (AMARYL) 2 MG tablet Take 1 tablet (2 mg total) by mouth daily before breakfast. 30 tablet 3   glucose blood (ACCU-CHEK AVIVA PLUS) test strip Use to check blood sugars twice a day 100 each 12   JARDIANCE 25 MG TABS tablet TAKE ONE TABLET BY MOUTH EVERY MORNING WITH OR WITHOUT FOOD 90 tablet 1  loratadine (CLARITIN) 10 MG tablet Take 10 mg by mouth at bedtime.     losartan (COZAAR) 25 MG tablet TAKE 1/2 TABLET BY MOUTH DAILY 45 tablet 1   mometasone-formoterol (DULERA) 100-5 MCG/ACT AERO Inhale 1 puff into the lungs 2 (two) times daily.      montelukast (SINGULAIR) 10 MG tablet Take 1 tablet (10 mg total) by mouth at bedtime. 30 tablet 5   OXYGEN 2lpm with sleep and 3lpm with exertion     pantoprazole (PROTONIX) 40 MG tablet TAKE ONE TABLET BY MOUTH TWICE A DAY BEFORE MEALS 120 tablet 1   predniSONE (DELTASONE) 5 MG tablet Take 1 tablet (5 mg total) by mouth daily. 90 tablet 2   repaglinide (PRANDIN) 1 MG tablet Take 1 tablet (1 mg total) by mouth 3 (three) times daily before meals. 90 tablet 11   Respiratory Therapy  Supplies (FLUTTER) DEVI Use as directed 1 each 0   TRULICITY 4.5 ZO/1.0RU SOPN INJECT 4.5 MG UNDER THE SKIN ONCE WEEKLY 6 mL 1   Turmeric 500 MG CAPS Take 500 mg by mouth daily.     venlafaxine XR (EFFEXOR-XR) 37.5 MG 24 hr capsule Take 1 capsule (37.5 mg total) by mouth daily with breakfast. 90 capsule 2   VITRON-C 65-125 MG TABS Take 1 tablet by mouth daily.     No current facility-administered medications on file prior to visit.    Past Medical History:  Diagnosis Date   Allergic rhinitis    Arthritis    Knees ankles  feet   Bronchitis    Chronic rhinitis    Cough    Dyslipidemia    Dysphagia    Dyspnea    occationally over last 2 years   Fibromyalgia    GERD (gastroesophageal reflux disease)    Headache(784.0)    Hyperlipidemia    ILD (interstitial lung disease) ([place])    Mild depression ([place])    Morbid obesity ([place])    Type II or diabetes mellitus, uncontrolled 11/2012 dx   Dx 12/08/12: a1c 10.0   Unspecified essential hypertension    Urge incontinence     Past Surgical History:  Procedure Laterality Date   ABDOMINAL HYSTERECTOMY     BILATERAL VATS ABLATION  02/15/04   BIOPSY  04/17/2021   Procedure: BIOPSY;  Surgeon: Thornton Park, MD;  [facility]: [facility];  Service: Gastroenterology;;   CESAREAN SECTION     COLONOSCOPY WITH PROPOFOL N/A 04/17/2021   Procedure: COLONOSCOPY WITH PROPOFOL;  Surgeon: Thornton Park, MD;  [facility];  Service: Gastroenterology;  Laterality: N/A;   KNEE ARTHROSCOPY WITH MEDIAL MENISECTOMY Right 04/01/2019   Procedure: KNEE ARTHROSCOPY WITH MEDIAL MENISECTOMY;  Surgeon: Hiram Gash, MD;  [facility];  Service: Orthopedics;  Laterality: Right;   KNEE ARTHROSCOPY WITH MEDIAL MENISECTOMY Left 01/06/2020   Procedure: KNEE ARTHROSCOPY WITH MEDIAL MENISECTOMY;  Surgeon: Hiram Gash, MD;  [facility];  Service: Orthopedics;  Laterality: Left;   LUNG BIOPSY     RIGHT HEART CATH N/A 01/30/2017   Procedure: Right Heart  Cath;  Surgeon: Larey Dresser, MD;  Location: [facility];  Service: Cardiovascular;  Laterality: N/A;    Social History   Socioeconomic History   Marital status: Married    Spouse name: Not on file   Number of children: 1   Years of education: Not on file   Highest education level: Not on file  Occupational History   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    Comment:  Works in [employer] and on her feet all day  Tobacco Use   Smoking status: Never   Smokeless tobacco: Never   Tobacco comments:    {  Vaping Use   Vaping Use: Never used  Substance and Sexual Activity   Alcohol use: No    Comment: maybe 2 times per year   Drug use: No   Sexual activity: Never  Other Topics Concern   Not on file  Social History Narrative   Not on file   Social Determinants of Health   Financial Resource Strain: Not on file  Food Insecurity: Not on file  Transportation Needs: Not on file  Physical Activity: Not on file  Stress: Not on file  Social Connections: Not on file    Family History  Problem Relation Age of Onset   Sarcoidosis Mother        dxed by transbrochial bx   Allergies Mother    Heart disease Father    Diabetes Father    Allergies Father    Coronary artery disease Father    Cancer Maternal Grandmother        CA of unknown type   Cancer Paternal Grandmother        CA of unknown type   Colon cancer Neg Hx    Esophageal cancer Neg Hx    Rectal cancer Neg Hx    Stomach cancer Neg Hx     Review of Systems  Constitutional:  Negative for fever.  Respiratory:  Negative for cough, shortness of breath and wheezing.   Cardiovascular:  Positive for leg swelling. Negative for chest pain and palpitations.  Gastrointestinal:  Positive for nausea. Negative for abdominal pain.       No gerd  Neurological:  Positive for light-headedness (occ, transient). Negative for headaches.      Objective:   Vitals:   05/16/21 0747  BP: 130/86  Pulse: 72  Temp: 98.5 F  (36.9 C)  SpO2: 99%   BP Readings from Last 3 Encounters:  05/16/21 130/86  04/17/21 134/85  01/10/21 126/72   Wt Readings from Last 3 Encounters:  05/16/21 249 lb 3.2 oz (113 kg)  04/17/21 250 lb (113.4 kg)  04/05/21 250 lb 12.8 oz (113.8 kg)   Body mass index is 48.67 kg/m.   Physical Exam    Constitutional: Appears well-developed and well-nourished. No distress.  HENT:  Head: Normocephalic and atraumatic.  Neck: Neck supple. No tracheal deviation present. No thyromegaly present.  No cervical lymphadenopathy Cardiovascular: Normal rate, regular rhythm and normal heart sounds.   No murmur heard. No carotid bruit .  No edema Pulmonary/Chest: Effort normal and breath sounds normal. No respiratory distress. No has no wheezes. No rales.  Skin: Skin is warm and dry. Not diaphoretic.  Psychiatric: Normal mood and affect. Behavior is normal.      Assessment & Plan:    See Problem List for Assessment and Plan of chronic medical problems.    This visit occurred during the SARS-CoV-2 public health emergency.  Safety protocols were in place, including screening questions prior to the visit, additional usage of staff PPE, and extensive cleaning of exam room while observing appropriate contact time as indicated for disinfecting solutions.

## 2021-05-15 NOTE — Patient Instructions (Addendum)
  Blood work was ordered.      Medications changes include :  none      Please followup in 3 months

## 2021-05-16 ENCOUNTER — Ambulatory Visit (INDEPENDENT_AMBULATORY_CARE_PROVIDER_SITE_OTHER): Payer: Medicare Other | Admitting: Internal Medicine

## 2021-05-16 ENCOUNTER — Encounter: Payer: Self-pay | Admitting: Internal Medicine

## 2021-05-16 ENCOUNTER — Other Ambulatory Visit: Payer: Self-pay

## 2021-05-16 VITALS — BP 130/86 | HR 72 | Temp 98.5°F | Ht 60.0 in | Wt 249.2 lb

## 2021-05-16 DIAGNOSIS — K219 Gastro-esophageal reflux disease without esophagitis: Secondary | ICD-10-CM

## 2021-05-16 DIAGNOSIS — M25562 Pain in left knee: Secondary | ICD-10-CM

## 2021-05-16 DIAGNOSIS — I10 Essential (primary) hypertension: Secondary | ICD-10-CM | POA: Diagnosis not present

## 2021-05-16 DIAGNOSIS — G8929 Other chronic pain: Secondary | ICD-10-CM | POA: Diagnosis not present

## 2021-05-16 DIAGNOSIS — E1165 Type 2 diabetes mellitus with hyperglycemia: Secondary | ICD-10-CM

## 2021-05-16 DIAGNOSIS — E785 Hyperlipidemia, unspecified: Secondary | ICD-10-CM

## 2021-05-16 DIAGNOSIS — F419 Anxiety disorder, unspecified: Secondary | ICD-10-CM

## 2021-05-16 LAB — LIPID PANEL
Cholesterol: 177 mg/dL (ref 0–200)
HDL: 54.9 mg/dL (ref >39.00)
LDL Cholesterol: 111 mg/dL — ABNORMAL HIGH (ref 0–99)
NonHDL: 121.65
Total CHOL/HDL Ratio: 3
Triglycerides: 51 mg/dL (ref 0.0–149.0)
VLDL: 10.2 mg/dL (ref 0.0–40.0)

## 2021-05-16 LAB — CBC WITH DIFFERENTIAL/PLATELET
Basophils Absolute: 0.1 10*3/uL (ref 0.0–0.1)
Basophils Relative: 0.6 % (ref 0.0–3.0)
Eosinophils Absolute: 0.1 10*3/uL (ref 0.0–0.7)
Eosinophils Relative: 0.9 % (ref 0.0–5.0)
HCT: 43.9 % (ref 36.0–46.0)
Hemoglobin: 14.4 g/dL (ref 12.0–15.0)
Lymphocytes Relative: 28 % (ref 12.0–46.0)
Lymphs Abs: 2.2 10*3/uL (ref 0.7–4.0)
MCHC: 32.7 g/dL (ref 30.0–36.0)
MCV: 91.5 fl (ref 78.0–100.0)
Monocytes Absolute: 0.9 10*3/uL (ref 0.1–1.0)
Monocytes Relative: 11 % (ref 3.0–12.0)
Neutro Abs: 4.7 10*3/uL (ref 1.4–7.7)
Neutrophils Relative %: 59.5 % (ref 43.0–77.0)
Platelets: 230 10*3/uL (ref 150.0–400.0)
RBC: 4.8 Mil/uL (ref 3.87–5.11)
RDW: 15.5 % (ref 11.5–15.5)
WBC: 7.8 10*3/uL (ref 4.0–10.5)

## 2021-05-16 LAB — COMPREHENSIVE METABOLIC PANEL
ALT: 18 U/L (ref 0–35)
AST: 19 U/L (ref 0–37)
Albumin: 4 g/dL (ref 3.5–5.2)
Alkaline Phosphatase: 81 U/L (ref 39–117)
BUN: 13 mg/dL (ref 6–23)
CO2: 29 mEq/L (ref 19–32)
Calcium: 9.9 mg/dL (ref 8.4–10.5)
Chloride: 100 mEq/L (ref 96–112)
Creatinine, Ser: 0.6 mg/dL (ref 0.40–1.20)
GFR: 105.61 mL/min (ref >60.00)
Glucose, Bld: 103 mg/dL — ABNORMAL HIGH (ref 70–99)
Potassium: 3.7 mEq/L (ref 3.5–5.1)
Sodium: 137 mEq/L (ref 135–145)
Total Bilirubin: 0.4 mg/dL (ref 0.2–1.2)
Total Protein: 7.8 g/dL (ref 6.0–8.3)

## 2021-05-16 LAB — HEMOGLOBIN A1C: Hgb A1c MFr Bld: 8.3 % — ABNORMAL HIGH (ref 4.6–6.5)

## 2021-05-16 NOTE — Assessment & Plan Note (Signed)
Chronic Check lipid panel  Continue atorvastatin 20 mg daily   

## 2021-05-16 NOTE — Assessment & Plan Note (Signed)
Chronic Blood pressure adequately controlled, but would like diastolic lower Continue regular exercise and stressed weight loss Continue losartan 12.5 mg daily

## 2021-05-16 NOTE — Assessment & Plan Note (Addendum)
Chronic Eye exam up to date - 10/2020 Check a1c She is has started exercising and will continue-she understands the importance of weight loss Stressed diabetic diet and weight loss to help get her off of some of her medications Continue glimepiride 2 mg daily, Jardiance 25 mg daily, Prandin 1 mg 3 times daily before meals and Trulicity 4.5 mg weekly

## 2021-05-16 NOTE — Assessment & Plan Note (Addendum)
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

## 2021-05-16 NOTE — Assessment & Plan Note (Signed)
Chronic She is having some nausea, but that may be related to when her medications-states GERD is controlled Continue pantoprazole 40 mg twice daily

## 2021-05-16 NOTE — Assessment & Plan Note (Signed)
Chronic She has gained weight since she was here last, which she attributes to COVID and glimepiride She understands the importance of weight loss She has started back at the gym-stressed continuing regular exercise Stressed decrease portions and diabetic diet

## 2021-05-16 NOTE — Assessment & Plan Note (Signed)
Chronic Experiencing increased pain in both knees due to her recent weight gain Continue Celebrex 200 mg twice daily with food as needed Stressed weight loss

## 2021-05-18 ENCOUNTER — Other Ambulatory Visit: Payer: Self-pay | Admitting: Internal Medicine

## 2021-05-19 ENCOUNTER — Other Ambulatory Visit: Payer: Self-pay | Admitting: Internal Medicine

## 2021-05-26 ENCOUNTER — Encounter: Payer: Self-pay | Admitting: Adult Health

## 2021-05-26 DIAGNOSIS — R091 Pleurisy: Secondary | ICD-10-CM | POA: Diagnosis not present

## 2021-05-26 DIAGNOSIS — M722 Plantar fascial fibromatosis: Secondary | ICD-10-CM | POA: Diagnosis not present

## 2021-06-15 ENCOUNTER — Ambulatory Visit
Admission: EM | Admit: 2021-06-15 | Discharge: 2021-06-15 | Disposition: A | Discharge status: Home / Self Care | Payer: Medicare Other | Attending: Internal Medicine | Admitting: Internal Medicine

## 2021-06-15 ENCOUNTER — Encounter: Payer: Self-pay | Admitting: Emergency Medicine

## 2021-06-15 ENCOUNTER — Other Ambulatory Visit: Payer: Self-pay

## 2021-06-15 DIAGNOSIS — R059 Cough, unspecified: Secondary | ICD-10-CM

## 2021-06-15 DIAGNOSIS — J988 Other specified respiratory disorders: Secondary | ICD-10-CM | POA: Diagnosis not present

## 2021-06-15 DIAGNOSIS — R0602 Shortness of breath: Secondary | ICD-10-CM

## 2021-06-15 DIAGNOSIS — B9789 Other viral agents as the cause of diseases classified elsewhere: Secondary | ICD-10-CM | POA: Diagnosis not present

## 2021-06-15 MED ORDER — PREDNISONE 10 MG (21) PO TBPK: ORAL_TABLET | Freq: Every day | ORAL | 0 refills | Status: DC

## 2021-06-15 NOTE — ED Triage Notes (Signed)
Patient c/o productive cough, SOB, congestion since Sunday.  Patient has been taken Mucinex DM, Delsym.  Home COVID test x 2 negative.  Patient is vaccinated for COVID.

## 2021-06-15 NOTE — Discharge Instructions (Addendum)
Please stop your daily 5 mg prednisone dose and start new prednisone prescription.  Restart daily prednisone dose as previously prescribed once this prednisone prescription has been completed.  Please monitor your blood sugars very closely as well and stop high-dose prednisone if blood sugars are above 300.  Please monitor pulse oximetry very closely.

## 2021-06-15 NOTE — ED Provider Notes (Signed)
[name]    CSN: [id number] Arrival date & time: 06/15/21  0840      History   Chief Complaint Chief Complaint  Patient presents with   Cough   Shortness of Breath    HPI [name] is a 50 y.o. female.   Patient presents with productive cough, shortness of breath, nasal congestion that started approximately 4 days ago.  Patient has been taking Mucinex DM and Delsym with minimal relief of cough.  Cough is productive with yellow to clear sputum per patient.  Patient took an at home COVID-19 test 2 days ago and 1 day ago that were both negative.  Patient does have a history of interstitial lung disease where she takes prednisone 5 mg daily as well as albuterol inhaler and Dulera.  Patient states that she has a "sliding scale" for her prednisone and is able to increase to 20 mg when she has sick symptoms.  Patient increased to 20 mg a few days prior with minimal relief of symptoms.  Patient states that she has "felt like this" multiple times prior and has had to have stronger increases in prednisone to help treat the acute illness, and this has been successful.  Shortness of breath is intermittent and is mainly when coughing.  The shortness of breath "feels differently" than her daily baseline shortness of breath.  Patient states that she wears 2 L at night daily and during the day as needed.  Normal oxygen saturation is in the high 90s per patient, but is in the low 90s when she has an acute illness.  Denies any known sick contacts.  Denies any chest pain.   Cough Shortness of Breath  Past Medical History:  Diagnosis Date   Allergic rhinitis    Arthritis    Knees ankles  feet   Bronchitis    Chronic rhinitis    Cough    Dyslipidemia    Dysphagia    Dyspnea    occationally over last 2 years   Fibromyalgia    GERD (gastroesophageal reflux disease)    Headache(784.0)    Hyperlipidemia    ILD (interstitial lung disease) ([place])    Mild depression ([place])    Morbid  obesity ([place])    Type II or diabetes mellitus, uncontrolled 11/2012 dx   Dx 12/08/12: a1c 10.0   Unspecified essential hypertension    Urge incontinence     Patient Active Problem List   Diagnosis Date Noted   Screening for colon cancer    Adenomatous polyp of transverse colon    On home oxygen therapy 03/27/2021   Genital herpes 04/11/2020   History of nephrolithiasis 10/13/2019   Anxiety 02/05/2018   Peroneal tendinitis of lower leg, right 10/22/2017   Chronic respiratory failure with hypoxia ([place]) 07/20/2017   Knee pain, left 02/02/2016   Cough variant asthma 03/09/2015   Diabetes type 2, uncontrolled ([place]) 12/09/2012   Dyslipidemia    Acute on chronic respiratory failure with hypoxia ([place]) 04/05/2012   Allergic rhinitis 01/05/2010   Essential hypertension 09/07/2009   URINARY INCONTINENCE, URGE 03/24/2009   CHRONIC RHINITIS 12/23/2008   Morbid obesity due to excess calories ([place]) 10/10/2007   INTERSTITIAL LUNG DISEASE 10/10/2007   GASTROESOPHAGEAL REFLUX DISEASE 10/10/2007    Past Surgical History:  Procedure Laterality Date   ABDOMINAL HYSTERECTOMY     BILATERAL VATS ABLATION  02/15/04   BIOPSY  04/17/2021   Procedure: BIOPSY;  Surgeon: Thornton Park, MD;  Location: [facility];  Service:  Gastroenterology;;   CESAREAN SECTION     COLONOSCOPY WITH PROPOFOL N/A 04/17/2021   Procedure: COLONOSCOPY WITH PROPOFOL;  Surgeon: Thornton Park, MD;  Location: [facility];  Service: Gastroenterology;  Laterality: N/A;   KNEE ARTHROSCOPY WITH MEDIAL MENISECTOMY Right 04/01/2019   Procedure: KNEE ARTHROSCOPY WITH MEDIAL MENISECTOMY;  Surgeon: Hiram Gash, MD;  Location: [facility];  Service: Orthopedics;  Laterality: Right;   KNEE ARTHROSCOPY WITH MEDIAL MENISECTOMY Left 01/06/2020   Procedure: KNEE ARTHROSCOPY WITH MEDIAL MENISECTOMY;  Surgeon: Hiram Gash, MD;  Location: [facility];  Service: Orthopedics;  Laterality: Left;   LUNG BIOPSY     RIGHT HEART CATH N/A 01/30/2017    Procedure: Right Heart Cath;  Surgeon: Larey Dresser, MD;  Location: [place] [facility];  Service: Cardiovascular;  Laterality: N/A;    OB History   No obstetric history on file.      Home Medications    Prior to Admission medications   Medication Sig Start Date End Date Taking? Authorizing Provider  Accu-Chek Softclix Lancets lancets Use to check blood sugars twice a day 06/29/20  Yes Burns, Claudina Lick, MD  albuterol (PROVENTIL HFA;VENTOLIN HFA) 108 (90 Base) MCG/ACT inhaler Inhale 2 puffs into the lungs every 4 (four) hours as needed for wheezing or shortness of breath. 07/08/17  Yes Mabe, Shanon Brow, NP  APPLE CIDER VINEGAR PO Take 450 mg by mouth daily.   Yes [provider]  aspirin EC 81 MG tablet Take 81 mg by mouth daily. Swallow whole.   Yes [provider]  atorvastatin (LIPITOR) 20 MG tablet TAKE ONE TABLET BY MOUTH DAILY 02/20/21  Yes Burns, Claudina Lick, MD  B COMPLEX VITAMINS SL Place 1 mL under the tongue 4 (four) times a week.   Yes [provider]  Blood Glucose Monitoring Suppl (ACCU-CHEK AVIVA PLUS) w/Device KIT Use to check blood sugars twice a day 06/29/20  Yes Burns, Claudina Lick, MD  calcium-vitamin D (OSCAL WITH D) 500-200 MG-UNIT tablet Take 1 tablet by mouth daily.   Yes [provider]  celecoxib (CELEBREX) 200 MG capsule TAKE ONE CAPSULE BY MOUTH TWICE A DAY 09/13/20  Yes Burns, Claudina Lick, MD  Cholecalciferol (VITAMIN D3) 50 MCG (2000 UT) capsule Take 1 capsule (2,000 Units total) by mouth daily. 09/28/20  Yes Plotnikov, Evie Lacks, MD  ELDERBERRY PO Take 1,000 mg by mouth daily.   Yes [provider]  fluticasone (FLONASE) 50 MCG/ACT nasal spray Place 2 sprays into both nostrils daily.   Yes [provider]  glucose blood (ACCU-CHEK AVIVA PLUS) test strip Use to check blood sugars twice a day 06/29/20  Yes Burns, Claudina Lick, MD  JARDIANCE 25 MG TABS tablet TAKE ONE TABLET BY MOUTH EVERY MORNING WITH OR WITHOUT FOOD 03/15/21  Yes Burns,  Claudina Lick, MD  loratadine (CLARITIN) 10 MG tablet Take 10 mg by mouth at bedtime.   Yes [provider]  losartan (COZAAR) 25 MG tablet TAKE 1/2 TABLET BY MOUTH DAILY 05/23/21  Yes Burns, Claudina Lick, MD  mometasone-formoterol (DULERA) 100-5 MCG/ACT AERO Inhale 1 puff into the lungs 2 (two) times daily.    Yes [provider]  montelukast (SINGULAIR) 10 MG tablet Take 1 tablet (10 mg total) by mouth at bedtime. 02/02/16  Yes Burns, Claudina Lick, MD  OXYGEN 2lpm with sleep and 3lpm with exertion   Yes [provider]  pantoprazole (PROTONIX) 40 MG tablet TAKE ONE TABLET BY MOUTH TWICE A DAY BEFORE MEALS 11/13/19  Yes Burns, Claudina Lick, MD  predniSONE (DELTASONE) 5 MG tablet Take 1 tablet (5 mg total) by mouth daily. 04/21/21  Yes Tanda Rockers, MD  predniSONE (STERAPRED UNI-PAK 21 TAB) 10 MG (21) TBPK tablet Take by mouth daily. Take 6 tabs by mouth daily  for 2 days, then 5 tabs for 2 days, then 4 tabs for 2 days, then 3 tabs for 2 days, 2 tabs for 2 days, then 1 tab by mouth daily for 2 days 06/15/21  Yes Odis Luster, FNP  repaglinide (PRANDIN) 1 MG tablet Take 1 tablet (1 mg total) by mouth 3 (three) times daily before meals. 09/28/20  Yes Plotnikov, Evie Lacks, MD  Respiratory Therapy Supplies (FLUTTER) DEVI Use as directed 07/12/17  Yes Tanda Rockers, MD  TRULICITY 4.5 LK/4.4WN SOPN INJECT 4.5 MG UNDER THE SKIN ONCE WEEKLY 02/13/21  Yes Burns, Claudina Lick, MD  Turmeric 500 MG CAPS Take 500 mg by mouth daily.   Yes [provider]  venlafaxine XR (EFFEXOR-XR) 37.5 MG 24 hr capsule TAKE ONE CAPSULE BY MOUTH DAILY WITH BREAKFAST 05/18/21  Yes Burns, Claudina Lick, MD  VITRON-C 65-125 MG TABS Take 1 tablet by mouth daily.   Yes [provider]  Dextromethorphan-guaiFENesin (MUCINEX DM MAXIMUM STRENGTH) 60-1200 MG TB12 Take 1 tablet by mouth every 12 (twelve) hours as needed (with flutter valve).    [provider]  glimepiride (AMARYL) 2 MG tablet Take 1 tablet (2 mg  total) by mouth daily before breakfast. 01/28/21   Burns, Claudina Lick, MD    Family History Family History  Problem Relation Age of Onset   Sarcoidosis Mother        dxed by transbrochial bx   Allergies Mother    Heart disease Father    Diabetes Father    Allergies Father    Coronary artery disease Father    Cancer Maternal Grandmother        CA of unknown type   Cancer Paternal Grandmother        CA of unknown type   Colon cancer Neg Hx    Esophageal cancer Neg Hx    Rectal cancer Neg Hx    Stomach cancer Neg Hx     Social History Social History   Tobacco Use   Smoking status: Never   Smokeless tobacco: Never   Tobacco comments:    {  Vaping Use   Vaping Use: Never used  Substance Use Topics   Alcohol use: No    Comment: maybe 2 times per year   Drug use: No     Allergies   Penicillins, Metformin and related, Peach flavor, and Levaquin [levofloxacin]   Review of Systems Review of Systems Per HPI  Physical Exam Triage Vital Signs ED Triage Vitals  Enc Vitals Group     BP 06/15/21 0907 123/87     Pulse Rate 06/15/21 0907 (!) 120     Resp 06/15/21 0907 (!) 24     Temp 06/15/21 0907 98.1 F (36.7 C)     Temp Source 06/15/21 0907 Oral     SpO2 06/15/21 0907 92 %     Weight 06/15/21 0908 251 lb (113.9 kg)     Height 06/15/21 0908 5' (1.524 m)     Head Circumference --      Peak Flow --      Pain Score 06/15/21 0908 0     Pain Loc --      Pain Edu? --  Excl. in GC? --    No data found.  Updated Vital Signs BP 123/87 (BP Location: Right Arm)   Pulse 96   Temp 98.1 F (36.7 C) (Oral)   Resp (!) 24   Ht 5' (1.524 m)   Wt 251 lb (113.9 kg)   SpO2 97%   BMI 49.02 kg/m   Visual Acuity Right Eye Distance:   Left Eye Distance:   Bilateral Distance:    Right Eye Near:   Left Eye Near:    Bilateral Near:     Physical Exam Constitutional:      General: She is not in acute distress.    Appearance: Normal appearance.  HENT:     Head:  Normocephalic and atraumatic.     Right Ear: Tympanic membrane and ear canal normal.     Left Ear: Tympanic membrane and ear canal normal.     Nose: Congestion present.     Mouth/Throat:     Mouth: Mucous membranes are moist.     Pharynx: No posterior oropharyngeal erythema.  Eyes:     Extraocular Movements: Extraocular movements intact.     Conjunctiva/sclera: Conjunctivae normal.     Pupils: Pupils are equal, round, and reactive to light.  Cardiovascular:     Rate and Rhythm: Normal rate and regular rhythm.     Pulses: Normal pulses.     Heart sounds: Normal heart sounds.  Pulmonary:     Effort: Pulmonary effort is normal. No respiratory distress.     Breath sounds: Normal breath sounds. No stridor. No wheezing or rhonchi.     Comments: Harsh cough on exam Abdominal:     General: Abdomen is flat. Bowel sounds are normal.     Palpations: Abdomen is soft.  Musculoskeletal:        General: Normal range of motion.     Cervical back: Normal range of motion.  Skin:    General: Skin is warm and dry.  Neurological:     General: No focal deficit present.     Mental Status: She is alert and oriented to person, place, and time. Mental status is at baseline.  Psychiatric:        Mood and Affect: Mood normal.        Behavior: Behavior normal.     UC Treatments / Results  Labs (all labs ordered are listed, but only abnormal results are displayed) Labs Reviewed  NOVEL CORONAVIRUS, NAA    EKG   Radiology No results found.  Procedures Procedures (including critical care time)  Medications Ordered in UC Medications - No data to display  Initial Impression / Assessment and Plan / UC Course  I have reviewed the triage vital signs and the nursing notes.  Pertinent labs & imaging results that were available during my care of the patient were reviewed by me and considered in my medical decision making (see chart for details).     Patient is nontoxic-appearing and does not  appear to be in respiratory distress or in need of immediate medical attention at the hospital at this time.  Physical exam and clinical symptoms seem consistent with the patient's description of flareups during acute illnesses with her chronic lung disease.  Will treat with prednisone steroid taper due to success with this in the past.  Advised patient to resume her daily prednisone once prescription is complete.  Also advised patient to monitor blood glucose very closely.  Patient may continue Mucinex DM for cough.  Advised patient to  monitor pulse oximetry very closely.  Pulse oximetry had recovered from 92 to 97% during physical exam.  Advised patient to go to the hospital if oxygen saturation worsens, shortness of breath worsens, or any symptoms worsen.  Patient voiced understanding.  Covid 19 viral swab pending.  Discussed strict return precautions. Patient verbalized understanding and is agreeable with plan.  Final Clinical Impressions(s) / UC Diagnoses   Final diagnoses:  Viral respiratory infection  Cough  Shortness of breath     Discharge Instructions      Please stop your daily 5 mg prednisone dose and start new prednisone prescription.  Restart daily prednisone dose as previously prescribed once this prednisone prescription has been completed.  Please monitor your blood sugars very closely as well and stop high-dose prednisone if blood sugars are above 300.  Please monitor pulse oximetry very closely.     ED Prescriptions     Medication Sig Dispense Auth. Provider   predniSONE (STERAPRED UNI-PAK 21 TAB) 10 MG (21) TBPK tablet Take by mouth daily. Take 6 tabs by mouth daily  for 2 days, then 5 tabs for 2 days, then 4 tabs for 2 days, then 3 tabs for 2 days, 2 tabs for 2 days, then 1 tab by mouth daily for 2 days 42 tablet Odis Luster, FNP      PDMP not reviewed this encounter.   Odis Luster, FNP 06/15/21 1016

## 2021-06-16 LAB — SARS-COV-2, NAA 2 DAY TAT

## 2021-06-16 LAB — NOVEL CORONAVIRUS, NAA: SARS-CoV-2, NAA: NOT DETECTED

## 2021-06-22 ENCOUNTER — Ambulatory Visit: Payer: Medicare PPO | Admitting: Adult Health

## 2021-06-26 DIAGNOSIS — M722 Plantar fascial fibromatosis: Secondary | ICD-10-CM | POA: Diagnosis not present

## 2021-06-26 DIAGNOSIS — R091 Pleurisy: Secondary | ICD-10-CM | POA: Diagnosis not present

## 2021-06-27 NOTE — Progress Notes (Signed)
  Subjective:    Patient ID: [name], female    DOB: 11/06/1970, 50 y.o.   MRN: [id number]  This visit occurred during the SARS-CoV-2 public health emergency.  Safety protocols were in place, including screening questions prior to the visit, additional usage of staff PPE, and extensive cleaning of exam room while observing appropriate contact time as indicated for disinfecting solutions.    HPI The patient is here for an acute visit for Chest pressure - pain with swallowing, ? GERD:   5 days ago she ate Asian chicken wings and she woke up the next day with a feeling of food being stuck in her mid chest.  That lasted all day.  She thought it was GERD.  She has had associated abdominal bloating, increased belching and she is passing gas.  She is very uncomfortable and cannot eat.  She has been eating a bland diet, no soda or juice.  She is taking her pantoprazole twice a day regularly.  She has been experiencing some loose stool had some stool incontinence-she just does not have control over.  For a while she has had a sore in her perineum/anal area.  She does have that now.  She feels it whenever she wipes.   Has been on high dose prednisone June, August and just got off ot it.  Just got off of it the end of last week.  She is also been on antibiotics with these times.  She is taking a probiotic.  She feels like her whole system is off.  She has also been experiencing increased urinary frequency and wonders if she has a UTI.   Medications and allergies reviewed with patient and updated if appropriate.  Patient Active Problem List   Diagnosis Date Noted   Screening for colon cancer    Adenomatous polyp of transverse colon    On home oxygen therapy 03/27/2021   Genital herpes 04/11/2020   History of nephrolithiasis 10/13/2019   Anxiety 02/05/2018   Peroneal tendinitis of lower leg, right 10/22/2017   Chronic respiratory failure with hypoxia ([facility]) 07/20/2017   Knee pain, left  02/02/2016   Cough variant asthma 03/09/2015   Diabetes type 2, uncontrolled ([facility]) 12/09/2012   Dyslipidemia    Acute on chronic respiratory failure with hypoxia ([facility]) 04/05/2012   Allergic rhinitis 01/05/2010   Essential hypertension 09/07/2009   URINARY INCONTINENCE, URGE 03/24/2009   CHRONIC RHINITIS 12/23/2008   Morbid obesity due to excess calories ([facility]) 10/10/2007   INTERSTITIAL LUNG DISEASE 10/10/2007   GASTROESOPHAGEAL REFLUX DISEASE 10/10/2007    Current Outpatient Medications on File Prior to Visit  Medication Sig Dispense Refill   Accu-Chek Softclix Lancets lancets Use to check blood sugars twice a day 100 each 12   albuterol (PROVENTIL HFA;VENTOLIN HFA) 108 (90 Base) MCG/ACT inhaler Inhale 2 puffs into the lungs every 4 (four) hours as needed for wheezing or shortness of breath. 1 Inhaler 0   APPLE CIDER VINEGAR PO Take 450 mg by mouth daily.     aspirin EC 81 MG tablet Take 81 mg by mouth daily. Swallow whole.     atorvastatin (LIPITOR) 20 MG tablet TAKE ONE TABLET BY MOUTH DAILY 90 tablet 1   B COMPLEX VITAMINS SL Place 1 mL under the tongue 4 (four) times a week.     Blood Glucose Monitoring Suppl (ACCU-CHEK AVIVA PLUS) w/Device KIT Use to check blood sugars twice a day 1 kit 0   calcium-vitamin D (OSCAL WITH D) 500-200   MG-UNIT tablet Take 1 tablet by mouth daily.     celecoxib (CELEBREX) 200 MG capsule TAKE ONE CAPSULE BY MOUTH TWICE A DAY 60 capsule 5   Cholecalciferol (VITAMIN D3) 50 MCG (2000 UT) capsule Take 1 capsule (2,000 Units total) by mouth daily. 100 capsule 3   Dextromethorphan-guaiFENesin (MUCINEX DM MAXIMUM STRENGTH) 60-1200 MG TB12 Take 1 tablet by mouth every 12 (twelve) hours as needed (with flutter valve).     ELDERBERRY PO Take 1,000 mg by mouth daily.     fluticasone (FLONASE) 50 MCG/ACT nasal spray Place 2 sprays into both nostrils daily.     glimepiride (AMARYL) 2 MG tablet Take 1 tablet (2 mg total) by mouth daily before breakfast. 30 tablet 3    glucose blood (ACCU-CHEK AVIVA PLUS) test strip Use to check blood sugars twice a day 100 each 12   JARDIANCE 25 MG TABS tablet TAKE ONE TABLET BY MOUTH EVERY MORNING WITH OR WITHOUT FOOD 90 tablet 1   loratadine (CLARITIN) 10 MG tablet Take 10 mg by mouth at bedtime.     losartan (COZAAR) 25 MG tablet TAKE 1/2 TABLET BY MOUTH DAILY 45 tablet 1   mometasone-formoterol (DULERA) 100-5 MCG/ACT AERO Inhale 1 puff into the lungs 2 (two) times daily.      montelukast (SINGULAIR) 10 MG tablet Take 1 tablet (10 mg total) by mouth at bedtime. 30 tablet 5   OXYGEN 2lpm with sleep and 3lpm with exertion     pantoprazole (PROTONIX) 40 MG tablet TAKE ONE TABLET BY MOUTH TWICE A DAY BEFORE MEALS 120 tablet 1   predniSONE (DELTASONE) 5 MG tablet Take 1 tablet (5 mg total) by mouth daily. 90 tablet 2   repaglinide (PRANDIN) 1 MG tablet Take 1 tablet (1 mg total) by mouth 3 (three) times daily before meals. 90 tablet 11   Respiratory Therapy Supplies (FLUTTER) DEVI Use as directed 1 each 0   TRULICITY 4.5 MG/0.5ML SOPN INJECT 4.5 MG UNDER THE SKIN ONCE WEEKLY 6 mL 1   Turmeric 500 MG CAPS Take 500 mg by mouth daily.     venlafaxine XR (EFFEXOR-XR) 37.5 MG 24 hr capsule TAKE ONE CAPSULE BY MOUTH DAILY WITH BREAKFAST 90 capsule 2   VITRON-C 65-125 MG TABS Take 1 tablet by mouth daily.     No current facility-administered medications on file prior to visit.    Past Medical History:  Diagnosis Date   Allergic rhinitis    Arthritis    Knees ankles  feet   Bronchitis    Chronic rhinitis    Cough    Dyslipidemia    Dysphagia    Dyspnea    occationally over last 2 years   Fibromyalgia    GERD (gastroesophageal reflux disease)    Headache(784.0)    Hyperlipidemia    ILD (interstitial lung disease) ([facility])    Mild depression ([facility])    Morbid obesity ([facility])    Type II or diabetes mellitus, uncontrolled 11/2012 dx   Dx 12/08/12: a1c 10.0   Unspecified essential hypertension    Urge incontinence     Past  Surgical History:  Procedure Laterality Date   ABDOMINAL HYSTERECTOMY     BILATERAL VATS ABLATION  02/15/04   BIOPSY  04/17/2021   Procedure: BIOPSY;  Surgeon: Beavers, Kimberly, MD;  Location: [facility];  Service: Gastroenterology;;   CESAREAN SECTION     COLONOSCOPY WITH PROPOFOL N/A 04/17/2021   Procedure: COLONOSCOPY WITH PROPOFOL;  Surgeon: Beavers, Kimberly, MD;  Location: [facility];    Service: Gastroenterology;  Laterality: N/A;   KNEE ARTHROSCOPY WITH MEDIAL MENISECTOMY Right 04/01/2019   Procedure: KNEE ARTHROSCOPY WITH MEDIAL MENISECTOMY;  Surgeon: Varkey, Dax T, MD;  Location: [facility];  Service: Orthopedics;  Laterality: Right;   KNEE ARTHROSCOPY WITH MEDIAL MENISECTOMY Left 01/06/2020   Procedure: KNEE ARTHROSCOPY WITH MEDIAL MENISECTOMY;  Surgeon: Varkey, Dax T, MD;  Location: [facility];  Service: Orthopedics;  Laterality: Left;   LUNG BIOPSY     RIGHT HEART CATH N/A 01/30/2017   Procedure: Right Heart Cath;  Surgeon: Dalton S McLean, MD;  Location: [facility];  Service: Cardiovascular;  Laterality: N/A;    Social History   Socioeconomic History   Marital status: Married    Spouse name: Not on file   Number of children: 1   Years of education: Not on file   Highest education level: Not on file  Occupational History   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  Tobacco Use   Smoking status: Never   Smokeless tobacco: Never   Tobacco comments:    {  Vaping Use   Vaping Use: Never used  Substance and Sexual Activity   Alcohol use: No    Comment: maybe 2 times per year   Drug use: No   Sexual activity: Never  Other Topics Concern   Not on file  Social History Narrative   Not on file   Social Determinants of Health   Financial Resource Strain: Not on file  Food Insecurity: Not on file  Transportation Needs: Not on file  Physical Activity: Not on file  Stress: Not on file  Social Connections: Not on file     Family History  Problem Relation Age of Onset   Sarcoidosis Mother        dxed by transbrochial bx   Allergies Mother    Heart disease Father    Diabetes Father    Allergies Father    Coronary artery disease Father    Cancer Maternal Grandmother        CA of unknown type   Cancer Paternal Grandmother        CA of unknown type   Colon cancer Neg Hx    Esophageal cancer Neg Hx    Rectal cancer Neg Hx    Stomach cancer Neg Hx     Review of Systems  Constitutional:  Negative for chills and fever.  HENT:  Positive for trouble swallowing.   Gastrointestinal:  Positive for abdominal distention (bloating) and nausea. Negative for abdominal pain (abdominal discomfort - associated with bloating), anal bleeding, blood in stool, constipation and diarrhea.       Gerd, BM normal but no control over stool  Genitourinary:  Positive for frequency. Negative for dysuria and hematuria.  Neurological:  Positive for light-headedness (twice in past copule of weeks).      Objective:   Vitals:   06/28/21 1430  BP: 130/82  Pulse: 97  Temp: 98.7 F (37.1 C)  SpO2: 99%   BP Readings from Last 3 Encounters:  06/28/21 130/82  06/15/21 123/87  05/16/21 130/86   Wt Readings from Last 3 Encounters:  06/28/21 253 lb (114.8 kg)  06/15/21 251 lb (113.9 kg)  05/16/21 249 lb 3.2 oz (113 kg)   Body mass index is 49.41 kg/m.   Physical Exam Constitutional:      General: She is not in acute distress.    Appearance: Normal   appearance. She is not ill-appearing.  HENT:     Head: Normocephalic and atraumatic.  Cardiovascular:     Rate and Rhythm: Normal rate and regular rhythm.     Heart sounds: No murmur heard. Pulmonary:     Effort: Pulmonary effort is normal. No respiratory distress.     Breath sounds: No wheezing or rales.  Abdominal:     General: There is distension.     Palpations: Abdomen is soft.     Tenderness: There is abdominal tenderness (diffuse - mostly in epigastric region).  There is no guarding or rebound.  Genitourinary:    Comments: Small abrasion or sore in perineum/near anus-no active bleeding or discharge Musculoskeletal:     Right lower leg: No edema.     Left lower leg: No edema.  Skin:    General: Skin is warm and dry.  Neurological:     Mental Status: She is alert.         Assessment & Plan:    See Problem List for Assessment and Plan of chronic medical problems.       

## 2021-06-28 ENCOUNTER — Ambulatory Visit (INDEPENDENT_AMBULATORY_CARE_PROVIDER_SITE_OTHER): Payer: Medicare Other | Admitting: Internal Medicine

## 2021-06-28 ENCOUNTER — Encounter: Payer: Self-pay | Admitting: Internal Medicine

## 2021-06-28 ENCOUNTER — Other Ambulatory Visit: Payer: Self-pay

## 2021-06-28 VITALS — BP 130/82 | HR 97 | Temp 98.7°F | Ht 60.0 in | Wt 253.0 lb

## 2021-06-28 DIAGNOSIS — K921 Melena: Secondary | ICD-10-CM | POA: Insufficient documentation

## 2021-06-28 DIAGNOSIS — K219 Gastro-esophageal reflux disease without esophagitis: Secondary | ICD-10-CM

## 2021-06-28 DIAGNOSIS — L290 Pruritus ani: Secondary | ICD-10-CM | POA: Insufficient documentation

## 2021-06-28 DIAGNOSIS — R350 Frequency of micturition: Secondary | ICD-10-CM | POA: Diagnosis not present

## 2021-06-28 DIAGNOSIS — R195 Other fecal abnormalities: Secondary | ICD-10-CM | POA: Diagnosis not present

## 2021-06-28 LAB — URINALYSIS, ROUTINE W REFLEX MICROSCOPIC
Bilirubin Urine: NEGATIVE
Hgb urine dipstick: NEGATIVE
Leukocytes,Ua: NEGATIVE
Nitrite: NEGATIVE
Specific Gravity, Urine: 1.015 (ref 1.000–1.030)
Total Protein, Urine: NEGATIVE
Urine Glucose: >=1000 — AB
Urobilinogen, UA: 0.2 (ref 0.0–1.0)
pH: 6 (ref 5.0–8.0)

## 2021-06-28 MED ORDER — SUCRALFATE 1 G PO TABS: 1.0000 g | ORAL_TABLET | Freq: Three times a day (TID) | ORAL | 0 refills | Status: DC

## 2021-06-28 MED ORDER — FAMOTIDINE 40 MG PO TABS: 40.0000 mg | ORAL_TABLET | Freq: Every day | ORAL | 0 refills | Status: DC

## 2021-06-28 NOTE — Assessment & Plan Note (Signed)
Chronic with acute flare Her symptoms do sound like a flare of GERD been partially related to eating spicy food, but also likely related to recent steroids Continue pantoprazole 40 mg twice daily Start Pepcid 40 mg daily Start Carafate 1 g 4 times daily Once symptoms improve she will be able to stop the Pepcid and Carafate Continue bland diet She will call if there is no improvement

## 2021-06-28 NOTE — Patient Instructions (Signed)
   Urine tests ordered     Medications changes include :   start pepcid 40 mg once daily.  Start carafate 4 times a day.     Start benefiber or metamucil daily.  Continue your probiotic.    Bland diet.   Your prescription(s) have been submitted to your pharmacy. Please take as directed and contact our office if you believe you are having problem(s) with the medication(s).   Update me with your symptoms.

## 2021-06-28 NOTE — Assessment & Plan Note (Signed)
Acute She is having loose stool with some incontinence She thinks she is having complete bowel bowel movements, but I do wonder if she has an element of constipation that is resulting in loose stool, fecal incontinence, abdominal bloating She will take a dose of MiraLAX when she gets home today Continue probiotic Start Benefiber or Metamucil daily She will update me with her symptoms over the next couple of days

## 2021-06-28 NOTE — Assessment & Plan Note (Signed)
Acute Perianal sore, irritation Likely from increased bowel movements, increased wiping Start using A&D ointment

## 2021-06-28 NOTE — Assessment & Plan Note (Signed)
Acute Experiencing increased urinary frequency She is drinking a lot of fluids, but there could also be a UTI Check urinalysis, urine culture

## 2021-06-29 LAB — URINE CULTURE

## 2021-06-30 ENCOUNTER — Encounter: Payer: Self-pay | Admitting: Internal Medicine

## 2021-07-04 ENCOUNTER — Other Ambulatory Visit: Payer: Self-pay | Admitting: Internal Medicine

## 2021-07-10 ENCOUNTER — Other Ambulatory Visit: Payer: Self-pay | Admitting: Internal Medicine

## 2021-07-17 ENCOUNTER — Ambulatory Visit (INDEPENDENT_AMBULATORY_CARE_PROVIDER_SITE_OTHER): Payer: Medicare Other

## 2021-07-17 DIAGNOSIS — Z0000 Encounter for general adult medical examination without abnormal findings: Secondary | ICD-10-CM

## 2021-07-17 NOTE — Progress Notes (Signed)
I connected with [name] today by telephone and verified that I am speaking with the correct person using two identifiers. Location patient: home Location provider: work Persons participating in the virtual visit: patient, provider.   I discussed the limitations, risks, security and privacy concerns of performing an evaluation and management service by telephone and the availability of in person appointments. I also discussed with the patient that there may be a patient responsible charge related to this service. The patient expressed understanding and verbally consented to this telephonic visit.    Interactive audio and video telecommunications were attempted between this provider and patient, however failed, due to patient having technical difficulties OR patient did not have access to video capability.  We continued and completed visit with audio only.  Some vital signs may be absent or patient reported.   Time Spent with patient on telephone encounter: 40 minutes  Subjective:   [name] is a 50 y.o. female who presents for Medicare Annual (Subsequent) preventive examination.  Review of Systems     Cardiac Risk Factors include: diabetes mellitus;dyslipidemia;hypertension;family history of premature cardiovascular disease     Objective:    There were no vitals filed for this visit. There is no height or weight on file to calculate BMI.  Advanced Directives 07/17/2021 04/17/2021 04/11/2020 12/29/2019 03/25/2019 11/08/2017 07/08/2017  Does Patient Have a Medical Advance Directive? No No Yes No No No No  Type of Advance Directive - Public librarian;Living will - - - -  Does patient want to make changes to medical advance directive? - - No - Patient declined - - - -  Copy of [place] in Chart? - - No - copy requested - - - -  Would patient like information on creating a medical advance directive? No - Patient declined No - Patient declined - No -  Patient declined No - Patient declined Yes (ED - Information included in AVS) -    Current Medications (verified) Outpatient Encounter Medications as of 07/17/2021  Medication Sig   Accu-Chek Softclix Lancets lancets Use to check blood sugars twice a day   albuterol (PROVENTIL HFA;VENTOLIN HFA) 108 (90 Base) MCG/ACT inhaler Inhale 2 puffs into the lungs every 4 (four) hours as needed for wheezing or shortness of breath.   APPLE CIDER VINEGAR PO Take 450 mg by mouth daily.   aspirin EC 81 MG tablet Take 81 mg by mouth daily. Swallow whole.   atorvastatin (LIPITOR) 20 MG tablet TAKE ONE TABLET BY MOUTH DAILY   B COMPLEX VITAMINS SL Place 1 mL under the tongue 4 (four) times a week.   Blood Glucose Monitoring Suppl (ACCU-CHEK AVIVA PLUS) w/Device KIT Use to check blood sugars twice a day   calcium-vitamin D (OSCAL WITH D) 500-200 MG-UNIT tablet Take 1 tablet by mouth daily.   celecoxib (CELEBREX) 200 MG capsule TAKE ONE CAPSULE BY MOUTH TWICE A DAY   Cholecalciferol (VITAMIN D3) 50 MCG (2000 UT) capsule Take 1 capsule (2,000 Units total) by mouth daily.   Dextromethorphan-guaiFENesin (MUCINEX DM MAXIMUM STRENGTH) 60-1200 MG TB12 Take 1 tablet by mouth every 12 (twelve) hours as needed (with flutter valve).   ELDERBERRY PO Take 1,000 mg by mouth daily.   famotidine (PEPCID) 40 MG tablet Take 1 tablet (40 mg total) by mouth daily.   fluticasone (FLONASE) 50 MCG/ACT nasal spray Place 2 sprays into both nostrils daily.   glimepiride (AMARYL) 2 MG tablet Take 1 tablet (2 mg total) by  mouth daily before breakfast.   glucose blood (ACCU-CHEK AVIVA PLUS) test strip USE TO CHECK FOR BLOOD SUGAR TWO TIMES A DAY   JARDIANCE 25 MG TABS tablet TAKE ONE TABLET BY MOUTH EVERY MORNING WITH OR WITHOUT FOOD   loratadine (CLARITIN) 10 MG tablet Take 10 mg by mouth at bedtime.   losartan (COZAAR) 25 MG tablet TAKE 1/2 TABLET BY MOUTH DAILY   mometasone-formoterol (DULERA) 100-5 MCG/ACT AERO Inhale 1 puff into the  lungs 2 (two) times daily.    montelukast (SINGULAIR) 10 MG tablet Take 1 tablet (10 mg total) by mouth at bedtime.   OXYGEN 2lpm with sleep and 3lpm with exertion   pantoprazole (PROTONIX) 40 MG tablet TAKE ONE TABLET BY MOUTH TWICE A DAY BEFORE MEALS   predniSONE (DELTASONE) 5 MG tablet Take 1 tablet (5 mg total) by mouth daily.   repaglinide (PRANDIN) 1 MG tablet Take 1 tablet (1 mg total) by mouth 3 (three) times daily before meals.   Respiratory Therapy Supplies (FLUTTER) DEVI Use as directed   sucralfate (CARAFATE) 1 g tablet TAKE ONE TABLET BY MOUTH FOUR TIMES A DAY - WITH MEALS AND AT BEDTIME   TRULICITY 4.5 LO/7.5IE SOPN INJECT 4.5 MG UNDER THE SKIN ONCE WEEKLY   Turmeric 500 MG CAPS Take 500 mg by mouth daily.   venlafaxine XR (EFFEXOR-XR) 37.5 MG 24 hr capsule TAKE ONE CAPSULE BY MOUTH DAILY WITH BREAKFAST   VITRON-C 65-125 MG TABS Take 1 tablet by mouth daily.   No facility-administered encounter medications on file as of 07/17/2021.    Allergies (verified) Penicillins, Metformin and related, Peach flavor, and Levaquin [levofloxacin]   History: Past Medical History:  Diagnosis Date   Allergic rhinitis    Arthritis    Knees ankles  feet   Bronchitis    Chronic rhinitis    Cough    Dyslipidemia    Dysphagia    Dyspnea    occationally over last 2 years   Fibromyalgia    GERD (gastroesophageal reflux disease)    Headache(784.0)    Hyperlipidemia    ILD (interstitial lung disease) ([place])    Mild depression    Morbid obesity ([place])    Type II or diabetes mellitus, uncontrolled 11/2012 dx   Dx 12/08/12: a1c 10.0   Unspecified essential hypertension    Urge incontinence    Past Surgical History:  Procedure Laterality Date   ABDOMINAL HYSTERECTOMY     BILATERAL VATS ABLATION  02/15/04   BIOPSY  04/17/2021   Procedure: BIOPSY;  Surgeon: Thornton Park, MD;  Location: [facility];  Service: Gastroenterology;;   CESAREAN SECTION     COLONOSCOPY WITH PROPOFOL N/A  04/17/2021   Procedure: COLONOSCOPY WITH PROPOFOL;  Surgeon: Thornton Park, MD;  Location: [facility];  Service: Gastroenterology;  Laterality: N/A;   KNEE ARTHROSCOPY WITH MEDIAL MENISECTOMY Right 04/01/2019   Procedure: KNEE ARTHROSCOPY WITH MEDIAL MENISECTOMY;  Surgeon: Hiram Gash, MD;  Location: [facility];  Service: Orthopedics;  Laterality: Right;   KNEE ARTHROSCOPY WITH MEDIAL MENISECTOMY Left 01/06/2020   Procedure: KNEE ARTHROSCOPY WITH MEDIAL MENISECTOMY;  Surgeon: Hiram Gash, MD;  Location: [facility];  Service: Orthopedics;  Laterality: Left;   LUNG BIOPSY     RIGHT HEART CATH N/A 01/30/2017   Procedure: Right Heart Cath;  Surgeon: Larey Dresser, MD;  Location: [facility];  Service: Cardiovascular;  Laterality: N/A;   Family History  Problem Relation Age of Onset   Sarcoidosis Mother  dxed by transbrochial bx   Allergies Mother    Heart disease Father    Diabetes Father    Allergies Father    Coronary artery disease Father    Cancer Maternal Grandmother        CA of unknown type   Cancer Paternal Grandmother        CA of unknown type   Colon cancer Neg Hx    Esophageal cancer Neg Hx    Rectal cancer Neg Hx    Stomach cancer Neg Hx    Social History   Socioeconomic History   Marital status: Married    Spouse name: Not on file   Number of children: 1   Years of education: Not on file   Highest education level: Not on file  Occupational History   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    Comment: Works in [employer] and on her feet all day  Tobacco Use   Smoking status: Never   Smokeless tobacco: Never   Tobacco comments:    {  Vaping Use   Vaping Use: Never used  Substance and Sexual Activity   Alcohol use: No    Comment: maybe 2 times per year   Drug use: No   Sexual activity: Never  Other Topics Concern   Not on file  Social History Narrative   Not on file   Social Determinants of Health   Financial Resource Strain: Low Risk     Difficulty of Paying Living Expenses: Not hard at all  Food Insecurity: No Food Insecurity   Worried About Charity fundraiser in the Last Year: Never true   [place] in the Last Year: Never true  Transportation Needs: No Transportation Needs   Lack of Transportation (Medical): No   Lack of Transportation (Non-Medical): No  Physical Activity: Inactive   Days of Exercise per Week: 0 days   Minutes of Exercise per Session: 0 min  Stress: No Stress Concern Present   Feeling of Stress : Not at all  Social Connections: Socially Integrated   Frequency of Communication with Friends and Family: More than three times a week   Frequency of Social Gatherings with Friends and Family: More than three times a week   Attends Religious Services: More than 4 times per year   Active Member of [employer] or Organizations: Yes   Attends Music therapist: More than 4 times per year   Marital Status: Married    Tobacco Counseling Counseling given: Not Answered Tobacco comments: {   Clinical Intake:  Pre-visit preparation completed: Yes  Pain : No/denies pain     Nutritional Risks: None Diabetes: Yes CBG done?: No Did pt. bring in CBG monitor from home?: No  How often do you need to have someone help you when you read instructions, pamphlets, or other written materials from your doctor or pharmacy?: 1 - Never What is the last grade level you completed in school?: Associate's Degree in Speech & Communication  Diabetic? yes  Interpreter Needed?: No  Information entered by :: Lisette Abu, LPN   Activities of Daily Living In your present state of health, do you have any difficulty performing the following activities: 07/17/2021  Hearing? N  Vision? N  Difficulty concentrating or making decisions? N  Walking or climbing stairs? N  Dressing or bathing? N  Doing errands, shopping? N  Preparing Food and eating ? N  Using the Toilet? N  In the past six months, have  you  accidently leaked urine? Y  Comment wear protection  Do you have problems with loss of bowel control? N  Managing your Medications? N  Managing your Finances? N  Housekeeping or managing your Housekeeping? N  Some recent data might be hidden    Patient Care Team: Binnie Rail, MD as PCP - General (Internal Medicine) Tanda Rockers, MD (Pulmonary Disease) [place] Comment, [facility] [place], [place] as Referring Physician (Optometry)  Indicate any recent Medical Services you may have received from other than [facility] providers in the past year (date may be approximate).     Assessment:   This is a routine wellness examination for [name].  Hearing/Vision screen Hearing Screening - Comments:: Patient denied any hearing difficulty.   No hearing aids.  Vision Screening - Comments:: Patient wears corrective glasses/contacts.  Eye exam done annually by: [facility] ([facility] [place], [place]  Dietary issues and exercise activities discussed: Current Exercise Habits: The patient does not participate in regular exercise at present, Exercise limited by: respiratory conditions(s)   Goals Addressed             This Visit's Progress    Patient Stated       My goal is to get my HgA1C lower than 6.      Depression Screen PHQ 2/9 Scores 07/17/2021 04/11/2020 11/08/2017  PHQ - 2 Score 0 0 1  PHQ- 9 Score - - 4    Fall Risk Fall Risk  07/17/2021 04/11/2020 11/08/2017  Falls in the past year? 0 0 Yes  Number falls in past yr: 0 0 1  Injury with Fall? 0 0 No  Risk for fall due to : No Fall Risks No Fall Risks -  Follow up Falls evaluation completed Falls evaluation completed Falls prevention discussed    FALL RISK PREVENTION PERTAINING TO THE HOME:  Any stairs in or around the home? No  If so, are there any without handrails? No  Home free of loose throw rugs in walkways, pet beds, electrical cords, etc? Yes  Adequate lighting in your home to reduce risk of falls? Yes   ASSISTIVE DEVICES UTILIZED TO PREVENT  FALLS:  Life alert? No  Use of a cane, walker or w/c? No  Grab bars in the bathroom? No  Shower chair or bench in shower? No  Elevated toilet seat or a handicapped toilet? Yes   TIMED UP AND GO:  Was the test performed? No .  Length of time to ambulate 10 feet: n/a sec.   Gait steady and fast without use of assistive device  Cognitive Function: Normal cognitive status assessed by direct observation by this Nurse Health Advisor. No abnormalities found.          Immunizations Immunization History  Administered Date(s) Administered   Influenza Split 06/21/2011, 07/29/2012   Influenza Whole 07/22/2009   Influenza,inj,Quad PF,6+ Mos 06/15/2013, 08/19/2014, 09/19/2015, 08/31/2016, 08/09/2017, 09/03/2018, 06/09/2019, 08/17/2020   Moderna Sars-Covid-2 Vaccination 10/24/2019, 11/28/2019, 06/20/2020   Pneumococcal Conjugate-13 09/19/2015   Pneumococcal Polysaccharide-23 07/22/2009   Td 03/24/2009    TDAP status: Due, Education has been provided regarding the importance of this vaccine. Advised may receive this vaccine at local pharmacy or Health Dept. Aware to provide a copy of the vaccination record if obtained from local pharmacy or Health Dept. Verbalized acceptance and understanding.  Flu Vaccine status: Due, Education has been provided regarding the importance of this vaccine. Advised may receive this vaccine at local pharmacy or Health Dept. Aware to provide a  copy of the vaccination record if obtained from local pharmacy or Health Dept. Verbalized acceptance and understanding.  Pneumococcal vaccine status: Up to date  Covid-19 vaccine status: Completed vaccines  Qualifies for Shingles Vaccine? No   Zostavax completed No   Shingrix Completed?: No.    Education has been provided regarding the importance of this vaccine. Patient has been advised to call insurance company to determine out of pocket expense if they have not yet received this vaccine. Advised may also receive  vaccine at local pharmacy or Health Dept. Verbalized acceptance and understanding.  Screening Tests Health Maintenance  Topic Date Due   OPHTHALMOLOGY EXAM  10/01/2018   INFLUENZA VACCINE  05/01/2021   TETANUS/TDAP  01/10/2022 (Originally 03/25/2019)   HEMOGLOBIN A1C  11/16/2021   FOOT EXAM  01/10/2022   COLONOSCOPY (Pts 45-48yr Insurance coverage will need to be confirmed)  04/18/2031   COVID-19 Vaccine  Completed   Hepatitis C Screening  Completed   HIV Screening  Completed   HPV VACCINES  Aged Out    Health Maintenance  Health Maintenance Due  Topic Date Due   OPHTHALMOLOGY EXAM  10/01/2018   INFLUENZA VACCINE  05/01/2021    Colorectal cancer screening: Type of screening: Colonoscopy. Completed 04/17/2021. Repeat every 10 years  Mammogram status: Completed 10/26/2015. Repeat every year (patient stated that she will schedule for 11/2021)  Bone density status: Patient stated that she had bone density done; need records  Lung Cancer Screening: (Low Dose CT Chest recommended if Age 49-80years, 30 pack-year currently smoking OR have quit w/in 15years.) does not qualify.   Lung Cancer Screening Referral: no  Additional Screening:  Hepatitis C Screening: does qualify; Completed yes  Vision Screening: Recommended annual ophthalmology exams for early detection of glaucoma and other disorders of the eye. Is the patient up to date with their annual eye exam?  Yes  Who is the provider or what is the name of the office in which the patient attends annual eye exams? [facility] [place] [place] If pt is not established with a provider, would they like to be referred to a provider to establish care? No .   Dental Screening: Recommended annual dental exams for proper oral hygiene  Community Resource Referral / Chronic Care Management: CRR required this visit?  No   CCM required this visit?  No      Plan:     I have personally reviewed and noted the following in the patient's chart:    Medical and social history Use of alcohol, tobacco or illicit drugs  Current medications and supplements including opioid prescriptions.  Functional ability and status Nutritional status Physical activity Advanced directives List of other physicians Hospitalizations, surgeries, and ER visits in previous 12 months Vitals Screenings to include cognitive, depression, and falls Referrals and appointments  In addition, I have reviewed and discussed with patient certain preventive protocols, quality metrics, and best practice recommendations. A written personalized care plan for preventive services as well as general preventive health recommendations were provided to patient.     SSheral Flow LPN   [id number]  Nurse Notes:  Patient is cogitatively intact. There were no vitals filed for this visit. There is no height or weight on file to calculate BMI. Patient stated that she has no issues with gait or balance. Hearing Screening - Comments:: Patient denied any hearing difficulty.   No hearing aids.  Vision Screening - Comments:: Patient wears corrective glasses/contacts.  Eye exam done annually by: [facility] ([facility] [place], [place]

## 2021-07-17 NOTE — Patient Instructions (Signed)
[name] , Thank you for taking time to come for your Medicare Wellness Visit. I appreciate your ongoing commitment to your health goals. Please review the following plan we discussed and let me know if I can assist you in the future.   Screening recommendations/referrals: Colonoscopy: 04/17/2021 due every 10 years Mammogram: 10/26/2015; due very year (OVERDUE) Bone Density: per patient done before; need records Recommended yearly ophthalmology/optometry visit for glaucoma screening and checkup Recommended yearly dental visit for hygiene and checkup  Vaccinations: Influenza vaccine: 08/17/2020; due Fall 2022 Pneumococcal vaccine: 07/22/2009, 09/19/2015 Tdap vaccine: 03/24/2009; due every 10 years (OVERDUE) Shingles vaccine: never done   Covid-19: 10/24/2019, 11/28/2019, 06/20/2020  Advanced directives: Advance directive discussed with you today. Even though you declined this today please call our office should you change your mind and we can give you the proper paperwork for you to fill out.  Conditions/risks identified: Yes; to get my HgA1C down to 6.0  Next appointment: Please schedule your next Medicare Wellness Visit with your Nurse Health Advisor in 1 year by calling [phone].   Preventive Care 49 Years and Older, Female Preventive care refers to lifestyle choices and visits with your health care provider that can promote health and wellness. What does preventive care include? A yearly physical exam. This is also called an annual well check. Dental exams once or twice a year. Routine eye exams. Ask your health care provider how often you should have your eyes checked. Personal lifestyle choices, including: Daily care of your teeth and gums. Regular physical activity. Eating a healthy diet. Avoiding tobacco and drug use. Limiting alcohol use. Practicing safe sex. Taking low-dose aspirin every day. Taking vitamin and mineral supplements as recommended by your health care  provider. What happens during an annual well check? The services and screenings done by your health care provider during your annual well check will depend on your age, overall health, lifestyle risk factors, and family history of disease. Counseling  Your health care provider may ask you questions about your: Alcohol use. Tobacco use. Drug use. Emotional well-being. Home and relationship well-being. Sexual activity. Eating habits. History of falls. Memory and ability to understand (cognition). Work and work Statistician. Reproductive health. Screening  You may have the following tests or measurements: Height, weight, and BMI. Blood pressure. Lipid and cholesterol levels. These may be checked every 5 years, or more frequently if you are over 62 years old. Skin check. Lung cancer screening. You may have this screening every year starting at age 49 if you have a 30-pack-year history of smoking and currently smoke or have quit within the past 15 years. Fecal occult blood test (FOBT) of the stool. You may have this test every year starting at age 49. Flexible sigmoidoscopy or colonoscopy. You may have a sigmoidoscopy every 5 years or a colonoscopy every 10 years starting at age 55. Hepatitis C blood test. Hepatitis B blood test. Sexually transmitted disease (STD) testing. Diabetes screening. This is done by checking your blood sugar (glucose) after you have not eaten for a while (fasting). You may have this done every 1-3 years. Bone density scan. This is done to screen for osteoporosis. You may have this done starting at age 49. Mammogram. This may be done every 1-2 years. Talk to your health care provider about how often you should have regular mammograms. Talk with your health care provider about your test results, treatment options, and if necessary, the need for more tests. Vaccines  Your health care provider may  recommend certain vaccines, such as: Influenza vaccine. This is  recommended every year. Tetanus, diphtheria, and acellular pertussis (Tdap, Td) vaccine. You may need a Td booster every 10 years. Zoster vaccine. You may need this after age 49. Pneumococcal 13-valent conjugate (PCV13) vaccine. One dose is recommended after age 67. Pneumococcal polysaccharide (PPSV23) vaccine. One dose is recommended after age 5. Talk to your health care provider about which screenings and vaccines you need and how often you need them. This information is not intended to replace advice given to you by your health care provider. Make sure you discuss any questions you have with your health care provider. Document Released: 10/14/2015 Document Revised: 06/06/2016 Document Reviewed: 07/19/2015 Elsevier Interactive Patient Education  2017 [place] Prevention in the Home Falls can cause injuries. They can happen to people of all ages. There are many things you can do to make your home safe and to help prevent falls. What can I do on the outside of my home? Regularly fix the edges of walkways and driveways and fix any cracks. Remove anything that might make you trip as you walk through a door, such as a raised step or threshold. Trim any bushes or trees on the path to your home. Use bright outdoor lighting. Clear any walking paths of anything that might make someone trip, such as rocks or tools. Regularly check to see if handrails are loose or broken. Make sure that both sides of any steps have handrails. Any raised decks and porches should have guardrails on the edges. Have any leaves, snow, or ice cleared regularly. Use sand or salt on walking paths during winter. Clean up any spills in your garage right away. This includes oil or grease spills. What can I do in the bathroom? Use night lights. Install grab bars by the toilet and in the tub and shower. Do not use towel bars as grab bars. Use non-skid mats or decals in the tub or shower. If you need to sit down in  the shower, use a plastic, non-slip stool. Keep the floor dry. Clean up any water that spills on the floor as soon as it happens. Remove soap buildup in the tub or shower regularly. Attach bath mats securely with double-sided non-slip rug tape. Do not have throw rugs and other things on the floor that can make you trip. What can I do in the bedroom? Use night lights. Make sure that you have a light by your bed that is easy to reach. Do not use any sheets or blankets that are too big for your bed. They should not hang down onto the floor. Have a firm chair that has side arms. You can use this for support while you get dressed. Do not have throw rugs and other things on the floor that can make you trip. What can I do in the kitchen? Clean up any spills right away. Avoid walking on wet floors. Keep items that you use a lot in easy-to-reach places. If you need to reach something above you, use a strong step stool that has a grab bar. Keep electrical cords out of the way. Do not use floor polish or wax that makes floors slippery. If you must use wax, use non-skid floor wax. Do not have throw rugs and other things on the floor that can make you trip. What can I do with my stairs? Do not leave any items on the stairs. Make sure that there are handrails on both sides of  the stairs and use them. Fix handrails that are broken or loose. Make sure that handrails are as long as the stairways. Check any carpeting to make sure that it is firmly attached to the stairs. Fix any carpet that is loose or worn. Avoid having throw rugs at the top or bottom of the stairs. If you do have throw rugs, attach them to the floor with carpet tape. Make sure that you have a light switch at the top of the stairs and the bottom of the stairs. If you do not have them, ask someone to add them for you. What else can I do to help prevent falls? Wear shoes that: Do not have high heels. Have rubber bottoms. Are comfortable  and fit you well. Are closed at the toe. Do not wear sandals. If you use a stepladder: Make sure that it is fully opened. Do not climb a closed stepladder. Make sure that both sides of the stepladder are locked into place. Ask someone to hold it for you, if possible. Clearly mark and make sure that you can see: Any grab bars or handrails. First and last steps. Where the edge of each step is. Use tools that help you move around (mobility aids) if they are needed. These include: Canes. Walkers. Scooters. Crutches. Turn on the lights when you go into a dark area. Replace any light bulbs as soon as they burn out. Set up your furniture so you have a clear path. Avoid moving your furniture around. If any of your floors are uneven, fix them. If there are any pets around you, be aware of where they are. Review your medicines with your doctor. Some medicines can make you feel dizzy. This can increase your chance of falling. Ask your doctor what other things that you can do to help prevent falls. This information is not intended to replace advice given to you by your health care provider. Make sure you discuss any questions you have with your health care provider. Document Released: 07/14/2009 Document Revised: 02/23/2016 Document Reviewed: 10/22/2014 Elsevier Interactive Patient Education  2017 [employer].

## 2021-07-19 ENCOUNTER — Other Ambulatory Visit: Payer: Self-pay | Admitting: Internal Medicine

## 2021-07-20 ENCOUNTER — Encounter: Payer: Self-pay | Admitting: Internal Medicine

## 2021-07-20 NOTE — Progress Notes (Signed)
Outside notes received. Information abstracted. Notes sent to scan.  

## 2021-07-24 ENCOUNTER — Other Ambulatory Visit: Payer: Self-pay | Admitting: Internal Medicine

## 2021-07-26 DIAGNOSIS — R091 Pleurisy: Secondary | ICD-10-CM | POA: Diagnosis not present

## 2021-07-26 DIAGNOSIS — M722 Plantar fascial fibromatosis: Secondary | ICD-10-CM | POA: Diagnosis not present

## 2021-08-01 ENCOUNTER — Other Ambulatory Visit: Payer: Self-pay | Admitting: Internal Medicine

## 2021-08-16 ENCOUNTER — Ambulatory Visit: Payer: Medicare Other | Admitting: Internal Medicine

## 2021-08-19 ENCOUNTER — Other Ambulatory Visit: Payer: Self-pay | Admitting: Internal Medicine

## 2021-08-21 ENCOUNTER — Other Ambulatory Visit: Payer: Self-pay | Admitting: Internal Medicine

## 2021-08-26 DIAGNOSIS — R091 Pleurisy: Secondary | ICD-10-CM | POA: Diagnosis not present

## 2021-08-26 DIAGNOSIS — M722 Plantar fascial fibromatosis: Secondary | ICD-10-CM | POA: Diagnosis not present

## 2021-09-10 ENCOUNTER — Other Ambulatory Visit: Payer: Self-pay | Admitting: Internal Medicine

## 2021-09-25 DIAGNOSIS — R091 Pleurisy: Secondary | ICD-10-CM | POA: Diagnosis not present

## 2021-09-25 DIAGNOSIS — M722 Plantar fascial fibromatosis: Secondary | ICD-10-CM | POA: Diagnosis not present

## 2021-09-29 DIAGNOSIS — H10023 Other mucopurulent conjunctivitis, bilateral: Secondary | ICD-10-CM | POA: Diagnosis not present

## 2021-09-29 DIAGNOSIS — S0501XA Injury of conjunctiva and corneal abrasion without foreign body, right eye, initial encounter: Secondary | ICD-10-CM | POA: Diagnosis not present

## 2021-09-30 DIAGNOSIS — H04123 Dry eye syndrome of bilateral lacrimal glands: Secondary | ICD-10-CM | POA: Diagnosis not present

## 2021-10-10 ENCOUNTER — Other Ambulatory Visit: Payer: Self-pay | Admitting: Internal Medicine

## 2021-10-17 ENCOUNTER — Other Ambulatory Visit: Payer: Self-pay | Admitting: Internal Medicine

## 2021-11-20 ENCOUNTER — Other Ambulatory Visit: Payer: Self-pay | Admitting: Internal Medicine

## 2021-11-26 DIAGNOSIS — M722 Plantar fascial fibromatosis: Secondary | ICD-10-CM | POA: Diagnosis not present

## 2021-11-26 DIAGNOSIS — R091 Pleurisy: Secondary | ICD-10-CM | POA: Diagnosis not present

## 2021-12-24 ENCOUNTER — Other Ambulatory Visit: Payer: Self-pay | Admitting: Internal Medicine

## 2021-12-24 DIAGNOSIS — R091 Pleurisy: Secondary | ICD-10-CM | POA: Diagnosis not present

## 2021-12-24 DIAGNOSIS — M722 Plantar fascial fibromatosis: Secondary | ICD-10-CM | POA: Diagnosis not present

## 2021-12-28 ENCOUNTER — Other Ambulatory Visit: Payer: Self-pay | Admitting: Internal Medicine

## 2022-01-24 DIAGNOSIS — M722 Plantar fascial fibromatosis: Secondary | ICD-10-CM | POA: Diagnosis not present

## 2022-01-24 DIAGNOSIS — R091 Pleurisy: Secondary | ICD-10-CM | POA: Diagnosis not present

## 2022-01-26 ENCOUNTER — Other Ambulatory Visit: Payer: Self-pay | Admitting: Internal Medicine

## 2022-01-30 ENCOUNTER — Other Ambulatory Visit: Payer: Self-pay | Admitting: *Deleted

## 2022-01-30 MED ORDER — REPAGLINIDE 0.5 MG PO TABS: ORAL_TABLET | ORAL | 0 refills | Status: DC

## 2022-02-23 ENCOUNTER — Other Ambulatory Visit: Payer: Self-pay | Admitting: Internal Medicine

## 2022-02-23 DIAGNOSIS — M722 Plantar fascial fibromatosis: Secondary | ICD-10-CM | POA: Diagnosis not present

## 2022-02-23 DIAGNOSIS — R091 Pleurisy: Secondary | ICD-10-CM | POA: Diagnosis not present

## 2022-02-24 ENCOUNTER — Other Ambulatory Visit: Payer: Self-pay | Admitting: Internal Medicine

## 2022-03-03 ENCOUNTER — Encounter: Payer: Self-pay | Admitting: Internal Medicine

## 2022-03-03 NOTE — Patient Instructions (Addendum)
     Blood work was ordered.     Medications changes include :   mounjaro 2.5 mg weekly   Your prescription(s) have been sent to your pharmacy.     Return in about 3 months (around 06/06/2022) for Physical Exam.

## 2022-03-03 NOTE — Progress Notes (Signed)
Subjective:    Patient ID: [name], female    DOB: [birth year], 51 y.o.   MRN: [id number]     HPI Brexlee is here for follow up of her chronic medical problems, including DM, htn, hld, obesity, anxiety, GERD, chronic left knee pain   Sugars can be 90-95 or 120 in the morning.  She is not compliant with a diabetic diet.  She did forget the trulicity  at times.  She does have a lot of stress and that does not help her compliance with a diabetic diet-she tends to crave sugars during those times. She is going to start walking today.  Medications and allergies reviewed with patient and updated if appropriate.  Current Outpatient Medications on File Prior to Visit  Medication Sig Dispense Refill   Accu-Chek Softclix Lancets lancets Use to check blood sugars twice a day 100 each 12   albuterol (PROVENTIL HFA;VENTOLIN HFA) 108 (90 Base) MCG/ACT inhaler Inhale 2 puffs into the lungs every 4 (four) hours as needed for wheezing or shortness of breath. 1 Inhaler 0   APPLE CIDER VINEGAR PO Take 450 mg by mouth daily.     aspirin EC 81 MG tablet Take 81 mg by mouth daily. Swallow whole.     atorvastatin (LIPITOR) 20 MG tablet TAKE ONE TABLET BY MOUTH DAILY 90 tablet 1   B COMPLEX VITAMINS SL Place 1 mL under the tongue 4 (four) times a week.     Blood Glucose Monitoring Suppl (ACCU-CHEK AVIVA PLUS) w/Device KIT Use to check blood sugars twice a day 1 kit 0   calcium-vitamin D (OSCAL WITH D) 500-200 MG-UNIT tablet Take 1 tablet by mouth daily.     celecoxib (CELEBREX) 200 MG capsule TAKE ONE CAPSULE BY MOUTH TWICE A DAY 60 capsule 5   Cholecalciferol (VITAMIN D3) 50 MCG (2000 UT) capsule Take 1 capsule (2,000 Units total) by mouth daily. 100 capsule 3   Dextromethorphan-guaiFENesin (MUCINEX DM MAXIMUM STRENGTH) 60-1200 MG TB12 Take 1 tablet by mouth every 12 (twelve) hours as needed (with flutter valve).     ELDERBERRY PO Take 1,000 mg by mouth daily.     famotidine (PEPCID) 40 MG tablet  TAKE ONE TABLET BY MOUTH DAILY 30 tablet 0   fluticasone (FLONASE) 50 MCG/ACT nasal spray Place 2 sprays into both nostrils daily.     glimepiride (AMARYL) 2 MG tablet Take 1 tablet (2 mg total) by mouth daily before breakfast. 30 tablet 3   glucose blood (ACCU-CHEK AVIVA PLUS) test strip USE TO CHECK FOR BLOOD SUGAR TWO TIMES A DAY 100 strip 0   JARDIANCE 25 MG TABS tablet TAKE 1 TABLET BY MOUTH EVERY MORNING WITH OR WITHOUT FOOD 90 tablet 0   loratadine (CLARITIN) 10 MG tablet Take 10 mg by mouth at bedtime.     losartan (COZAAR) 25 MG tablet TAKE 1/2 TABLET BY MOUTH DAILY 45 tablet 1   mometasone-formoterol (DULERA) 100-5 MCG/ACT AERO Inhale 1 puff into the lungs 2 (two) times daily.      montelukast (SINGULAIR) 10 MG tablet Take 1 tablet (10 mg total) by mouth at bedtime. 30 tablet 5   OXYGEN 2lpm with sleep and 3lpm with exertion     pantoprazole (PROTONIX) 40 MG tablet TAKE ONE TABLET BY MOUTH TWICE A DAY BEFORE MEALS 120 tablet 1   predniSONE (DELTASONE) 5 MG tablet TAKE ONE TABLET BY MOUTH DAILY 90 tablet 2   repaglinide (PRANDIN) 0.5 MG tablet TAKE TWO TABLETS  BY MOUTH THREE TIMES A DAY BEFORE A MEAL 180 tablet 0   Respiratory Therapy Supplies (FLUTTER) DEVI Use as directed 1 each 0   sucralfate (CARAFATE) 1 g tablet TAKE ONE TABLET BY MOUTH FOUR TIMES A DAY WITH MEALS AND AT BEDTIME 40 tablet 0   TRULICITY 4.5 WR/6.0AV SOPN INJECT 4.5 MG UNDER THE SKIN ONCE WEEKLY 2 mL 1   Turmeric 500 MG CAPS Take 500 mg by mouth daily.     venlafaxine XR (EFFEXOR-XR) 37.5 MG 24 hr capsule TAKE ONE CAPSULE BY MOUTH DAILY WITH BREAKFAST 90 capsule 2   VITRON-C 65-125 MG TABS Take 1 tablet by mouth daily.     No current facility-administered medications on file prior to visit.     Review of Systems  Constitutional:  Negative for fever.  Respiratory:  Negative for cough, shortness of breath and wheezing.   Cardiovascular:  Negative for chest pain, palpitations and leg swelling.  Neurological:   Negative for light-headedness and headaches.      Objective:   Vitals:   03/06/22 0901  BP: 124/78  Pulse: 70  Temp: 98.3 F (36.8 C)  SpO2: 99%   BP Readings from Last 3 Encounters:  03/06/22 124/78  06/28/21 130/82  06/15/21 123/87   Wt Readings from Last 3 Encounters:  03/06/22 250 lb (113.4 kg)  06/28/21 253 lb (114.8 kg)  06/15/21 251 lb (113.9 kg)   Body mass index is 48.82 kg/m.    Physical Exam Constitutional:      General: She is not in acute distress.    Appearance: Normal appearance.  HENT:     Head: Normocephalic and atraumatic.  Eyes:     Conjunctiva/sclera: Conjunctivae normal.  Cardiovascular:     Rate and Rhythm: Normal rate and regular rhythm.     Heart sounds: Normal heart sounds. No murmur heard. Pulmonary:     Effort: Pulmonary effort is normal. No respiratory distress.     Breath sounds: Normal breath sounds. No wheezing.  Musculoskeletal:     Cervical back: Neck supple.     Right lower leg: No edema.     Left lower leg: No edema.  Lymphadenopathy:     Cervical: No cervical adenopathy.  Skin:    General: Skin is warm and dry.     Findings: No rash.  Neurological:     Mental Status: She is alert. Mental status is at baseline.  Psychiatric:        Mood and Affect: Mood normal.        Behavior: Behavior normal.       Lab Results  Component Value Date   WBC 7.8 05/16/2021   HGB 14.4 05/16/2021   HCT 43.9 05/16/2021   PLT 230.0 05/16/2021   GLUCOSE 103 (H) 05/16/2021   CHOL 177 05/16/2021   TRIG 51.0 05/16/2021   HDL 54.90 05/16/2021   LDLDIRECT 153.1 12/08/2012   LDLCALC 111 (H) 05/16/2021   ALT 18 05/16/2021   AST 19 05/16/2021   NA 137 05/16/2021   K 3.7 05/16/2021   CL 100 05/16/2021   CREATININE 0.60 05/16/2021   BUN 13 05/16/2021   CO2 29 05/16/2021   TSH 1.25 05/09/2017   INR 1.01 01/30/2017   HGBA1C 8.3 (H) 05/16/2021   MICROALBUR 1.1 05/16/2016     Assessment & Plan:    See Problem List for Assessment and  Plan of chronic medical problems.

## 2022-03-05 ENCOUNTER — Other Ambulatory Visit: Payer: Self-pay | Admitting: Internal Medicine

## 2022-03-06 ENCOUNTER — Ambulatory Visit (INDEPENDENT_AMBULATORY_CARE_PROVIDER_SITE_OTHER): Payer: Medicare Other | Admitting: Internal Medicine

## 2022-03-06 VITALS — BP 124/78 | HR 70 | Temp 98.3°F | Ht 60.0 in | Wt 250.0 lb

## 2022-03-06 DIAGNOSIS — K219 Gastro-esophageal reflux disease without esophagitis: Secondary | ICD-10-CM | POA: Diagnosis not present

## 2022-03-06 DIAGNOSIS — E1165 Type 2 diabetes mellitus with hyperglycemia: Secondary | ICD-10-CM

## 2022-03-06 DIAGNOSIS — I10 Essential (primary) hypertension: Secondary | ICD-10-CM | POA: Diagnosis not present

## 2022-03-06 DIAGNOSIS — G8929 Other chronic pain: Secondary | ICD-10-CM | POA: Diagnosis not present

## 2022-03-06 DIAGNOSIS — F419 Anxiety disorder, unspecified: Secondary | ICD-10-CM

## 2022-03-06 DIAGNOSIS — E785 Hyperlipidemia, unspecified: Secondary | ICD-10-CM | POA: Diagnosis not present

## 2022-03-06 DIAGNOSIS — M25562 Pain in left knee: Secondary | ICD-10-CM

## 2022-03-06 LAB — LIPID PANEL
Cholesterol: 168 mg/dL (ref 0–200)
HDL: 49.7 mg/dL (ref >39.00)
LDL Cholesterol: 100 mg/dL — ABNORMAL HIGH (ref 0–99)
NonHDL: 118.26
Total CHOL/HDL Ratio: 3
Triglycerides: 91 mg/dL (ref 0.0–149.0)
VLDL: 18.2 mg/dL (ref 0.0–40.0)

## 2022-03-06 LAB — COMPREHENSIVE METABOLIC PANEL
ALT: 21 U/L (ref 0–35)
AST: 22 U/L (ref 0–37)
Albumin: 3.7 g/dL (ref 3.5–5.2)
Alkaline Phosphatase: 82 U/L (ref 39–117)
BUN: 16 mg/dL (ref 6–23)
CO2: 28 mEq/L (ref 19–32)
Calcium: 9.3 mg/dL (ref 8.4–10.5)
Chloride: 101 mEq/L (ref 96–112)
Creatinine, Ser: 0.6 mg/dL (ref 0.40–1.20)
GFR: 105.02 mL/min (ref >60.00)
Glucose, Bld: 170 mg/dL — ABNORMAL HIGH (ref 70–99)
Potassium: 3.6 mEq/L (ref 3.5–5.1)
Sodium: 138 mEq/L (ref 135–145)
Total Bilirubin: 0.6 mg/dL (ref 0.2–1.2)
Total Protein: 7.1 g/dL (ref 6.0–8.3)

## 2022-03-06 LAB — HEMOGLOBIN A1C: Hgb A1c MFr Bld: 8.9 % — ABNORMAL HIGH (ref 4.6–6.5)

## 2022-03-06 MED ORDER — TIRZEPATIDE 2.5 MG/0.5ML ~~LOC~~ SOAJ: 2.5000 mg | SUBCUTANEOUS | 0 refills | Status: DC

## 2022-03-06 NOTE — Assessment & Plan Note (Signed)
Chronic GERD controlled Continue pantoprazole 40 mg daily 

## 2022-03-06 NOTE — Assessment & Plan Note (Addendum)
Chronic Has high anxiety, but overall doing well Controlled, stable Continue effexor 37.5 mg daily

## 2022-03-06 NOTE — Assessment & Plan Note (Signed)
Chronic BP well controlled Continue losartan 12.5 mg daily cmp

## 2022-03-06 NOTE — Assessment & Plan Note (Signed)
Chronic Stressed importance of losing weight Increase exercise, decreased portions, diet high in protein and veges, low in carbs/sugars Will see if we can get mounjaro covered to replace trulicity

## 2022-03-06 NOTE — Assessment & Plan Note (Addendum)
Chronic Continue celebrex 200  mg bid prn - usually takes it once a day Encouraged weight loss

## 2022-03-06 NOTE — Assessment & Plan Note (Signed)
Chronic Check lipid panel  Continue atorvastatin 20 mg daily Regular exercise and healthy diet encouraged  

## 2022-03-06 NOTE — Assessment & Plan Note (Addendum)
Chronic  Lab Results  Component Value Date   HGBA1C 8.3 (H) 05/16/2021   Sugars not controlled Testing sugars 1-2 times a day Check A1c, urine microalbumin today Continue trulicity 4.5 mg weekly, jardiance 25 mg daily, glimepiride 2 mg daily, prandin 1 mg three times a day Stressed regular exercise, diabetic diet  We will see if we can get Mounjaro approved to replace Trulicity.  Sugars not ideally controlled and she really needs to lose weight in addition to getting sugars better controlled

## 2022-03-07 ENCOUNTER — Telehealth: Payer: Self-pay | Admitting: *Deleted

## 2022-03-07 NOTE — Telephone Encounter (Signed)
Pt was on cover-my-meds need PA for [facility].. submitted PA w/ Key: [id number] - PA Case ID: [id number]. Sent to insurance for determination...[name]

## 2022-03-08 ENCOUNTER — Ambulatory Visit (INDEPENDENT_AMBULATORY_CARE_PROVIDER_SITE_OTHER): Payer: Medicare Other

## 2022-03-08 ENCOUNTER — Ambulatory Visit
Admission: EM | Admit: 2022-03-08 | Discharge: 2022-03-08 | Disposition: A | Discharge status: Home / Self Care | Payer: Medicare Other | Attending: Internal Medicine | Admitting: Internal Medicine

## 2022-03-08 ENCOUNTER — Encounter: Payer: Self-pay | Admitting: Internal Medicine

## 2022-03-08 DIAGNOSIS — U071 COVID-19: Secondary | ICD-10-CM

## 2022-03-08 DIAGNOSIS — R059 Cough, unspecified: Secondary | ICD-10-CM

## 2022-03-08 MED ORDER — MOLNUPIRAVIR EUA 200MG CAPSULE
4.0000 | ORAL_CAPSULE | Freq: Two times a day (BID) | ORAL | 0 refills | Status: AC
Start: 2022-03-08 — End: 2022-03-13

## 2022-03-08 MED ORDER — PREDNISONE 10 MG (21) PO TBPK: ORAL_TABLET | Freq: Every day | ORAL | 0 refills | Status: DC

## 2022-03-08 NOTE — Telephone Encounter (Signed)
Check status on PA rec'd msg stating " Standard Formulary Medical Necessity [facility] NTM [facility] STD 11-2020 **CAS WEB** ePA Review [facility]); Qset has denial reasons.Mounjaro DENIED...[name]

## 2022-03-08 NOTE — ED Triage Notes (Signed)
Patient presents to Urgent Care with complaints of cough, congestion, and congestion since Monday night . Patient reports she has pmh of interstitial lung disease and has been having to wear o2 at home. She is also taking otc medications. Covid pos at home test today, but notes that the test was expired

## 2022-03-08 NOTE — ED Provider Notes (Signed)
[name]    CSN: [id number] Arrival date & time: 03/08/22  6387      History   Chief Complaint Chief Complaint  Patient presents with   Cough    HPI [name] is a 51 y.o. female.   Patient presents with nasal congestion and nonproductive cough that has been present for approximately 4 days.  Patient reports that she took a COVID test at home that was positive.  She has recently been exposed to a friend that tested positive for COVID as well.  Patient has a history of interstitial lung disease and monitors her oxygen routinely at home.  She reports that her normal oxygen is typically 100%.  Since her acute illness started, her oxygen has been ranging from 93 to 94% so she is very concerned.  She denies any chest pain, shortness of breath, sore throat, ear pain, nausea, vomiting, diarrhea, abdominal pain.  Patient has been wearing oxygen at home intermittently given her oxygen saturation status.  Patient takes prednisone 5 mg daily but has been told by pulmonology that she may increase to 20 mg and taper down if she has acute symptoms.  She started taking 20 mg when symptoms first started and has been tapering down over the past few days.  She states that she sometimes has to take higher dosage steroid taper to help alleviate symptoms.  She would like COVID antiviral medication as well.   Cough   Past Medical History:  Diagnosis Date   Allergic rhinitis    Arthritis    Knees ankles  feet   Bronchitis    Chronic rhinitis    Cough    Dyslipidemia    Dysphagia    Dyspnea    occationally over last 2 years   Fibromyalgia    GERD (gastroesophageal reflux disease)    Headache(784.0)    Hyperlipidemia    ILD (interstitial lung disease) (HCC)    Mild depression    Morbid obesity ([place])    Type II or diabetes mellitus, uncontrolled 11/2012 dx   Dx 12/08/12: a1c 10.0   Unspecified essential hypertension    Urge incontinence     Patient Active Problem List    Diagnosis Date Noted   Urinary frequency 06/28/2021   Change in stool 06/28/2021   Perianal irritation 06/28/2021   Screening for colon cancer    Adenomatous polyp of transverse colon    On home oxygen therapy 03/27/2021   Genital herpes 04/11/2020   History of nephrolithiasis 10/13/2019   Anxiety 02/05/2018   Peroneal tendinitis of lower leg, right 10/22/2017   Chronic respiratory failure with hypoxia ([place]) 07/20/2017   Knee pain, left 02/02/2016   Cough variant asthma 03/09/2015   Diabetes ([place]) 12/09/2012   Dyslipidemia    Acute on chronic respiratory failure with hypoxia ([place]) 04/05/2012   Allergic rhinitis 01/05/2010   Essential hypertension 09/07/2009   URINARY INCONTINENCE, URGE 03/24/2009   CHRONIC RHINITIS 12/23/2008   Morbid obesity due to excess calories ([place]) 10/10/2007   INTERSTITIAL LUNG DISEASE 10/10/2007   GASTROESOPHAGEAL REFLUX DISEASE 10/10/2007    Past Surgical History:  Procedure Laterality Date   ABDOMINAL HYSTERECTOMY     BILATERAL VATS ABLATION  02/15/04   BIOPSY  04/17/2021   Procedure: BIOPSY;  Surgeon: Thornton Park, MD;  Location: [facility];  Service: Gastroenterology;;   CESAREAN SECTION     COLONOSCOPY WITH PROPOFOL N/A 04/17/2021   Procedure: COLONOSCOPY WITH PROPOFOL;  Surgeon: Thornton Park, MD;  Location: [facility];  Service: Gastroenterology;  Laterality: N/A;   KNEE ARTHROSCOPY WITH MEDIAL MENISECTOMY Right 04/01/2019   Procedure: KNEE ARTHROSCOPY WITH MEDIAL MENISECTOMY;  Surgeon: Hiram Gash, MD;  Location: [facility];  Service: Orthopedics;  Laterality: Right;   KNEE ARTHROSCOPY WITH MEDIAL MENISECTOMY Left 01/06/2020   Procedure: KNEE ARTHROSCOPY WITH MEDIAL MENISECTOMY;  Surgeon: Hiram Gash, MD;  Location: [facility];  Service: Orthopedics;  Laterality: Left;   LUNG BIOPSY     RIGHT HEART CATH N/A 01/30/2017   Procedure: Right Heart Cath;  Surgeon: Larey Dresser, MD;  Location: [facility];  Service: Cardiovascular;   Laterality: N/A;    OB History   No obstetric history on file.      Home Medications    Prior to Admission medications   Medication Sig Start Date End Date Taking? Authorizing Provider  molnupiravir EUA (LAGEVRIO) 200 mg CAPS capsule Take 4 capsules (800 mg total) by mouth 2 (two) times daily for 5 days. 03/08/22 03/13/22 Yes Brodey Bonn, Michele Rockers, FNP  predniSONE (STERAPRED UNI-PAK 21 TAB) 10 MG (21) TBPK tablet Take by mouth daily. Take 6 tabs by mouth daily  for 2 days, then 5 tabs for 2 days, then 4 tabs for 2 days, then 3 tabs for 2 days, 2 tabs for 2 days, then 1 tab by mouth daily for 2 days 03/08/22  Yes Home, [place] E, FNP  Accu-Chek Softclix Lancets lancets Use to check blood sugars twice a day 06/29/20   Binnie Rail, MD  albuterol (PROVENTIL HFA;VENTOLIN HFA) 108 (90 Base) MCG/ACT inhaler Inhale 2 puffs into the lungs every 4 (four) hours as needed for wheezing or shortness of breath. 07/08/17   Janne Napoleon, NP  APPLE CIDER VINEGAR PO Take 450 mg by mouth daily.    [provider]  aspirin EC 81 MG tablet Take 81 mg by mouth daily. Swallow whole.    [provider]  atorvastatin (LIPITOR) 20 MG tablet TAKE ONE TABLET BY MOUTH DAILY 03/01/22   Burns, Claudina Lick, MD  B COMPLEX VITAMINS SL Place 1 mL under the tongue 4 (four) times a week.    [provider]  Blood Glucose Monitoring Suppl (ACCU-CHEK AVIVA PLUS) w/Device KIT Use to check blood sugars twice a day 06/29/20   Binnie Rail, MD  calcium-vitamin D (OSCAL WITH D) 500-200 MG-UNIT tablet Take 1 tablet by mouth daily.    [provider]  celecoxib (CELEBREX) 200 MG capsule TAKE ONE CAPSULE BY MOUTH TWICE A DAY 10/18/21   Binnie Rail, MD  Cholecalciferol (VITAMIN D3) 50 MCG (2000 UT) capsule Take 1 capsule (2,000 Units total) by mouth daily. 09/28/20   Plotnikov, Evie Lacks, MD  Dextromethorphan-guaiFENesin (MUCINEX DM MAXIMUM STRENGTH) 60-1200 MG TB12 Take 1 tablet by mouth every 12 (twelve) hours as  needed (with flutter valve).    [provider]  ELDERBERRY PO Take 1,000 mg by mouth daily.    [provider]  famotidine (PEPCID) 40 MG tablet TAKE ONE TABLET BY MOUTH DAILY 08/21/21   Binnie Rail, MD  fluticasone (FLONASE) 50 MCG/ACT nasal spray Place 2 sprays into both nostrils daily.    [provider]  glimepiride (AMARYL) 2 MG tablet Take 1 tablet (2 mg total) by mouth daily before breakfast. 01/28/21   Burns, Claudina Lick, MD  glucose blood (ACCU-CHEK AVIVA PLUS) test strip USE TO CHECK FOR BLOOD SUGAR TWO TIMES A DAY 07/10/21   Binnie Rail, MD  JARDIANCE 25  MG TABS tablet TAKE 1 TABLET BY MOUTH EVERY MORNING WITH OR WITHOUT FOOD 12/28/21   Binnie Rail, MD  loratadine (CLARITIN) 10 MG tablet Take 10 mg by mouth at bedtime.    [provider]  losartan (COZAAR) 25 MG tablet TAKE 1/2 TABLET BY MOUTH DAILY 11/20/21   Burns, Claudina Lick, MD  mometasone-formoterol (DULERA) 100-5 MCG/ACT AERO Inhale 1 puff into the lungs 2 (two) times daily.     [provider]  montelukast (SINGULAIR) 10 MG tablet Take 1 tablet (10 mg total) by mouth at bedtime. 02/02/16   Binnie Rail, MD  OXYGEN 2lpm with sleep and 3lpm with exertion    [provider]  pantoprazole (PROTONIX) 40 MG tablet TAKE ONE TABLET BY MOUTH TWICE A DAY BEFORE MEALS 11/13/19   Binnie Rail, MD  predniSONE (DELTASONE) 5 MG tablet TAKE ONE TABLET BY MOUTH DAILY 01/26/22   Tanda Rockers, MD  repaglinide (PRANDIN) 0.5 MG tablet TAKE TWO TABLETS BY MOUTH THREE TIMES A DAY BEFORE A MEAL 03/05/22   Binnie Rail, MD  Respiratory Therapy Supplies (FLUTTER) DEVI Use as directed 07/12/17   Tanda Rockers, MD  sucralfate (CARAFATE) 1 g tablet TAKE ONE TABLET BY MOUTH FOUR TIMES A DAY WITH MEALS AND AT BEDTIME 07/19/21   Burns, Claudina Lick, MD  tirzepatide [facility]) 2.5 MG/0.5ML Pen Inject 2.5 mg into the skin once a week. 03/06/22   Burns, Claudina Lick, MD  TRULICITY 4.5 EO/7.1QR SOPN INJECT 4.5 MG UNDER  THE SKIN ONCE WEEKLY 02/23/22   Binnie Rail, MD  Turmeric 500 MG CAPS Take 500 mg by mouth daily.    [provider]  venlafaxine XR (EFFEXOR-XR) 37.5 MG 24 hr capsule TAKE ONE CAPSULE BY MOUTH DAILY WITH BREAKFAST 03/01/22   Burns, Claudina Lick, MD  VITRON-C 65-125 MG TABS Take 1 tablet by mouth daily.    [provider]    Family History Family History  Problem Relation Age of Onset   Sarcoidosis Mother        dxed by transbrochial bx   Allergies Mother    Heart disease Father    Diabetes Father    Allergies Father    Coronary artery disease Father    Cancer Maternal Grandmother        CA of unknown type   Cancer Paternal Grandmother        CA of unknown type   Colon cancer Neg Hx    Esophageal cancer Neg Hx    Rectal cancer Neg Hx    Stomach cancer Neg Hx     Social History Social History   Tobacco Use   Smoking status: Never   Smokeless tobacco: Never   Tobacco comments:    {  Vaping Use   Vaping Use: Never used  Substance Use Topics   Alcohol use: No    Comment: maybe 2 times per year   Drug use: No     Allergies   Penicillins, Metformin and related, Peach flavor, and Levaquin [levofloxacin]   Review of Systems Review of Systems Per HPI  Physical Exam Triage Vital Signs ED Triage Vitals  Enc Vitals Group     BP 03/08/22 0855 121/76     Pulse Rate 03/08/22 0855 (!) 105     Resp 03/08/22 0855 18     Temp 03/08/22 0855 98.1 F (36.7 C)     Temp src --      SpO2 03/08/22 0855 96 %  Weight --      Height --      Head Circumference --      Peak Flow --      Pain Score 03/08/22 0854 4     Pain Loc --      Pain Edu? --      Excl. in [place]? --    No data found.  Updated Vital Signs BP 121/76   Pulse (!) 105   Temp 98.1 F (36.7 C)   Resp 18   SpO2 96%   Visual Acuity Right Eye Distance:   Left Eye Distance:   Bilateral Distance:    Right Eye Near:   Left Eye Near:    Bilateral Near:     Physical Exam Constitutional:       General: She is not in acute distress.    Appearance: Normal appearance. She is not toxic-appearing or diaphoretic.  HENT:     Head: Normocephalic and atraumatic.     Right Ear: Tympanic membrane and ear canal normal.     Left Ear: Tympanic membrane and ear canal normal.     Nose: Congestion present.     Mouth/Throat:     Mouth: Mucous membranes are moist.     Pharynx: No posterior oropharyngeal erythema.  Eyes:     Extraocular Movements: Extraocular movements intact.     Conjunctiva/sclera: Conjunctivae normal.     Pupils: Pupils are equal, round, and reactive to light.  Cardiovascular:     Rate and Rhythm: Normal rate and regular rhythm.     Pulses: Normal pulses.     Heart sounds: Normal heart sounds.  Pulmonary:     Effort: Pulmonary effort is normal. No respiratory distress.     Breath sounds: Normal breath sounds. No stridor. No wheezing, rhonchi or rales.  Abdominal:     General: Abdomen is flat. Bowel sounds are normal.     Palpations: Abdomen is soft.  Musculoskeletal:        General: Normal range of motion.     Cervical back: Normal range of motion.  Skin:    General: Skin is warm and dry.  Neurological:     General: No focal deficit present.     Mental Status: She is alert and oriented to person, place, and time. Mental status is at baseline.  Psychiatric:        Mood and Affect: Mood normal.        Behavior: Behavior normal.      UC Treatments / Results  Labs (all labs ordered are listed, but only abnormal results are displayed) Labs Reviewed  NOVEL CORONAVIRUS, NAA    EKG   Radiology DG Chest 2 View  Result Date: 03/08/2022 CLINICAL DATA:  Cough EXAM: CHEST - 2 VIEW COMPARISON:  05/01/2017 FINDINGS: Cardiomegaly. Bibasilar scarring and or atelectasis. Disc degenerative disease of the thoracic spine. IMPRESSION: 1. Bibasilar scarring and or atelectasis, similar to prior examination. No acute appearing airspace disease. 2.  Cardiomegaly.  Electronically Signed   By: Delanna Ahmadi M.D.   On: 03/08/2022 09:59    Procedures Procedures (including critical care time)  Medications Ordered in UC Medications - No data to display  Initial Impression / Assessment and Plan / UC Course  I have reviewed the triage vital signs and the nursing notes.  Pertinent labs & imaging results that were available during my care of the patient were reviewed by me and considered in my medical decision making (see chart for details).  Patient tested positive for COVID-19 with an at home test.  Also exposed so highly suspicious for COVID-19.  COVID-19 PCR pending.  Will treat for COVID 19.  Patient requesting antiviral medication.  Will treat with molnupiravir given that patient takes prednisone daily and Paxlovid may interact with this.  Chest x-ray was negative for any acute abnormality.  It did show chronic changes that appears similar to previous x-rays.  Oxygen saturation is normal today and patient is not any respiratory stress so I do not think that emergent evaluation is necessary.  Although, patient was given strict ER precautions.  Will increase steroid dose to 60 mg with a taper to help alleviate symptoms and help oxygen saturation.  Patient was advised to stop taking 5 mg daily prednisone while taking a new steroid taper and to restart taking 5 mg daily steroid once the steroid taper is complete.  Patient does have diabetes but reports that she typically contacts her PCP when placed on higher dosages of steroids and they alter her medications appropriately.  She does have a glucose monitor at home and she knows to monitor her blood sugar more closely while on prednisone.  Patient advised to monitor oxygen saturation very closely as well.  Patient does appear to be very responsible regarding her healthcare.  She was given strict return and ER precautions.  Patient verbalized understanding and was agreeable with plan. Final Clinical Impressions(s) /  UC Diagnoses   Final diagnoses:  [id number]     Discharge Instructions      It is highly suspicious that you have COVID-19 given your close exposure and positive COVID test at home.  You are being treated with COVID antivirals.  Your prednisone steroid dose has also been increased to 60 mg with a taper.  Please stop taking your daily prednisone and restart after you complete the prednisone taper.  Please monitor blood sugar more closely and follow-up with primary care doctor as you have before if dosage adjustments need be made to your diabetes medication.  Please go the hospital if symptoms persist or worsen and continue to monitor oxygen.    ED Prescriptions     Medication Sig Dispense Auth. Provider   predniSONE (STERAPRED UNI-PAK 21 TAB) 10 MG (21) TBPK tablet Take by mouth daily. Take 6 tabs by mouth daily  for 2 days, then 5 tabs for 2 days, then 4 tabs for 2 days, then 3 tabs for 2 days, 2 tabs for 2 days, then 1 tab by mouth daily for 2 days 42 tablet [place], [place] E, [place]   molnupiravir EUA (LAGEVRIO) 200 mg CAPS capsule Take 4 capsules (800 mg total) by mouth 2 (two) times daily for 5 days. 40 capsule [place], Michele Rockers, [place]      PDMP not reviewed this encounter.   Teodora Medici, [place] 03/08/22 1137

## 2022-03-08 NOTE — Discharge Instructions (Signed)
It is highly suspicious that you have COVID-19 given your close exposure and positive COVID test at home.  You are being treated with COVID antivirals.  Your prednisone steroid dose has also been increased to 60 mg with a taper.  Please stop taking your daily prednisone and restart after you complete the prednisone taper.  Please monitor blood sugar more closely and follow-up with primary care doctor as you have before if dosage adjustments need be made to your diabetes medication.  Please go the hospital if symptoms persist or worsen and continue to monitor oxygen.

## 2022-03-09 ENCOUNTER — Telehealth: Payer: Self-pay | Admitting: Internal Medicine

## 2022-03-09 LAB — NOVEL CORONAVIRUS, NAA: SARS-CoV-2, NAA: DETECTED — AB

## 2022-03-09 NOTE — Telephone Encounter (Signed)
Have her increase the glimepiride as needed - have her increase to three pill in the morning now (6 mg) and send me a note about her sugars.  As sugars start to lower - decrease to 4 mg and then 2 mg as needed

## 2022-03-09 NOTE — Telephone Encounter (Signed)
Patient sent My Chart message yesterday 03/08/2022 - she was given high doses of steroids from urgent care - patient states that her blood sugars are extremely elevated.  Patient states that previously you have given her additional medications to combat this.  Please call  patient - Blood sugar is at 195

## 2022-03-09 NOTE — Telephone Encounter (Signed)
Message left and also sent my-chart message to her as well with info.

## 2022-03-12 ENCOUNTER — Other Ambulatory Visit: Payer: Self-pay | Admitting: Internal Medicine

## 2022-03-26 DIAGNOSIS — R091 Pleurisy: Secondary | ICD-10-CM | POA: Diagnosis not present

## 2022-03-26 DIAGNOSIS — M722 Plantar fascial fibromatosis: Secondary | ICD-10-CM | POA: Diagnosis not present

## 2022-03-28 ENCOUNTER — Other Ambulatory Visit: Payer: Self-pay | Admitting: Internal Medicine

## 2022-04-03 ENCOUNTER — Other Ambulatory Visit: Payer: Self-pay | Admitting: Internal Medicine

## 2022-04-25 DIAGNOSIS — R091 Pleurisy: Secondary | ICD-10-CM | POA: Diagnosis not present

## 2022-04-25 DIAGNOSIS — M722 Plantar fascial fibromatosis: Secondary | ICD-10-CM | POA: Diagnosis not present

## 2022-05-26 ENCOUNTER — Other Ambulatory Visit: Payer: Self-pay | Admitting: Internal Medicine

## 2022-05-26 DIAGNOSIS — R091 Pleurisy: Secondary | ICD-10-CM | POA: Diagnosis not present

## 2022-05-26 DIAGNOSIS — M722 Plantar fascial fibromatosis: Secondary | ICD-10-CM | POA: Diagnosis not present

## 2022-05-28 ENCOUNTER — Ambulatory Visit (INDEPENDENT_AMBULATORY_CARE_PROVIDER_SITE_OTHER): Payer: Medicare Other | Admitting: Sports Medicine

## 2022-05-28 ENCOUNTER — Ambulatory Visit: Payer: Self-pay

## 2022-05-28 VITALS — BP 138/80 | HR 81 | Ht 60.0 in | Wt 250.0 lb

## 2022-05-28 DIAGNOSIS — M25571 Pain in right ankle and joints of right foot: Secondary | ICD-10-CM

## 2022-05-28 DIAGNOSIS — G8929 Other chronic pain: Secondary | ICD-10-CM | POA: Diagnosis not present

## 2022-05-28 DIAGNOSIS — M76821 Posterior tibial tendinitis, right leg: Secondary | ICD-10-CM

## 2022-05-28 NOTE — Patient Instructions (Signed)
Good to see you  Ankle HEP 3-4 week follow up if no improvement call in 1 week

## 2022-05-28 NOTE — Progress Notes (Addendum)
[name] D.[place] Phone: [phone]   Assessment and Plan:     1. Chronic pain of right ankle -Chronic with exacerbation, initial sports medicine visit - Patient complaining of recurrence medial right ankle pain.  She had 1 episode about 4 to 5 years ago that resolved after medial ankle CSI.  She states that this feels identical to that time - Most consistent with posterior tibialis tendinitis at posterior medial malleolus based on physical exam, HPI, ultrasound showing edema around posterior tibialis and flexor digitorum tendons - Patient elected for CSI.  Tolerated well per note below.  With past medical history of DM type II, CSI may temporarily increase patient's blood glucose - Recommend relative rest, wearing comfortable and supportive tennis shoes at all times when ambulatory, and Tylenol as needed - [place] LIMITED JOINT SPACE STRUCTURES LOW RIGHT(NO LINKED CHARGES); Future   Procedure: Ultrasound Guided Posterior Tibialis Sheath Injection  Side: Right Diagnosis: Medial ankle pain [place] Indication:  - accuracy is paramount for diagnosis - to ensure therapeutic efficacy or procedural success - to reduce procedural risk  After explaining the procedure, viable alternatives, risks, and answering any questions, consent was given verbally. The site was cleaned with chlorhexidine prep. An ultrasound transducer was placed on the medial ankle and posterior tibialis was identified.  Care was taken to avoid tibial nerve and vascular structures in the tarsal tunnel.  A needle was introduced into the area adjacent to the posterior tib in the tarsal tunnel under ultrasound guidance with sterile technique.    A steroid injection was performed using 45m of 1% lidocaine without epinephrine and 40 mg of triamcinolone (KENALOG) 460mml. This was well tolerated and resulted in  relief.  Needle was removed and dressing placed  and post injection instructions were given including a discussion of likely return of pain today after the anesthetic wears off (with the possibility of worsened pain) until the steroid starts to work in 1-3 days.   Pt was advised to call or return to clinic if these symptoms worsen or fail to improve as anticipated.   Pertinent previous records reviewed include none   Follow Up: 3 to 4 weeks for reevaluation.  Would obtain ankle x-ray if no improvement or worsening of symptoms   Subjective:   I, Moenique Parris, am serving as a scEducation administratoror Doctor BeGlennon MacChief Complaint: right ankle pain  HPI:   05/28/22 Patient is a [age]ear old female complaining of right ankle pain. Patient states that she has arthritis and knows shes obese has osteopenia , pain has continued and she is medicating to much , medial ankle pain , fees like a hot nail being inserted , pain is happening in her sleep now,has used prednisone chronic , states she needs cortisone , has used heat, ice and tylenol, Friday used 1566m 10 mg,5 mgof prednisone that was the only way she could get through the weekend, thinks she needs CSI    Relevant Historical Information: Hypertension, GERD, DM type II, interstitial lung disease  Additional pertinent review of systems negative.   Current Outpatient Medications:    Accu-Chek Softclix Lancets lancets, Use to check blood sugars twice a day, Disp: 100 each, Rfl: 12   albuterol (PROVENTIL HFA;VENTOLIN HFA) 108 (90 Base) MCG/ACT inhaler, Inhale 2 puffs into the lungs every 4 (four) hours as needed for wheezing or shortness of breath., Disp: 1 Inhaler, Rfl: 0   APPLE  CIDER VINEGAR PO, Take 450 mg by mouth daily., Disp: , Rfl:    aspirin EC 81 MG tablet, Take 81 mg by mouth daily. Swallow whole., Disp: , Rfl:    atorvastatin (LIPITOR) 20 MG tablet, TAKE ONE TABLET BY MOUTH DAILY, Disp: 90 tablet, Rfl: 1   B COMPLEX VITAMINS SL, Place 1 mL under the tongue 4 (four) times a week.,  Disp: , Rfl:    Blood Glucose Monitoring Suppl (ACCU-CHEK AVIVA PLUS) w/Device KIT, Use to check blood sugars twice a day, Disp: 1 kit, Rfl: 0   calcium-vitamin D (OSCAL WITH D) 500-200 MG-UNIT tablet, Take 1 tablet by mouth daily., Disp: , Rfl:    celecoxib (CELEBREX) 200 MG capsule, TAKE ONE CAPSULE BY MOUTH TWICE A DAY, Disp: 60 capsule, Rfl: 5   Cholecalciferol (VITAMIN D3) 50 MCG (2000 UT) capsule, Take 1 capsule (2,000 Units total) by mouth daily., Disp: 100 capsule, Rfl: 3   Dextromethorphan-guaiFENesin (MUCINEX DM MAXIMUM STRENGTH) 60-1200 MG TB12, Take 1 tablet by mouth every 12 (twelve) hours as needed (with flutter valve)., Disp: , Rfl:    ELDERBERRY PO, Take 1,000 mg by mouth daily., Disp: , Rfl:    famotidine (PEPCID) 40 MG tablet, TAKE ONE TABLET BY MOUTH DAILY, Disp: 30 tablet, Rfl: 0   fluticasone (FLONASE) 50 MCG/ACT nasal spray, Place 2 sprays into both nostrils daily., Disp: , Rfl:    glimepiride (AMARYL) 2 MG tablet, TAKE ONE TABLET BY MOUTH DAILY BEFORE BREAKFAST, Disp: 30 tablet, Rfl: 3   glucose blood (ACCU-CHEK AVIVA PLUS) test strip, USE TO CHECK FOR BLOOD SUGAR TWO TIMES A DAY, Disp: 100 strip, Rfl: 0   JARDIANCE 25 MG TABS tablet, TAKE 1 TABLET BY MOUTH EVERY MORNING WITH OR WITHOUT FOOD, Disp: 90 tablet, Rfl: 0   loratadine (CLARITIN) 10 MG tablet, Take 10 mg by mouth at bedtime., Disp: , Rfl:    losartan (COZAAR) 25 MG tablet, TAKE 1/2 TABLET BY MOUTH DAILY, Disp: 45 tablet, Rfl: 1   mometasone-formoterol (DULERA) 100-5 MCG/ACT AERO, Inhale 1 puff into the lungs 2 (two) times daily. , Disp: , Rfl:    montelukast (SINGULAIR) 10 MG tablet, Take 1 tablet (10 mg total) by mouth at bedtime., Disp: 30 tablet, Rfl: 5   OXYGEN, 2lpm with sleep and 3lpm with exertion, Disp: , Rfl:    pantoprazole (PROTONIX) 40 MG tablet, TAKE ONE TABLET BY MOUTH TWICE A DAY BEFORE MEALS, Disp: 120 tablet, Rfl: 1   predniSONE (DELTASONE) 5 MG tablet, TAKE ONE TABLET BY MOUTH DAILY, Disp: 90  tablet, Rfl: 2   repaglinide (PRANDIN) 0.5 MG tablet, TAKE TWO TABLETS BY MOUTH THREE TIMES A DAY BEFORE A MEAL, Disp: 180 tablet, Rfl: 0   Respiratory Therapy Supplies (FLUTTER) DEVI, Use as directed, Disp: 1 each, Rfl: 0   sucralfate (CARAFATE) 1 g tablet, TAKE ONE TABLET BY MOUTH FOUR TIMES A DAY WITH MEALS AND AT BEDTIME, Disp: 40 tablet, Rfl: 0   TRULICITY 4.5 JS/2.8BT SOPN, INJECT 4.5 MG UNDER THE SKIN ONCE WEEKLY, Disp: 2 mL, Rfl: 1   Turmeric 500 MG CAPS, Take 500 mg by mouth daily., Disp: , Rfl:    venlafaxine XR (EFFEXOR-XR) 37.5 MG 24 hr capsule, TAKE ONE CAPSULE BY MOUTH DAILY WITH BREAKFAST, Disp: 90 capsule, Rfl: 2   VITRON-C 65-125 MG TABS, Take 1 tablet by mouth daily., Disp: , Rfl:    predniSONE (STERAPRED UNI-PAK 21 TAB) 10 MG (21) TBPK tablet, Take by mouth daily. Take 6 tabs by  mouth daily  for 2 days, then 5 tabs for 2 days, then 4 tabs for 2 days, then 3 tabs for 2 days, 2 tabs for 2 days, then 1 tab by mouth daily for 2 days, Disp: 42 tablet, Rfl: 0   tirzepatide (MOUNJARO) 2.5 MG/0.5ML Pen, Inject 2.5 mg into the skin once a week., Disp: 2 mL, Rfl: 0   Objective:     Vitals:   05/28/22 1356  BP: 138/80  Pulse: 81  SpO2: 98%  Weight: 250 lb (113.4 kg)  Height: 5' (1.524 m)      Body mass index is 48.82 kg/m.    Physical Exam:    Gen: Appears well, nad, nontoxic and pleasant Psych: Alert and oriented, appropriate mood and affect Neuro: sensation intact, strength is 5/5 with df/pf/inv/ev, muscle tone wnl Skin: no susupicious lesions or rashes  Right ankle: no deformity, no swelling or effusion TTP significantly posterior to medial malleolus along posterior tibialis tendon NTTP over fibular head, lat mal, medial mal, achilles, navicular, base of 5th, ATFL, CFL, deltoid, calcaneous or midfoot ROM DF 30, PF 45, inv/ev intact Negative ant drawer, talar tilt, rotation test, squeeze test. Neg thompson Mild pain with resisted inversion or eversion     Electronically signed by:  [name] D.[facility] 2:43 PM 05/28/22

## 2022-06-06 ENCOUNTER — Encounter: Payer: Self-pay | Admitting: Internal Medicine

## 2022-06-06 NOTE — Progress Notes (Signed)
Subjective:    Patient ID: [name], female    DOB: 1971-03-07, 51 y.o.   MRN: [id number]      HPI [name] is here for a Physical exam.   Brain fog since last episode of covid, 3 months ago.  The fatigue is better.  The brain fog has not improved.   On and off higher doses of prednisone with humidity, COVID-that has affected her sugar some.  Injection in right ankle.   Sugars in am 120-160  Medications and allergies reviewed with patient and updated if appropriate.  Current Outpatient Medications on File Prior to Visit  Medication Sig Dispense Refill   Accu-Chek Softclix Lancets lancets Use to check blood sugars twice a day 100 each 12   albuterol (PROVENTIL HFA;VENTOLIN HFA) 108 (90 Base) MCG/ACT inhaler Inhale 2 puffs into the lungs every 4 (four) hours as needed for wheezing or shortness of breath. 1 Inhaler 0   APPLE CIDER VINEGAR PO Take 450 mg by mouth daily.     aspirin EC 81 MG tablet Take 81 mg by mouth daily. Swallow whole.     atorvastatin (LIPITOR) 20 MG tablet TAKE ONE TABLET BY MOUTH DAILY 90 tablet 1   B COMPLEX VITAMINS SL Place 1 mL under the tongue 4 (four) times a week.     Blood Glucose Monitoring Suppl (ACCU-CHEK AVIVA PLUS) w/Device KIT Use to check blood sugars twice a day 1 kit 0   calcium-vitamin D (OSCAL WITH D) 500-200 MG-UNIT tablet Take 1 tablet by mouth daily.     celecoxib (CELEBREX) 200 MG capsule TAKE ONE CAPSULE BY MOUTH TWICE A DAY 60 capsule 5   Cholecalciferol (VITAMIN D3) 50 MCG (2000 UT) capsule Take 1 capsule (2,000 Units total) by mouth daily. 100 capsule 3   Dextromethorphan-guaiFENesin (MUCINEX DM MAXIMUM STRENGTH) 60-1200 MG TB12 Take 1 tablet by mouth every 12 (twelve) hours as needed (with flutter valve).     ELDERBERRY PO Take 1,000 mg by mouth daily.     famotidine (PEPCID) 40 MG tablet TAKE ONE TABLET BY MOUTH DAILY 30 tablet 0   fluticasone (FLONASE) 50 MCG/ACT nasal spray Place 2 sprays into both nostrils daily.     glucose  blood (ACCU-CHEK AVIVA PLUS) test strip USE TO CHECK FOR BLOOD SUGAR TWO TIMES A DAY 100 strip 0   JARDIANCE 25 MG TABS tablet TAKE 1 TABLET BY MOUTH EVERY MORNING WITH OR WITHOUT FOOD 90 tablet 0   loratadine (CLARITIN) 10 MG tablet Take 10 mg by mouth at bedtime.     losartan (COZAAR) 25 MG tablet TAKE 1/2 TABLET BY MOUTH DAILY 45 tablet 1   meloxicam (MOBIC) 15 MG tablet      mometasone-formoterol (DULERA) 100-5 MCG/ACT AERO Inhale 1 puff into the lungs 2 (two) times daily.      montelukast (SINGULAIR) 10 MG tablet Take 1 tablet (10 mg total) by mouth at bedtime. 30 tablet 5   ondansetron (ZOFRAN) 4 MG tablet      OXYGEN 2lpm with sleep and 3lpm with exertion     pantoprazole (PROTONIX) 40 MG tablet TAKE ONE TABLET BY MOUTH TWICE A DAY BEFORE MEALS 120 tablet 1   predniSONE (DELTASONE) 5 MG tablet TAKE ONE TABLET BY MOUTH DAILY 90 tablet 2   repaglinide (PRANDIN) 0.5 MG tablet TAKE TWO TABLETS BY MOUTH THREE TIMES A DAY BEFORE A MEAL 180 tablet 0   Respiratory Therapy Supplies (FLUTTER) DEVI Use as directed 1 each 0  sucralfate (CARAFATE) 1 g tablet TAKE ONE TABLET BY MOUTH FOUR TIMES A DAY WITH MEALS AND AT BEDTIME 40 tablet 0   TRULICITY 4.5 AQ/7.6AU SOPN INJECT 4.5 MG UNDER THE SKIN ONCE WEEKLY 2 mL 1   Turmeric 500 MG CAPS Take 500 mg by mouth daily.     venlafaxine XR (EFFEXOR-XR) 37.5 MG 24 hr capsule TAKE ONE CAPSULE BY MOUTH DAILY WITH BREAKFAST 90 capsule 2   VITRON-C 65-125 MG TABS Take 1 tablet by mouth daily.     glimepiride (AMARYL) 2 MG tablet TAKE ONE TABLET BY MOUTH DAILY BEFORE BREAKFAST (Patient not taking: Reported on 06/07/2022) 30 tablet 3   No current facility-administered medications on file prior to visit.    Review of Systems  Constitutional:  Negative for fever.  Eyes:  Negative for visual disturbance.  Respiratory:  Positive for cough (daily - at baseline) and wheezing (daily - at baseline). Negative for shortness of breath.   Cardiovascular:  Negative for  chest pain, palpitations and leg swelling.  Gastrointestinal:  Positive for nausea (occ). Negative for abdominal pain, blood in stool, constipation and diarrhea.       No gerd  Genitourinary:  Negative for dysuria.  Musculoskeletal:  Positive for arthralgias (right ankle). Negative for back pain.  Skin:  Negative for rash.  Neurological:  Negative for light-headedness and headaches.  Psychiatric/Behavioral:  Negative for dysphoric mood. The patient is nervous/anxious.        Brain fog since having covid 03/2022       Objective:   Vitals:   06/07/22 0921  BP: 122/78  Pulse: 88  Temp: 98.2 F (36.8 C)  SpO2: 99%   Filed Weights   06/07/22 0921  Weight: 250 lb (113.4 kg)   Body mass index is 48.82 kg/m.  BP Readings from Last 3 Encounters:  06/07/22 122/78  05/28/22 138/80  03/08/22 121/76    Wt Readings from Last 3 Encounters:  06/07/22 250 lb (113.4 kg)  05/28/22 250 lb (113.4 kg)  03/06/22 250 lb (113.4 kg)       Physical Exam Constitutional: She appears well-developed and well-nourished. No distress.  HENT:  Head: Normocephalic and atraumatic.  Right Ear: External ear normal. Normal ear canal and TM Left Ear: External ear normal.  Normal ear canal and TM Mouth/Throat: Oropharynx is clear and moist.  Eyes: Conjunctivae normal.  Neck: Neck supple. No tracheal deviation present. No thyromegaly present.  No carotid bruit  Cardiovascular: Normal rate, regular rhythm and normal heart sounds.   No murmur heard.  No edema. Pulmonary/Chest: Effort normal and breath sounds normal. No respiratory distress. She has no wheezes. She has no rales.  Breast: deferred   Abdominal: Soft. She exhibits no distension. There is no tenderness.  Lymphadenopathy: She has no cervical adenopathy.  Skin: Skin is warm and dry. She is not diaphoretic.  Psychiatric: She has a normal mood and affect. Her behavior is normal.     Lab Results  Component Value Date   WBC 7.8 05/16/2021    HGB 14.4 05/16/2021   HCT 43.9 05/16/2021   PLT 230.0 05/16/2021   GLUCOSE 170 (H) 03/06/2022   CHOL 168 03/06/2022   TRIG 91.0 03/06/2022   HDL 49.70 03/06/2022   LDLDIRECT 153.1 12/08/2012   LDLCALC 100 (H) 03/06/2022   ALT 21 03/06/2022   AST 22 03/06/2022   NA 138 03/06/2022   K 3.6 03/06/2022   CL 101 03/06/2022   CREATININE 0.60 03/06/2022   BUN 16  03/06/2022   CO2 28 03/06/2022   TSH 1.25 05/09/2017   INR 1.01 01/30/2017   HGBA1C 8.9 (H) 03/06/2022   MICROALBUR 1.1 05/16/2016         Assessment & Plan:   Physical exam: Screening blood work  ordered Exercise  active, irregular exercise Weight stressed weight loss Substance abuse  none   Reviewed recommended immunizations.   Health Maintenance  Topic Date Due   Diabetic kidney evaluation - Urine ACR  05/16/2017   OPHTHALMOLOGY EXAM  10/05/2021   FOOT EXAM  01/10/2022   MAMMOGRAM  03/29/2022   COVID-19 Vaccine (4 - Moderna series) 06/23/2022 (Originally 08/15/2020)   Zoster Vaccines- Shingrix (1 of 2) 09/06/2022 (Originally 03/29/2022)   INFLUENZA VACCINE  12/30/2022 (Originally 05/01/2022)   TETANUS/TDAP  06/08/2023 (Originally 03/25/2019)   HEMOGLOBIN A1C  09/05/2022   Diabetic kidney evaluation - GFR measurement  03/07/2023   COLONOSCOPY (Pts 45-86yr Insurance coverage will need to be confirmed)  04/18/2031   Hepatitis C Screening  Completed   HIV Screening  Completed   HPV VACCINES  Aged Out          See Problem List for Assessment and Plan of chronic medical problems.

## 2022-06-06 NOTE — Patient Instructions (Addendum)
Call and schedule your mammogram - [facility] Schedule an appointment by calling [phone]    Blood work was ordered.     Medications changes include :   mounjaro 2.5 mg weekly   Your prescription(s) have been sent to your pharmacy.    Return in about 4 months (around 10/07/2022) for follow up.   Health Maintenance, Female Adopting a healthy lifestyle and getting preventive care are important in promoting health and wellness. Ask your health care provider about: The right schedule for you to have regular tests and exams. Things you can do on your own to prevent diseases and keep yourself healthy. What should I know about diet, weight, and exercise? Eat a healthy diet  Eat a diet that includes plenty of vegetables, fruits, low-fat dairy products, and lean protein. Do not eat a lot of foods that are high in solid fats, added sugars, or sodium. Maintain a healthy weight Body mass index (BMI) is used to identify weight problems. It estimates body fat based on height and weight. Your health care provider can help determine your BMI and help you achieve or maintain a healthy weight. Get regular exercise Get regular exercise. This is one of the most important things you can do for your health. Most adults should: Exercise for at least 150 minutes each week. The exercise should increase your heart rate and make you sweat (moderate-intensity exercise). Do strengthening exercises at least twice a week. This is in addition to the moderate-intensity exercise. Spend less time sitting. Even light physical activity can be beneficial. Watch cholesterol and blood lipids Have your blood tested for lipids and cholesterol at 50 years of age, then have this test every 5 years. Have your cholesterol levels checked more often if: Your lipid or cholesterol levels are high. You are older than 50 years of age. You are at high risk for heart disease. What should I  know about cancer screening? Depending on your health history and family history, you may need to have cancer screening at various ages. This may include screening for: Breast cancer. Cervical cancer. Colorectal cancer. Skin cancer. Lung cancer. What should I know about heart disease, diabetes, and high blood pressure? Blood pressure and heart disease High blood pressure causes heart disease and increases the risk of stroke. This is more likely to develop in people who have high blood pressure readings or are overweight. Have your blood pressure checked: Every 3-5 years if you are 94-62 years of age. Every year if you are 57 years old or older. Diabetes Have regular diabetes screenings. This checks your fasting blood sugar level. Have the screening done: Once every three years after age 26 if you are at a normal weight and have a low risk for diabetes. More often and at a younger age if you are overweight or have a high risk for diabetes. What should I know about preventing infection? Hepatitis B If you have a higher risk for hepatitis B, you should be screened for this virus. Talk with your health care provider to find out if you are at risk for hepatitis B infection. Hepatitis C Testing is recommended for: Everyone born from [birth year] through 1964. Anyone with known risk factors for hepatitis C. Sexually transmitted infections (STIs) Get screened for STIs, including gonorrhea and chlamydia, if: You are sexually active and are younger than 50 years of age. You are older than 50 years of age and your health care provider tells you  that you are at risk for this type of infection. Your sexual activity has changed since you were last screened, and you are at increased risk for chlamydia or gonorrhea. Ask your health care provider if you are at risk. Ask your health care provider about whether you are at high risk for HIV. Your health care provider may recommend a prescription medicine to help  prevent HIV infection. If you choose to take medicine to prevent HIV, you should first get tested for HIV. You should then be tested every 3 months for as long as you are taking the medicine. Pregnancy If you are about to stop having your period (premenopausal) and you may become pregnant, seek counseling before you get pregnant. Take 400 to 800 micrograms (mcg) of folic acid every day if you become pregnant. Ask for birth control (contraception) if you want to prevent pregnancy. Osteoporosis and menopause Osteoporosis is a disease in which the bones lose minerals and strength with aging. This can result in bone fractures. If you are 47 years old or older, or if you are at risk for osteoporosis and fractures, ask your health care provider if you should: Be screened for bone loss. Take a calcium or vitamin D supplement to lower your risk of fractures. Be given hormone replacement therapy (HRT) to treat symptoms of menopause. Follow these instructions at home: Alcohol use Do not drink alcohol if: Your health care provider tells you not to drink. You are pregnant, may be pregnant, or are planning to become pregnant. If you drink alcohol: Limit how much you have to: 0-1 drink a day. Know how much alcohol is in your drink. In the [place] one drink equals one 12 oz bottle of beer (355 mL), one 5 oz glass of wine (148 mL), or one 1 oz glass of hard liquor (44 mL). Lifestyle Do not use any products that contain nicotine or tobacco. These products include cigarettes, chewing tobacco, and vaping devices, such as e-cigarettes. If you need help quitting, ask your health care provider. Do not use street drugs. Do not share needles. Ask your health care provider for help if you need support or information about quitting drugs. General instructions Schedule regular health, dental, and eye exams. Stay current with your vaccines. Tell your health care provider if: You often feel depressed. You have ever  been abused or do not feel safe at home. Summary Adopting a healthy lifestyle and getting preventive care are important in promoting health and wellness. Follow your health care provider's instructions about healthy diet, exercising, and getting tested or screened for diseases. Follow your health care provider's instructions on monitoring your cholesterol and blood pressure. This information is not intended to replace advice given to you by your health care provider. Make sure you discuss any questions you have with your health care provider. Document Revised: 02/06/2021 Document Reviewed: 02/06/2021 Elsevier Patient Education  [place].

## 2022-06-07 ENCOUNTER — Ambulatory Visit (INDEPENDENT_AMBULATORY_CARE_PROVIDER_SITE_OTHER): Payer: Medicare Other | Admitting: Internal Medicine

## 2022-06-07 VITALS — BP 122/78 | HR 88 | Temp 98.2°F | Ht 60.0 in | Wt 250.0 lb

## 2022-06-07 DIAGNOSIS — I10 Essential (primary) hypertension: Secondary | ICD-10-CM | POA: Diagnosis not present

## 2022-06-07 DIAGNOSIS — G8929 Other chronic pain: Secondary | ICD-10-CM

## 2022-06-07 DIAGNOSIS — M25562 Pain in left knee: Secondary | ICD-10-CM

## 2022-06-07 DIAGNOSIS — E785 Hyperlipidemia, unspecified: Secondary | ICD-10-CM

## 2022-06-07 DIAGNOSIS — Z0000 Encounter for general adult medical examination without abnormal findings: Secondary | ICD-10-CM | POA: Diagnosis not present

## 2022-06-07 DIAGNOSIS — E1165 Type 2 diabetes mellitus with hyperglycemia: Secondary | ICD-10-CM | POA: Diagnosis not present

## 2022-06-07 DIAGNOSIS — F419 Anxiety disorder, unspecified: Secondary | ICD-10-CM

## 2022-06-07 LAB — LIPID PANEL
Cholesterol: 189 mg/dL (ref 0–200)
HDL: 56.2 mg/dL (ref >39.00)
LDL Cholesterol: 120 mg/dL — ABNORMAL HIGH (ref 0–99)
NonHDL: 132.85
Total CHOL/HDL Ratio: 3
Triglycerides: 63 mg/dL (ref 0.0–149.0)
VLDL: 12.6 mg/dL (ref 0.0–40.0)

## 2022-06-07 LAB — COMPREHENSIVE METABOLIC PANEL
ALT: 25 U/L (ref 0–35)
AST: 23 U/L (ref 0–37)
Albumin: 3.9 g/dL (ref 3.5–5.2)
Alkaline Phosphatase: 87 U/L (ref 39–117)
BUN: 13 mg/dL (ref 6–23)
CO2: 31 mEq/L (ref 19–32)
Calcium: 9.8 mg/dL (ref 8.4–10.5)
Chloride: 100 mEq/L (ref 96–112)
Creatinine, Ser: 0.7 mg/dL (ref 0.40–1.20)
GFR: 101.01 mL/min (ref >60.00)
Glucose, Bld: 154 mg/dL — ABNORMAL HIGH (ref 70–99)
Potassium: 3.8 mEq/L (ref 3.5–5.1)
Sodium: 139 mEq/L (ref 135–145)
Total Bilirubin: 0.3 mg/dL (ref 0.2–1.2)
Total Protein: 7.7 g/dL (ref 6.0–8.3)

## 2022-06-07 LAB — CBC WITH DIFFERENTIAL/PLATELET
Basophils Absolute: 0 10*3/uL (ref 0.0–0.1)
Basophils Relative: 0.3 % (ref 0.0–3.0)
Eosinophils Absolute: 0.1 10*3/uL (ref 0.0–0.7)
Eosinophils Relative: 1.1 % (ref 0.0–5.0)
HCT: 45.9 % (ref 36.0–46.0)
Hemoglobin: 14.7 g/dL (ref 12.0–15.0)
Lymphocytes Relative: 39.8 % (ref 12.0–46.0)
Lymphs Abs: 2.6 10*3/uL (ref 0.7–4.0)
MCHC: 32 g/dL (ref 30.0–36.0)
MCV: 93.6 fl (ref 78.0–100.0)
Monocytes Absolute: 0.6 10*3/uL (ref 0.1–1.0)
Monocytes Relative: 8.7 % (ref 3.0–12.0)
Neutro Abs: 3.3 10*3/uL (ref 1.4–7.7)
Neutrophils Relative %: 50.1 % (ref 43.0–77.0)
Platelets: 190 10*3/uL (ref 150.0–400.0)
RBC: 4.9 Mil/uL (ref 3.87–5.11)
RDW: 14.9 % (ref 11.5–15.5)
WBC: 6.6 10*3/uL (ref 4.0–10.5)

## 2022-06-07 LAB — MICROALBUMIN / CREATININE URINE RATIO
Creatinine,U: 43.1 mg/dL
Microalb Creat Ratio: 1.6 mg/g (ref 0.0–30.0)
Microalb, Ur: <0.7 mg/dL (ref 0.0–1.9)

## 2022-06-07 LAB — HEMOGLOBIN A1C: Hgb A1c MFr Bld: 10.3 % — ABNORMAL HIGH (ref 4.6–6.5)

## 2022-06-07 LAB — TSH: TSH: 1.19 u[IU]/mL (ref 0.35–5.50)

## 2022-06-07 MED ORDER — TIRZEPATIDE 2.5 MG/0.5ML ~~LOC~~ SOAJ: 2.5000 mg | SUBCUTANEOUS | 0 refills | Status: DC

## 2022-06-07 NOTE — Assessment & Plan Note (Signed)
Chronic °Regular exercise and healthy diet encouraged °Check lipid panel  °Continue atorvastatin 20 mg daily °

## 2022-06-07 NOTE — Assessment & Plan Note (Signed)
Chronic Blood pressure well controlled CMP Continue losartan 12.5 mg daily 

## 2022-06-07 NOTE — Assessment & Plan Note (Signed)
Chronic Controlled, Stable Continue venlafaxine 37.5 mg daily 

## 2022-06-07 NOTE — Assessment & Plan Note (Addendum)
Chronic  Lab Results  Component Value Date   HGBA1C 8.9 (H) 03/06/2022   Sugars not well controlled Testing sugars 1 times a day Check A1c, urine microalbumin today Continue Jardiance 25 mg daily,  Prandin 1 mg 3 times daily before meal, Trulicity to 4.5 mg weekly Stressed regular exercise, diabetic diet  Sugars most likely not controlled.  We will see if we can get [facility] covered.  She has not tolerated metformin in the past and did not tolerate glimepiride well which caused weight gain.

## 2022-06-07 NOTE — Assessment & Plan Note (Signed)
Chronic Secondary to osteoarthritis Encouraged weight loss Continue Celebrex 200 mg twice daily as needed-typically takes once a day

## 2022-06-07 NOTE — Assessment & Plan Note (Addendum)
Chronic Continue Trulicity 4.5 mg weekly-unfortunately not very effective for her weight We will see if we can get Mounjaro covered to help better control her sugars and her weight Stressed decreased portions, diet low in sugars and carbs Encouraged regular exercise-30 minutes 5 days a week

## 2022-06-09 ENCOUNTER — Other Ambulatory Visit: Payer: Self-pay | Admitting: Internal Medicine

## 2022-06-11 ENCOUNTER — Other Ambulatory Visit: Payer: Self-pay | Admitting: Internal Medicine

## 2022-06-13 ENCOUNTER — Telehealth: Payer: Self-pay

## 2022-06-13 NOTE — Telephone Encounter (Signed)
Aysel Schall (Key: [id number])  Your information has been submitted to [place]. To check for an updated outcome later, reopen this PA request from your dashboard.  If [employer] has not responded to your request within 24 hours, contact Ashley at [phone]. If you think there may be a problem with your PA request, use our live chat feature at the bottom right.

## 2022-06-14 NOTE — Progress Notes (Deleted)
  Ben Jackson D.O. CAQ[facility] [address] [place] [address] Phone: [phone]   Assessment and Plan:     There are no diagnoses linked to this encounter.  ***   Pertinent previous records reviewed include ***   Follow Up: ***     Subjective:   I, [name], am serving as a scribe for Doctor Benjamin Jackson   Chief Complaint: right ankle pain   HPI:    05/28/22 Patient is a 51 year old female complaining of right ankle pain. Patient states that she has arthritis and knows shes obese has osteopenia , pain has continued and she is medicating to much , medial ankle pain , fees like a hot nail being inserted , pain is happening in her sleep now,has used prednisone chronic , states she needs cortisone , has used heat, ice and tylenol, Friday used 15mg , 10 mg,5 mgof prednisone that was the only way she could get through the weekend, thinks she needs CSI    06/19/2022 Patient states    Relevant Historical Information: Hypertension, GERD, DM type II, interstitial lung disease  Additional pertinent review of systems negative.   Current Outpatient Medications:    Accu-Chek Softclix Lancets lancets, Use to check blood sugars twice a day, Disp: 100 each, Rfl: 12   albuterol (PROVENTIL HFA;VENTOLIN HFA) 108 (90 Base) MCG/ACT inhaler, Inhale 2 puffs into the lungs every 4 (four) hours as needed for wheezing or shortness of breath., Disp: 1 Inhaler, Rfl: 0   APPLE CIDER VINEGAR PO, Take 450 mg by mouth daily., Disp: , Rfl:    aspirin EC 81 MG tablet, Take 81 mg by mouth daily. Swallow whole., Disp: , Rfl:    atorvastatin (LIPITOR) 20 MG tablet, TAKE ONE TABLET BY MOUTH DAILY, Disp: 90 tablet, Rfl: 1   B COMPLEX VITAMINS SL, Place 1 mL under the tongue 4 (four) times a week., Disp: , Rfl:    Blood Glucose Monitoring Suppl (ACCU-CHEK AVIVA PLUS) w/Device KIT, Use to check blood sugars twice a day, Disp: 1 kit, Rfl: 0   calcium-vitamin D  (OSCAL WITH D) 500-200 MG-UNIT tablet, Take 1 tablet by mouth daily., Disp: , Rfl:    celecoxib (CELEBREX) 200 MG capsule, TAKE ONE CAPSULE BY MOUTH TWICE A DAY, Disp: 60 capsule, Rfl: 5   Cholecalciferol (VITAMIN D3) 50 MCG (2000 UT) capsule, Take 1 capsule (2,000 Units total) by mouth daily., Disp: 100 capsule, Rfl: 3   Dextromethorphan-guaiFENesin (MUCINEX DM MAXIMUM STRENGTH) 60-1200 MG TB12, Take 1 tablet by mouth every 12 (twelve) hours as needed (with flutter valve)., Disp: , Rfl:    ELDERBERRY PO, Take 1,000 mg by mouth daily., Disp: , Rfl:    famotidine (PEPCID) 40 MG tablet, TAKE ONE TABLET BY MOUTH DAILY, Disp: 30 tablet, Rfl: 0   fluticasone (FLONASE) 50 MCG/ACT nasal spray, Place 2 sprays into both nostrils daily., Disp: , Rfl:    glucose blood (ACCU-CHEK AVIVA PLUS) test strip, USE TO CHECK FOR BLOOD SUGAR TWO TIMES A DAY, Disp: 100 strip, Rfl: 0   JARDIANCE 25 MG TABS tablet, TAKE 1 TABLET BY MOUTH EVERY MORNING WITH OR WITHOUT FOOD, Disp: 90 tablet, Rfl: 0   loratadine (CLARITIN) 10 MG tablet, Take 10 mg by mouth at bedtime., Disp: , Rfl:    losartan (COZAAR) 25 MG tablet, TAKE 1/2 TABLET BY MOUTH DAILY, Disp: 45 tablet, Rfl: 1   meloxicam (MOBIC) 15 MG tablet, , Disp: , Rfl:      mometasone-formoterol (DULERA) 100-5 MCG/ACT AERO, Inhale 1 puff into the lungs 2 (two) times daily. , Disp: , Rfl:    montelukast (SINGULAIR) 10 MG tablet, Take 1 tablet (10 mg total) by mouth at bedtime., Disp: 30 tablet, Rfl: 5   ondansetron (ZOFRAN) 4 MG tablet, , Disp: , Rfl:    OXYGEN, 2lpm with sleep and 3lpm with exertion, Disp: , Rfl:    pantoprazole (PROTONIX) 40 MG tablet, TAKE ONE TABLET BY MOUTH TWICE A DAY BEFORE MEALS, Disp: 120 tablet, Rfl: 1   predniSONE (DELTASONE) 5 MG tablet, TAKE ONE TABLET BY MOUTH DAILY, Disp: 90 tablet, Rfl: 2   repaglinide (PRANDIN) 0.5 MG tablet, TAKE TWO TABLETS BY MOUTH THREE TIMES A DAY BEFORE A MEAL, Disp: 180 tablet, Rfl: 0   Respiratory Therapy Supplies  (FLUTTER) DEVI, Use as directed, Disp: 1 each, Rfl: 0   sucralfate (CARAFATE) 1 g tablet, TAKE ONE TABLET BY MOUTH FOUR TIMES A DAY WITH MEALS AND AT BEDTIME, Disp: 40 tablet, Rfl: 0   tirzepatide (MOUNJARO) 2.5 MG/0.5ML Pen, Inject 2.5 mg into the skin once a week., Disp: 2 mL, Rfl: 0   TRULICITY 4.5 JS/9.7WY SOPN, INJECT 4.5 MG UNDER THE SKIN ONCE WEEKLY, Disp: 2 mL, Rfl: 1   Turmeric 500 MG CAPS, Take 500 mg by mouth daily., Disp: , Rfl:    venlafaxine XR (EFFEXOR-XR) 37.5 MG 24 hr capsule, TAKE ONE CAPSULE BY MOUTH DAILY WITH BREAKFAST, Disp: 90 capsule, Rfl: 2   VITRON-C 65-125 MG TABS, Take 1 tablet by mouth daily., Disp: , Rfl:    Objective:     There were no vitals filed for this visit.    There is no height or weight on file to calculate BMI.    Physical Exam:    ***   Electronically signed by:  Benito Mccreedy D.[facility] 9:59 AM 06/14/22

## 2022-06-19 ENCOUNTER — Ambulatory Visit: Payer: Medicare Other | Admitting: Sports Medicine

## 2022-06-25 NOTE — Telephone Encounter (Signed)
Medication approval came back today.  Good from 06/22/22 - 06/22/2025.  Patient informed.

## 2022-06-25 NOTE — Telephone Encounter (Signed)
Patient approved for [facility]

## 2022-06-26 ENCOUNTER — Encounter: Payer: Self-pay | Admitting: Internal Medicine

## 2022-06-26 ENCOUNTER — Other Ambulatory Visit: Payer: Self-pay

## 2022-06-26 DIAGNOSIS — R091 Pleurisy: Secondary | ICD-10-CM | POA: Diagnosis not present

## 2022-06-26 DIAGNOSIS — M722 Plantar fascial fibromatosis: Secondary | ICD-10-CM | POA: Diagnosis not present

## 2022-06-26 MED ORDER — PANTOPRAZOLE SODIUM 40 MG PO TBEC: DELAYED_RELEASE_TABLET | ORAL | 2 refills | Status: DC

## 2022-07-18 ENCOUNTER — Telehealth: Payer: Self-pay

## 2022-07-18 ENCOUNTER — Ambulatory Visit (INDEPENDENT_AMBULATORY_CARE_PROVIDER_SITE_OTHER): Payer: Medicare Other

## 2022-07-18 ENCOUNTER — Other Ambulatory Visit: Payer: Self-pay

## 2022-07-18 DIAGNOSIS — Z0000 Encounter for general adult medical examination without abnormal findings: Secondary | ICD-10-CM | POA: Diagnosis not present

## 2022-07-18 DIAGNOSIS — Z1239 Encounter for other screening for malignant neoplasm of breast: Secondary | ICD-10-CM | POA: Diagnosis not present

## 2022-07-18 DIAGNOSIS — E1165 Type 2 diabetes mellitus with hyperglycemia: Secondary | ICD-10-CM

## 2022-07-18 MED ORDER — ACCU-CHEK AVIVA PLUS W/DEVICE KIT: PACK | 0 refills | Status: DC

## 2022-07-18 NOTE — Telephone Encounter (Signed)
Sent in today 

## 2022-07-18 NOTE — Patient Instructions (Signed)
[name] , Thank you for taking time to come for your Medicare Wellness Visit. I appreciate your ongoing commitment to your health goals. Please review the following plan we discussed and let me know if I can assist you in the future.   These are the goals we discussed:  Goals      CCM:  Monitor and Maintain HbA1c <8%     Continue to stay physically active and lose weight to help with glucose levels.        This is a list of the screening recommended for you and due dates:  Health Maintenance  Topic Date Due   COVID-19 Vaccine (4 - Moderna series) 08/15/2020   Eye exam for diabetics  10/05/2021   Complete foot exam   01/10/2022   Mammogram  03/29/2022   Zoster (Shingles) Vaccine (1 of 2) 09/06/2022*   Flu Shot  12/30/2022*   Tetanus Vaccine  06/08/2023*   Hemoglobin A1C  12/06/2022   Yearly kidney function blood test for diabetes  06/08/2023   Yearly kidney health urinalysis for diabetes  06/08/2023   Colon Cancer Screening  04/18/2031   Hepatitis C Screening: USPSTF Recommendation to screen - Ages 18-79 yo.  Completed   HIV Screening  Completed   HPV Vaccine  Aged Out  *Topic was postponed. The date shown is not the original due date.    Advanced directives: No  Conditions/risks identified: Yes; Type II Diabetes Mellitus (get A1C below 8%)  Next appointment: Follow up in one year for your annual wellness visit.   Preventive Care 40-64 Years, Female Preventive care refers to lifestyle choices and visits with your health care provider that can promote health and wellness. What does preventive care include? A yearly physical exam. This is also called an annual well check. Dental exams once or twice a year. Routine eye exams. Ask your health care provider how often you should have your eyes checked. Personal lifestyle choices, including: Daily care of your teeth and gums. Regular physical activity. Eating a healthy diet. Avoiding tobacco and drug use. Limiting alcohol  use. Practicing safe sex. Taking low-dose aspirin daily starting at age 50. Taking vitamin and mineral supplements as recommended by your health care provider. What happens during an annual well check? The services and screenings done by your health care provider during your annual well check will depend on your age, overall health, lifestyle risk factors, and family history of disease. Counseling  Your health care provider may ask you questions about your: Alcohol use. Tobacco use. Drug use. Emotional well-being. Home and relationship well-being. Sexual activity. Eating habits. Work and work environment. Method of birth control. Menstrual cycle. Pregnancy history. Screening  You may have the following tests or measurements: Height, weight, and BMI. Blood pressure. Lipid and cholesterol levels. These may be checked every 5 years, or more frequently if you are over 50 years old. Skin check. Lung cancer screening. You may have this screening every year starting at age 55 if you have a 30-pack-year history of smoking and currently smoke or have quit within the past 15 years. Fecal occult blood test (FOBT) of the stool. You may have this test every year starting at age 50. Flexible sigmoidoscopy or colonoscopy. You may have a sigmoidoscopy every 5 years or a colonoscopy every 10 years starting at age 50. Hepatitis C blood test. Hepatitis B blood test. Sexually transmitted disease (STD) testing. Diabetes screening. This is done by checking your blood sugar (glucose) after you have not   eaten for a while (fasting). You may have this done every 1-3 years. Mammogram. This may be done every 1-2 years. Talk to your health care provider about when you should start having regular mammograms. This may depend on whether you have a family history of breast cancer. BRCA-related cancer screening. This may be done if you have a family history of breast, ovarian, tubal, or peritoneal cancers. Pelvic  exam and Pap test. This may be done every 3 years starting at age 21. Starting at age 30, this may be done every 5 years if you have a Pap test in combination with an HPV test. Bone density scan. This is done to screen for osteoporosis. You may have this scan if you are at high risk for osteoporosis. Discuss your test results, treatment options, and if necessary, the need for more tests with your health care provider. Vaccines  Your health care provider may recommend certain vaccines, such as: Influenza vaccine. This is recommended every year. Tetanus, diphtheria, and acellular pertussis (Tdap, Td) vaccine. You may need a Td booster every 10 years. Zoster vaccine. You may need this after age 60. Pneumococcal 13-valent conjugate (PCV13) vaccine. You may need this if you have certain conditions and were not previously vaccinated. Pneumococcal polysaccharide (PPSV23) vaccine. You may need one or two doses if you smoke cigarettes or if you have certain conditions. Talk to your health care provider about which screenings and vaccines you need and how often you need them. This information is not intended to replace advice given to you by your health care provider. Make sure you discuss any questions you have with your health care provider. Document Released: 10/14/2015 Document Revised: 06/06/2016 Document Reviewed: 07/19/2015 Elsevier Interactive Patient Education  2017 [employer]    Fall Prevention in the Home Falls can cause injuries. They can happen to people of all ages. There are many things you can do to make your home safe and to help prevent falls. What can I do on the outside of my home? Regularly fix the edges of walkways and driveways and fix any cracks. Remove anything that might make you trip as you walk through a door, such as a raised step or threshold. Trim any bushes or trees on the path to your home. Use bright outdoor lighting. Clear any walking paths of anything that might  make someone trip, such as rocks or tools. Regularly check to see if handrails are loose or broken. Make sure that both sides of any steps have handrails. Any raised decks and porches should have guardrails on the edges. Have any leaves, snow, or ice cleared regularly. Use sand or salt on walking paths during winter. Clean up any spills in your garage right away. This includes oil or grease spills. What can I do in the bathroom? Use night lights. Install grab bars by the toilet and in the tub and shower. Do not use towel bars as grab bars. Use non-skid mats or decals in the tub or shower. If you need to sit down in the shower, use a plastic, non-slip stool. Keep the floor dry. Clean up any water that spills on the floor as soon as it happens. Remove soap buildup in the tub or shower regularly. Attach bath mats securely with double-sided non-slip rug tape. Do not have throw rugs and other things on the floor that can make you trip. What can I do in the bedroom? Use night lights. Make sure that you have a light by your bed   that is easy to reach. Do not use any sheets or blankets that are too big for your bed. They should not hang down onto the floor. Have a firm chair that has side arms. You can use this for support while you get dressed. Do not have throw rugs and other things on the floor that can make you trip. What can I do in the kitchen? Clean up any spills right away. Avoid walking on wet floors. Keep items that you use a lot in easy-to-reach places. If you need to reach something above you, use a strong step stool that has a grab bar. Keep electrical cords out of the way. Do not use floor polish or wax that makes floors slippery. If you must use wax, use non-skid floor wax. Do not have throw rugs and other things on the floor that can make you trip. What can I do with my stairs? Do not leave any items on the stairs. Make sure that there are handrails on both sides of the stairs  and use them. Fix handrails that are broken or loose. Make sure that handrails are as long as the stairways. Check any carpeting to make sure that it is firmly attached to the stairs. Fix any carpet that is loose or worn. Avoid having throw rugs at the top or bottom of the stairs. If you do have throw rugs, attach them to the floor with carpet tape. Make sure that you have a light switch at the top of the stairs and the bottom of the stairs. If you do not have them, ask someone to add them for you. What else can I do to help prevent falls? Wear shoes that: Do not have high heels. Have rubber bottoms. Are comfortable and fit you well. Are closed at the toe. Do not wear sandals. If you use a stepladder: Make sure that it is fully opened. Do not climb a closed stepladder. Make sure that both sides of the stepladder are locked into place. Ask someone to hold it for you, if possible. Clearly mark and make sure that you can see: Any grab bars or handrails. First and last steps. Where the edge of each step is. Use tools that help you move around (mobility aids) if they are needed. These include: Canes. Walkers. Scooters. Crutches. Turn on the lights when you go into a dark area. Replace any light bulbs as soon as they burn out. Set up your furniture so you have a clear path. Avoid moving your furniture around. If any of your floors are uneven, fix them. If there are any pets around you, be aware of where they are. Review your medicines with your doctor. Some medicines can make you feel dizzy. This can increase your chance of falling. Ask your doctor what other things that you can do to help prevent falls. This information is not intended to replace advice given to you by your health care provider. Make sure you discuss any questions you have with your health care provider. Document Released: 07/14/2009 Document Revised: 02/23/2016 Document Reviewed: 10/22/2014 Elsevier Interactive Patient  Education  2017 [employer]  

## 2022-07-18 NOTE — Telephone Encounter (Signed)
Patient is requesting a new rx for Accu-Chek Aviva Plus to be sent to [facility] at [facility]. Her current meter is not working at all.

## 2022-07-18 NOTE — Progress Notes (Signed)
Virtual Visit via Telephone Note  I connected with  [name] on 07/18/22 at 10:00 AM EDT by telephone and verified that I am speaking with the correct person using two identifiers.  Location: Patient: Home Provider: [place] Persons participating in the virtual visit: [name]   I discussed the limitations, risks, security and privacy concerns of performing an evaluation and management service by telephone and the availability of in person appointments. The patient expressed understanding and agreed to proceed.  Interactive audio and video telecommunications were attempted between this nurse and patient, however failed, due to patient having technical difficulties OR patient did not have access to video capability.  We continued and completed visit with audio only.  Some vital signs may be absent or patient reported.   Sheral Flow, LPN  Subjective:   [name] is a 51 y.o. female who presents for Medicare Annual (Subsequent) preventive examination.  Review of Systems     Cardiac Risk Factors include: diabetes mellitus;dyslipidemia;hypertension;family history of premature cardiovascular disease     Objective:    There were no vitals filed for this visit. There is no height or weight on file to calculate BMI.     07/18/2022   10:06 AM 07/17/2021    1:10 PM 04/17/2021    8:36 AM 04/11/2020   11:19 AM 12/29/2019    8:16 AM 03/25/2019    8:29 AM 11/08/2017   10:03 AM  Advanced Directives  Does Patient Have a Medical Advance Directive? No No No Yes No No No  Type of Scientist, research (medical);Living will     Does patient want to make changes to medical advance directive?    No - Patient declined     Copy of [place] in Chart?    No - copy requested     Would patient like information on creating a medical advance directive? No - Patient declined No - Patient declined No - Patient declined  No -  Patient declined No - Patient declined Yes (ED - Information included in AVS)    Current Medications (verified) Outpatient Encounter Medications as of 07/18/2022  Medication Sig   Accu-Chek Softclix Lancets lancets Use to check blood sugars twice a day   albuterol (PROVENTIL HFA;VENTOLIN HFA) 108 (90 Base) MCG/ACT inhaler Inhale 2 puffs into the lungs every 4 (four) hours as needed for wheezing or shortness of breath.   APPLE CIDER VINEGAR PO Take 450 mg by mouth daily.   aspirin EC 81 MG tablet Take 81 mg by mouth daily. Swallow whole.   atorvastatin (LIPITOR) 20 MG tablet TAKE ONE TABLET BY MOUTH DAILY   B COMPLEX VITAMINS SL Place 1 mL under the tongue 4 (four) times a week.   Blood Glucose Monitoring Suppl (ACCU-CHEK AVIVA PLUS) w/Device KIT Use to check blood sugars twice a day   calcium-vitamin D (OSCAL WITH D) 500-200 MG-UNIT tablet Take 1 tablet by mouth daily.   celecoxib (CELEBREX) 200 MG capsule TAKE ONE CAPSULE BY MOUTH TWICE A DAY   Cholecalciferol (VITAMIN D3) 50 MCG (2000 UT) capsule Take 1 capsule (2,000 Units total) by mouth daily.   Dextromethorphan-guaiFENesin (MUCINEX DM MAXIMUM STRENGTH) 60-1200 MG TB12 Take 1 tablet by mouth every 12 (twelve) hours as needed (with flutter valve).   ELDERBERRY PO Take 1,000 mg by mouth daily.   famotidine (PEPCID) 40 MG tablet TAKE ONE TABLET BY MOUTH DAILY   fluticasone (FLONASE) 50 MCG/ACT  nasal spray Place 2 sprays into both nostrils daily.   glucose blood (ACCU-CHEK AVIVA PLUS) test strip USE TO CHECK FOR BLOOD SUGAR TWO TIMES A DAY   JARDIANCE 25 MG TABS tablet TAKE 1 TABLET BY MOUTH EVERY MORNING WITH OR WITHOUT FOOD   loratadine (CLARITIN) 10 MG tablet Take 10 mg by mouth at bedtime.   losartan (COZAAR) 25 MG tablet TAKE 1/2 TABLET BY MOUTH DAILY   meloxicam (MOBIC) 15 MG tablet    mometasone-formoterol (DULERA) 100-5 MCG/ACT AERO Inhale 1 puff into the lungs 2 (two) times daily.    montelukast (SINGULAIR) 10 MG tablet Take 1  tablet (10 mg total) by mouth at bedtime.   ondansetron (ZOFRAN) 4 MG tablet    OXYGEN 2lpm with sleep and 3lpm with exertion   pantoprazole (PROTONIX) 40 MG tablet TAKE ONE TABLET BY MOUTH TWICE A DAY BEFORE MEALS   predniSONE (DELTASONE) 5 MG tablet TAKE ONE TABLET BY MOUTH DAILY   repaglinide (PRANDIN) 0.5 MG tablet TAKE TWO TABLETS BY MOUTH THREE TIMES A DAY BEFORE A MEAL   Respiratory Therapy Supplies (FLUTTER) DEVI Use as directed   sucralfate (CARAFATE) 1 g tablet TAKE ONE TABLET BY MOUTH FOUR TIMES A DAY WITH MEALS AND AT BEDTIME   tirzepatide (MOUNJARO) 2.5 MG/0.5ML Pen Inject 2.5 mg into the skin once a week.   TRULICITY 4.5 IP/3.8SN SOPN INJECT 4.5 MG UNDER THE SKIN ONCE WEEKLY   Turmeric 500 MG CAPS Take 500 mg by mouth daily.   venlafaxine XR (EFFEXOR-XR) 37.5 MG 24 hr capsule TAKE ONE CAPSULE BY MOUTH DAILY WITH BREAKFAST   VITRON-C 65-125 MG TABS Take 1 tablet by mouth daily.   No facility-administered encounter medications on file as of 07/18/2022.    Allergies (verified) Penicillins, Metformin and related, Peach flavor, and Levaquin [levofloxacin]   History: Past Medical History:  Diagnosis Date   Allergic rhinitis    Arthritis    Knees ankles  feet   Bronchitis    Chronic rhinitis    Cough    Dyslipidemia    Dysphagia    Dyspnea    occationally over last 2 years   Fibromyalgia    GERD (gastroesophageal reflux disease)    Headache(784.0)    Hyperlipidemia    ILD (interstitial lung disease) ([place])    Mild depression    Morbid obesity ([place])    Type II or diabetes mellitus, uncontrolled 11/2012 dx   Dx 12/08/12: a1c 10.0   Unspecified essential hypertension    Urge incontinence    Past Surgical History:  Procedure Laterality Date   ABDOMINAL HYSTERECTOMY     BILATERAL VATS ABLATION  02/15/04   BIOPSY  04/17/2021   Procedure: BIOPSY;  Surgeon: Thornton Park, MD;  Location: [facility];  Service: Gastroenterology;;   CESAREAN SECTION     COLONOSCOPY  WITH PROPOFOL N/A 04/17/2021   Procedure: COLONOSCOPY WITH PROPOFOL;  Surgeon: Thornton Park, MD;  Location: [facility];  Service: Gastroenterology;  Laterality: N/A;   KNEE ARTHROSCOPY WITH MEDIAL MENISECTOMY Right 04/01/2019   Procedure: KNEE ARTHROSCOPY WITH MEDIAL MENISECTOMY;  Surgeon: Hiram Gash, MD;  Location: [facility];  Service: Orthopedics;  Laterality: Right;   KNEE ARTHROSCOPY WITH MEDIAL MENISECTOMY Left 01/06/2020   Procedure: KNEE ARTHROSCOPY WITH MEDIAL MENISECTOMY;  Surgeon: Hiram Gash, MD;  Location: [facility];  Service: Orthopedics;  Laterality: Left;   LUNG BIOPSY     RIGHT HEART CATH N/A 01/30/2017   Procedure: Right Heart Cath;  Surgeon: Elby Showers  Aundra Dubin, MD;  Location: [facility];  Service: Cardiovascular;  Laterality: N/A;   Family History  Problem Relation Age of Onset   Sarcoidosis Mother        dxed by transbrochial bx   Allergies Mother    Heart disease Father    Diabetes Father    Allergies Father    Coronary artery disease Father    Cancer Maternal Grandmother        CA of unknown type   Cancer Paternal Grandmother        CA of unknown type   Colon cancer Neg Hx    Esophageal cancer Neg Hx    Rectal cancer Neg Hx    Stomach cancer Neg Hx    Social History   Socioeconomic History   Marital status: Married    Spouse name: Not on file   Number of children: 1   Years of education: Not on file   Highest education level: Not on file  Occupational History   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    Comment: Works in [employer] and on her feet all day  Tobacco Use   Smoking status: Never   Smokeless tobacco: Never   Tobacco comments:    {  Vaping Use   Vaping Use: Never used  Substance and Sexual Activity   Alcohol use: No    Comment: maybe 2 times per year   Drug use: No   Sexual activity: Never  Other Topics Concern   Not on file  Social History Narrative   Not on file   Social Determinants of Health   Financial Resource Strain:  Low Risk  (07/18/2022)   Overall Financial Resource Strain (CARDIA)    Difficulty of Paying Living Expenses: Not hard at all  Food Insecurity: No Food Insecurity (07/18/2022)   Hunger Vital Sign    Worried About Running Out of Food in the Last Year: Never true    Ran Out of Food in the Last Year: Never true  Transportation Needs: No Transportation Needs (07/18/2022)   PRAPARE - Hydrologist (Medical): No    Lack of Transportation (Non-Medical): No  Physical Activity: Sufficiently Active (07/18/2022)   Exercise Vital Sign    Days of Exercise per Week: 5 days    Minutes of Exercise per Session: 30 min  Stress: No Stress Concern Present (07/18/2022)   [place]    Feeling of Stress : Not at all  Social Connections: [place] (07/18/2022)   Social Connection and Isolation Panel [NHANES]    Frequency of Communication with Friends and Family: More than three times a week    Frequency of Social Gatherings with Friends and Family: More than three times a week    Attends Religious Services: More than 4 times per year    Active Member of [employer] or Organizations: Yes    Attends Music therapist: More than 4 times per year    Marital Status: Married    Tobacco Counseling Counseling given: Not Answered Tobacco comments: {   Clinical Intake:  Pre-visit preparation completed: Yes  Pain : No/denies pain     BMI - recorded: 48.82 (06/07/2022) Nutritional Risks: None Diabetes: No  How often do you need to have someone help you when you read instructions, pamphlets, or other written materials from your doctor or pharmacy?: 1 - Never What is the last grade level you completed in school?: HS[employer];  Associate's Degree in Speak & Communication  Nutrition Risk Assessment:  Has the patient had any N/V/D within the last 2 months?  No  Does the patient have any non-healing wounds?  No   Has the patient had any unintentional weight loss or weight gain?  No   Diabetes:  Is the patient diabetic?  Yes  If diabetic, was a CBG obtained today?  No  Did the patient bring in their glucometer from home?  No  How often do you monitor your CBG's? Regularly per patient.   Financial Strains and Diabetes Management:  Are you having any financial strains with the device, your supplies or your medication? No .  Does the patient want to be seen by Chronic Care Management for management of their diabetes?  No  Would the patient like to be referred to a Nutritionist or for Diabetic Management?  No   Diabetic Exams:  Diabetic Eye Exam: Overdue for diabetic eye exam. Pt has been advised about the importance in completing this exam. Patient advised to call and schedule an eye exam. Diabetic Foot Exam: Overdue, Pt has been advised about the importance in completing this exam. Pt is scheduled for diabetic foot exam on 10/09/2022.   Interpreter Needed?: No  Information entered by :: Lisette Abu, LPN.   Activities of Daily Living    07/18/2022   10:19 AM  In your present state of health, do you have any difficulty performing the following activities:  Hearing? 0  Vision? 0  Difficulty concentrating or making decisions? 1  Comment brain fog, loss of words since having covid twice  Walking or climbing stairs? 0  Dressing or bathing? 0  Doing errands, shopping? 0  Preparing Food and eating ? N  Using the Toilet? N  In the past six months, have you accidently leaked urine? N  Do you have problems with loss of bowel control? N  Managing your Medications? N  Managing your Finances? N  Housekeeping or managing your Housekeeping? N    Patient Care Team: Binnie Rail, MD as PCP - General (Internal Medicine) Tanda Rockers, MD (Pulmonary Disease) [place] Comment, [facility] [place], [place] as Referring Physician (Optometry)  Indicate any recent Medical Services you may have received from other than [facility]  providers in the past year (date may be approximate).     Assessment:   This is a routine wellness examination for [name].  Hearing/Vision screen Hearing Screening - Comments:: Denies hearing difficulties   Vision Screening - Comments:: Wears rx glasses - up to date with routine eye exams with [facility], [facility].   Dietary issues and exercise activities discussed: Current Exercise Habits: Home exercise routine, Type of exercise: walking, Time (Minutes): 30, Frequency (Times/Week): 5, Weekly Exercise (Minutes/Week): 150, Intensity: Moderate, Exercise limited by: respiratory conditions(s)   Goals Addressed             This Visit's Progress    CCM:  Monitor and Maintain HbA1c <8%       Continue to stay physically active and lose weight to help with glucose levels.      Depression Screen    06/07/2022   10:52 AM 07/17/2021    1:19 PM 04/11/2020   11:16 AM 11/08/2017   10:06 AM  PHQ 2/9 Scores  PHQ - 2 Score 0 0 0 1  PHQ- 9 Score    4    Fall Risk    07/18/2022   10:07 AM 06/07/2022   10:52 AM 07/17/2021  1:12 PM 04/11/2020   11:19 AM 11/08/2017   10:06 AM  Fall Risk   Falls in the past year? 0 0 0 0 Yes  Number falls in past yr: 0 0 0 0 1  Injury with Fall? 0 0 0 0 No  Risk for fall due to : No Fall Risks No Fall Risks No Fall Risks No Fall Risks   Follow up Falls prevention discussed Falls evaluation completed Falls evaluation completed Falls evaluation completed Falls prevention discussed    FALL RISK PREVENTION PERTAINING TO THE HOME:  Any stairs in or around the home? Yes  If so, are there any without handrails? No  Home free of loose throw rugs in walkways, pet beds, electrical cords, etc? Yes  Adequate lighting in your home to reduce risk of falls? Yes   ASSISTIVE DEVICES UTILIZED TO PREVENT FALLS:  Life alert? No  Use of a cane, walker or w/c? No  Grab bars in the bathroom? Yes  Shower chair or bench in shower? Yes  Elevated toilet seat or a handicapped  toilet? Yes   TIMED UP AND GO:  Was the test performed? No . Phone Visit  Cognitive Function:        07/18/2022   10:20 AM  6CIT Screen  What Year? 0 points  What month? 0 points  What time? 0 points  Count back from 20 0 points  Months in reverse 0 points  Repeat phrase 0 points  Total Score 0 points    Immunizations Immunization History  Administered Date(s) Administered   Influenza Split 06/21/2011, 07/29/2012   Influenza Whole 07/22/2009   Influenza,inj,Quad PF,6+ Mos 06/15/2013, 08/19/2014, 09/19/2015, 08/31/2016, 08/09/2017, 09/03/2018, 06/09/2019, 08/17/2020   Moderna Sars-Covid-2 Vaccination 10/24/2019, 11/28/2019, 06/20/2020   Pneumococcal Conjugate-13 09/19/2015   Pneumococcal Polysaccharide-23 07/22/2009   Td 03/24/2009    TDAP status: Due, Education has been provided regarding the importance of this vaccine. Advised may receive this vaccine at local pharmacy or Health Dept. Aware to provide a copy of the vaccination record if obtained from local pharmacy or Health Dept. Verbalized acceptance and understanding.  Flu Vaccine status: Due, Education has been provided regarding the importance of this vaccine. Advised may receive this vaccine at local pharmacy or Health Dept. Aware to provide a copy of the vaccination record if obtained from local pharmacy or Health Dept. Verbalized acceptance and understanding.  Pneumococcal vaccine status: Up to date  Covid-19 vaccine status: Completed vaccines  Qualifies for Shingles Vaccine? Yes   Zostavax completed No   Shingrix Completed?: No.    Education has been provided regarding the importance of this vaccine. Patient has been advised to call insurance company to determine out of pocket expense if they have not yet received this vaccine. Advised may also receive vaccine at local pharmacy or Health Dept. Verbalized acceptance and understanding.  Screening Tests Health Maintenance  Topic Date Due   COVID-19 Vaccine (4 -  Moderna series) 08/15/2020   OPHTHALMOLOGY EXAM  10/05/2021   FOOT EXAM  01/10/2022   MAMMOGRAM  03/29/2022   Zoster Vaccines- Shingrix (1 of 2) 09/06/2022 (Originally 03/29/2022)   INFLUENZA VACCINE  12/30/2022 (Originally 05/01/2022)   TETANUS/TDAP  06/08/2023 (Originally 03/25/2019)   HEMOGLOBIN A1C  12/06/2022   Diabetic kidney evaluation - GFR measurement  06/08/2023   Diabetic kidney evaluation - Urine ACR  06/08/2023   COLONOSCOPY (Pts 45-61yr Insurance coverage will need to be confirmed)  04/18/2031   Hepatitis C Screening  Completed   HIV Screening  Completed   HPV VACCINES  Aged Out    Health Maintenance  Health Maintenance Due  Topic Date Due   COVID-19 Vaccine (4 - Moderna series) 08/15/2020   OPHTHALMOLOGY EXAM  10/05/2021   FOOT EXAM  01/10/2022   MAMMOGRAM  03/29/2022    Colorectal cancer screening: Type of screening: Colonoscopy. Completed 04/17/2021. Repeat every 10 years  Mammogram status: Ordered 07/18/2022. Pt provided with contact info and advised to call to schedule appt.   Bone Density status: never done  Lung Cancer Screening: (Low Dose CT Chest recommended if Age 54-80 years, 30 pack-year currently smoking OR have quit w/in 15years.) does not qualify.   Lung Cancer Screening Referral: no  Additional Screening:  Hepatitis C Screening: does qualify; Completed 08/16/2020  Vision Screening: Recommended annual ophthalmology exams for early detection of glaucoma and other disorders of the eye. Is the patient up to date with their annual eye exam?  No  Who is the provider or what is the name of the office in which the patient attends annual eye exams? [facility], [place]. If pt is not established with a provider, would they like to be referred to a provider to establish care? No .   Dental Screening: Recommended annual dental exams for proper oral hygiene  Community Resource Referral / Chronic Care Management: CRR required this visit?  No   CCM required this  visit?  No      Plan:     I have personally reviewed and noted the following in the patient's chart:   Medical and social history Use of alcohol, tobacco or illicit drugs  Current medications and supplements including opioid prescriptions. Patient is not currently taking opioid prescriptions. Functional ability and status Nutritional status Physical activity Advanced directives List of other physicians Hospitalizations, surgeries, and ER visits in previous 12 months Vitals Screenings to include cognitive, depression, and falls Referrals and appointments  In addition, I have reviewed and discussed with patient certain preventive protocols, quality metrics, and best practice recommendations. A written personalized care plan for preventive services as well as general preventive health recommendations were provided to patient.     Sheral Flow, LPN   [id number]   Nurse Notes: N/A

## 2022-07-19 ENCOUNTER — Other Ambulatory Visit: Payer: Self-pay | Admitting: Internal Medicine

## 2022-07-26 DIAGNOSIS — M722 Plantar fascial fibromatosis: Secondary | ICD-10-CM | POA: Diagnosis not present

## 2022-07-26 DIAGNOSIS — R091 Pleurisy: Secondary | ICD-10-CM | POA: Diagnosis not present

## 2022-08-10 ENCOUNTER — Other Ambulatory Visit: Payer: Self-pay | Admitting: Internal Medicine

## 2022-08-15 ENCOUNTER — Other Ambulatory Visit: Payer: Self-pay | Admitting: Internal Medicine

## 2022-08-19 ENCOUNTER — Ambulatory Visit: Payer: Medicare Other

## 2022-08-19 DIAGNOSIS — Z23 Encounter for immunization: Secondary | ICD-10-CM

## 2022-08-24 ENCOUNTER — Other Ambulatory Visit: Payer: Self-pay | Admitting: Internal Medicine

## 2022-08-26 DIAGNOSIS — M722 Plantar fascial fibromatosis: Secondary | ICD-10-CM | POA: Diagnosis not present

## 2022-08-26 DIAGNOSIS — R091 Pleurisy: Secondary | ICD-10-CM | POA: Diagnosis not present

## 2022-09-20 ENCOUNTER — Other Ambulatory Visit: Payer: Self-pay | Admitting: Internal Medicine

## 2022-09-25 DIAGNOSIS — M722 Plantar fascial fibromatosis: Secondary | ICD-10-CM | POA: Diagnosis not present

## 2022-09-25 DIAGNOSIS — R091 Pleurisy: Secondary | ICD-10-CM | POA: Diagnosis not present

## 2022-10-09 ENCOUNTER — Ambulatory Visit: Payer: Medicare Other | Admitting: Internal Medicine

## 2022-10-16 ENCOUNTER — Other Ambulatory Visit: Payer: Self-pay

## 2022-10-16 ENCOUNTER — Other Ambulatory Visit: Payer: Self-pay | Admitting: Internal Medicine

## 2022-10-26 DIAGNOSIS — R091 Pleurisy: Secondary | ICD-10-CM | POA: Diagnosis not present

## 2022-10-26 DIAGNOSIS — M722 Plantar fascial fibromatosis: Secondary | ICD-10-CM | POA: Diagnosis not present

## 2022-11-07 ENCOUNTER — Ambulatory Visit (INDEPENDENT_AMBULATORY_CARE_PROVIDER_SITE_OTHER): Payer: Medicare Other

## 2022-11-07 ENCOUNTER — Ambulatory Visit (INDEPENDENT_AMBULATORY_CARE_PROVIDER_SITE_OTHER): Payer: Medicare Other | Admitting: Sports Medicine

## 2022-11-07 VITALS — HR 65 | Ht 60.0 in | Wt 248.0 lb

## 2022-11-07 DIAGNOSIS — M76821 Posterior tibial tendinitis, right leg: Secondary | ICD-10-CM | POA: Diagnosis not present

## 2022-11-07 DIAGNOSIS — G8929 Other chronic pain: Secondary | ICD-10-CM

## 2022-11-07 DIAGNOSIS — M2141 Flat foot [pes planus] (acquired), right foot: Secondary | ICD-10-CM | POA: Diagnosis not present

## 2022-11-07 DIAGNOSIS — M19071 Primary osteoarthritis, right ankle and foot: Secondary | ICD-10-CM | POA: Diagnosis not present

## 2022-11-07 DIAGNOSIS — M2142 Flat foot [pes planus] (acquired), left foot: Secondary | ICD-10-CM | POA: Diagnosis not present

## 2022-11-07 DIAGNOSIS — M25571 Pain in right ankle and joints of right foot: Secondary | ICD-10-CM | POA: Diagnosis not present

## 2022-11-07 DIAGNOSIS — M7731 Calcaneal spur, right foot: Secondary | ICD-10-CM | POA: Diagnosis not present

## 2022-11-07 DIAGNOSIS — M79671 Pain in right foot: Secondary | ICD-10-CM | POA: Diagnosis not present

## 2022-11-07 MED ORDER — MELOXICAM 15 MG PO TABS: 15.0000 mg | ORAL_TABLET | Freq: Every day | ORAL | 0 refills | Status: DC

## 2022-11-07 NOTE — Patient Instructions (Signed)
-   Start meloxicam 15 mg daily x2 weeks.  If still having pain after 2 weeks, complete 3rd-week of meloxicam. May use remaining meloxicam as needed once daily for pain control.  Do not to use additional NSAIDs while taking meloxicam.  May use Tylenol [phone] mg 2 to 3 times a day for breakthrough pain. Ankle HEP  Recommend [employer] or similar store to find flat feet inserts  3 week follow up

## 2022-11-07 NOTE — Progress Notes (Signed)
Benito Mccreedy D.[place] Phone: [phone]   Assessment and Plan:     1. Chronic pain of right ankle 2. Right foot pain 3. Pes planus of both feet 4. 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  If still having pain after 2 weeks, complete 3rd-week of meloxicam. May use remaining meloxicam as needed once daily for pain control.  Do not to use additional NSAIDs while taking meloxicam.  May use Tylenol [phone] mg 2 to 3 times a day for breakthrough pain. - Recommend getting arch support inserts for shoes.  Can try [employer] or other similar store - X-rays obtained in clinic.  My interpretation: No acute fracture or dislocation.  Multiple areas of cortical irregularity including medial malleolus, navicular, calcaneal heel spur.  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  Pes planus.  Other orders - meloxicam (MOBIC) 15 MG tablet; Take 1 tablet (15 mg total) by mouth daily.    Pertinent previous records reviewed include none   Follow Up: 3 weeks for reevaluation.  If no improvement or worsening of symptoms, would repeat posterior tibialis CSI which has been beneficial for patient in the past   Subjective:   I, [place], am serving as a Education administrator for Doctor Glennon Mac   Chief Complaint: right ankle pain   HPI:    05/28/22 Patient is a 52 year old female complaining of right ankle pain. Patient states that she has arthritis and knows shes obese has osteopenia , pain has continued and she is medicating to much , medial ankle pain , fees like a hot nail being inserted , pain  is happening in her sleep now,has used prednisone chronic , states she needs cortisone , has used heat, ice and tylenol, Friday used '15mg'$  , 10 mg,5 mgof prednisone that was the only way she could get through the weekend, thinks she needs CSI    11/07/2022 Patient states that her ankle and heel are both in pain feels like there is a ball of nerves in the heel of her foot , the pain radiates to the medial ankle a sharp sticking pain , ankle is swollen, she has intermittent pain when she tries to stretch , she has relief when she is in her high heels , her ankle is angry, has tried heat ice and ointments has been in pain for 2 weeks    Relevant Historical Information: Hypertension, GERD, DM type II, interstitial lung disease    Additional pertinent review of systems negative.   Current Outpatient Medications:    Accu-Chek Softclix Lancets lancets, Use to check blood sugars twice a day, Disp: 100 each, Rfl: 12   albuterol (PROVENTIL HFA;VENTOLIN HFA) 108 (90 Base) MCG/ACT inhaler, Inhale 2 puffs into the lungs every 4 (four) hours as needed for wheezing or shortness of breath., Disp: 1 Inhaler, Rfl: 0   aspirin EC 81 MG tablet, Take 81 mg by mouth daily. Swallow whole., Disp: , Rfl:    atorvastatin (LIPITOR) 20 MG tablet, TAKE 1 TABLET BY MOUTH DAILY, Disp: 90 tablet, Rfl: 1   B COMPLEX VITAMINS SL, Place 1 mL under the tongue 4 (four) times  a week., Disp: , Rfl:    Blood Glucose Monitoring Suppl (ACCU-CHEK AVIVA PLUS) w/Device KIT, Use to check blood sugars twice a day, Disp: 1 kit, Rfl: 0   calcium-vitamin D (OSCAL WITH D) 500-200 MG-UNIT tablet, Take 1 tablet by mouth daily., Disp: , Rfl:    celecoxib (CELEBREX) 200 MG capsule, TAKE ONE CAPSULE BY MOUTH TWICE A DAY, Disp: 60 capsule, Rfl: 5   Cholecalciferol (VITAMIN D3) 50 MCG (2000 UT) capsule, Take 1 capsule (2,000 Units total) by mouth daily., Disp: 100 capsule, Rfl: 3   Dextromethorphan-guaiFENesin (MUCINEX DM MAXIMUM STRENGTH) 60-1200 MG  TB12, Take 1 tablet by mouth every 12 (twelve) hours as needed (with flutter valve)., Disp: , Rfl:    ELDERBERRY PO, Take 1,000 mg by mouth daily., Disp: , Rfl:    famotidine (PEPCID) 40 MG tablet, TAKE ONE TABLET BY MOUTH DAILY, Disp: 30 tablet, Rfl: 0   fluticasone (FLONASE) 50 MCG/ACT nasal spray, Place 2 sprays into both nostrils daily., Disp: , Rfl:    glucose blood (ACCU-CHEK AVIVA PLUS) test strip, USE TO CHECK FOR BLOOD SUGAR TWO TIMES A DAY, Disp: 100 strip, Rfl: 0   JARDIANCE 25 MG TABS tablet, TAKE ONE TABLET BY MOUTH EVERY MORNING WITH OR WITHOUT FOOD, Disp: 90 tablet, Rfl: 0   loratadine (CLARITIN) 10 MG tablet, Take 10 mg by mouth at bedtime., Disp: , Rfl:    losartan (COZAAR) 25 MG tablet, TAKE 1/2 TABLET BY MOUTH DAILY, Disp: 45 tablet, Rfl: 1   mometasone-formoterol (DULERA) 100-5 MCG/ACT AERO, Inhale 1 puff into the lungs 2 (two) times daily. , Disp: , Rfl:    montelukast (SINGULAIR) 10 MG tablet, Take 1 tablet (10 mg total) by mouth at bedtime., Disp: 30 tablet, Rfl: 5   MOUNJARO 2.5 MG/0.5ML Pen, INJECT 2.5 MG UNDER THE SKIN ONCE WEEKLY, Disp: 2 mL, Rfl: 0   OXYGEN, 2lpm with sleep and 3lpm with exertion, Disp: , Rfl:    pantoprazole (PROTONIX) 40 MG tablet, TAKE ONE TABLET BY MOUTH TWICE A DAY BEFORE MEALS, Disp: 120 tablet, Rfl: 2   predniSONE (DELTASONE) 5 MG tablet, TAKE ONE TABLET BY MOUTH DAILY, Disp: 90 tablet, Rfl: 2   repaglinide (PRANDIN) 0.5 MG tablet, TAKE TWO TABLETS BY MOUTH THREE TIMES A DAY BEFORE A MEAL, Disp: 180 tablet, Rfl: 0   Respiratory Therapy Supplies (FLUTTER) DEVI, Use as directed, Disp: 1 each, Rfl: 0   sucralfate (CARAFATE) 1 g tablet, TAKE ONE TABLET BY MOUTH FOUR TIMES A DAY WITH MEALS AND AT BEDTIME, Disp: 40 tablet, Rfl: 0   TRULICITY 4.5 OH/6.0VP SOPN, INJECT 4.5 MG UNDER THE SKIN ONCE WEEKLY, Disp: 2 mL, Rfl: 1   Turmeric 500 MG CAPS, Take 500 mg by mouth daily., Disp: , Rfl:    venlafaxine XR (EFFEXOR-XR) 37.5 MG 24 hr capsule, TAKE ONE  CAPSULE BY MOUTH DAILY WITH BREAKFAST, Disp: 90 capsule, Rfl: 2   VITRON-C 65-125 MG TABS, Take 1 tablet by mouth daily., Disp: , Rfl:    APPLE CIDER VINEGAR PO, Take 450 mg by mouth daily., Disp: , Rfl:    meloxicam (MOBIC) 15 MG tablet, , Disp: , Rfl:    ondansetron (ZOFRAN) 4 MG tablet, , Disp: , Rfl:    Objective:     Vitals:   11/07/22 1033  Pulse: 65  SpO2: 97%  Weight: 248 lb (112.5 kg)  Height: 5' (1.524 m)      Body mass index is 48.43 kg/m.    Physical Exam:  Gen: Appears well, nad, nontoxic and pleasant Psych: Alert and oriented, appropriate mood and affect Neuro: sensation intact, strength is 5/5 with df/pf/inv/ev, muscle tone wnl Skin: no susupicious lesions or rashes   Right ankle: no deformity, no swelling or effusion Pes planus bilaterally TTP significantly posterior to medial malleolus along posterior tibialis tendon NTTP over fibular head, lat mal, medial mal, achilles, navicular, base of 5th, ATFL, CFL, deltoid, calcaneous or midfoot ROM DF 30, PF 45, inv/ev intact Negative ant drawer, talar tilt, rotation test, squeeze test. Neg thompson Mild pain with resisted inversion or eversion     Electronically signed by:  Benito Mccreedy D.[facility] 11:05 AM 11/07/22

## 2022-11-08 ENCOUNTER — Other Ambulatory Visit: Payer: Self-pay | Admitting: Internal Medicine

## 2022-11-08 ENCOUNTER — Other Ambulatory Visit: Payer: Self-pay

## 2022-11-09 ENCOUNTER — Other Ambulatory Visit: Payer: Self-pay | Admitting: Internal Medicine

## 2022-11-09 ENCOUNTER — Encounter: Payer: Self-pay | Admitting: Sports Medicine

## 2022-11-13 ENCOUNTER — Ambulatory Visit: Payer: Medicare Other | Admitting: Internal Medicine

## 2022-11-16 ENCOUNTER — Ambulatory Visit
Admission: RE | Admit: 2022-11-16 | Discharge: 2022-11-16 | Disposition: A | Discharge status: Home / Self Care | Payer: Medicare Other | Source: Ambulatory Visit | Attending: Internal Medicine | Admitting: Internal Medicine

## 2022-11-16 VITALS — BP 130/87 | HR 87 | Temp 97.8°F | Resp 18

## 2022-11-16 DIAGNOSIS — S161XXA Strain of muscle, fascia and tendon at neck level, initial encounter: Secondary | ICD-10-CM | POA: Diagnosis not present

## 2022-11-16 MED ORDER — METHOCARBAMOL 500 MG PO TABS: 500.0000 mg | ORAL_TABLET | Freq: Two times a day (BID) | ORAL | 0 refills | Status: DC | PRN

## 2022-11-16 NOTE — ED Provider Notes (Addendum)
[name]    CSN: [id number] Arrival date & time: 11/16/22  0803      History   Chief Complaint Chief Complaint  Patient presents with   Neck Injury    Pain in my my neck and head. The pain is on the right side in the back. - Entered by patient   Neck Pain    HPI [name] is a 52 y.o. female.   Patient presents with neck pain that started 4 days ago.  Patient denies any obvious injury to the area or any history of chronic neck pain.  States that the neck pain started randomly.  Pain is located on the posterior right side.  Reports limited range of motion due to pain.  Patient has taken Tylenol, ibuprofen, meloxicam, used IcyHot, and ice and heat applications with no improvement.  Patient denies numbness or tingling. Denies fever.    Neck Injury  Neck Pain   Past Medical History:  Diagnosis Date   Allergic rhinitis    Arthritis    Knees ankles  feet   Bronchitis    Chronic rhinitis    Cough    Dyslipidemia    Dysphagia    Dyspnea    occationally over last 2 years   Fibromyalgia    GERD (gastroesophageal reflux disease)    Headache(784.0)    Hyperlipidemia    ILD (interstitial lung disease) (HCC)    Mild depression    Morbid obesity ([place])    Type II or diabetes mellitus, uncontrolled 11/2012 dx   Dx 12/08/12: a1c 10.0   Unspecified essential hypertension    Urge incontinence     Patient Active Problem List   Diagnosis Date Noted   Urinary frequency 06/28/2021   Change in stool 06/28/2021   Perianal irritation 06/28/2021   Adenomatous polyp of transverse colon    On home oxygen therapy 03/27/2021   Genital herpes 04/11/2020   History of nephrolithiasis 10/13/2019   Anxiety 02/05/2018   Peroneal tendinitis of lower leg, right 10/22/2017   Chronic respiratory failure with hypoxia ([place]) 07/20/2017   Knee pain, left 02/02/2016   Cough variant asthma 03/09/2015   Diabetes ([place]) 12/09/2012   Dyslipidemia    Acute on chronic respiratory  failure with hypoxia ([place]) 04/05/2012   Allergic rhinitis 01/05/2010   Essential hypertension 09/07/2009   URINARY INCONTINENCE, URGE 03/24/2009   CHRONIC RHINITIS 12/23/2008   Morbid obesity due to excess calories ([place]) 10/10/2007   INTERSTITIAL LUNG DISEASE 10/10/2007   GASTROESOPHAGEAL REFLUX DISEASE 10/10/2007    Past Surgical History:  Procedure Laterality Date   ABDOMINAL HYSTERECTOMY     BILATERAL VATS ABLATION  02/15/04   BIOPSY  04/17/2021   Procedure: BIOPSY;  Surgeon: Thornton Park, MD;  Location: [facility];  Service: Gastroenterology;;   CESAREAN SECTION     COLONOSCOPY WITH PROPOFOL N/A 04/17/2021   Procedure: COLONOSCOPY WITH PROPOFOL;  Surgeon: Thornton Park, MD;  Location: [facility];  Service: Gastroenterology;  Laterality: N/A;   KNEE ARTHROSCOPY WITH MEDIAL MENISECTOMY Right 04/01/2019   Procedure: KNEE ARTHROSCOPY WITH MEDIAL MENISECTOMY;  Surgeon: Hiram Gash, MD;  Location: [facility];  Service: Orthopedics;  Laterality: Right;   KNEE ARTHROSCOPY WITH MEDIAL MENISECTOMY Left 01/06/2020   Procedure: KNEE ARTHROSCOPY WITH MEDIAL MENISECTOMY;  Surgeon: Hiram Gash, MD;  Location: [facility];  Service: Orthopedics;  Laterality: Left;   LUNG BIOPSY     RIGHT HEART CATH N/A 01/30/2017   Procedure: Right Heart Cath;  Surgeon: Kirk Ruths  Claris Gladden, MD;  Location: [facility];  Service: Cardiovascular;  Laterality: N/A;    OB History   No obstetric history on file.      Home Medications    Prior to Admission medications   Medication Sig Start Date End Date Taking? Authorizing Provider  methocarbamol (ROBAXIN) 500 MG tablet Take 1 tablet (500 mg total) by mouth 2 (two) times daily as needed for muscle spasms. 11/16/22  Yes Zahrah Sutherlin, Hildred Alamin E, FNP  Accu-Chek Softclix Lancets lancets Use to check blood sugars twice a day 06/29/20   Binnie Rail, MD  albuterol (PROVENTIL HFA;VENTOLIN HFA) 108 (90 Base) MCG/ACT inhaler Inhale 2 puffs into the lungs every 4 (four) hours  as needed for wheezing or shortness of breath. 07/08/17   Janne Napoleon, NP  APPLE CIDER VINEGAR PO Take 450 mg by mouth daily.    [provider]  aspirin EC 81 MG tablet Take 81 mg by mouth daily. Swallow whole.    [provider]  atorvastatin (LIPITOR) 20 MG tablet TAKE 1 TABLET BY MOUTH DAILY 08/27/22   Binnie Rail, MD  B COMPLEX VITAMINS SL Place 1 mL under the tongue 4 (four) times a week.    [provider]  Blood Glucose Monitoring Suppl (ACCU-CHEK AVIVA PLUS) w/Device KIT Use to check blood sugars twice a day 07/18/22   Binnie Rail, MD  calcium-vitamin D (OSCAL WITH D) 500-200 MG-UNIT tablet Take 1 tablet by mouth daily.    [provider]  celecoxib (CELEBREX) 200 MG capsule TAKE ONE CAPSULE BY MOUTH TWICE A DAY 10/16/22   Binnie Rail, MD  Cholecalciferol (VITAMIN D3) 50 MCG (2000 UT) capsule Take 1 capsule (2,000 Units total) by mouth daily. 09/28/20   Plotnikov, Evie Lacks, MD  Dextromethorphan-guaiFENesin (MUCINEX DM MAXIMUM STRENGTH) 60-1200 MG TB12 Take 1 tablet by mouth every 12 (twelve) hours as needed (with flutter valve).    [provider]  ELDERBERRY PO Take 1,000 mg by mouth daily.    [provider]  famotidine (PEPCID) 40 MG tablet TAKE ONE TABLET BY MOUTH DAILY 08/21/21   Binnie Rail, MD  fluticasone (FLONASE) 50 MCG/ACT nasal spray Place 2 sprays into both nostrils daily.    [provider]  glucose blood (ACCU-CHEK AVIVA PLUS) test strip USE TO CHECK FOR BLOOD SUGAR TWO TIMES A DAY 07/10/21   Burns, Claudina Lick, MD  JARDIANCE 25 MG TABS tablet TAKE ONE TABLET BY MOUTH EVERY MORNING WITH OR WITHOUT FOOD 11/08/22   Binnie Rail, MD  loratadine (CLARITIN) 10 MG tablet Take 10 mg by mouth at bedtime.    [provider]  losartan (COZAAR) 25 MG tablet TAKE 1/2 TABLET BY MOUTH DAILY 05/28/22   Binnie Rail, MD  meloxicam (MOBIC) 15 MG tablet     [provider]  meloxicam (MOBIC) 15 MG tablet  Take 1 tablet (15 mg total) by mouth daily. 11/07/22   Glennon Mac, DO  mometasone-formoterol (DULERA) 100-5 MCG/ACT AERO Inhale 1 puff into the lungs 2 (two) times daily.     [provider]  montelukast (SINGULAIR) 10 MG tablet Take 1 tablet (10 mg total) by mouth at bedtime. 02/02/16   Binnie Rail, MD  MOUNJARO 2.5 MG/0.5ML Pen INJECT 2.5 MG UNDER THE SKIN ONCE WEEKLY 11/08/22   Burns, Claudina Lick, MD  ondansetron (ZOFRAN) 4 MG tablet     [provider]  OXYGEN 2lpm with sleep and 3lpm with exertion  [provider]  pantoprazole (PROTONIX) 40 MG tablet TAKE ONE TABLET BY MOUTH TWICE A DAY BEFORE MEALS 06/26/22   Binnie Rail, MD  predniSONE (DELTASONE) 5 MG tablet TAKE ONE TABLET BY MOUTH DAILY 11/13/22   Tanda Rockers, MD  repaglinide (PRANDIN) 0.5 MG tablet TAKE TWO TABLETS BY MOUTH THREE TIMES A DAY BEFORE A MEAL 10/16/22   Binnie Rail, MD  Respiratory Therapy Supplies (FLUTTER) DEVI Use as directed 07/12/17   Tanda Rockers, MD  sucralfate (CARAFATE) 1 g tablet TAKE ONE TABLET BY MOUTH FOUR TIMES A DAY WITH MEALS AND AT BEDTIME 07/19/21   Burns, Claudina Lick, MD  TRULICITY 4.5 0000000 SOPN INJECT 4.5 MG UNDER THE SKIN ONCE WEEKLY 06/14/22   Binnie Rail, MD  Turmeric 500 MG CAPS Take 500 mg by mouth daily.    [provider]  venlafaxine XR (EFFEXOR-XR) 37.5 MG 24 hr capsule TAKE ONE CAPSULE BY MOUTH DAILY WITH BREAKFAST 03/01/22   Burns, Claudina Lick, MD  VITRON-C 65-125 MG TABS Take 1 tablet by mouth daily.    [provider]    Family History Family History  Problem Relation Age of Onset   Sarcoidosis Mother        dxed by transbrochial bx   Allergies Mother    Heart disease Father    Diabetes Father    Allergies Father    Coronary artery disease Father    Cancer Maternal Grandmother        CA of unknown type   Cancer Paternal Grandmother        CA of unknown type   Colon cancer Neg Hx    Esophageal cancer Neg Hx    Rectal cancer  Neg Hx    Stomach cancer Neg Hx     Social History Social History   Tobacco Use   Smoking status: Never   Smokeless tobacco: Never   Tobacco comments:    {  Vaping Use   Vaping Use: Never used  Substance Use Topics   Alcohol use: No    Comment: maybe 2 times per year   Drug use: No     Allergies   Penicillins, Metformin and related, Peach flavor, and Levaquin [levofloxacin]   Review of Systems Review of Systems Per HPI  Physical Exam Triage Vital Signs ED Triage Vitals  Enc Vitals Group     BP 11/16/22 0812 130/87     Pulse Rate 11/16/22 0812 87     Resp 11/16/22 0812 18     Temp 11/16/22 0812 97.8 F (36.6 C)     Temp Source 11/16/22 0812 Oral     SpO2 11/16/22 0812 98 %     Weight --      Height --      Head Circumference --      Peak Flow --      Pain Score 11/16/22 0811 5     Pain Loc --      Pain Edu? --      Excl. in [place]? --    No data found.  Updated Vital Signs BP 130/87 (BP Location: Left Arm)   Pulse 87   Temp 97.8 F (36.6 C) (Oral)   Resp 18   SpO2 98%   Visual Acuity Right Eye Distance:   Left Eye Distance:   Bilateral Distance:    Right Eye Near:   Left Eye Near:    Bilateral Near:     Physical Exam Constitutional:  General: She is not in acute distress.    Appearance: Normal appearance. She is not toxic-appearing or diaphoretic.  HENT:     Head: Normocephalic and atraumatic.  Eyes:     Extraocular Movements: Extraocular movements intact.     Conjunctiva/sclera: Conjunctivae normal.  Neck:      Comments: Tenderness to palpation to right lateral posterior neck that extends slightly into her shoulder area.  There is no obvious swelling, discoloration, lacerations, abrasions noted.  Patient has limited range of motion with rotation of neck due to pain.  There is no direct spinal tenderness, crepitus, step-off noted. Pulmonary:     Effort: Pulmonary effort is normal.  Neurological:     General: No focal deficit present.      Mental Status: She is alert and oriented to person, place, and time. Mental status is at baseline.  Psychiatric:        Mood and Affect: Mood normal.        Behavior: Behavior normal.        Thought Content: Thought content normal.        Judgment: Judgment normal.      UC Treatments / Results  Labs (all labs ordered are listed, but only abnormal results are displayed) Labs Reviewed - No data to display  EKG   Radiology No results found.  Procedures Procedures (including critical care time)  Medications Ordered in UC Medications - No data to display  Initial Impression / Assessment and Plan / UC Course  I have reviewed the triage vital signs and the nursing notes.  Pertinent labs & imaging results that were available during my care of the patient were reviewed by me and considered in my medical decision making (see chart for details).     Physical exam is consistent with neck muscle strain.  No concern for meningitis.  Do not think that imaging is necessary given no injury and no direct spinal tenderness.  Will treat with muscle relaxer. No obvious contraindications to muscle relaxers noted in patient's history.  Patient has taken muscle relaxers before and tolerated well.  Advised patient that muscle relaxer can make her drowsy and do not drive or drink alcohol with taking it.  Advised supportive care as well.  Patient was advised to follow-up if any symptoms persist or worsen.  Discussed with patient that she could have increased sedation/effects with taking muscle relaxer in conjunction with effexor.  She verbalized understanding of this.  Patient verbalized understanding and was agreeable with plan. Final Clinical Impressions(s) / UC Diagnoses   Final diagnoses:  Strain of neck muscle, initial encounter     Discharge Instructions      You have a neck muscle strain which is being treated with a muscle relaxer. Please be advised that this can make you drowsy so do  not drive or drink alcohol with it. Follow up if symptoms persist or worsen.     ED Prescriptions     Medication Sig Dispense Auth. Provider   methocarbamol (ROBAXIN) 500 MG tablet Take 1 tablet (500 mg total) by mouth 2 (two) times daily as needed for muscle spasms. 20 tablet [place], Michele Rockers, [place]      PDMP not reviewed this encounter.   Teodora Medici, [place] 11/16/22 [place], [place],  11/16/22 [phone]

## 2022-11-16 NOTE — ED Triage Notes (Signed)
Pt c/o neck pain (back of neck) that is worse when attempting to turn.  Started: Tuesday  Home interventions: tylenol, motrin, icy hot, heat/ cool pack

## 2022-11-16 NOTE — Discharge Instructions (Signed)
You have a neck muscle strain which is being treated with a muscle relaxer. Please be advised that this can make you drowsy so do not drive or drink alcohol with it. Follow up if symptoms persist or worsen.

## 2022-11-26 DIAGNOSIS — M722 Plantar fascial fibromatosis: Secondary | ICD-10-CM | POA: Diagnosis not present

## 2022-11-26 DIAGNOSIS — R091 Pleurisy: Secondary | ICD-10-CM | POA: Diagnosis not present

## 2022-11-27 ENCOUNTER — Encounter: Payer: Self-pay | Admitting: Internal Medicine

## 2022-11-27 NOTE — Patient Instructions (Addendum)
      Blood work was ordered.   The lab is on the first floor.    Medications changes include :   none    A referral was ordered for allergy.     Someone will call you to schedule an appointment.    Return in about 3 months (around 02/26/2023) for follow up.

## 2022-11-27 NOTE — Progress Notes (Unsigned)
Benito Mccreedy D.[place] Phone: [phone]   Assessment and Plan:     There are no diagnoses linked to this encounter.  ***   Pertinent previous records reviewed include ***   Follow Up: ***     Subjective:   I, [name], am serving as a Education administrator for Doctor Glennon Mac   Chief Complaint: right ankle pain   HPI:    05/28/22 Patient is a 52 year old female complaining of right ankle pain. Patient states that she has arthritis and knows shes obese has osteopenia , pain has continued and she is medicating to much , medial ankle pain , fees like a hot nail being inserted , pain is happening in her sleep now,has used prednisone chronic , states she needs cortisone , has used heat, ice and tylenol, Friday used '15mg'$  , 10 mg,5 mgof prednisone that was the only way she could get through the weekend, thinks she needs CSI    11/07/2022 Patient states that her ankle and heel are both in pain feels like there is a ball of nerves in the heel of her foot , the pain radiates to the medial ankle a sharp sticking pain , ankle is swollen, she has intermittent pain when she tries to stretch , she has relief when she is in her high heels , her ankle is angry, has tried heat ice and ointments has been in pain for 2 weeks   11/28/2022 Patient states    Relevant Historical Information: Hypertension, GERD, DM type II, interstitial lung disease  Additional pertinent review of systems negative.   Current Outpatient Medications:    Accu-Chek Softclix Lancets lancets, Use to check blood sugars twice a day, Disp: 100 each, Rfl: 12   albuterol (PROVENTIL HFA;VENTOLIN HFA) 108 (90 Base) MCG/ACT inhaler, Inhale 2 puffs into the lungs every 4 (four) hours as needed for wheezing or shortness of breath., Disp: 1 Inhaler, Rfl: 0   APPLE CIDER VINEGAR PO, Take 450 mg by mouth daily., Disp: , Rfl:    aspirin EC 81 MG tablet, Take  81 mg by mouth daily. Swallow whole., Disp: , Rfl:    atorvastatin (LIPITOR) 20 MG tablet, TAKE 1 TABLET BY MOUTH DAILY, Disp: 90 tablet, Rfl: 1   B COMPLEX VITAMINS SL, Place 1 mL under the tongue 4 (four) times a week., Disp: , Rfl:    Blood Glucose Monitoring Suppl (ACCU-CHEK AVIVA PLUS) w/Device KIT, Use to check blood sugars twice a day, Disp: 1 kit, Rfl: 0   calcium-vitamin D (OSCAL WITH D) 500-200 MG-UNIT tablet, Take 1 tablet by mouth daily., Disp: , Rfl:    celecoxib (CELEBREX) 200 MG capsule, TAKE ONE CAPSULE BY MOUTH TWICE A DAY, Disp: 60 capsule, Rfl: 5   Cholecalciferol (VITAMIN D3) 50 MCG (2000 UT) capsule, Take 1 capsule (2,000 Units total) by mouth daily., Disp: 100 capsule, Rfl: 3   Dextromethorphan-guaiFENesin (MUCINEX DM MAXIMUM STRENGTH) 60-1200 MG TB12, Take 1 tablet by mouth every 12 (twelve) hours as needed (with flutter valve)., Disp: , Rfl:    ELDERBERRY PO, Take 1,000 mg by mouth daily., Disp: , Rfl:    famotidine (PEPCID) 40 MG tablet, TAKE ONE TABLET BY MOUTH DAILY, Disp: 30 tablet, Rfl: 0   fluticasone (FLONASE) 50 MCG/ACT nasal spray, Place 2 sprays into both nostrils daily., Disp: , Rfl:    glucose blood (ACCU-CHEK AVIVA PLUS) test strip, USE TO CHECK FOR BLOOD SUGAR  TWO TIMES A DAY, Disp: 100 strip, Rfl: 0   JARDIANCE 25 MG TABS tablet, TAKE ONE TABLET BY MOUTH EVERY MORNING WITH OR WITHOUT FOOD, Disp: 90 tablet, Rfl: 0   loratadine (CLARITIN) 10 MG tablet, Take 10 mg by mouth at bedtime., Disp: , Rfl:    losartan (COZAAR) 25 MG tablet, TAKE 1/2 TABLET BY MOUTH DAILY, Disp: 45 tablet, Rfl: 1   meloxicam (MOBIC) 15 MG tablet, , Disp: , Rfl:    meloxicam (MOBIC) 15 MG tablet, Take 1 tablet (15 mg total) by mouth daily., Disp: 30 tablet, Rfl: 0   methocarbamol (ROBAXIN) 500 MG tablet, Take 1 tablet (500 mg total) by mouth 2 (two) times daily as needed for muscle spasms., Disp: 20 tablet, Rfl: 0   mometasone-formoterol (DULERA) 100-5 MCG/ACT AERO, Inhale 1 puff into the  lungs 2 (two) times daily. , Disp: , Rfl:    montelukast (SINGULAIR) 10 MG tablet, Take 1 tablet (10 mg total) by mouth at bedtime., Disp: 30 tablet, Rfl: 5   MOUNJARO 2.5 MG/0.5ML Pen, INJECT 2.5 MG UNDER THE SKIN ONCE WEEKLY, Disp: 2 mL, Rfl: 0   ondansetron (ZOFRAN) 4 MG tablet, , Disp: , Rfl:    OXYGEN, 2lpm with sleep and 3lpm with exertion, Disp: , Rfl:    pantoprazole (PROTONIX) 40 MG tablet, TAKE ONE TABLET BY MOUTH TWICE A DAY BEFORE MEALS, Disp: 120 tablet, Rfl: 2   predniSONE (DELTASONE) 5 MG tablet, TAKE ONE TABLET BY MOUTH DAILY, Disp: 90 tablet, Rfl: 0   repaglinide (PRANDIN) 0.5 MG tablet, TAKE TWO TABLETS BY MOUTH THREE TIMES A DAY BEFORE A MEAL, Disp: 180 tablet, Rfl: 0   Respiratory Therapy Supplies (FLUTTER) DEVI, Use as directed, Disp: 1 each, Rfl: 0   sucralfate (CARAFATE) 1 g tablet, TAKE ONE TABLET BY MOUTH FOUR TIMES A DAY WITH MEALS AND AT BEDTIME, Disp: 40 tablet, Rfl: 0   TRULICITY 4.5 0000000 SOPN, INJECT 4.5 MG UNDER THE SKIN ONCE WEEKLY, Disp: 2 mL, Rfl: 1   Turmeric 500 MG CAPS, Take 500 mg by mouth daily., Disp: , Rfl:    venlafaxine XR (EFFEXOR-XR) 37.5 MG 24 hr capsule, TAKE ONE CAPSULE BY MOUTH DAILY WITH BREAKFAST, Disp: 90 capsule, Rfl: 2   VITRON-C 65-125 MG TABS, Take 1 tablet by mouth daily., Disp: , Rfl:    Objective:     There were no vitals filed for this visit.    There is no height or weight on file to calculate BMI.    Physical Exam:    ***   Electronically signed by:  Benito Mccreedy D.[facility] 4:29 PM 11/27/22

## 2022-11-27 NOTE — Progress Notes (Unsigned)
Subjective:    Patient ID: [name], female    DOB: 04-Feb-1971, 52 y.o.   MRN: [id number]     HPI [name] is here for follow up of her chronic medical problems, including DM, htn, hld, obesity, anxiety, GERD, chronic left knee pain  Has had some R TMJ pain recently.  Allergies in spring are bad - allergies not controlled - wonders about allergy testing.  Foot flare, neck pain.  Has not been able to exercise due to pain.    Taking mounjaro - feels less hungry, getting full quicker.    Medications and allergies reviewed with patient and updated if appropriate.  Current Outpatient Medications on File Prior to Visit  Medication Sig Dispense Refill   Accu-Chek Softclix Lancets lancets Use to check blood sugars twice a day 100 each 12   albuterol (PROVENTIL HFA;VENTOLIN HFA) 108 (90 Base) MCG/ACT inhaler Inhale 2 puffs into the lungs every 4 (four) hours as needed for wheezing or shortness of breath. 1 Inhaler 0   aspirin EC 81 MG tablet Take 81 mg by mouth daily. Swallow whole.     atorvastatin (LIPITOR) 20 MG tablet TAKE 1 TABLET BY MOUTH DAILY 90 tablet 1   B COMPLEX VITAMINS SL Place 1 mL under the tongue 4 (four) times a week.     Blood Glucose Monitoring Suppl (ACCU-CHEK AVIVA PLUS) w/Device KIT Use to check blood sugars twice a day 1 kit 0   calcium-vitamin D (OSCAL WITH D) 500-200 MG-UNIT tablet Take 1 tablet by mouth daily.     celecoxib (CELEBREX) 200 MG capsule TAKE ONE CAPSULE BY MOUTH TWICE A DAY 60 capsule 5   Cholecalciferol (VITAMIN D3) 50 MCG (2000 UT) capsule Take 1 capsule (2,000 Units total) by mouth daily. 100 capsule 3   Dextromethorphan-guaiFENesin (MUCINEX DM MAXIMUM STRENGTH) 60-1200 MG TB12 Take 1 tablet by mouth every 12 (twelve) hours as needed (with flutter valve).     ELDERBERRY PO Take 1,000 mg by mouth daily.     famotidine (PEPCID) 40 MG tablet TAKE ONE TABLET BY MOUTH DAILY 30 tablet 0   fluticasone (FLONASE) 50 MCG/ACT nasal spray Place 2  sprays into both nostrils daily.     glucose blood (ACCU-CHEK AVIVA PLUS) test strip USE TO CHECK FOR BLOOD SUGAR TWO TIMES A DAY 100 strip 0   JARDIANCE 25 MG TABS tablet TAKE ONE TABLET BY MOUTH EVERY MORNING WITH OR WITHOUT FOOD 90 tablet 0   loratadine (CLARITIN) 10 MG tablet Take 10 mg by mouth at bedtime.     losartan (COZAAR) 25 MG tablet TAKE 1/2 TABLET BY MOUTH DAILY 45 tablet 1   meloxicam (MOBIC) 15 MG tablet Take 1 tablet (15 mg total) by mouth daily. 30 tablet 0   methocarbamol (ROBAXIN) 500 MG tablet Take 1 tablet (500 mg total) by mouth 2 (two) times daily as needed for muscle spasms. 20 tablet 0   mometasone-formoterol (DULERA) 100-5 MCG/ACT AERO Inhale 1 puff into the lungs 2 (two) times daily.      montelukast (SINGULAIR) 10 MG tablet Take 1 tablet (10 mg total) by mouth at bedtime. 30 tablet 5   MOUNJARO 2.5 MG/0.5ML Pen INJECT 2.5 MG UNDER THE SKIN ONCE WEEKLY 2 mL 0   OXYGEN 2lpm with sleep and 3lpm with exertion     pantoprazole (PROTONIX) 40 MG tablet TAKE ONE TABLET BY MOUTH TWICE A DAY BEFORE MEALS 120 tablet 2   predniSONE (DELTASONE) 5 MG tablet TAKE  ONE TABLET BY MOUTH DAILY 90 tablet 0   repaglinide (PRANDIN) 0.5 MG tablet TAKE TWO TABLETS BY MOUTH THREE TIMES A DAY BEFORE A MEAL 180 tablet 0   Respiratory Therapy Supplies (FLUTTER) DEVI Use as directed 1 each 0   sucralfate (CARAFATE) 1 g tablet TAKE ONE TABLET BY MOUTH FOUR TIMES A DAY WITH MEALS AND AT BEDTIME 40 tablet 0   Turmeric 500 MG CAPS Take 500 mg by mouth daily.     venlafaxine XR (EFFEXOR-XR) 37.5 MG 24 hr capsule TAKE ONE CAPSULE BY MOUTH DAILY WITH BREAKFAST 90 capsule 2   VITRON-C 65-125 MG TABS Take 1 tablet by mouth daily.     No current facility-administered medications on file prior to visit.     Review of Systems  Constitutional:  Negative for fever.  HENT:  Positive for congestion and postnasal drip.   Respiratory:  Positive for shortness of breath (mild). Negative for cough and wheezing.    Cardiovascular:  Negative for chest pain, palpitations and leg swelling.  Neurological:  Negative for light-headedness and headaches.       Objective:   Vitals:   11/28/22 0803  BP: 130/72  Pulse: 81  Temp: 98.3 F (36.8 C)  SpO2: 95%   BP Readings from Last 3 Encounters:  11/28/22 130/72  11/16/22 130/87  06/07/22 122/78   Wt Readings from Last 3 Encounters:  11/28/22 246 lb (111.6 kg)  11/07/22 248 lb (112.5 kg)  06/07/22 250 lb (113.4 kg)   Body mass index is 48.04 kg/m.    Physical Exam Constitutional:      General: She is not in acute distress.    Appearance: Normal appearance.  HENT:     Head: Normocephalic and atraumatic.  Eyes:     Conjunctiva/sclera: Conjunctivae normal.  Cardiovascular:     Rate and Rhythm: Normal rate and regular rhythm.     Heart sounds: Normal heart sounds.  Pulmonary:     Effort: Pulmonary effort is normal. No respiratory distress.     Breath sounds: Normal breath sounds. No wheezing.  Musculoskeletal:     Cervical back: Neck supple.     Right lower leg: No edema.     Left lower leg: No edema.  Lymphadenopathy:     Cervical: No cervical adenopathy.  Skin:    General: Skin is warm and dry.     Findings: No rash.  Neurological:     Mental Status: She is alert. Mental status is at baseline.  Psychiatric:        Mood and Affect: Mood normal.        Behavior: Behavior normal.        Lab Results  Component Value Date   WBC 6.6 06/07/2022   HGB 14.7 06/07/2022   HCT 45.9 06/07/2022   PLT 190.0 06/07/2022   GLUCOSE 154 (H) 06/07/2022   CHOL 189 06/07/2022   TRIG 63.0 06/07/2022   HDL 56.20 06/07/2022   LDLDIRECT 153.1 12/08/2012   LDLCALC 120 (H) 06/07/2022   ALT 25 06/07/2022   AST 23 06/07/2022   NA 139 06/07/2022   K 3.8 06/07/2022   CL 100 06/07/2022   CREATININE 0.70 06/07/2022   BUN 13 06/07/2022   CO2 31 06/07/2022   TSH 1.19 06/07/2022   INR 1.01 01/30/2017   HGBA1C 10.3 (H) 06/07/2022   MICROALBUR  <0.7 06/07/2022     Assessment & Plan:    See Problem List for Assessment and Plan of chronic medical problems.

## 2022-11-28 ENCOUNTER — Ambulatory Visit (INDEPENDENT_AMBULATORY_CARE_PROVIDER_SITE_OTHER): Payer: Medicare Other | Admitting: Sports Medicine

## 2022-11-28 ENCOUNTER — Ambulatory Visit (INDEPENDENT_AMBULATORY_CARE_PROVIDER_SITE_OTHER): Payer: Medicare Other | Admitting: Internal Medicine

## 2022-11-28 VITALS — BP 130/72 | HR 81 | Temp 98.3°F | Ht 60.0 in | Wt 246.0 lb

## 2022-11-28 VITALS — BP 130/72 | HR 81 | Ht 60.0 in | Wt 246.0 lb

## 2022-11-28 DIAGNOSIS — M2141 Flat foot [pes planus] (acquired), right foot: Secondary | ICD-10-CM | POA: Diagnosis not present

## 2022-11-28 DIAGNOSIS — M79671 Pain in right foot: Secondary | ICD-10-CM

## 2022-11-28 DIAGNOSIS — G8929 Other chronic pain: Secondary | ICD-10-CM | POA: Diagnosis not present

## 2022-11-28 DIAGNOSIS — K219 Gastro-esophageal reflux disease without esophagitis: Secondary | ICD-10-CM

## 2022-11-28 DIAGNOSIS — M25571 Pain in right ankle and joints of right foot: Secondary | ICD-10-CM

## 2022-11-28 DIAGNOSIS — M25562 Pain in left knee: Secondary | ICD-10-CM

## 2022-11-28 DIAGNOSIS — J309 Allergic rhinitis, unspecified: Secondary | ICD-10-CM | POA: Diagnosis not present

## 2022-11-28 DIAGNOSIS — E785 Hyperlipidemia, unspecified: Secondary | ICD-10-CM

## 2022-11-28 DIAGNOSIS — I10 Essential (primary) hypertension: Secondary | ICD-10-CM | POA: Diagnosis not present

## 2022-11-28 DIAGNOSIS — E1165 Type 2 diabetes mellitus with hyperglycemia: Secondary | ICD-10-CM | POA: Diagnosis not present

## 2022-11-28 DIAGNOSIS — M26621 Arthralgia of right temporomandibular joint: Secondary | ICD-10-CM | POA: Diagnosis not present

## 2022-11-28 DIAGNOSIS — M76821 Posterior tibial tendinitis, right leg: Secondary | ICD-10-CM

## 2022-11-28 DIAGNOSIS — M2142 Flat foot [pes planus] (acquired), left foot: Secondary | ICD-10-CM | POA: Diagnosis not present

## 2022-11-28 DIAGNOSIS — F419 Anxiety disorder, unspecified: Secondary | ICD-10-CM | POA: Diagnosis not present

## 2022-11-28 DIAGNOSIS — M26629 Arthralgia of temporomandibular joint, unspecified side: Secondary | ICD-10-CM | POA: Insufficient documentation

## 2022-11-28 LAB — LIPID PANEL
Cholesterol: 177 mg/dL (ref 0–200)
HDL: 51.8 mg/dL (ref >39.00)
LDL Cholesterol: 110 mg/dL — ABNORMAL HIGH (ref 0–99)
NonHDL: 125.04
Total CHOL/HDL Ratio: 3
Triglycerides: 75 mg/dL (ref 0.0–149.0)
VLDL: 15 mg/dL (ref 0.0–40.0)

## 2022-11-28 LAB — COMPREHENSIVE METABOLIC PANEL
ALT: 25 U/L (ref 0–35)
AST: 25 U/L (ref 0–37)
Albumin: 3.8 g/dL (ref 3.5–5.2)
Alkaline Phosphatase: 82 U/L (ref 39–117)
BUN: 18 mg/dL (ref 6–23)
CO2: 29 mEq/L (ref 19–32)
Calcium: 9.6 mg/dL (ref 8.4–10.5)
Chloride: 101 mEq/L (ref 96–112)
Creatinine, Ser: 0.64 mg/dL (ref 0.40–1.20)
GFR: 102.87 mL/min (ref >60.00)
Glucose, Bld: 137 mg/dL — ABNORMAL HIGH (ref 70–99)
Potassium: 4.5 mEq/L (ref 3.5–5.1)
Sodium: 138 mEq/L (ref 135–145)
Total Bilirubin: 0.5 mg/dL (ref 0.2–1.2)
Total Protein: 7.7 g/dL (ref 6.0–8.3)

## 2022-11-28 LAB — HEMOGLOBIN A1C: Hgb A1c MFr Bld: 9.3 % — ABNORMAL HIGH (ref 4.6–6.5)

## 2022-11-28 NOTE — Assessment & Plan Note (Addendum)
Chronic Stressed weight loss - has lost 4 lbs since last visit Mounjaro is helping - will titrate up dose as tolerated Discussed regular exercise -she will do it when her joints are better Stressed decreasing calorie intake-eating low sugar/carbohydrate diet high in protein and vegetables

## 2022-11-28 NOTE — Assessment & Plan Note (Addendum)
Chronic Controlled, Stable  feels really good now - hope to come off medication later this year Continue Effexor 37.5 mg daily

## 2022-11-28 NOTE — Patient Instructions (Addendum)
Good to see you  Podiatry referral

## 2022-11-28 NOTE — Assessment & Plan Note (Addendum)
Chronic   Lab Results  Component Value Date   HGBA1C 10.3 (H) 06/07/2022   Sugars not ideally controlled Testing sugars 1 times a day Check A1c Continue Jardiance 25 mg daily, mounjaro 2.5 mg weekly -- will increase next month Stressed regular exercise, diabetic diet Stressed the importance of weight loss

## 2022-11-28 NOTE — Assessment & Plan Note (Signed)
Chronic Blood pressure well controlled CMP Continue losartan 12.5 mg daily

## 2022-11-28 NOTE — Assessment & Plan Note (Addendum)
Chronic Also with asthma - sees pulm - currently controlled Not ideally controlled - especially In spring Will refer to allergy

## 2022-11-28 NOTE — Assessment & Plan Note (Signed)
Acute Having R TMJ pain recently H/o grinding - advised to see dentist to make sure she is not grinding Meloxicam 15 mg daily prn Can try voltaren gel as needed Ice, heat

## 2022-11-28 NOTE — Assessment & Plan Note (Signed)
Chronic GERD controlled Continue pepcid 40 mg daily, protonix 40 mg bid Hope with weight loss to be able to dec medication

## 2022-11-28 NOTE — Assessment & Plan Note (Signed)
Chronic °Regular exercise and healthy diet encouraged °Check lipid panel  °Continue atorvastatin 20 mg daily °

## 2022-11-28 NOTE — Assessment & Plan Note (Signed)
Chronic Secondary to osteoarthritis Stressed weight loss, regular exercise Continue Celebrex 200 mg twice daily as needed

## 2022-12-02 ENCOUNTER — Encounter: Payer: Self-pay | Admitting: Internal Medicine

## 2022-12-03 MED ORDER — TIRZEPATIDE 5 MG/0.5ML ~~LOC~~ SOAJ: 5.0000 mg | SUBCUTANEOUS | 0 refills | Status: DC

## 2022-12-09 ENCOUNTER — Other Ambulatory Visit: Payer: Self-pay | Admitting: Internal Medicine

## 2022-12-12 ENCOUNTER — Encounter: Payer: Self-pay | Admitting: Podiatry

## 2022-12-12 ENCOUNTER — Ambulatory Visit: Payer: Medicare Other | Admitting: Podiatry

## 2022-12-12 ENCOUNTER — Ambulatory Visit (INDEPENDENT_AMBULATORY_CARE_PROVIDER_SITE_OTHER): Payer: Medicare Other

## 2022-12-12 DIAGNOSIS — E1149 Type 2 diabetes mellitus with other diabetic neurological complication: Secondary | ICD-10-CM

## 2022-12-12 DIAGNOSIS — M25571 Pain in right ankle and joints of right foot: Secondary | ICD-10-CM | POA: Diagnosis not present

## 2022-12-12 DIAGNOSIS — M76821 Posterior tibial tendinitis, right leg: Secondary | ICD-10-CM | POA: Diagnosis not present

## 2022-12-12 DIAGNOSIS — E1140 Type 2 diabetes mellitus with diabetic neuropathy, unspecified: Secondary | ICD-10-CM

## 2022-12-12 DIAGNOSIS — M7751 Other enthesopathy of right foot: Secondary | ICD-10-CM

## 2022-12-12 MED ORDER — TRIAMCINOLONE ACETONIDE 10 MG/ML IJ SUSP
10.0000 mg | Freq: Once | INTRAMUSCULAR | Status: AC
  Administered 2022-12-12: 10 mg

## 2022-12-12 NOTE — Progress Notes (Signed)
Subjective:   Patient ID: [name], female   DOB: 52 y.o.   MRN: [id number]   HPI Patient presents on referral from physician with chronic pain in the medial ankle right and also in the ankle right.  Patient has significant flatfoot deformity has diabetes needs to be active and unable to do the exercise that she needs to do because of her chronic foot and ankle pain.  She had previous injection last year she has had immobilization and the pain is intensified recently.  Patient does not smoke tries to be active needs to lose weight and is on medication to help with weight loss but also needs to be active   Review of Systems  All other systems reviewed and are negative.       Objective:  Physical Exam Vitals and nursing note reviewed.  Constitutional:      Appearance: She is well-developed.  Pulmonary:     Effort: Pulmonary effort is normal.  Musculoskeletal:        General: Normal range of motion.  Skin:    General: Skin is warm.  Neurological:     Mental Status: She is alert.     Vascular status was found to be intact neurologically moderate diminishment sharp dull vibratory with significant flatfoot deformity right over left with swelling pain of the posterior tibial tendon right as it comes underneath the medial malleolus and quite a bit of pain in the sinus tarsi.  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  At this point I did go ahead and focused on the ankle and carefully injected the sinus tarsi 3 mg Kenalog 5 mg Xylocaine and I then discussed the flatfoot deformity posterior tibial tendinitis and I recommend customized orthotics as  over-the-counter orthotics have not been successful to try to lift the arch and take the stress of the posterior tibial tendon allowing her to increase her activity levels.  If this does not work we will have to consider MRI or other treatment plans with possible surgery that would like to avoid  X-rays indicate significant depression of the arch with loss of function noted right over left

## 2022-12-13 ENCOUNTER — Other Ambulatory Visit: Payer: Self-pay | Admitting: Podiatry

## 2022-12-13 DIAGNOSIS — E1140 Type 2 diabetes mellitus with diabetic neuropathy, unspecified: Secondary | ICD-10-CM

## 2022-12-13 DIAGNOSIS — M76821 Posterior tibial tendinitis, right leg: Secondary | ICD-10-CM

## 2022-12-13 DIAGNOSIS — M25571 Pain in right ankle and joints of right foot: Secondary | ICD-10-CM

## 2022-12-13 DIAGNOSIS — M7751 Other enthesopathy of right foot: Secondary | ICD-10-CM

## 2022-12-17 ENCOUNTER — Ambulatory Visit
Admission: RE | Admit: 2022-12-17 | Discharge: 2022-12-17 | Disposition: A | Discharge status: Home / Self Care | Payer: Medicare Other | Source: Ambulatory Visit | Attending: Internal Medicine | Admitting: Internal Medicine

## 2022-12-17 DIAGNOSIS — Z1239 Encounter for other screening for malignant neoplasm of breast: Secondary | ICD-10-CM

## 2022-12-17 DIAGNOSIS — Z1231 Encounter for screening mammogram for malignant neoplasm of breast: Secondary | ICD-10-CM | POA: Diagnosis not present

## 2022-12-19 ENCOUNTER — Other Ambulatory Visit: Payer: Self-pay

## 2022-12-19 ENCOUNTER — Other Ambulatory Visit: Payer: Self-pay | Admitting: Internal Medicine

## 2022-12-25 DIAGNOSIS — M722 Plantar fascial fibromatosis: Secondary | ICD-10-CM | POA: Diagnosis not present

## 2022-12-25 DIAGNOSIS — R091 Pleurisy: Secondary | ICD-10-CM | POA: Diagnosis not present

## 2023-01-03 ENCOUNTER — Other Ambulatory Visit: Payer: Self-pay | Admitting: Internal Medicine

## 2023-01-12 NOTE — Progress Notes (Unsigned)
Patient does not have SDOH needs. 

## 2023-01-15 ENCOUNTER — Encounter: Payer: Self-pay | Admitting: Internal Medicine

## 2023-01-18 ENCOUNTER — Encounter: Payer: Self-pay | Admitting: *Deleted

## 2023-01-18 NOTE — Progress Notes (Signed)
Pt attended 01/12/23 screening event where b/p was 134/92. Pt given b/p results at the event to share with her PCP, with whom she has a future appt on 03/01/23. Pt did not identify any SDOH insecurities at the event and confirmed her PCP to Be Dr. Cheryll Cockayne, whom patient last saw on 11/28/22, when her b/p was 130/72. In-basket message with event b/p results sent to PCP. No additional health equity team support indicated at this time.

## 2023-01-22 ENCOUNTER — Other Ambulatory Visit: Payer: Self-pay

## 2023-01-22 ENCOUNTER — Ambulatory Visit (INDEPENDENT_AMBULATORY_CARE_PROVIDER_SITE_OTHER): Payer: Medicare Other | Admitting: Allergy & Immunology

## 2023-01-22 ENCOUNTER — Encounter: Payer: Self-pay | Admitting: Allergy & Immunology

## 2023-01-22 ENCOUNTER — Telehealth: Payer: Self-pay

## 2023-01-22 VITALS — BP 116/88 | HR 74 | Temp 98.0°F | Resp 16 | Ht 61.5 in | Wt 246.2 lb

## 2023-01-22 DIAGNOSIS — J84112 Idiopathic pulmonary fibrosis: Secondary | ICD-10-CM

## 2023-01-22 DIAGNOSIS — J310 Chronic rhinitis: Secondary | ICD-10-CM | POA: Diagnosis not present

## 2023-01-22 DIAGNOSIS — J709 Respiratory conditions due to unspecified external agent: Secondary | ICD-10-CM | POA: Diagnosis not present

## 2023-01-22 MED ORDER — BREZTRI AEROSPHERE 160-9-4.8 MCG/ACT IN AERO: 2.0000 | INHALATION_SPRAY | Freq: Two times a day (BID) | RESPIRATORY_TRACT | 12 refills | Status: DC

## 2023-01-22 MED ORDER — ALBUTEROL SULFATE HFA 108 (90 BASE) MCG/ACT IN AERS: 2.0000 | INHALATION_SPRAY | RESPIRATORY_TRACT | 0 refills | Status: AC | PRN

## 2023-01-22 MED ORDER — ALBUTEROL SULFATE (2.5 MG/3ML) 0.083% IN NEBU: 2.5000 mg | INHALATION_SOLUTION | Freq: Four times a day (QID) | RESPIRATORY_TRACT | 2 refills | Status: AC | PRN

## 2023-01-22 NOTE — Patient Instructions (Addendum)
1. Idiopathic pulmonary fibrosis - Lung testing was in the 40% range, but did improve with the albuterol nebulizer treatment. - We gave you a sample of Breztri to see if this helps. - [place] and Me form filled out so hopefully we can get you the medication for free.  - I will make sure that Dr. Sherene Sires knows we are following you and see if he has any other ideas for your care. - Spacer sample and demonstration provided. - Daily controller medication(s): Breztri two puffs twice daily - Prior to physical activity: albuterol 2 puffs 10-15 minutes before physical activity. - Rescue medications: albuterol 4 puffs every 4-6 hours as needed - Asthma control goals:  * Full participation in all desired activities (may need albuterol before activity) * Albuterol use two time or less a week on average (not counting use with activity) * Cough interfering with sleep two time or less a month * Oral steroids no more than once a year * No hospitalizations  2. Chronic rhinitis - Come back TOMORROW for testing with Chrissie Amada [facility]. - That way, we can get you back on your medications to help you feel better.  - We can come up with a better plan at that time.  3. Return in about 4 weeks (around 02/19/2023). You can have the follow up appointment with Dr. Dellis Anes or a Nurse Practicioner (our Nurse Practitioners are excellent and always have Physician oversight!).    Please inform us of any Emergency Department visits, hospitalizations, or changes in symptoms. Call us before going to the ED for breathing or allergy symptoms since we might be able to fit you in for a sick visit. Feel free to contact us anytime with any questions, problems, or concerns.  It was a pleasure to meet you today!  Websites that have reliable patient information: 1. American Academy of Asthma, Allergy, and Immunology: www.aaaai.org 2. Food Allergy Research and Education (FARE): foodallergy.org 3. Mothers of Asthmatics:  http://www.asthmacommunitynetwork.org 4. American College of Allergy, Asthma, and Immunology: www.acaai.org   COVID-19 Vaccine Information can be found at: [email or web address].[email or web address] For questions related to vaccine distribution or appointments, please email [email or web address] or call [phone].   We realize that you might be concerned about having an allergic reaction to the COVID19 vaccines. To help with that concern, WE ARE OFFERING THE COVID19 VACCINES IN OUR OFFICE! Ask the front desk for dates!     "Like" [place] on Facebook and Instagram for our latest updates!      A healthy democracy works best when [employer] participate! Make sure you are registered to vote! If you have moved or changed any of your contact information, you will need to get this updated before voting!  In some cases, you MAY be able to register to vote online: [email or web address]

## 2023-01-22 NOTE — Patient Instructions (Incomplete)
1. Idiopathic pulmonary fibrosis - Lung testing was in the 40% range yesterday, but did improve with the albuterol nebulizer treatment. - [place] out yesterday so hopefully we can get you the medication for free.  - Daily controller medication(s): Breztri two puffs twice daily - Prior to physical activity: albuterol 2 puffs 10-15 minutes before physical activity. - Rescue medications: albuterol 4 puffs every 4-6 hours as needed - Asthma control goals:  * Full participation in all desired activities (may need albuterol before activity) * Albuterol use two time or less a week on average (not counting use with activity) * Cough interfering with sleep two time or less a month * Oral steroids no more than once a year * No hospitalizations  2.  Seasonal and perennial rhinitis/allergic conjunctivitis - skin testing today is positive to grass pollen, ragweed mix, weed mix, tree mix, major mold mix 1, major mold mix 2, major mold mix 3, major mold mix 4, and dust mite with adequate controls. -Copy of skin test given -Avoidance measures as below - Continue to alternate between antihistamines - consider allergy injections when your breathing gets better -start Pataday (olopatadine) 1 drop in each eye once  a day as needed for itchy watery eyes - start Flonase (fluticasone) 1-2 sprays in each nostril once a day as needed for stuffy nose  3. Schedule a follow up appointment in 4-6 weeks or sooner if needed   Control of Dust Mite Allergen Dust mites play a major role in allergic asthma and rhinitis. They occur in environments with high humidity wherever human skin is found. Dust mites absorb humidity from the atmosphere (ie, they do not drink) and feed on organic matter (including shed human and animal skin). Dust mites are a microscopic type of insect that you cannot see with the naked eye. High levels of dust mites have been detected from mattresses, pillows, carpets, upholstered  furniture, bed covers, clothes, soft toys and any woven material. The principal allergen of the dust mite is found in its feces. A gram of dust may contain 1,000 mites and 250,000 fecal particles. Mite antigen is easily measured in the air during house cleaning activities. Dust mites do not bite and do not cause harm to humans, other than by triggering allergies/asthma.  Ways to decrease your exposure to dust mites in your home:  1. Encase mattresses, box springs and pillows with a mite-impermeable barrier or cover  2. Wash sheets, blankets and drapes weekly in hot water (130 F) with detergent and dry them in a dryer on the hot setting.  3. Have the room cleaned frequently with a vacuum cleaner and a damp dust-mop. For carpeting or rugs, vacuuming with a vacuum cleaner equipped with a high-efficiency particulate air (HEPA) filter. The dust mite allergic individual should not be in a room which is being cleaned and should wait 1 hour after cleaning before going into the room.  4. Do not sleep on upholstered furniture (eg, couches).  5. If possible removing carpeting, upholstered furniture and drapery from the home is ideal. Horizontal blinds should be eliminated in the rooms where the person spends the most time (bedroom, study, television room). Washable vinyl, roller-type shades are optimal.  6. Remove all non-washable stuffed toys from the bedroom. Wash stuffed toys weekly like sheets and blankets above.  7. Reduce indoor humidity to less than 50%. Inexpensive humidity monitors can be purchased at most hardware stores. Do not use a humidifier as can make the  problem worse and are not recommended.  Control of Mold Allergen Mold and fungi can grow on a variety of surfaces provided certain temperature and moisture conditions exist.  Outdoor molds grow on plants, decaying vegetation and soil.  The major outdoor mold, Alternaria and Cladosporium, are found in very high numbers during hot and dry  conditions.  Generally, a late Summer - Fall peak is seen for common outdoor fungal spores.  Rain will temporarily lower outdoor mold spore count, but counts rise rapidly when the rainy period ends.  The most important indoor molds are Aspergillus and Penicillium.  Dark, humid and poorly ventilated basements are ideal sites for mold growth.  The next most common sites of mold growth are the bathroom and the kitchen.  Outdoor [employer] Use air conditioning and keep windows closed Avoid exposure to decaying vegetation. Avoid leaf raking. Avoid grain handling. Consider wearing a face mask if working in moldy areas.  Indoor Mold Control Maintain humidity below 50%. Clean washable surfaces with 5% bleach solution. Remove sources e.g. Contaminated carpets  Reducing Pollen Exposure The American Academy of Allergy, Asthma and Immunology suggests the following steps to reduce your exposure to pollen during allergy seasons. Do not hang sheets or clothing out to dry; pollen may collect on these items. Do not mow lawns or spend time around freshly cut grass; mowing stirs up pollen. Keep windows closed at night.  Keep car windows closed while driving. Minimize morning activities outdoors, a time when pollen counts are usually at their highest. Stay indoors as much as possible when pollen counts or humidity is high and on windy days when pollen tends to remain in the air longer. Use air conditioning when possible.  Many air conditioners have filters that trap the pollen spores. Use a HEPA room air filter to remove pollen form the indoor air you breathe.

## 2023-01-22 NOTE — Telephone Encounter (Signed)
I called patient's phone number. I was unable to reach her. I left a detailed message stating that a copy of her breztri [place] & [place] form has been placed to be mailed out to the address on file.

## 2023-01-22 NOTE — Progress Notes (Unsigned)
NEW PATIENT  Date of Service/Encounter:  01/22/23  Consult requested by: Pincus Sanes, MD   Assessment:   No diagnosis found.  Plan/Recommendations:    There are no Patient Instructions on file for this visit.   {Blank single:19197::"This note in its entirety was forwarded to the Provider who requested this consultation."}  Subjective:   [name] is a 52 y.o. female presenting today for evaluation of  Chief Complaint  Patient presents with   Allergic Rhinitis     Has been dealing with allergies for years. Has to be on prednisone frequently.     [name] has a history of the following: Patient Active Problem List   Diagnosis Date Noted   Temporomandibular joint (TMJ) pain 11/28/2022   Urinary frequency 06/28/2021   Change in stool 06/28/2021   Perianal irritation 06/28/2021   Adenomatous polyp of transverse colon    On home oxygen therapy 03/27/2021   Genital herpes 04/11/2020   History of nephrolithiasis 10/13/2019   Anxiety 02/05/2018   Peroneal tendinitis of lower leg, right 10/22/2017   Chronic respiratory failure with hypoxia 07/20/2017   Knee pain, left 02/02/2016   Cough variant asthma 03/09/2015   Diabetes 12/09/2012   Dyslipidemia    Acute on chronic respiratory failure with hypoxia 04/05/2012   Allergic rhinitis 01/05/2010   Essential hypertension 09/07/2009   URINARY INCONTINENCE, URGE 03/24/2009   CHRONIC RHINITIS 12/23/2008   Morbid obesity due to excess calories 10/10/2007   INTERSTITIAL LUNG DISEASE 10/10/2007   GASTROESOPHAGEAL REFLUX DISEASE 10/10/2007    History obtained from: chart review and patient.  [name] was referred by Pincus Sanes, MD.     [name] is a 52 y.o. female presenting for an evaluation of environmental allergies .  She works with Company [employer]. She works with [employer]. She is disabled so this is the way that she is giving back to her community. She is an Health visitor at  [employer].    Asthma/Respiratory Symptom History: She has a history of interstitial lung disease. She is unsure of the trigger. She has never smoked and never had a problem.  She is now followed by Dr. Sherene Sires. Her working diagnosis is idiopathic pulmonary fibrosis. She sees him every 3 months.  She is currently on chronic prednisone. She also has an albuterol inhaler.  She is supposed to be on Symbicort, but it was too expensive. Her worst times of the year are the change in the weather and the spring time. She is currently on prednisone 5 mg daily, but she increases often during periods of respiratory flares.   She has noticed that when she starts feeling bad in her lungs, she has to increase her prednisone which messes up her blood sugars. They are trying to work on her hemoglobin A1c. She has had COVID twice and this has messed things up.   Allergic Rhinitis Symptom History: She has had allergies for her entire life. This is just worse, however. She has not been on antihistamines in anticipation for this visit.  She never been tested for allergies. She has had symptoms since she was a child. She is on Claritin at night. She alternates between antihistamines. She has Flonase on board as well. But this combination does not seem to fully control her symptoms.    {Blank single:19197::"Food Allergy Symptom History: ***"," "}  {Blank single:19197::"Skin Symptom History: ***"," "}  GERD Symptom History: She does have some GERD from coughing.   ***  Otherwise, there is no history of other atopic diseases, including {Blank multiple:19196:o:"asthma","food allergies","drug allergies","environmental allergies","stinging insect allergies","eczema","urticaria","contact dermatitis"}. There is no significant infectious history. ***Vaccinations are up to date.    Past Medical History: Patient Active Problem List   Diagnosis Date Noted   Temporomandibular joint (TMJ) pain 11/28/2022   Urinary frequency  06/28/2021   Change in stool 06/28/2021   Perianal irritation 06/28/2021   Adenomatous polyp of transverse colon    On home oxygen therapy 03/27/2021   Genital herpes 04/11/2020   History of nephrolithiasis 10/13/2019   Anxiety 02/05/2018   Peroneal tendinitis of lower leg, right 10/22/2017   Chronic respiratory failure with hypoxia 07/20/2017   Knee pain, left 02/02/2016   Cough variant asthma 03/09/2015   Diabetes 12/09/2012   Dyslipidemia    Acute on chronic respiratory failure with hypoxia 04/05/2012   Allergic rhinitis 01/05/2010   Essential hypertension 09/07/2009   URINARY INCONTINENCE, URGE 03/24/2009   CHRONIC RHINITIS 12/23/2008   Morbid obesity due to excess calories 10/10/2007   INTERSTITIAL LUNG DISEASE 10/10/2007   GASTROESOPHAGEAL REFLUX DISEASE 10/10/2007    Medication List:  Allergies as of 01/22/2023       Reactions   Penicillins Shortness Of Breath, Swelling, Other (See Comments)   Has patient had a PCN reaction causing immediate rash, facial/tongue/throat swelling, SOB or lightheadedness with hypotension: yes Has patient had a PCN reaction causing severe rash involving mucus membranes or skin necrosis: no Has patient had a PCN reaction that required hospitalization no Has patient had a PCN reaction occurring within the last 10 years: no If all of the above answers are "NO", then may proceed with Cephalosporin use.   Metformin And Related Other (See Comments)   diarrhea   Peach Flavor Hives, Other (See Comments)   Fresh peaches only   Levaquin [levofloxacin] Nausea Only   Nausea, bloating        Medication List        Accurate as of January 22, 2023  9:25 AM. If you have any questions, ask your nurse or doctor.          Accu-Chek Aviva Plus test strip Generic drug: glucose blood USE TO CHECK FOR BLOOD SUGAR TWO TIMES A DAY   Accu-Chek Aviva Plus w/Device Kit Use to check blood sugars twice a day   Accu-Chek Softclix Lancets lancets Use to  check blood sugars twice a day   albuterol 108 (90 Base) MCG/ACT inhaler Commonly known as: VENTOLIN HFA Inhale 2 puffs into the lungs every 4 (four) hours as needed for wheezing or shortness of breath.   aspirin EC 81 MG tablet Take 81 mg by mouth daily. Swallow whole.   atorvastatin 20 MG tablet Commonly known as: LIPITOR TAKE 1 TABLET BY MOUTH DAILY   B COMPLEX VITAMINS SL Place 1 mL under the tongue 4 (four) times a week.   calcium-vitamin D 500-200 MG-UNIT tablet Commonly known as: OSCAL WITH D Take 1 tablet by mouth daily.   celecoxib 200 MG capsule Commonly known as: CELEBREX TAKE ONE CAPSULE BY MOUTH TWICE A DAY   ELDERBERRY PO Take 1,000 mg by mouth daily.   famotidine 40 MG tablet Commonly known as: PEPCID TAKE ONE TABLET BY MOUTH DAILY   fluticasone 50 MCG/ACT nasal spray Commonly known as: FLONASE Place 2 sprays into both nostrils daily.   Flutter Devi Use as directed   Jardiance 25 MG Tabs tablet Generic drug: empagliflozin TAKE ONE TABLET BY MOUTH EVERY MORNING WITH OR WITHOUT  FOOD   loratadine 10 MG tablet Commonly known as: CLARITIN Take 10 mg by mouth at bedtime.   losartan 25 MG tablet Commonly known as: COZAAR TAKE 1/2 TABLET BY MOUTH DAILY   meloxicam 15 MG tablet Commonly known as: MOBIC Take 1 tablet (15 mg total) by mouth daily.   methocarbamol 500 MG tablet Commonly known as: ROBAXIN Take 1 tablet (500 mg total) by mouth 2 (two) times daily as needed for muscle spasms.   mometasone-formoterol 100-5 MCG/ACT Aero Commonly known as: DULERA Inhale 1 puff into the lungs 2 (two) times daily.   montelukast 10 MG tablet Commonly known as: SINGULAIR Take 1 tablet (10 mg total) by mouth at bedtime.   Mounjaro 5 MG/0.5ML Pen Generic drug: tirzepatide INJECT 5 MG UNDER THE SKIN ONCE WEEKLY   Mucinex DM Maximum Strength 60-1200 MG Tb12 Take 1 tablet by mouth every 12 (twelve) hours as needed (with flutter valve).   OXYGEN 2lpm with  sleep and 3lpm with exertion   pantoprazole 40 MG tablet Commonly known as: PROTONIX TAKE 1 TABLET BY MOUTH TWICE A DAY BEFORE MEALS   predniSONE 5 MG tablet Commonly known as: DELTASONE TAKE ONE TABLET BY MOUTH DAILY   repaglinide 0.5 MG tablet Commonly known as: PRANDIN TAKE TWO TABLETS BY MOUTH THREE TIMES A DAY BEFORE A MEAL   sucralfate 1 g tablet Commonly known as: CARAFATE TAKE ONE TABLET BY MOUTH FOUR TIMES A DAY WITH MEALS AND AT BEDTIME   Turmeric 500 MG Caps Take 500 mg by mouth daily.   venlafaxine XR 37.5 MG 24 hr capsule Commonly known as: EFFEXOR-XR TAKE ONE CAPSULE BY MOUTH DAILY WITH BREAKFAST   Vitamin D3 50 MCG (2000 UT) capsule Take 1 capsule (2,000 Units total) by mouth daily.   Vitron-C 65-125 MG Tabs Generic drug: Iron-Vitamin C Take 1 tablet by mouth daily.        Birth History: {Blank single:19197::"non-contributory","born premature and spent time in the NICU","born at term without complications"}  Developmental History: Jocelynn has met all milestones on time. She has required no {Blank multiple:19196:a:"speech therapy","occupational therapy","physical therapy"}. ***non-contributory  Past Surgical History: Past Surgical History:  Procedure Laterality Date   ABDOMINAL HYSTERECTOMY     BILATERAL VATS ABLATION  02/15/04   BIOPSY  04/17/2021   Procedure: BIOPSY;  Surgeon: Tressia Danas, MD;  Location: [facility];  Service: Gastroenterology;;   CESAREAN SECTION     COLONOSCOPY WITH PROPOFOL N/A 04/17/2021   Procedure: COLONOSCOPY WITH PROPOFOL;  Surgeon: Tressia Danas, MD;  Location: [facility];  Service: Gastroenterology;  Laterality: N/A;   KNEE ARTHROSCOPY WITH MEDIAL MENISECTOMY Right 04/01/2019   Procedure: KNEE ARTHROSCOPY WITH MEDIAL MENISECTOMY;  Surgeon: Bjorn Pippin, MD;  Location: [facility];  Service: Orthopedics;  Laterality: Right;   KNEE ARTHROSCOPY WITH MEDIAL MENISECTOMY Left 01/06/2020   Procedure: KNEE ARTHROSCOPY WITH MEDIAL  MENISECTOMY;  Surgeon: Bjorn Pippin, MD;  Location: [facility];  Service: Orthopedics;  Laterality: Left;   LUNG BIOPSY     RIGHT HEART CATH N/A 01/30/2017   Procedure: Right Heart Cath;  Surgeon: Laurey Morale, MD;  Location: [facility] INVASIVE CV LAB;  Service: Cardiovascular;  Laterality: N/A;     Family History: Family History  Problem Relation Age of Onset   Asthma Mother    Sarcoidosis Mother        dxed by transbrochial bx   Allergies Mother    Heart disease Father    Diabetes Father    Allergies Father  Coronary artery disease Father    Asthma Sister    Cancer Maternal Grandmother        CA of unknown type   Cancer Paternal Grandmother        CA of unknown type   Colon cancer Neg Hx    Esophageal cancer Neg Hx    Rectal cancer Neg Hx    Stomach cancer Neg Hx      Social History: Farheen lives at home with ***.    ROS     Objective:   Blood pressure 116/88, pulse 74, temperature 98 F (36.7 C), resp. rate 16, height 5' 1.5" (1.562 m), weight 246 lb 3 oz (111.7 kg), SpO2 97 %. Body mass index is 45.76 kg/m.     Physical Exam   Diagnostic studies:    Spirometry: results abnormal (FEV1: 0.87/41%, FVC: 1.04/39%, FEV1/FVC: 84%).    Spirometry consistent with possible restrictive disease.  Xopenex nebulizer  treatment given in clinic with significant improvement in FEV1 and FVC per ATS criteria.  Allergy Studies: {Blank single:19197::"none","labs sent instead"," "}    {Blank single:19197::"Allergy testing results were read and interpreted by myself, documented by clinical staff."," "}         Malachi Bonds, MD [facility]

## 2023-01-23 ENCOUNTER — Ambulatory Visit: Payer: Medicare Other | Admitting: Family

## 2023-01-23 ENCOUNTER — Encounter: Payer: Self-pay | Admitting: Allergy & Immunology

## 2023-01-23 ENCOUNTER — Encounter: Payer: Self-pay | Admitting: Family

## 2023-01-23 DIAGNOSIS — J302 Other seasonal allergic rhinitis: Secondary | ICD-10-CM

## 2023-01-23 DIAGNOSIS — J3089 Other allergic rhinitis: Secondary | ICD-10-CM

## 2023-01-23 DIAGNOSIS — H1013 Acute atopic conjunctivitis, bilateral: Secondary | ICD-10-CM

## 2023-01-23 DIAGNOSIS — J84112 Idiopathic pulmonary fibrosis: Secondary | ICD-10-CM

## 2023-01-23 MED ORDER — OLOPATADINE HCL 0.2 % OP SOLN: OPHTHALMIC | 5 refills | Status: AC

## 2023-01-23 MED ORDER — FLUTICASONE PROPIONATE 50 MCG/ACT NA SUSP: NASAL | 5 refills | Status: DC

## 2023-01-23 NOTE — Progress Notes (Signed)
Date of Service/Encounter:  01/23/23  Allergy testing appointment   Initial visit on March 23rd 2024, seen for pathic pulmonary fibrosis and chronic rhinitis by Dr. Dellis Anes.  Please see that note for additional details.  Today reports for allergy diagnostic testing:    DIAGNOSTICS:  Skin Testing: Environmental allergy panel. Skin testing today is positive to [employer] and dust mite. Intradermal skin testing is positive to [place] grass, grass mix, ragweed mix, weed mix, tree mix, major mold mix 1, major mold mix 2, major mold mix 3, and major mold mix 4. Adequate positive and negative controls Results discussed with patient/family.   Airborne Adult Perc - 01/23/23 0938     Time Antigen Placed 4098    Allergen Manufacturer Waynette Buttery    Location Back    Number of Test 57    1. Control-Buffer 50% Glycerol Negative    2. Control-Histamine 1 mg/ml 2+    3. Albumin saline Negative    4. Bahia Negative    5. [place] Negative    6. Johnson 2+    7. [place] Negative    10. Sweet Vernal Negative    11. Timothy Negative    12. Cocklebur Negative    13. Burweed Marshelder Negative    14. Ragweed, short Negative    15. Ragweed, Giant Negative    16. Plantain,  English Negative    17. Lamb's Quarters Negative    18. Sheep Sorrell Negative    19. Rough Pigweed Negative    20. Marsh Elder, Rough Negative    21. Mugwort, Common Negative    22. Ash mix Negative    23. Birch mix Negative    24. Beech American Negative    25. Box, Elder Negative    26. Cedar, red Negative    27. Cottonwood, [place] Negative    28. Elm mix Negative    29. Hickory Negative    30. Maple mix Negative    31. Oak, [place] mix Negative    32. Pecan Pollen Negative    33. Pine mix Negative    34. Sycamore Eastern Negative    35. Walnut, Black Pollen Negative    36. Alternaria alternata Negative    37. Cladosporium Herbarum Negative    38. Aspergillus mix Negative    39. Penicillium mix  Negative    40. Bipolaris sorokiniana (Helminthosporium) Negative    41. Drechslera spicifera (Curvularia) Negative    42. Mucor plumbeus Negative    43. Fusarium moniliforme Negative    44. Aureobasidium pullulans (pullulara) Negative    45. Rhizopus oryzae Negative    46. Botrytis cinera Negative    47. Epicoccum nigrum Negative    48. Phoma betae Negative    49. Candida Albicans Negative    50. Trichophyton mentagrophytes Negative    51. Mite, D Farinae  5,000 AU/ml Negative    52. Mite, D Pteronyssinus  5,000 AU/ml 2+    53. Cat Hair 10,000 BAU/ml Negative    54.  Dog Epithelia Negative    55. Mixed Feathers Negative    56. Horse Epithelia Negative    57. Cockroach, German Negative    58. Mouse Negative    59. Tobacco Leaf Negative             Intradermal - 01/23/23 1013     Time Antigen Placed 1018    Allergen Manufacturer Greer    Location Back    Number of Test 13    Control Negative  [place] 3+    7 Grass 3+    Ragweed mix 2+    Weed mix 3+    Tree mix 3+    Mold 1 2+    Mold 2 2+    Mold 3 2+    Mold 4 2+    Cat Negative    Dog Negative    Cockroach Negative               Allergy testing results were read and interpreted by myself, documented by clinical staff.  Patient provided with copy of allergy testing along with avoidance measures when indicated.   1. Idiopathic pulmonary fibrosis - Lung testing was in the 40% range yesterday, but did improve with the albuterol nebulizer treatment. - [place] yesterday so hopefully we can get you the medication for free.  - Daily controller medication(s): Breztri two puffs twice daily - Prior to physical activity: albuterol 2 puffs 10-15 minutes before physical activity. - Rescue medications: albuterol 4 puffs every 4-6 hours as needed - Asthma control goals:  * Full participation in all desired activities (may need albuterol before activity) * Albuterol use two time or less a week  on average (not counting use with activity) * Cough interfering with sleep two time or less a month * Oral steroids no more than once a year * No hospitalizations  2.  Seasonal and perennial rhinitis/allergic conjunctivitis - skin testing today is positive to grass pollen, ragweed mix, weed mix, tree mix, major mold mix 1, major mold mix 2, major mold mix 3, major mold mix 4, and dust mite with adequate controls. -Copy of skin test given -Avoidance measures as below - Continue to alternate between antihistamines - consider allergy injections when your breathing gets better -start Pataday (olopatadine) 1 drop in each eye once  a day as needed for itchy watery eyes - start Flonase (fluticasone) 1-2 sprays in each nostril once a day as needed for stuffy nose  3. Schedule a follow up appointment in 4-6 weeks or sooner if needed   Control of Dust Mite Allergen Dust mites play a major role in allergic asthma and rhinitis. They occur in environments with high humidity wherever human skin is found. Dust mites absorb humidity from the atmosphere (ie, they do not drink) and feed on organic matter (including shed human and animal skin). Dust mites are a microscopic type of insect that you cannot see with the naked eye. High levels of dust mites have been detected from mattresses, pillows, carpets, upholstered furniture, bed covers, clothes, soft toys and any woven material. The principal allergen of the dust mite is found in its feces. A gram of dust may contain 1,000 mites and 250,000 fecal particles. Mite antigen is easily measured in the air during house cleaning activities. Dust mites do not bite and do not cause harm to humans, other than by triggering allergies/asthma.  Ways to decrease your exposure to dust mites in your home:  1. Encase mattresses, box springs and pillows with a mite-impermeable barrier or cover  2. Wash sheets, blankets and drapes weekly in hot water (130 F) with detergent and  dry them in a dryer on the hot setting.  3. Have the room cleaned frequently with a vacuum cleaner and a damp dust-mop. For carpeting or rugs, vacuuming with a vacuum cleaner equipped with a high-efficiency particulate air (HEPA) filter. The dust mite allergic individual should not be in a room which is being cleaned  and should wait 1 hour after cleaning before going into the room.  4. Do not sleep on upholstered furniture (eg, couches).  5. If possible removing carpeting, upholstered furniture and drapery from the home is ideal. Horizontal blinds should be eliminated in the rooms where the person spends the most time (bedroom, study, television room). Washable vinyl, roller-type shades are optimal.  6. Remove all non-washable stuffed toys from the bedroom. Wash stuffed toys weekly like sheets and blankets above.  7. Reduce indoor humidity to less than 50%. Inexpensive humidity monitors can be purchased at most hardware stores. Do not use a humidifier as can make the problem worse and are not recommended.  Control of Mold Allergen Mold and fungi can grow on a variety of surfaces provided certain temperature and moisture conditions exist.  Outdoor molds grow on plants, decaying vegetation and soil.  The major outdoor mold, Alternaria and Cladosporium, are found in very high numbers during hot and dry conditions.  Generally, a late Summer - Fall peak is seen for common outdoor fungal spores.  Rain will temporarily lower outdoor mold spore count, but counts rise rapidly when the rainy period ends.  The most important indoor molds are Aspergillus and Penicillium.  Dark, humid and poorly ventilated basements are ideal sites for mold growth.  The next most common sites of mold growth are the bathroom and the kitchen.  Outdoor [employer] Use air conditioning and keep windows closed Avoid exposure to decaying vegetation. Avoid leaf raking. Avoid grain handling. Consider wearing a face mask if working  in moldy areas.  Indoor Mold Control Maintain humidity below 50%. Clean washable surfaces with 5% bleach solution. Remove sources e.g. Contaminated carpets  Reducing Pollen Exposure The American Academy of Allergy, Asthma and Immunology suggests the following steps to reduce your exposure to pollen during allergy seasons. Do not hang sheets or clothing out to dry; pollen may collect on these items. Do not mow lawns or spend time around freshly cut grass; mowing stirs up pollen. Keep windows closed at night.  Keep car windows closed while driving. Minimize morning activities outdoors, a time when pollen counts are usually at their highest. Stay indoors as much as possible when pollen counts or humidity is high and on windy days when pollen tends to remain in the air longer. Use air conditioning when possible.  Many air conditioners have filters that trap the pollen spores. Use a HEPA room air filter to remove pollen form the indoor air you breathe.   Nehemiah Settle, FNP [facility]

## 2023-01-23 NOTE — Telephone Encounter (Signed)
[place] faxed back approval for Breztri. Called and left a message for patient to call our office back to inform her of approval. Approval letter has been mailed out to her home with [place]&Me contact information on how to receive her medications as well.

## 2023-01-24 ENCOUNTER — Telehealth: Payer: Self-pay | Admitting: Family

## 2023-01-24 NOTE — Telephone Encounter (Signed)
Patient called stating she had allergy testing done on 01-23-2023 she is still itching, swollen, and red asking for a nurse to call her in reference to these reactions please advise

## 2023-01-24 NOTE — Telephone Encounter (Signed)
Returned phone called and left a message for Cresencia to call the office back.  Amand [phone]

## 2023-01-24 NOTE — Telephone Encounter (Signed)
Patient called back and I informed her she can double up on her antihistamine if she needs to. I also informed her it may take a few days for it to go away.  Craig [phone]

## 2023-01-25 DIAGNOSIS — M722 Plantar fascial fibromatosis: Secondary | ICD-10-CM | POA: Diagnosis not present

## 2023-01-25 DIAGNOSIS — R091 Pleurisy: Secondary | ICD-10-CM | POA: Diagnosis not present

## 2023-02-01 ENCOUNTER — Other Ambulatory Visit: Payer: Self-pay | Admitting: Internal Medicine

## 2023-02-04 ENCOUNTER — Other Ambulatory Visit: Payer: Self-pay | Admitting: Internal Medicine

## 2023-02-05 ENCOUNTER — Other Ambulatory Visit: Payer: Self-pay

## 2023-02-05 ENCOUNTER — Encounter: Payer: Self-pay | Admitting: Internal Medicine

## 2023-02-05 MED ORDER — REPAGLINIDE 0.5 MG PO TABS: ORAL_TABLET | ORAL | 2 refills | Status: DC

## 2023-02-07 ENCOUNTER — Other Ambulatory Visit: Payer: Self-pay | Admitting: Internal Medicine

## 2023-02-11 ENCOUNTER — Other Ambulatory Visit: Payer: Self-pay | Admitting: Internal Medicine

## 2023-02-13 ENCOUNTER — Telehealth: Payer: Self-pay | Admitting: Internal Medicine

## 2023-02-13 NOTE — Telephone Encounter (Signed)
Are you okay with [place] refilling prednisone. Patient last seen in 2022

## 2023-02-13 NOTE — Telephone Encounter (Signed)
Yes but needs ov before next refill

## 2023-02-13 NOTE — Telephone Encounter (Signed)
Pt caling in to get a refill on her predniSONE (DELTASONE) 5 MG tablet [[id number]]  [facility]: [facility] [address]

## 2023-02-14 NOTE — Telephone Encounter (Signed)
Called the pt and there was no answer- LMTCB   When she calls back please schedule ov with MW or APP

## 2023-02-20 ENCOUNTER — Other Ambulatory Visit: Payer: Self-pay | Admitting: Internal Medicine

## 2023-02-21 DIAGNOSIS — J84112 Idiopathic pulmonary fibrosis: Secondary | ICD-10-CM | POA: Diagnosis not present

## 2023-02-21 DIAGNOSIS — J709 Respiratory conditions due to unspecified external agent: Secondary | ICD-10-CM | POA: Diagnosis not present

## 2023-02-24 ENCOUNTER — Other Ambulatory Visit: Payer: Self-pay | Admitting: Internal Medicine

## 2023-02-24 DIAGNOSIS — R091 Pleurisy: Secondary | ICD-10-CM | POA: Diagnosis not present

## 2023-02-24 DIAGNOSIS — M722 Plantar fascial fibromatosis: Secondary | ICD-10-CM | POA: Diagnosis not present

## 2023-02-26 ENCOUNTER — Other Ambulatory Visit: Payer: Self-pay

## 2023-02-26 ENCOUNTER — Ambulatory Visit (INDEPENDENT_AMBULATORY_CARE_PROVIDER_SITE_OTHER): Payer: Medicare Other | Admitting: Allergy & Immunology

## 2023-02-26 ENCOUNTER — Encounter: Payer: Self-pay | Admitting: Allergy & Immunology

## 2023-02-26 VITALS — BP 130/72 | HR 89 | Temp 98.0°F | Resp 18

## 2023-02-26 DIAGNOSIS — W57XXXD Bitten or stung by nonvenomous insect and other nonvenomous arthropods, subsequent encounter: Secondary | ICD-10-CM

## 2023-02-26 DIAGNOSIS — J3089 Other allergic rhinitis: Secondary | ICD-10-CM | POA: Diagnosis not present

## 2023-02-26 DIAGNOSIS — Z91038 Other insect allergy status: Secondary | ICD-10-CM

## 2023-02-26 DIAGNOSIS — J84112 Idiopathic pulmonary fibrosis: Secondary | ICD-10-CM

## 2023-02-26 DIAGNOSIS — J302 Other seasonal allergic rhinitis: Secondary | ICD-10-CM | POA: Diagnosis not present

## 2023-02-26 DIAGNOSIS — J310 Chronic rhinitis: Secondary | ICD-10-CM

## 2023-02-26 MED ORDER — CLOBETASOL PROPIONATE 0.05 % EX OINT: 1.0000 | TOPICAL_OINTMENT | Freq: Two times a day (BID) | CUTANEOUS | 5 refills | Status: DC

## 2023-02-26 NOTE — Progress Notes (Signed)
FOLLOW UP  Date of Service/Encounter:  02/26/23   Assessment:   Idiopathic pulmonary fibrosis - with improved lung function today (maintained on prednisone 5 mg daily)  Doing better clinically on Breztri, therefore likely some component of asthma   41 seasonal allergic rhinitis (dust mites, grasses, ragweed, weeds, trees, and indoor and outdoor molds) - well controlled with medications alone   GERD - on PPO   On disability  Plan/Recommendations:   1. Idiopathic pulmonary fibrosis - Lung testing was in the 50% range today which is better than the last time we saw you.  - I will route this note to Dr. Sherene Sires to keep him in the loop.   - Daily controller medication(s): Breztri two puffs twice daily - Prior to physical activity: albuterol 2 puffs 10-15 minutes before physical activity. - Rescue medications: albuterol 4 puffs every 4-6 hours as needed - Asthma control goals:  * Full participation in all desired activities (may need albuterol before activity) * Albuterol use two time or less a week on average (not counting use with activity) * Cough interfering with sleep two time or less a month * Oral steroids no more than once a year * No hospitalizations  2.  Seasonal and perennial rhinitis/allergic conjunctivitis (dust mites, grasses, ragweed, weeds, trees, and indoor and outdoor molds) - Continue with cetirizine 10mg  daily.  - We could always do allergy shots, but your allergic rhinitis symptoms seem fairly stable.   3. Large local reactions to insects  - Add on clobetasol ointment to use 3 times daily for a few days to see if this helps at all to limit the inflammation of the lesions when you get them. - Use for 3 - 5 days maximum to see if this can limit the inflammation.   3. Return in about 6 months (around 08/29/2023). You can have the follow up appointment with Dr. Dellis Anes or a Nurse Practicioner (our Nurse Practitioners are excellent and always have Physician  oversight!).      Subjective:   [name] is a 52 y.o. female presenting today for follow up of  Chief Complaint  Patient presents with   Allergic Rhinitis    Asthma    [name] has a history of the following: Patient Active Problem List   Diagnosis Date Noted   Temporomandibular joint (TMJ) pain 11/28/2022   Urinary frequency 06/28/2021   Change in stool 06/28/2021   Perianal irritation 06/28/2021   Adenomatous polyp of transverse colon    On home oxygen therapy 03/27/2021   Genital herpes 04/11/2020   History of nephrolithiasis 10/13/2019   Anxiety 02/05/2018   Peroneal tendinitis of lower leg, right 10/22/2017   Chronic respiratory failure with hypoxia (HCC) 07/20/2017   Knee pain, left 02/02/2016   Cough variant asthma 03/09/2015   Diabetes (HCC) 12/09/2012   Dyslipidemia    Acute on chronic respiratory failure with hypoxia (HCC) 04/05/2012   Allergic rhinitis 01/05/2010   Essential hypertension 09/07/2009   URINARY INCONTINENCE, URGE 03/24/2009   CHRONIC RHINITIS 12/23/2008   Morbid obesity due to excess calories (HCC) 10/10/2007   INTERSTITIAL LUNG DISEASE 10/10/2007   GASTROESOPHAGEAL REFLUX DISEASE 10/10/2007    History obtained from: chart review and patient.  [name] is a 51 y.o. female presenting for a follow up visit.  She was last seen in April 2024 as a new patient.  At that time, her lung testing was in the 40% range but improved with her albuterol treatment.  We  gave her a sample of Breztri to see if she likes this.  For her rhinitis, we had her back the following day for skin testing due to her health insurance which prioritizes revenue over patient care.  She did follow-up and seen [name] for allergy testing.  She was positive to dust mites, grasses, ragweed, weeds, trees, and indoor and outdoor molds at that time.  Since last visit, she has done well.  Asthma/Respiratory Symptom History: She did have one episode where she had some  issues with a high grade driveway. She needed her albuterol then, but otherwise she has done very well.  This episode was in [place]. Her son and daughter-in-law moved there to a house with a steep incline. She was taking her luggage into the house and she was having issues. She did not need to go to the hospital. Otherwise she has done very well.   She has an appointment with Dr. Sherene Sires this week. She remains on prednisone 5 mg daily. She has been out of it for a couple of days.   Allergic Rhinitis Symptom History: She still has some lesions on her left arm from the intradermal testing. She had a intense large local delayed swelling from her intradermal testing. She has mosquito and ant bites that cause the same reactions.  She did call us back and we told her to take BID allergy medications to get things under control. It took around one week to get this all improved. She just uses some Benadryl. t  Otherwise, there have been no changes to her past medical history, surgical history, family history, or social history.    Review of Systems  Constitutional: Negative.  Negative for chills, fever, malaise/fatigue and weight loss.  HENT: Negative.  Negative for congestion, ear discharge, ear pain and sinus pain.   Eyes:  Negative for pain, discharge and redness.  Respiratory:  Positive for cough. Negative for sputum production, shortness of breath and wheezing.   Cardiovascular: Negative.  Negative for chest pain and palpitations.  Gastrointestinal:  Negative for abdominal pain, constipation, diarrhea, heartburn, nausea and vomiting.  Skin: Negative.  Negative for itching and rash.  Neurological:  Negative for dizziness and headaches.  Endo/Heme/Allergies:  Negative for environmental allergies. Does not bruise/bleed easily.       Objective:   Blood pressure 130/72, pulse 89, temperature 98 F (36.7 C), temperature source Temporal, resp. rate 18, SpO2 96 %. There is no height or weight on  file to calculate BMI.    Physical Exam Vitals reviewed.  Constitutional:      Appearance: She is well-developed.  HENT:     Head: Normocephalic and atraumatic.     Right Ear: Tympanic membrane, ear canal and external ear normal. No drainage, swelling or tenderness. Tympanic membrane is not injected, scarred, erythematous, retracted or bulging.     Left Ear: Tympanic membrane, ear canal and external ear normal. No drainage, swelling or tenderness. Tympanic membrane is not injected, scarred, erythematous, retracted or bulging.     Nose: No nasal deformity, septal deviation, mucosal edema or rhinorrhea.     Right Turbinates: Enlarged. Not swollen or pale.     Left Turbinates: Enlarged. Not swollen.     Right Sinus: No maxillary sinus tenderness or frontal sinus tenderness.     Left Sinus: No maxillary sinus tenderness or frontal sinus tenderness.     Mouth/Throat:     Mouth: Mucous membranes are not pale and not dry.     Pharynx:  Uvula midline.  Eyes:     General:        Right eye: No discharge.        Left eye: No discharge.     Conjunctiva/sclera: Conjunctivae normal.     Right eye: Right conjunctiva is not injected. No chemosis.    Left eye: Left conjunctiva is not injected. No chemosis.    Pupils: Pupils are equal, round, and reactive to light.  Cardiovascular:     Rate and Rhythm: Normal rate and regular rhythm.     Heart sounds: Normal heart sounds.  Pulmonary:     Effort: Pulmonary effort is normal. No tachypnea, accessory muscle usage or respiratory distress.     Breath sounds: Examination of the right-lower field reveals wheezing. Examination of the left-lower field reveals wheezing. Wheezing present. No rhonchi or rales.     Comments: Crackles at the bilateral bases. Moving air better today than previous visits.   Chest:     Chest wall: No tenderness.  Abdominal:     Tenderness: There is no abdominal tenderness. There is no guarding or rebound.  Lymphadenopathy:      Head:     Right side of head: No submandibular, tonsillar or occipital adenopathy.     Left side of head: No submandibular, tonsillar or occipital adenopathy.     Cervical: No cervical adenopathy.  Skin:    Coloration: Skin is not pale.     Findings: No abrasion, erythema, petechiae or rash. Rash is not papular, urticarial or vesicular.  Neurological:     Mental Status: She is alert.  Psychiatric:        Behavior: Behavior is cooperative.      Diagnostic studies:    Spirometry: results abnormal (FEV1: 1.08/50%, FVC: 1.46/56%, FEV1/FVC: 74%).    Spirometry consistent with possible restrictive disease. This is better than we I first met her.    Allergy Studies: none          Malachi Bonds, MD  [facility]

## 2023-02-26 NOTE — Patient Instructions (Addendum)
1. Idiopathic pulmonary fibrosis - Lung testing was in the 50% range today which is better than the last time we saw you.  - I will route this note to Dr. Sherene Sires to keep him in the loop.   - Daily controller medication(s): Breztri two puffs twice daily - Prior to physical activity: albuterol 2 puffs 10-15 minutes before physical activity. - Rescue medications: albuterol 4 puffs every 4-6 hours as needed - Asthma control goals:  * Full participation in all desired activities (may need albuterol before activity) * Albuterol use two time or less a week on average (not counting use with activity) * Cough interfering with sleep two time or less a month * Oral steroids no more than once a year * No hospitalizations  2.  Seasonal and perennial rhinitis/allergic conjunctivitis (dust mites, grasses, ragweed, weeds, trees, and indoor and outdoor molds) - Continue with cetirizine 10mg  daily.  - We could always do allergy shots, but your allergic rhinitis symptoms seem fairly stable.   3. Large local reactions to insects  - Add on clobetasol ointment to use 3 times daily for a few days to see if this helps at all to limit the inflammation of the lesions when you get them. - Use for 3 - 5 days maximum to see if this can limit the inflammation.   3. Return in about 6 months (around 08/29/2023). You can have the follow up appointment with Dr. Dellis Anes or a Nurse Practicioner (our Nurse Practitioners are excellent and always have Physician oversight!).    Please inform us of any Emergency Department visits, hospitalizations, or changes in symptoms. Call us before going to the ED for breathing or allergy symptoms since we might be able to fit you in for a sick visit. Feel free to contact us anytime with any questions, problems, or concerns.  It was a pleasure to see you again today!  Websites that have reliable patient information: 1. American Academy of Asthma, Allergy, and Immunology: www.aaaai.org 2.  Food Allergy Research and Education (FARE): foodallergy.org 3. Mothers of Asthmatics: http://www.asthmacommunitynetwork.org 4. American College of Allergy, Asthma, and Immunology: www.acaai.org   COVID-19 Vaccine Information can be found at: [email or web address] For questions related to vaccine distribution or appointments, please email [email or web address] or call [phone].   We realize that you might be concerned about having an allergic reaction to the COVID19 vaccines. To help with that concern, WE ARE OFFERING THE COVID19 VACCINES IN OUR OFFICE! Ask the front desk for dates!     "Like" [place] on Facebook and Instagram for our latest updates!      A healthy democracy works best when [employer] participate! Make sure you are registered to vote! If you have moved or changed any of your contact information, you will need to get this updated before voting!  In some cases, you MAY be able to register to vote online: [email or web address]

## 2023-02-28 ENCOUNTER — Encounter: Payer: Self-pay | Admitting: Internal Medicine

## 2023-02-28 NOTE — Progress Notes (Signed)
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  Since then waxing/waning sx requiring intermittent prednisone tapers with only minimal improvement - most of her symptoms chronically have been related to obesity with dyspnea on exertion and tendency to chronic cough which is felt to be at least partly  reflux related but improved with saba 03/09/2015 so started on dulera 100 2bid in addition to maint pred and seems to have helped but not typically  able to get < 10 mg pred s flare cough/sob     History of Present Illness  November 07, 2010 ov cc cough/ strangling  a bit more with taper off reglan (actually worse before ran out of the low dose reglan)  no excess mucus or increase sob on prednisone @  10 mg daily .  Using med calendar well rec 1)  Ok to resume water aerobics but pace yourself 2)  Wait until your swallowing evaulation is complete before making a decision re reglan > stopped it on her own.  3)  Try Prednisone 10 mg one-half each am   04/04/2012 f/u ov/[name] no longer using med calendar on Pred 10 mg one daily  added hyzaar to rx for hbp/leg swelling not back to mall walking yet,  No unusual cough, purulent sputum or sinus/hb symptoms on present rx. No variability to symptoms or need for inhaler. Rec Try prednisone 10 mg one half daily to see if affects your ability to walk the mall on 02 or the cough gets worse (walk the mall first for at least before you try) Always walk for exercise on 3lpm - this will help you burn fat and get into negative calorie balance to lose weight.    02/06/2016  f/u ov/[name] re: NSIP / 3lpm with ex/ duler 100 2bid and pred 10 mg daily  Chief Complaint  Patient presents with   Follow-up    Pt states that she did have some issues with allergies but is improving. Pt also was having issues with Dulera,  jittery after use, but has started rinsing her mouth after use and side effects have improved. Pt denies cough/wheeze/SOB/CP/tightness.   limited by L knee from doing treadmill, has trainer but not losing wt  Rhinitis symptoms better on singulair   rec Work on inhaler technique:    Should be able to tolerate dulera 100 Take 2 puffs first thing in am and then another 2 puffs about 12 hours later.  If so, try prednisone 10 - 5  = even days take a half, odd days take half       Knee surgery 04/01/2019 Right knee arthroscopic partial medial meniscectomy and medial femoral and trochlear chondroplasty    12/14/2020  f/u ov/[name] re: NSIP on pred 5 mg daily / cough variant asthma  On dulera 100 2bid  Chief Complaint  Patient presents with   Follow-up    Breathing is overall doing well.   Dyspnea:  Wee step x 30 min on 3lpm sats 97% vs high 80s  Cough: none  Sleeping: bed is flat/ pillows to 45 degrees/ coughs if flat  SABA use: none  02:  Just using 3lpm with ex  Covid status:   X  3  Rec See calendar for specific medication instructions  Keep working on wt loss   In event of flare take prednisone 5 mg x 4  Until better then taper to 1 tablet daily     03/01/2023  f/u ov/[name] re: NSIP/ steroid responsive maint on 5 mg daily  / Breztri Chief Complaint  Patient presents with   Follow-up    Some wheeze and SOB.  Allergist is working with patient on these sx.  Will need refill on her prednisone.  Dyspnea:  2 x weekly walks around park x 15-20 min on 3lpm cont with sats mid 90s Cough: none  Sleeping: flat bed/ bunch of pillows  SABA use: rarely  02: 2lpm hs /  3lpm walking      No obvious day to day or daytime variability or assoc excess/ purulent sputum or mucus plugs or hemoptysis or cp or chest tightness, subjective wheeze or overt sinus or hb symptoms.   sleeping without nocturnal  or early am exacerbation  of respiratory  c/o's or need for noct saba. Also denies any obvious  fluctuation of symptoms with weather or environmental changes or other aggravating or alleviating factors except as outlined above   No unusual exposure hx or h/o childhood pna/ asthma or knowledge of premature birth.  Current Allergies, Complete Past Medical History, Past Surgical History, Family History, and Social History were reviewed in [employer] record.  ROS  The following are not active complaints unless bolded Hoarseness, sore throat, dysphagia, dental problems, itching, sneezing,  nasal congestion or discharge of excess mucus or purulent secretions, ear ache,   fever, chills, sweats, unintended wt loss or wt gain, classically pleuritic or exertional cp,  orthopnea pnd or arm/hand swelling  or leg swelling, presyncope, palpitations, abdominal pain, anorexia, nausea, vomiting, diarrhea  or change in bowel habits or change in bladder habits, change in stools or change in urine, dysuria, hematuria,  rash, arthralgias, visual complaints, headache, numbness, weakness or ataxia or problems with walking or coordination,  change in mood or  memory.        Current Meds  Medication Sig   Accu-Chek Softclix Lancets lancets Use to check blood sugars twice a day   albuterol (PROVENTIL) (2.5 MG/3ML) 0.083% nebulizer solution Take 3 mLs (2.5 mg total) by nebulization every 6 (six) hours as needed for wheezing or shortness of breath.   albuterol (VENTOLIN HFA) 108 (90 Base) MCG/ACT inhaler Inhale 2 puffs into the lungs every 4 (four) hours as needed for wheezing or shortness of breath.   aspirin EC 81 MG tablet Take 81 mg by mouth daily. Swallow whole.   atorvastatin (LIPITOR) 20 MG tablet TAKE 1 TABLET BY MOUTH DAILY   B COMPLEX VITAMINS SL Place 1 mL under the tongue 4 (four) times a week.   Blood Glucose Monitoring Suppl (ACCU-CHEK AVIVA PLUS) w/Device KIT Use to check blood sugars twice a day   Budeson-Glycopyrrol-Formoterol (BREZTRI AEROSPHERE) 160-9-4.8 MCG/ACT AERO Inhale 2  puffs into the lungs in the morning and at bedtime.   calcium-vitamin D (OSCAL WITH D) 500-200 MG-UNIT tablet Take 1 tablet by mouth daily.   celecoxib (CELEBREX) 200 MG capsule TAKE ONE CAPSULE BY MOUTH TWICE A DAY   Cholecalciferol (VITAMIN D3) 50 MCG (2000 UT) capsule Take 1 capsule (2,000 Units total) by mouth daily.   clobetasol ointment (TEMOVATE) 0.05 % Apply 1 Application topically 2 (two) times daily. Use for 3-5 days max per time.   Dextromethorphan-guaiFENesin (MUCINEX DM MAXIMUM STRENGTH) 60-1200 MG TB12 Take 1 tablet by mouth  every 12 (twelve) hours as needed (with flutter valve).   ELDERBERRY PO Take 1,000 mg by mouth daily.   fluticasone (FLONASE) 50 MCG/ACT nasal spray Place 1 to 2 sprays in each nostril once a day as needed for stuffy nose. In the right nostril, point the applicator out toward the right ear. In the left nostril, point the applicator out toward the left ear   glucose blood (ACCU-CHEK AVIVA PLUS) test strip USE TO CHECK FOR BLOOD SUGAR TWO TIMES A DAY   JARDIANCE 25 MG TABS tablet TAKE 1 TABLET BY MOUTH EVERY MORNING WITH OR WITHOUT FOOD   loratadine (CLARITIN) 10 MG tablet Take 10 mg by mouth at bedtime.   losartan (COZAAR) 25 MG tablet TAKE 1/2 TABLET BY MOUTH DAILY   MOUNJARO 5 MG/0.5ML Pen INJECT 5 MG UNDER THE SKIN ONCE WEEKLY   Olopatadine HCl 0.2 % SOLN Place 1 drop in each eye once a day as needed for itchy watery eyes   OXYGEN 2lpm with sleep and 3lpm with exertion   pantoprazole (PROTONIX) 40 MG tablet TAKE 1 TABLET BY MOUTH TWICE A DAY BEFORE MEALS   predniSONE (DELTASONE) 5 MG tablet TAKE ONE TABLET BY MOUTH DAILY   repaglinide (PRANDIN) 0.5 MG tablet TAKE TWO TABLETS BY MOUTH THREE TIMES A DAY BEFORE A MEAL   Turmeric 500 MG CAPS Take 500 mg by mouth daily.   venlafaxine XR (EFFEXOR-XR) 37.5 MG 24 hr capsule TAKE ONE CAPSULE BY MOUTH DAILY WITH BREAKFAST   VITRON-C 65-125 MG TABS Take 1 tablet by mouth daily.                            Past  Medical History: GASTROESOPHAGEAL REFLUX DISEASE     - Tapered reglan to 10 mg one half twice daily June 22, 2010 > d/c'd 11/2010  -restarted reglan 03/2011 >>unable to tolerate.  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)   -Restarted Prednisone 02/24/08   -Desats with > 2 laps 06/22/10 so rec wear 02 2lpm at bedtime and with ex  MORBID OBESITY   - Target wt  =  153  for BMI < 30  Hypertension Health Maintenance...........................................................  Burns     - Pneumovax July 22, 2009             Objective:   Physical Exam   03/01/2023  245  12/14/2020   244 06/16/2020   246 12/08/2019     238  06/09/2019     245  wt  305 May 28, 2008 >   302 November 07, 2010 >   303 03/22/2011 > 10/25/2011  299 > 311 04/04/2012 > 306 03/17/2013 >06/15/2013 303 > 10/06/2013 298  > 02/08/2014 289 > 10/06/2014   288 > 03/09/2015 283 > 10/21/2015    283 >  02/06/2016  284 > 06/01/2016  282 > 08/31/2016   281 > 07/12/2017   270 > 11/26/2017   263> 06/30/2018   252 > 12/04/2018  244    Vital signs reviewed  03/01/2023  - Note at rest 02 sats  97% on RA   General appearance:    pleasant amb MO (by bmi)  bf nad    HEENT : Oropharynx  clear       NECK :  without  apparent JVD/ palpable Nodes/TM    LUNGS: no acc muscle use,  Nl contour chest which is clear to A and P  bilaterally without cough on insp or exp maneuvers   CV:  RRR  no s3 or murmur or increase in P2, and no edema   ABD:  soft and nontender with nl inspiratory excursion in the supine position. No bruits or organomegaly appreciated   MS:  Nl gait/ ext warm without deformities Or obvious joint restrictions  calf tenderness, cyanosis or clubbing    SKIN: warm and dry without lesions    NEURO:  alert, approp, nl sensorium with  no motor or cerebellar deficits apparent.         Assessment & Plan:

## 2023-02-28 NOTE — Patient Instructions (Addendum)
      Blood work was ordered.   The lab is on the first floor.    Medications changes include :   None       Return in about 3 months (around 06/01/2023) for follow up.

## 2023-02-28 NOTE — Progress Notes (Signed)
Subjective:    Patient ID: [name], female    DOB: [birth year], 52 y.o.   MRN: [id number]     HPI Linea is here for follow up of her chronic medical problems.  Was off mounjaro x 1 month due to supply.    Medications and allergies reviewed with patient and updated if appropriate.  Current Outpatient Medications on File Prior to Visit  Medication Sig Dispense Refill   Accu-Chek Softclix Lancets lancets Use to check blood sugars twice a day 100 each 12   albuterol (PROVENTIL) (2.5 MG/3ML) 0.083% nebulizer solution Take 3 mLs (2.5 mg total) by nebulization every 6 (six) hours as needed for wheezing or shortness of breath. 150 mL 2   albuterol (VENTOLIN HFA) 108 (90 Base) MCG/ACT inhaler Inhale 2 puffs into the lungs every 4 (four) hours as needed for wheezing or shortness of breath. 1 each 0   aspirin EC 81 MG tablet Take 81 mg by mouth daily. Swallow whole.     atorvastatin (LIPITOR) 20 MG tablet TAKE 1 TABLET BY MOUTH DAILY 90 tablet 1   B COMPLEX VITAMINS SL Place 1 mL under the tongue 4 (four) times a week.     Blood Glucose Monitoring Suppl (ACCU-CHEK AVIVA PLUS) w/Device KIT Use to check blood sugars twice a day 1 kit 0   Budeson-Glycopyrrol-Formoterol (BREZTRI AEROSPHERE) 160-9-4.8 MCG/ACT AERO Inhale 2 puffs into the lungs in the morning and at bedtime. 10.7 g 12   calcium-vitamin D (OSCAL WITH D) 500-200 MG-UNIT tablet Take 1 tablet by mouth daily.     celecoxib (CELEBREX) 200 MG capsule TAKE ONE CAPSULE BY MOUTH TWICE A DAY 60 capsule 5   Cholecalciferol (VITAMIN D3) 50 MCG (2000 UT) capsule Take 1 capsule (2,000 Units total) by mouth daily. 100 capsule 3   clobetasol ointment (TEMOVATE) 0.05 % Apply 1 Application topically 2 (two) times daily. Use for 3-5 days max per time. 30 g 5   Dextromethorphan-guaiFENesin (MUCINEX DM MAXIMUM STRENGTH) 60-1200 MG TB12 Take 1 tablet by mouth every 12 (twelve) hours as needed (with flutter valve).     ELDERBERRY PO Take 1,000 mg by  mouth daily.     fluticasone (FLONASE) 50 MCG/ACT nasal spray Place 1 to 2 sprays in each nostril once a day as needed for stuffy nose. In the right nostril, point the applicator out toward the right ear. In the left nostril, point the applicator out toward the left ear 16 g 5   glucose blood (ACCU-CHEK AVIVA PLUS) test strip USE TO CHECK FOR BLOOD SUGAR TWO TIMES A DAY 100 strip 0   JARDIANCE 25 MG TABS tablet TAKE 1 TABLET BY MOUTH EVERY MORNING WITH OR WITHOUT FOOD 90 tablet 0   loratadine (CLARITIN) 10 MG tablet Take 10 mg by mouth at bedtime.     losartan (COZAAR) 25 MG tablet TAKE 1/2 TABLET BY MOUTH DAILY 45 tablet 1   MOUNJARO 5 MG/0.5ML Pen INJECT 5 MG UNDER THE SKIN ONCE WEEKLY 2 mL 0   Olopatadine HCl 0.2 % SOLN Place 1 drop in each eye once a day as needed for itchy watery eyes 2.5 mL 5   OXYGEN 2lpm with sleep and 3lpm with exertion     pantoprazole (PROTONIX) 40 MG tablet TAKE 1 TABLET BY MOUTH TWICE A DAY BEFORE MEALS 180 tablet 0   predniSONE (DELTASONE) 5 MG tablet TAKE ONE TABLET BY MOUTH DAILY 90 tablet 0   repaglinide (PRANDIN) 0.5 MG tablet  TAKE TWO TABLETS BY MOUTH THREE TIMES A DAY BEFORE A MEAL 180 tablet 2   Turmeric 500 MG CAPS Take 500 mg by mouth daily.     venlafaxine XR (EFFEXOR-XR) 37.5 MG 24 hr capsule TAKE ONE CAPSULE BY MOUTH DAILY WITH BREAKFAST 90 capsule 2   VITRON-C 65-125 MG TABS Take 1 tablet by mouth daily.     No current facility-administered medications on file prior to visit.     Review of Systems  Constitutional:  Negative for fever.  HENT:  Positive for sneezing.   Respiratory:  Positive for shortness of breath (with strenous exercise only) and wheezing (occ). Negative for cough.   Cardiovascular:  Positive for leg swelling (goes down over night). Negative for chest pain and palpitations.  Musculoskeletal:  Positive for arthralgias.  Neurological:  Negative for light-headedness and headaches.       Objective:   Vitals:   03/01/23 0806   BP: 124/80  Pulse: 80  Temp: 98.3 F (36.8 C)  SpO2: 98%   BP Readings from Last 3 Encounters:  03/01/23 124/80  02/26/23 130/72  01/22/23 116/88   Wt Readings from Last 3 Encounters:  03/01/23 244 lb (110.7 kg)  01/22/23 246 lb 3 oz (111.7 kg)  11/28/22 246 lb (111.6 kg)   Body mass index is 45.36 kg/m.    Physical Exam Constitutional:      General: She is not in acute distress.    Appearance: Normal appearance.  HENT:     Head: Normocephalic and atraumatic.  Eyes:     Conjunctiva/sclera: Conjunctivae normal.  Cardiovascular:     Rate and Rhythm: Normal rate and regular rhythm.     Heart sounds: Normal heart sounds.  Pulmonary:     Effort: Pulmonary effort is normal. No respiratory distress.     Breath sounds: Normal breath sounds. No wheezing.  Musculoskeletal:     Cervical back: Neck supple.     Right lower leg: No edema.     Left lower leg: No edema.  Lymphadenopathy:     Cervical: No cervical adenopathy.  Skin:    General: Skin is warm and dry.     Findings: No rash.  Neurological:     Mental Status: She is alert. Mental status is at baseline.  Psychiatric:        Mood and Affect: Mood normal.        Behavior: Behavior normal.        Lab Results  Component Value Date   WBC 6.6 06/07/2022   HGB 14.7 06/07/2022   HCT 45.9 06/07/2022   PLT 190.0 06/07/2022   GLUCOSE 137 (H) 11/28/2022   CHOL 177 11/28/2022   TRIG 75.0 11/28/2022   HDL 51.80 11/28/2022   LDLDIRECT 153.1 12/08/2012   LDLCALC 110 (H) 11/28/2022   ALT 25 11/28/2022   AST 25 11/28/2022   NA 138 11/28/2022   K 4.5 11/28/2022   CL 101 11/28/2022   CREATININE 0.64 11/28/2022   BUN 18 11/28/2022   CO2 29 11/28/2022   TSH 1.19 06/07/2022   INR 1.01 01/30/2017   HGBA1C 9.3 (H) 11/28/2022   MICROALBUR <0.7 06/07/2022     Assessment & Plan:    See Problem List for Assessment and Plan of chronic medical problems.

## 2023-03-01 ENCOUNTER — Ambulatory Visit (INDEPENDENT_AMBULATORY_CARE_PROVIDER_SITE_OTHER): Payer: Medicare Other | Admitting: Internal Medicine

## 2023-03-01 ENCOUNTER — Encounter: Payer: Self-pay | Admitting: Internal Medicine

## 2023-03-01 VITALS — BP 124/80 | HR 80 | Temp 98.3°F | Ht 61.5 in | Wt 244.0 lb

## 2023-03-01 VITALS — BP 128/86 | HR 85 | Temp 97.8°F | Ht 60.0 in | Wt 245.6 lb

## 2023-03-01 DIAGNOSIS — Z7985 Long-term (current) use of injectable non-insulin antidiabetic drugs: Secondary | ICD-10-CM

## 2023-03-01 DIAGNOSIS — K219 Gastro-esophageal reflux disease without esophagitis: Secondary | ICD-10-CM | POA: Diagnosis not present

## 2023-03-01 DIAGNOSIS — J8410 Pulmonary fibrosis, unspecified: Secondary | ICD-10-CM

## 2023-03-01 DIAGNOSIS — E785 Hyperlipidemia, unspecified: Secondary | ICD-10-CM | POA: Diagnosis not present

## 2023-03-01 DIAGNOSIS — M25562 Pain in left knee: Secondary | ICD-10-CM

## 2023-03-01 DIAGNOSIS — I10 Essential (primary) hypertension: Secondary | ICD-10-CM | POA: Diagnosis not present

## 2023-03-01 DIAGNOSIS — G8929 Other chronic pain: Secondary | ICD-10-CM

## 2023-03-01 DIAGNOSIS — Z7984 Long term (current) use of oral hypoglycemic drugs: Secondary | ICD-10-CM

## 2023-03-01 DIAGNOSIS — E1165 Type 2 diabetes mellitus with hyperglycemia: Secondary | ICD-10-CM | POA: Diagnosis not present

## 2023-03-01 DIAGNOSIS — J9611 Chronic respiratory failure with hypoxia: Secondary | ICD-10-CM | POA: Diagnosis not present

## 2023-03-01 DIAGNOSIS — F419 Anxiety disorder, unspecified: Secondary | ICD-10-CM

## 2023-03-01 LAB — COMPREHENSIVE METABOLIC PANEL
ALT: 25 U/L (ref 0–35)
AST: 27 U/L (ref 0–37)
Albumin: 4.1 g/dL (ref 3.5–5.2)
Alkaline Phosphatase: 82 U/L (ref 39–117)
BUN: 15 mg/dL (ref 6–23)
CO2: 31 mEq/L (ref 19–32)
Calcium: 9.7 mg/dL (ref 8.4–10.5)
Chloride: 101 mEq/L (ref 96–112)
Creatinine, Ser: 0.68 mg/dL (ref 0.40–1.20)
GFR: 101.19 mL/min (ref >60.00)
Glucose, Bld: 128 mg/dL — ABNORMAL HIGH (ref 70–99)
Potassium: 4 mEq/L (ref 3.5–5.1)
Sodium: 139 mEq/L (ref 135–145)
Total Bilirubin: 0.3 mg/dL (ref 0.2–1.2)
Total Protein: 7.7 g/dL (ref 6.0–8.3)

## 2023-03-01 LAB — LIPID PANEL
Cholesterol: 158 mg/dL (ref 0–200)
HDL: 52.2 mg/dL (ref >39.00)
LDL Cholesterol: 96 mg/dL (ref 0–99)
NonHDL: 106.14
Total CHOL/HDL Ratio: 3
Triglycerides: 50 mg/dL (ref 0.0–149.0)
VLDL: 10 mg/dL (ref 0.0–40.0)

## 2023-03-01 LAB — HEMOGLOBIN A1C: Hgb A1c MFr Bld: 9.3 % — ABNORMAL HIGH (ref 4.6–6.5)

## 2023-03-01 MED ORDER — PREDNISONE 5 MG PO TABS: 5.0000 mg | ORAL_TABLET | Freq: Every day | ORAL | 2 refills | Status: DC

## 2023-03-01 NOTE — Assessment & Plan Note (Signed)
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)   -Restarted Prednisone 02/24/08   -PFT's December 23, 2008 VC 35%  DLC0 31%   -PFT's 02/08/2014   VC 1.30 (49%) and DLCO is 70%  > rec pred 10/5 > did not tolerate - PFTs  03/09/2015     VC 1.27 (48%) and dlco is 72% @ 10 mg daily  - 02/06/2016 try 10 a/w 5 mg daily > did not try - 06/01/2016 try 10 a/w 5 daily > cough  flared p 1 week on lower dose  -ANA positive 10/2016 >refer to rheumatology  -HRCT chest essentially stable changes since 2005 10/09/2016 >  Rheum eval 12/03/16 Haw[facility]/aron Wallace C[facility]  And f/u q 3 mo as of 06/30/2018   - PFTs 12/04/2018  VC = 1.38 (54%)  And  dlco 13.72 =73% with corrects to 6.5 144 % on pred 5 mg daily = no significant change from prior  The goal with a chronic steroid dependent illness is always arriving at the lowest effective dose that controls the disease/symptoms and not accepting a set "formula" which is based on statistics or guidelines that don't always take into account patient  variability or the natural hx of the dz in every individual patient, which may well vary over time.  For now therefore I recommend the patient try new floor of 2.5 mg per day/ ceiling of 20 mg. Warned re what to look for with too low a dose (cough on isnp, worsening ex sats/ nausea).  F/u can be yearly since also seeing allergy now

## 2023-03-01 NOTE — Assessment & Plan Note (Addendum)
Chronic Stressed weight loss  [name] is helping - will titrate up dose as tolerated-next month we will plan to increase to 7.5 mg Discussed regular exercise Stressed decreasing calorie intake-eating low sugar/carbohydrate diet high in protein and vegetables

## 2023-03-01 NOTE — Assessment & Plan Note (Signed)
Chronic Controlled, Stable  feels really good now - hope to come off medication later this year Continue Effexor 37.5 mg daily 

## 2023-03-01 NOTE — Assessment & Plan Note (Signed)
Chronic Blood pressure well controlled CMP Continue losartan 12.5 mg daily 

## 2023-03-01 NOTE — Assessment & Plan Note (Signed)
Chronic °Regular exercise and healthy diet encouraged °Check lipid panel  °Continue atorvastatin 20 mg daily °

## 2023-03-01 NOTE — Assessment & Plan Note (Addendum)
Chronic Secondary to osteoarthritis Stressed weight loss, regular exercise Continue Celebrex 200 mg twice daily as needed - typically takes it once a day

## 2023-03-01 NOTE — Patient Instructions (Signed)
Try prednisone  5 mg  - 2.5  - 5mg  x 2 weeks at least then 2.5 mg daily    Please schedule a follow up visit in 12  months but call sooner if needed

## 2023-03-01 NOTE — Assessment & Plan Note (Addendum)
Chronic   Lab Results  Component Value Date   HGBA1C 9.3 (H) 11/28/2022   Sugars not ideally controlled Testing sugars some days Check A1c She was off Mounjaro for 1 month due to supply issues Continue Jardiance 25 mg daily, Prandin 0.5 mg 3 times daily AC, mounjaro 5 mg weekly  Stressed regular exercise, diabetic diet Stressed the importance of weight loss

## 2023-03-01 NOTE — Assessment & Plan Note (Signed)
On 02 since 2011  As of 03/01/2023  2lpm hs and   prn with activity up to 3lpm   Advised: Make sure you check your oxygen saturation  AT  your highest level of activity (not after you stop)   to be sure it stays over 90% and adjust  02 flow upward to maintain this level if needed but remember to turn it back to previous settings when you stop (to conserve your supply).   F/u yearly          Each maintenance medication was reviewed in detail including emphasizing most importantly the difference between maintenance and prns and under what circumstances the prns are to be triggered using an action plan format where appropriate.  Total time for H and P, chart review, counseling, reviewing hfa/neb/02 device(s) and generating customized AVS unique to this office visit / same day charting = 25 min

## 2023-03-01 NOTE — Assessment & Plan Note (Signed)
Chronic GERD controlled Continue protonix 40 mg bid Hope with weight loss to be able to dec medication

## 2023-03-19 ENCOUNTER — Other Ambulatory Visit: Payer: Self-pay | Admitting: Internal Medicine

## 2023-03-24 DIAGNOSIS — J709 Respiratory conditions due to unspecified external agent: Secondary | ICD-10-CM | POA: Diagnosis not present

## 2023-03-24 DIAGNOSIS — J84112 Idiopathic pulmonary fibrosis: Secondary | ICD-10-CM | POA: Diagnosis not present

## 2023-03-25 ENCOUNTER — Other Ambulatory Visit: Payer: Self-pay | Admitting: Internal Medicine

## 2023-03-27 DIAGNOSIS — R091 Pleurisy: Secondary | ICD-10-CM | POA: Diagnosis not present

## 2023-03-27 DIAGNOSIS — M722 Plantar fascial fibromatosis: Secondary | ICD-10-CM | POA: Diagnosis not present

## 2023-04-14 ENCOUNTER — Encounter: Payer: Self-pay | Admitting: Emergency Medicine

## 2023-04-14 ENCOUNTER — Ambulatory Visit
Admission: EM | Admit: 2023-04-14 | Discharge: 2023-04-14 | Disposition: A | Discharge status: Home / Self Care | Payer: Medicare Other | Attending: Emergency Medicine | Admitting: Emergency Medicine

## 2023-04-14 DIAGNOSIS — U071 COVID-19: Secondary | ICD-10-CM | POA: Diagnosis not present

## 2023-04-14 MED ORDER — PAXLOVID (300/100) 20 X 150 MG & 10 X 100MG PO TBPK: 3.0000 | ORAL_TABLET | Freq: Two times a day (BID) | ORAL | 0 refills | Status: AC

## 2023-04-14 MED ORDER — BENZONATATE 100 MG PO CAPS: 100.0000 mg | ORAL_CAPSULE | Freq: Three times a day (TID) | ORAL | 0 refills | Status: DC

## 2023-04-14 MED ORDER — PREDNISONE 20 MG PO TABS: 40.0000 mg | ORAL_TABLET | Freq: Every day | ORAL | 0 refills | Status: DC

## 2023-04-14 MED ORDER — PROMETHAZINE-DM 6.25-15 MG/5ML PO SYRP: 2.5000 mL | ORAL_SOLUTION | Freq: Every evening | ORAL | 0 refills | Status: DC | PRN

## 2023-04-14 NOTE — ED Triage Notes (Signed)
Pt said she has been on a church conference and started having cough, congestion, sore throat since Friday and today she did a home covid test and was positive. Pt is worried bc of her being a lung patient.

## 2023-04-14 NOTE — ED Provider Notes (Signed)
[name]    CSN: [id number] Arrival date & time: 04/14/23  0807      History   Chief Complaint Chief Complaint  Patient presents with   Cough   Nasal Congestion    HPI [name] is a 52 y.o. female.   Patient presents for evaluation of nasal congestion, rhinorrhea, sore throat and nonproductive cough present for 2 days.  No known sick contact but has been attendance a church conference.  Tolerating food and liquids but decreased appetite.  Has attempted use of Mucinex max and nasal spray.  Denies shortness of breath, wheezing, fever chills or bodyaches.  History of diabetes, sarcoidosis, interstitial lung disease  Past Medical History:  Diagnosis Date   Allergic rhinitis    Arthritis    Knees ankles  feet   Bronchitis    Chronic rhinitis    Cough    Dyslipidemia    Dysphagia    Dyspnea    occationally over last 2 years   Fibromyalgia    GERD (gastroesophageal reflux disease)    Headache(784.0)    Hyperlipidemia    ILD (interstitial lung disease) (HCC)    Mild depression    Morbid obesity (HCC)    Type II or diabetes mellitus, uncontrolled 11/2012 dx   Dx 12/08/12: a1c 10.0   Unspecified essential hypertension    Urge incontinence     Patient Active Problem List   Diagnosis Date Noted   Temporomandibular joint (TMJ) pain 11/28/2022   Urinary frequency 06/28/2021   Change in stool 06/28/2021   Perianal irritation 06/28/2021   Adenomatous polyp of transverse colon    On home oxygen therapy 03/27/2021   Genital herpes 04/11/2020   History of nephrolithiasis 10/13/2019   Anxiety 02/05/2018   Peroneal tendinitis of lower leg, right 10/22/2017   Chronic respiratory failure with hypoxia (HCC) 07/20/2017   Knee pain, left 02/02/2016   Cough variant asthma 03/09/2015   Diabetes (HCC) 12/09/2012   Dyslipidemia    Acute on chronic respiratory failure with hypoxia (HCC) 04/05/2012   Allergic rhinitis 01/05/2010   Essential hypertension 09/07/2009    URINARY INCONTINENCE, URGE 03/24/2009   CHRONIC RHINITIS 12/23/2008   Morbid obesity due to excess calories (HCC) 10/10/2007   INTERSTITIAL LUNG DISEASE 10/10/2007   GASTROESOPHAGEAL REFLUX DISEASE 10/10/2007    Past Surgical History:  Procedure Laterality Date   ABDOMINAL HYSTERECTOMY     BILATERAL VATS ABLATION  02/15/04   BIOPSY  04/17/2021   Procedure: BIOPSY;  Surgeon: Tressia Danas, MD;  Location: [facility];  Service: Gastroenterology;;   CESAREAN SECTION     COLONOSCOPY WITH PROPOFOL N/A 04/17/2021   Procedure: COLONOSCOPY WITH PROPOFOL;  Surgeon: Tressia Danas, MD;  Location: [facility];  Service: Gastroenterology;  Laterality: N/A;   KNEE ARTHROSCOPY WITH MEDIAL MENISECTOMY Right 04/01/2019   Procedure: KNEE ARTHROSCOPY WITH MEDIAL MENISECTOMY;  Surgeon: Bjorn Pippin, MD;  Location: [facility];  Service: Orthopedics;  Laterality: Right;   KNEE ARTHROSCOPY WITH MEDIAL MENISECTOMY Left 01/06/2020   Procedure: KNEE ARTHROSCOPY WITH MEDIAL MENISECTOMY;  Surgeon: Bjorn Pippin, MD;  Location: [facility];  Service: Orthopedics;  Laterality: Left;   LUNG BIOPSY     RIGHT HEART CATH N/A 01/30/2017   Procedure: Right Heart Cath;  Surgeon: Laurey Morale, MD;  Location: [facility];  Service: Cardiovascular;  Laterality: N/A;    OB History   No obstetric history on file.      Home Medications    Prior to  Admission medications   Medication Sig Start Date End Date Taking? Authorizing Provider  Accu-Chek Softclix Lancets lancets Use to check blood sugars twice a day 06/29/20   Pincus Sanes, MD  albuterol (PROVENTIL) (2.5 MG/3ML) 0.083% nebulizer solution Take 3 mLs (2.5 mg total) by nebulization every 6 (six) hours as needed for wheezing or shortness of breath. 01/22/23   Alfonse Spruce, MD  albuterol (VENTOLIN HFA) 108 (90 Base) MCG/ACT inhaler Inhale 2 puffs into the lungs every 4 (four) hours as needed for wheezing or shortness of breath. 01/22/23   Alfonse Spruce, MD  aspirin EC 81 MG tablet Take 81 mg by mouth daily. Swallow whole.    [provider]  atorvastatin (LIPITOR) 20 MG tablet Take 1 tablet (20 mg total) by mouth daily. Annual appt due in Sept must see provider for future refills 03/19/23   Pincus Sanes, MD  B COMPLEX VITAMINS SL Place 1 mL under the tongue 4 (four) times a week.    [provider]  Blood Glucose Monitoring Suppl (ACCU-CHEK AVIVA PLUS) w/Device KIT Use to check blood sugars twice a day 07/18/22   Pincus Sanes, MD  Budeson-Glycopyrrol-Formoterol (BREZTRI AEROSPHERE) 160-9-4.8 MCG/ACT AERO Inhale 2 puffs into the lungs in the morning and at bedtime. 01/22/23   Alfonse Spruce, MD  calcium-vitamin D (OSCAL WITH D) 500-200 MG-UNIT tablet Take 1 tablet by mouth daily.    [provider]  celecoxib (CELEBREX) 200 MG capsule TAKE ONE CAPSULE BY MOUTH TWICE A DAY 10/16/22   Pincus Sanes, MD  Cholecalciferol (VITAMIN D3) 50 MCG (2000 UT) capsule Take 1 capsule (2,000 Units total) by mouth daily. 09/28/20   Plotnikov, Georgina Quint, MD  clobetasol ointment (TEMOVATE) 0.05 % Apply 1 Application topically 2 (two) times daily. Use for 3-5 days max per time. 02/26/23   Alfonse Spruce, MD  Dextromethorphan-guaiFENesin [facility] DM MAXIMUM STRENGTH) 60-1200 MG TB12 Take 1 tablet by mouth every 12 (twelve) hours as needed (with flutter valve).    [provider]  ELDERBERRY PO Take 1,000 mg by mouth daily.    [provider]  fluticasone (FLONASE) 50 MCG/ACT nasal spray Place 1 to 2 sprays in each nostril once a day as needed for stuffy nose. In the right nostril, point the applicator out toward the right ear. In the left nostril, point the applicator out toward the left ear 01/23/23   Nehemiah Settle, FNP  glucose blood (ACCU-CHEK AVIVA PLUS) test strip USE TO CHECK FOR BLOOD SUGAR TWO TIMES A DAY 07/10/21   Burns, Bobette Mo, MD  JARDIANCE 25 MG TABS tablet TAKE 1 TABLET BY MOUTH EVERY  MORNING WITH OR WITHOUT FOOD 02/07/23   Pincus Sanes, MD  loratadine (CLARITIN) 10 MG tablet Take 10 mg by mouth at bedtime.    [provider]  losartan (COZAAR) 25 MG tablet TAKE 1/2 TABLET BY MOUTH DAILY 02/07/23   Pincus Sanes, MD  Olopatadine HCl 0.2 % SOLN Place 1 drop in each eye once a day as needed for itchy watery eyes 01/23/23   Nehemiah Settle, FNP  OXYGEN 2lpm with sleep and 3lpm with exertion    [provider]  pantoprazole (PROTONIX) 40 MG tablet TAKE 1 TABLET BY MOUTH TWICE A DAY BEFORE MEALS 12/19/22   Burns, Bobette Mo, MD  predniSONE (DELTASONE) 5 MG tablet Take 1 tablet (5 mg total) by mouth daily. 03/01/23   Nyoka Cowden, MD  repaglinide (PRANDIN) 0.5  MG tablet TAKE TWO TABLETS BY MOUTH THREE TIMES A DAY BEFORE A MEAL 02/05/23   Burns, Bobette Mo, MD  tirzepatide [facility]) 5 MG/0.5ML Pen Inject 5 mg into the skin once a week. Per MD Return in about 3 months (around 06/01/2023) for follow up. 03/25/23   Pincus Sanes, MD  Turmeric 500 MG CAPS Take 500 mg by mouth daily.    [provider]  venlafaxine XR (EFFEXOR-XR) 37.5 MG 24 hr capsule TAKE ONE CAPSULE BY MOUTH DAILY WITH BREAKFAST 02/01/23   Burns, Bobette Mo, MD  VITRON-C 65-125 MG TABS Take 1 tablet by mouth daily.    [provider]    Family History Family History  Problem Relation Age of Onset   Asthma Mother    Sarcoidosis Mother        dxed by transbrochial bx   Allergies Mother    Heart disease Father    Diabetes Father    Allergies Father    Coronary artery disease Father    Asthma Sister    Cancer Maternal Grandmother        CA of unknown type   Cancer Paternal Grandmother        CA of unknown type   Colon cancer Neg Hx    Esophageal cancer Neg Hx    Rectal cancer Neg Hx    Stomach cancer Neg Hx     Social History Social History   Tobacco Use   Smoking status: Never   Smokeless tobacco: Never   Tobacco comments:    {  Vaping Use   Vaping status: Never Used   Substance Use Topics   Alcohol use: No    Comment: maybe 2 times per year   Drug use: No     Allergies   Penicillins, Metformin and related, Other, Peach flavor, and Levaquin [levofloxacin]   Review of Systems Review of Systems  Constitutional: Negative.   HENT:  Positive for congestion, rhinorrhea and sore throat. Negative for dental problem, drooling, ear discharge, ear pain, facial swelling, hearing loss, mouth sores, nosebleeds, postnasal drip, sinus pressure, sinus pain, sneezing, tinnitus, trouble swallowing and voice change.   Respiratory:  Positive for cough. Negative for apnea, choking, chest tightness, shortness of breath, wheezing and stridor.   Cardiovascular: Negative.   Gastrointestinal: Negative.      Physical Exam Triage Vital Signs ED Triage Vitals  Encounter Vitals Group     BP 04/14/23 0825 (!) 143/96     Systolic BP Percentile --      Diastolic BP Percentile --      Pulse Rate 04/14/23 0825 100     Resp 04/14/23 0824 16     Temp 04/14/23 0825 98.4 F (36.9 C)     Temp Source 04/14/23 0825 Oral     SpO2 04/14/23 0825 95 %     Weight --      Height --      Head Circumference --      Peak Flow --      Pain Score 04/14/23 0820 3     Pain Loc --      Pain Education --      Exclude from Growth Chart --    No data found.  Updated Vital Signs BP (!) 143/96 (BP Location: Left Arm)   Pulse 100   Temp 98.4 F (36.9 C) (Oral)   Resp 16   SpO2 95%   Visual Acuity Right Eye Distance:   Left Eye Distance:  Bilateral Distance:    Right Eye Near:   Left Eye Near:    Bilateral Near:     Physical Exam Constitutional:      Appearance: Normal appearance.  HENT:     Head: Normocephalic.     Right Ear: Tympanic membrane, ear canal and external ear normal.     Left Ear: Tympanic membrane, ear canal and external ear normal.     Nose: Congestion and rhinorrhea present.     Mouth/Throat:     Pharynx: No posterior oropharyngeal erythema.     Tonsils:  2+ on the right. 2+ on the left.  Eyes:     Extraocular Movements: Extraocular movements intact.  Cardiovascular:     Rate and Rhythm: Normal rate and regular rhythm.     Pulses: Normal pulses.     Heart sounds: Normal heart sounds.  Pulmonary:     Effort: Pulmonary effort is normal.     Breath sounds: Normal breath sounds.  Skin:    General: Skin is warm and dry.  Neurological:     General: No focal deficit present.     Mental Status: She is alert and oriented to person, place, and time. Mental status is at baseline.      UC Treatments / Results  Labs (all labs ordered are listed, but only abnormal results are displayed) Labs Reviewed  SARS CORONAVIRUS 2 (TAT 6-24 HRS)    EKG   Radiology No results found.  Procedures Procedures (including critical care time)  Medications Ordered in UC Medications - No data to display  Initial Impression / Assessment and Plan / UC Course  I have reviewed the triage vital signs and the nursing notes.  Pertinent labs & imaging results that were available during my care of the patient were reviewed by me and considered in my medical decision making (see chart for details).  COVID-19  COVID-19 test pending, home test positive, vitals are stable and while ill-appearing patient is in no signs of distress nor toxic, stable for outpatient management, lungs are clear to auscultation therefore imaging deferred, started on Paxlovid, discussed administration, discussed current CDC guidelines for quarantine, additionally prescribed prednisone, Tessalon and Promethazine DM for supportive care, patient told to discontinue atorvastatin for 10 days while using antiviral, may continue use of over-the-counter medications and given strict precautions for any difficulty breathing to return for reevaluation Final Clinical Impressions(s) / UC Diagnoses   Final diagnoses:  COVID-19     Discharge Instructions      Covid 19 is a virus and should  steadily improve in time   Begin Paxlovid which is the antiviral, take every morning and every evening for 5 days, this will not take away her illness but ideally would minimize your symptoms and keep a virus from worsening  Current quarantine guidelines are 24 hours without fever then may return activity, you will need to wear mask until all symptoms have resolved    You can take Tylenol and/or Ibuprofen as needed for fever reduction and pain relief.   For cough: honey 1/2 to 1 teaspoon (you can dilute the honey in water or another fluid).  You can also use guaifenesin and dextromethorphan for cough. You can use a humidifier for chest congestion and cough.  If you don't have a humidifier, you can sit in the bathroom with the hot shower running.      For sore throat: try warm salt water gargles, cepacol lozenges, throat spray, warm tea or water with lemon/honey, popsicles  or ice, or OTC cold relief medicine for throat discomfort.   For congestion: take a daily anti-histamine like Zyrtec, Claritin, and a oral decongestant, such as pseudoephedrine.  You can also use Flonase 1-2 sprays in each nostril daily.   It is important to stay hydrated: drink plenty of fluids (water, gatorade/powerade/pedialyte, juices, or teas) to keep your throat moisturized and help further relieve irritation/discomfort.    ED Prescriptions   None    PDMP not reviewed this encounter.   Valinda Hoar, [place] 04/14/23 [phone]

## 2023-04-14 NOTE — Discharge Instructions (Addendum)
Covid 19 is a virus and should steadily improve in time   Begin Paxlovid which is the antiviral, take every morning and every evening for 5 days, this will not take away her illness but ideally would minimize your symptoms and keep a virus from worsening  While taking Paxlovid please stop your atorvastatin for the next 10 days, may restart on Wednesday, April 24, 2023  Begin prednisone 40 mg every morning for 5 days to open and relax the airway which will help to minimize cough and prevent shortness of breath and wheezing, stop your 5 mg dosage until short course is completed  You may use Tessalon pill every 8 hours as needed for coughing  You may use cough syrup at bedtime for additional comfort, be mindful this will make you feel sleepy  Current quarantine guidelines are 24 hours without fever then may return activity, you will need to wear mask until all symptoms have resolved as far as group outings with friends and families, please notify them that you have COVID prior to attending and the need to make a decision from there    You can take Tylenol and/or Ibuprofen as needed for fever reduction and pain relief.   For cough: honey 1/2 to 1 teaspoon (you can dilute the honey in water or another fluid).  You can also use guaifenesin and dextromethorphan for cough. You can use a humidifier for chest congestion and cough.  If you don't have a humidifier, you can sit in the bathroom with the hot shower running.      For sore throat: try warm salt water gargles, cepacol lozenges, throat spray, warm tea or water with lemon/honey, popsicles or ice, or OTC cold relief medicine for throat discomfort.   For congestion: take a daily anti-histamine like Zyrtec, Claritin, and a oral decongestant, such as pseudoephedrine.  You can also use Flonase 1-2 sprays in each nostril daily.   It is important to stay hydrated: drink plenty of fluids (water, gatorade/powerade/pedialyte, juices, or teas) to keep your  throat moisturized and help further relieve irritation/discomfort.

## 2023-04-15 LAB — SARS CORONAVIRUS 2 (TAT 6-24 HRS): SARS Coronavirus 2: POSITIVE — AB

## 2023-04-23 DIAGNOSIS — J84112 Idiopathic pulmonary fibrosis: Secondary | ICD-10-CM | POA: Diagnosis not present

## 2023-04-23 DIAGNOSIS — J709 Respiratory conditions due to unspecified external agent: Secondary | ICD-10-CM | POA: Diagnosis not present

## 2023-04-26 DIAGNOSIS — R091 Pleurisy: Secondary | ICD-10-CM | POA: Diagnosis not present

## 2023-04-26 DIAGNOSIS — M722 Plantar fascial fibromatosis: Secondary | ICD-10-CM | POA: Diagnosis not present

## 2023-05-01 ENCOUNTER — Encounter (INDEPENDENT_AMBULATORY_CARE_PROVIDER_SITE_OTHER): Payer: Self-pay

## 2023-05-07 ENCOUNTER — Other Ambulatory Visit: Payer: Self-pay

## 2023-05-07 ENCOUNTER — Encounter: Payer: Self-pay | Admitting: Internal Medicine

## 2023-05-07 MED ORDER — TIRZEPATIDE 7.5 MG/0.5ML ~~LOC~~ SOAJ: 7.5000 mg | SUBCUTANEOUS | 0 refills | Status: DC

## 2023-05-24 DIAGNOSIS — J84112 Idiopathic pulmonary fibrosis: Secondary | ICD-10-CM | POA: Diagnosis not present

## 2023-05-24 DIAGNOSIS — J709 Respiratory conditions due to unspecified external agent: Secondary | ICD-10-CM | POA: Diagnosis not present

## 2023-05-27 DIAGNOSIS — M722 Plantar fascial fibromatosis: Secondary | ICD-10-CM | POA: Diagnosis not present

## 2023-05-27 DIAGNOSIS — R091 Pleurisy: Secondary | ICD-10-CM | POA: Diagnosis not present

## 2023-05-29 NOTE — Patient Instructions (Addendum)
      Blood work was ordered.   The lab is on the first floor.    Medications changes include :   none       Return in about 6 months (around 11/29/2023) for Physical Exam.

## 2023-05-29 NOTE — Progress Notes (Unsigned)
Subjective:    Patient ID: [name], female    DOB: 25-Jan-1971, 52 y.o.   MRN: [id number]     HPI [name] is here for follow up of her chronic medical problems.  Had covid mid July. Was on prednisone.  Has brain fog, not sleeping thru the night.  Still has a little cough here and there.  Getting better - slowly.      Medications and allergies reviewed with patient and updated if appropriate.  Current Outpatient Medications on File Prior to Visit  Medication Sig Dispense Refill   Accu-Chek Softclix Lancets lancets Use to check blood sugars twice a day 100 each 12   albuterol (PROVENTIL) (2.5 MG/3ML) 0.083% nebulizer solution Take 3 mLs (2.5 mg total) by nebulization every 6 (six) hours as needed for wheezing or shortness of breath. 150 mL 2   albuterol (VENTOLIN HFA) 108 (90 Base) MCG/ACT inhaler Inhale 2 puffs into the lungs every 4 (four) hours as needed for wheezing or shortness of breath. 1 each 0   aspirin EC 81 MG tablet Take 81 mg by mouth daily. Swallow whole.     atorvastatin (LIPITOR) 20 MG tablet Take 1 tablet (20 mg total) by mouth daily. Annual appt due in Sept must see provider for future refills 90 tablet 0   B COMPLEX VITAMINS SL Place 1 mL under the tongue 4 (four) times a week.     benzonatate (TESSALON) 100 MG capsule Take 1 capsule (100 mg total) by mouth every 8 (eight) hours. 21 capsule 0   Blood Glucose Monitoring Suppl (ACCU-CHEK AVIVA PLUS) w/Device KIT Use to check blood sugars twice a day 1 kit 0   Budeson-Glycopyrrol-Formoterol (BREZTRI AEROSPHERE) 160-9-4.8 MCG/ACT AERO Inhale 2 puffs into the lungs in the morning and at bedtime. 10.7 g 12   calcium-vitamin D (OSCAL WITH D) 500-200 MG-UNIT tablet Take 1 tablet by mouth daily.     celecoxib (CELEBREX) 200 MG capsule TAKE ONE CAPSULE BY MOUTH TWICE A DAY 60 capsule 5   Cholecalciferol (VITAMIN D3) 50 MCG (2000 UT) capsule Take 1 capsule (2,000 Units total) by mouth daily. 100 capsule 3   clobetasol  ointment (TEMOVATE) 0.05 % Apply 1 Application topically 2 (two) times daily. Use for 3-5 days max per time. 30 g 5   Dextromethorphan-guaiFENesin (MUCINEX DM MAXIMUM STRENGTH) 60-1200 MG TB12 Take 1 tablet by mouth every 12 (twelve) hours as needed (with flutter valve).     ELDERBERRY PO Take 1,000 mg by mouth daily.     fluticasone (FLONASE) 50 MCG/ACT nasal spray Place 1 to 2 sprays in each nostril once a day as needed for stuffy nose. In the right nostril, point the applicator out toward the right ear. In the left nostril, point the applicator out toward the left ear 16 g 5   glucose blood (ACCU-CHEK AVIVA PLUS) test strip USE TO CHECK FOR BLOOD SUGAR TWO TIMES A DAY 100 strip 0   JARDIANCE 25 MG TABS tablet TAKE 1 TABLET BY MOUTH EVERY MORNING WITH OR WITHOUT FOOD 90 tablet 0   loratadine (CLARITIN) 10 MG tablet Take 10 mg by mouth at bedtime.     losartan (COZAAR) 25 MG tablet TAKE 1/2 TABLET BY MOUTH DAILY 45 tablet 1   Olopatadine HCl 0.2 % SOLN Place 1 drop in each eye once a day as needed for itchy watery eyes 2.5 mL 5   OXYGEN 2lpm with sleep and 3lpm with exertion  pantoprazole (PROTONIX) 40 MG tablet TAKE 1 TABLET BY MOUTH TWICE A DAY BEFORE MEALS 180 tablet 0   predniSONE (DELTASONE) 20 MG tablet Take 2 tablets (40 mg total) by mouth daily. 10 tablet 0   predniSONE (DELTASONE) 5 MG tablet Take 1 tablet (5 mg total) by mouth daily. 90 tablet 2   promethazine-dextromethorphan (PROMETHAZINE-DM) 6.25-15 MG/5ML syrup Take 2.5 mLs by mouth at bedtime as needed for cough. 118 mL 0   repaglinide (PRANDIN) 0.5 MG tablet TAKE TWO TABLETS BY MOUTH THREE TIMES A DAY BEFORE A MEAL 180 tablet 2   tirzepatide (MOUNJARO) 7.5 MG/0.5ML Pen Inject 7.5 mg into the skin once a week. 6 mL 0   Turmeric 500 MG CAPS Take 500 mg by mouth daily.     venlafaxine XR (EFFEXOR-XR) 37.5 MG 24 hr capsule TAKE ONE CAPSULE BY MOUTH DAILY WITH BREAKFAST 90 capsule 2   VITRON-C 65-125 MG TABS Take 1 tablet by mouth  daily.     No current facility-administered medications on file prior to visit.     Review of Systems  Constitutional:  Negative for fever.  Respiratory:  Positive for cough (intermittent). Negative for shortness of breath and wheezing.   Cardiovascular:  Negative for chest pain, palpitations and leg swelling.  Neurological:  Positive for headaches (right temple region HA x 24 hrs). Negative for dizziness and light-headedness.  Psychiatric/Behavioral:         Brain fog       Objective:   Vitals:   05/30/23 0805  BP: 116/80  Pulse: 80  Temp: 98.3 F (36.8 C)  SpO2: 95%   BP Readings from Last 3 Encounters:  05/30/23 116/80  04/14/23 (!) 143/96  03/01/23 128/86   Wt Readings from Last 3 Encounters:  05/30/23 242 lb (109.8 kg)  03/01/23 245 lb 9.6 oz (111.4 kg)  03/01/23 244 lb (110.7 kg)   Body mass index is 47.26 kg/m.    Physical Exam Constitutional:      General: She is not in acute distress.    Appearance: Normal appearance.  HENT:     Head: Normocephalic and atraumatic.  Eyes:     Conjunctiva/sclera: Conjunctivae normal.  Cardiovascular:     Rate and Rhythm: Normal rate and regular rhythm.     Heart sounds: Normal heart sounds.  Pulmonary:     Effort: Pulmonary effort is normal. No respiratory distress.     Breath sounds: Normal breath sounds. No wheezing.  Musculoskeletal:     Cervical back: Neck supple.     Right lower leg: No edema.     Left lower leg: No edema.  Lymphadenopathy:     Cervical: No cervical adenopathy.  Skin:    General: Skin is warm and dry.     Findings: No rash.  Neurological:     Mental Status: She is alert. Mental status is at baseline.  Psychiatric:        Mood and Affect: Mood normal.        Behavior: Behavior normal.        Lab Results  Component Value Date   WBC 6.6 06/07/2022   HGB 14.7 06/07/2022   HCT 45.9 06/07/2022   PLT 190.0 06/07/2022   GLUCOSE 128 (H) 03/01/2023   CHOL 158 03/01/2023   TRIG 50.0  03/01/2023   HDL 52.20 03/01/2023   LDLDIRECT 153.1 12/08/2012   LDLCALC 96 03/01/2023   ALT 25 03/01/2023   AST 27 03/01/2023   NA 139 03/01/2023  K 4.0 03/01/2023   CL 101 03/01/2023   CREATININE 0.68 03/01/2023   BUN 15 03/01/2023   CO2 31 03/01/2023   TSH 1.19 06/07/2022   INR 1.01 01/30/2017   HGBA1C 9.3 (H) 03/01/2023   MICROALBUR <0.7 06/07/2022     Assessment & Plan:    See Problem List for Assessment and Plan of chronic medical problems.

## 2023-05-30 ENCOUNTER — Encounter: Payer: Self-pay | Admitting: Internal Medicine

## 2023-05-30 ENCOUNTER — Ambulatory Visit (INDEPENDENT_AMBULATORY_CARE_PROVIDER_SITE_OTHER): Payer: Medicare Other | Admitting: Internal Medicine

## 2023-05-30 VITALS — BP 116/80 | HR 80 | Temp 98.3°F | Ht 60.0 in | Wt 242.0 lb

## 2023-05-30 DIAGNOSIS — K219 Gastro-esophageal reflux disease without esophagitis: Secondary | ICD-10-CM | POA: Diagnosis not present

## 2023-05-30 DIAGNOSIS — F419 Anxiety disorder, unspecified: Secondary | ICD-10-CM

## 2023-05-30 DIAGNOSIS — M25562 Pain in left knee: Secondary | ICD-10-CM | POA: Diagnosis not present

## 2023-05-30 DIAGNOSIS — G8929 Other chronic pain: Secondary | ICD-10-CM | POA: Diagnosis not present

## 2023-05-30 DIAGNOSIS — Z7985 Long-term (current) use of injectable non-insulin antidiabetic drugs: Secondary | ICD-10-CM | POA: Diagnosis not present

## 2023-05-30 DIAGNOSIS — E1165 Type 2 diabetes mellitus with hyperglycemia: Secondary | ICD-10-CM

## 2023-05-30 DIAGNOSIS — E785 Hyperlipidemia, unspecified: Secondary | ICD-10-CM | POA: Diagnosis not present

## 2023-05-30 DIAGNOSIS — I10 Essential (primary) hypertension: Secondary | ICD-10-CM

## 2023-05-30 LAB — COMPREHENSIVE METABOLIC PANEL
ALT: 23 U/L (ref 0–35)
AST: 25 U/L (ref 0–37)
Albumin: 3.7 g/dL (ref 3.5–5.2)
Alkaline Phosphatase: 85 U/L (ref 39–117)
BUN: 10 mg/dL (ref 6–23)
CO2: 29 mEq/L (ref 19–32)
Calcium: 9.4 mg/dL (ref 8.4–10.5)
Chloride: 101 mEq/L (ref 96–112)
Creatinine, Ser: 0.61 mg/dL (ref 0.40–1.20)
GFR: 103.7 mL/min (ref >60.00)
Glucose, Bld: 156 mg/dL — ABNORMAL HIGH (ref 70–99)
Potassium: 3.6 mEq/L (ref 3.5–5.1)
Sodium: 138 mEq/L (ref 135–145)
Total Bilirubin: 0.4 mg/dL (ref 0.2–1.2)
Total Protein: 7.3 g/dL (ref 6.0–8.3)

## 2023-05-30 LAB — MICROALBUMIN / CREATININE URINE RATIO
Creatinine,U: 55.1 mg/dL
Microalb Creat Ratio: 1.3 mg/g (ref 0.0–30.0)
Microalb, Ur: <0.7 mg/dL (ref 0.0–1.9)

## 2023-05-30 LAB — LIPID PANEL
Cholesterol: 171 mg/dL (ref 0–200)
HDL: 46.4 mg/dL (ref >39.00)
LDL Cholesterol: 111 mg/dL — ABNORMAL HIGH (ref 0–99)
NonHDL: 124.48
Total CHOL/HDL Ratio: 4
Triglycerides: 66 mg/dL (ref 0.0–149.0)
VLDL: 13.2 mg/dL (ref 0.0–40.0)

## 2023-05-30 LAB — CBC WITH DIFFERENTIAL/PLATELET
Basophils Absolute: 0 10*3/uL (ref 0.0–0.1)
Basophils Relative: 0.3 % (ref 0.0–3.0)
Eosinophils Absolute: 0.2 10*3/uL (ref 0.0–0.7)
Eosinophils Relative: 2.8 % (ref 0.0–5.0)
HCT: 44.9 % (ref 36.0–46.0)
Hemoglobin: 14.3 g/dL (ref 12.0–15.0)
Lymphocytes Relative: 40.9 % (ref 12.0–46.0)
Lymphs Abs: 2.3 10*3/uL (ref 0.7–4.0)
MCHC: 31.8 g/dL (ref 30.0–36.0)
MCV: 93.2 fl (ref 78.0–100.0)
Monocytes Absolute: 0.6 10*3/uL (ref 0.1–1.0)
Monocytes Relative: 11.1 % (ref 3.0–12.0)
Neutro Abs: 2.5 10*3/uL (ref 1.4–7.7)
Neutrophils Relative %: 44.9 % (ref 43.0–77.0)
Platelets: 241 10*3/uL (ref 150.0–400.0)
RBC: 4.82 Mil/uL (ref 3.87–5.11)
RDW: 14.8 % (ref 11.5–15.5)
WBC: 5.5 10*3/uL (ref 4.0–10.5)

## 2023-05-30 LAB — HEMOGLOBIN A1C: Hgb A1c MFr Bld: 9.4 % — ABNORMAL HIGH (ref 4.6–6.5)

## 2023-05-30 NOTE — Assessment & Plan Note (Addendum)
Chronic   Lab Results  Component Value Date   HGBA1C 9.3 (H) 03/01/2023   Sugars not ideally controlled Has been prednisone so sugars will likely not be controlled Testing sugars some days Check A1c Continue Jardiance 25 mg daily, Prandin 0.5 mg 3 times daily AC, mounjaro 7.5 mg weekly Discussed that we can increase Mounjaro further if needed or wanted Stressed regular exercise, diabetic diet Stressed the importance of weight loss

## 2023-05-30 NOTE — Assessment & Plan Note (Signed)
Chronic Secondary to osteoarthritis Stressed weight loss, regular exercise Continue Celebrex 200 mg twice daily as needed - typically takes it once a day

## 2023-05-30 NOTE — Assessment & Plan Note (Signed)
Chronic Blood pressure well controlled CMP Continue losartan 12.5 mg daily 

## 2023-05-30 NOTE — Assessment & Plan Note (Signed)
Chronic Controlled, Stable Continue Effexor 37.5 mg daily 

## 2023-05-30 NOTE — Assessment & Plan Note (Signed)
Chronic GERD controlled Continue protonix 40 mg bid GERD diet Continue weight loss efforts

## 2023-05-30 NOTE — Assessment & Plan Note (Signed)
Chronic °Regular exercise and healthy diet encouraged °Check lipid panel  °Continue atorvastatin 20 mg daily °

## 2023-05-30 NOTE — Assessment & Plan Note (Signed)
Chronic Stressed weight loss  [name] is helping - will titrate up dose as tolerated Discussed regular exercise Stressed decreasing calorie intake-eating low sugar/carbohydrate diet high in protein and vegetables

## 2023-05-31 ENCOUNTER — Other Ambulatory Visit: Payer: Self-pay | Admitting: Internal Medicine

## 2023-06-04 MED ORDER — TIRZEPATIDE 10 MG/0.5ML ~~LOC~~ SOAJ: 10.0000 mg | SUBCUTANEOUS | 0 refills | Status: DC

## 2023-06-24 DIAGNOSIS — J84112 Idiopathic pulmonary fibrosis: Secondary | ICD-10-CM | POA: Diagnosis not present

## 2023-06-26 ENCOUNTER — Other Ambulatory Visit: Payer: Self-pay | Admitting: Internal Medicine

## 2023-07-02 ENCOUNTER — Other Ambulatory Visit: Payer: Self-pay | Admitting: Internal Medicine

## 2023-07-06 ENCOUNTER — Ambulatory Visit: Payer: Medicare Other

## 2023-07-06 DIAGNOSIS — Z23 Encounter for immunization: Secondary | ICD-10-CM

## 2023-07-11 ENCOUNTER — Encounter: Payer: Self-pay | Admitting: Internal Medicine

## 2023-07-11 NOTE — Telephone Encounter (Signed)
Patient called and said she has had diarrhea for three days because of her medication. She is worried about dehydration and said she feels terrible. Patient would like a call back about what to do. Best callback is [phone].

## 2023-07-23 ENCOUNTER — Encounter: Payer: Self-pay | Admitting: Internal Medicine

## 2023-07-24 DIAGNOSIS — J709 Respiratory conditions due to unspecified external agent: Secondary | ICD-10-CM | POA: Diagnosis not present

## 2023-07-24 MED ORDER — OZEMPIC (0.25 OR 0.5 MG/DOSE) 2 MG/3ML ~~LOC~~ SOPN: 0.2500 mg | PEN_INJECTOR | SUBCUTANEOUS | 0 refills | Status: DC

## 2023-07-25 ENCOUNTER — Telehealth: Payer: Self-pay

## 2023-07-25 ENCOUNTER — Other Ambulatory Visit (HOSPITAL_COMMUNITY): Payer: Self-pay

## 2023-07-25 NOTE — Telephone Encounter (Addendum)
Pharmacy Patient Advocate Encounter   Received notification from Physician's Office that prior authorization for Ozempic is required/requested.   Insurance verification completed.   The patient is insured through [employer] [facility] .   Per test claim: PA required; PA submitted to [employer] [facility] via CoverMyMeds Key/confirmation #/EOC Key: [id number] Status is pending

## 2023-07-30 ENCOUNTER — Other Ambulatory Visit (HOSPITAL_COMMUNITY): Payer: Self-pay

## 2023-07-30 NOTE — Telephone Encounter (Signed)
Pharmacy Patient Advocate Encounter  Received notification from [facility] that Prior Authorization for Ozempic (0.25 or 0.5 MG/DOSE) 2MG /3ML pen-injectors has been APPROVED from 07/29/2023 to 07/28/2024. Ran test claim, Copay is $24.99 for one month supply at [facility] pharmacy with an e-voucher, $47.00 at other retail pharmacies. This test claim was processed through [facility]- copay amounts may vary at other pharmacies due to pharmacy/plan contracts, or as the patient moves through the different stages of their insurance plan.   PA #/Case ID/Reference #: [id number]

## 2023-07-30 NOTE — Telephone Encounter (Signed)
Patient informed via my chart.

## 2023-07-31 ENCOUNTER — Telehealth: Payer: Self-pay

## 2023-07-31 NOTE — Telephone Encounter (Signed)
Received faxed Breztri APPROVAL from [employer]:  APPROVED: 10/02/23 to 09/30/24.

## 2023-08-07 ENCOUNTER — Encounter: Payer: Self-pay | Admitting: Internal Medicine

## 2023-08-15 ENCOUNTER — Ambulatory Visit: Payer: Medicare Other | Admitting: Allergy & Immunology

## 2023-08-24 DIAGNOSIS — J709 Respiratory conditions due to unspecified external agent: Secondary | ICD-10-CM | POA: Diagnosis not present

## 2023-09-02 ENCOUNTER — Other Ambulatory Visit: Payer: Self-pay | Admitting: Internal Medicine

## 2023-09-10 DIAGNOSIS — L03011 Cellulitis of right finger: Secondary | ICD-10-CM | POA: Diagnosis not present

## 2023-09-23 DIAGNOSIS — J709 Respiratory conditions due to unspecified external agent: Secondary | ICD-10-CM | POA: Diagnosis not present

## 2023-10-02 ENCOUNTER — Other Ambulatory Visit: Payer: Self-pay | Admitting: Internal Medicine

## 2023-10-10 ENCOUNTER — Ambulatory Visit (INDEPENDENT_AMBULATORY_CARE_PROVIDER_SITE_OTHER): Payer: Medicare PPO | Admitting: Allergy & Immunology

## 2023-10-10 ENCOUNTER — Other Ambulatory Visit: Payer: Self-pay

## 2023-10-10 ENCOUNTER — Encounter: Payer: Self-pay | Admitting: Allergy & Immunology

## 2023-10-10 VITALS — BP 120/86 | HR 96 | Temp 98.0°F | Resp 16 | Ht 60.63 in | Wt 236.3 lb

## 2023-10-10 DIAGNOSIS — J302 Other seasonal allergic rhinitis: Secondary | ICD-10-CM

## 2023-10-10 DIAGNOSIS — W57XXXD Bitten or stung by nonvenomous insect and other nonvenomous arthropods, subsequent encounter: Secondary | ICD-10-CM

## 2023-10-10 DIAGNOSIS — J84112 Idiopathic pulmonary fibrosis: Secondary | ICD-10-CM | POA: Diagnosis not present

## 2023-10-10 DIAGNOSIS — J3089 Other allergic rhinitis: Secondary | ICD-10-CM | POA: Diagnosis not present

## 2023-10-10 MED ORDER — BREZTRI AEROSPHERE 160-9-4.8 MCG/ACT IN AERO: 2.0000 | INHALATION_SPRAY | Freq: Two times a day (BID) | RESPIRATORY_TRACT | 12 refills | Status: DC

## 2023-10-10 MED ORDER — CLOBETASOL PROPIONATE 0.05 % EX OINT: 1.0000 | TOPICAL_OINTMENT | Freq: Two times a day (BID) | CUTANEOUS | 5 refills | Status: AC

## 2023-10-10 NOTE — Patient Instructions (Addendum)
 1. Idiopathic pulmonary fibrosis - Lung testing looked stable today.  - We are not going to make any medication changes.  - It seems that you have an excellent handle on your symptoms.  - We will continue with the Breztri  and refill the [place] sample.  - Daily controller medication(s): Breztri  two puffs twice daily + prednisone  5 mg daily - Prior to physical activity: albuterol  2 puffs 10-15 minutes before physical activity. - Rescue medications: albuterol  4 puffs every 4-6 hours as needed - Asthma control goals:  * Full participation in all desired activities (may need albuterol  before activity) * Albuterol  use two time or less a week on average (not counting use with activity) * Cough interfering with sleep two time or less a month * Oral steroids no more than once a year * No hospitalizations  2.  Seasonal and perennial rhinitis/allergic conjunctivitis (dust mites, grasses, ragweed, weeds, trees, and indoor and outdoor molds) - Continue with cetirizine 10mg  daily.  - I am good with with avoiding allergy  shots.   3. 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 - Use for 3 - 5 days maximum to see if this can limit the inflammation.  - Refill provided today for the clobetasol .   4. Return in about 6 months (around 04/08/2024). You can have the follow up appointment with Dr. Iva or a Nurse Practicioner (our Nurse Practitioners are excellent and always have Physician oversight!).    Please inform us  of any Emergency Department visits, hospitalizations, or changes in symptoms. Call us  before going to the ED for breathing or allergy  symptoms since we might be able to fit you in for a sick visit. Feel free to contact us  anytime with any questions, problems, or concerns.  It was a pleasure to see you again today!  Websites that have  reliable patient information: 1. American Academy of Asthma, Allergy , and Immunology: www.aaaai.org 2. Food Allergy  Research and Education (FARE): foodallergy.org 3. Mothers of Asthmatics: http://www.asthmacommunitynetwork.org 4. American College of Allergy , Asthma, and Immunology: www.acaai.org      "Like" us  on Facebook and Instagram for our latest updates!      A healthy democracy works best when [employer] participate! Make sure you are registered to vote! If you have moved or changed any of your contact information, you will need to get this updated before voting! Scan the QR codes below to learn more!

## 2023-10-10 NOTE — Progress Notes (Signed)
 FOLLOW UP  Date of Service/Encounter:  10/10/23   Assessment:   Idiopathic pulmonary fibrosis - with improved lung function today (maintained on prednisone  5 mg daily)   Doing better clinically on Breztri , therefore likely some component of asthma   41 seasonal allergic rhinitis (dust mites, grasses, ragweed, weeds, trees, and indoor and outdoor molds) - well controlled with medications alone   GERD - on PPO   On disability  Plan/Recommendations:   1. Idiopathic pulmonary fibrosis - Lung testing looked stable today.  - We are not going to make any medication changes.  - It seems that you have an excellent handle on your symptoms.  - We will continue with the Breztri  and refill the [place] sample.  - Daily controller medication(s): Breztri  two puffs twice daily + prednisone  5 mg daily - Prior to physical activity: albuterol  2 puffs 10-15 minutes before physical activity. - Rescue medications: albuterol  4 puffs every 4-6 hours as needed - Asthma control goals:  * Full participation in all desired activities (may need albuterol  before activity) * Albuterol  use two time or less a week on average (not counting use with activity) * Cough interfering with sleep two time or less a month * Oral steroids no more than once a year * No hospitalizations  2.  Seasonal and perennial rhinitis/allergic conjunctivitis (dust mites, grasses, ragweed, weeds, trees, and indoor and outdoor molds) - Continue with cetirizine 10mg  daily.  - I am good with with avoiding allergy  shots.   3. 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 - Use for 3 - 5 days maximum to see if this can limit the inflammation.  - Refill provided today for the clobetasol .   4. Return in about 6 months (around 04/08/2024). You can have the follow up appointment with Dr.  Iva or a Nurse Practicioner (our Nurse Practitioners are excellent and always have Physician oversight!).    Subjective:   [name] is a 53 y.o. female presenting today for follow up of  Chief Complaint  Patient presents with   Allergies    [name] has a history of the following: Patient Active Problem List   Diagnosis Date Noted   Temporomandibular joint (TMJ) pain 11/28/2022   Urinary frequency 06/28/2021   Change in stool 06/28/2021   Perianal irritation 06/28/2021   Adenomatous polyp of transverse colon    On home oxygen  therapy 03/27/2021   Genital herpes 04/11/2020   History of nephrolithiasis 10/13/2019   Anxiety 02/05/2018   Peroneal tendinitis of lower leg, right 10/22/2017   Chronic respiratory failure with hypoxia (HCC) 07/20/2017   Knee pain, left 02/02/2016   Cough variant asthma 03/09/2015   Diabetes (HCC) 12/09/2012   Dyslipidemia    Acute on chronic respiratory failure with hypoxia (HCC) 04/05/2012   Allergic rhinitis 01/05/2010   Essential hypertension 09/07/2009   URINARY INCONTINENCE, URGE 03/24/2009   CHRONIC RHINITIS 12/23/2008   Morbid obesity due to excess calories (HCC) 10/10/2007   INTERSTITIAL LUNG DISEASE 10/10/2007   GASTROESOPHAGEAL REFLUX DISEASE 10/10/2007    History obtained from: chart review and patient.  Discussed the use of AI scribe software for clinical note transcription with the patient and/or guardian, who gave verbal consent to proceed.  [name] is a 53 y.o. female presenting for a follow up  visit.  She was last seen in May 2024.  At that time, lung testing was in the 50% range.  We continue with Breztri  2 puffs twice daily.  She continued to follow with pulmonology for management of her idiopathic pulmonary fibrosis.  For her seasonal and perennial allergic rhinitis, we continue with cetirizine 10 mg daily.  Allergic rhinitis symptoms were under pretty good control, so we did not recommend pursuing allergen  immunotherapy at that time.  She has a history of large local reactions to insects.  We added on clobetasol  to use 3 times daily for a few days to see if that helps.  Since last visit, she has done well.   Asthma/Respiratory Symptom History: She continues to follow with Dr. Darlean due to her history of pulmonary fibrosis, reports stable respiratory symptoms. She has been managing her condition with prednisone , which she self-adjusts up to 20mg  during flare-ups, and Breztri , two puffs twice a day. She reports no recent hospitalizations or exacerbations of her pulmonary fibrosis.  She has had not had any flares at all.  She has never been started on any of the newer medications for pulmonary fibrosis because her condition has seemed very stable.  She has seen Dr. Geronimo in the past, but now Dr. Darlean manages her condition.  She only goes to see him once per year.  She was diagnosed initially in her 30s, but of course they do not know the reasoning for her condition.  Thankfully, and has remained very stable.  Allergic Rhinitis Symptom History: [name] does report some dryness due to heating at home, which she manages with a humidifier. She has experienced nosebleeds, which she attributes to the dryness. She has not been recently sick with a sinus infection or any other illness.  Skin Symptom History: The patient also reports occasional insect bites, which she manages with an ointment. She has not reported any allergic reactions or need for allergy  shots.  She has a history of significant weight loss, which she attributes to intentionality and discipline. She reports an increase in lung function, contrary to the usual course of her disease. She has been living with pulmonary fibrosis for 15 years.  Otherwise, there have been no changes to her past medical history, surgical history, family history, or social history.    Review of systems otherwise negative other than that mentioned in the  HPI.    Objective:   Blood pressure 120/86, pulse 96, temperature 98 F (36.7 C), temperature source Temporal, resp. rate 16, height 5' 0.63 (1.54 m), weight 236 lb 4.8 oz (107.2 kg), SpO2 96%. Body mass index is 45.2 kg/m.    Physical Exam Vitals reviewed.  Constitutional:      Appearance: She is well-developed.     Comments: Very lovely. Smiling.   HENT:     Head: Normocephalic and atraumatic.     Right Ear: Tympanic membrane, ear canal and external ear normal. No drainage, swelling or tenderness. Tympanic membrane is not injected, scarred, erythematous, retracted or bulging.     Left Ear: Tympanic membrane, ear canal and external ear normal. No drainage, swelling or tenderness. Tympanic membrane is not injected, scarred, erythematous, retracted or bulging.     Nose: No nasal deformity, septal deviation, mucosal edema or rhinorrhea.     Right Turbinates: Enlarged, swollen and pale.     Left Turbinates: Enlarged, swollen and pale.     Right Sinus: No maxillary sinus tenderness or frontal sinus tenderness.     Left Sinus:  No maxillary sinus tenderness or frontal sinus tenderness.     Mouth/Throat:     Lips: Pink.     Mouth: Mucous membranes are moist. Mucous membranes are not pale and not dry.     Pharynx: Oropharynx is clear. Uvula midline.  Eyes:     General:        Right eye: No discharge.        Left eye: No discharge.     Conjunctiva/sclera: Conjunctivae normal.     Right eye: Right conjunctiva is not injected. No chemosis.    Left eye: Left conjunctiva is not injected. No chemosis.    Pupils: Pupils are equal, round, and reactive to light.  Cardiovascular:     Rate and Rhythm: Normal rate and regular rhythm.     Heart sounds: Normal heart sounds.  Pulmonary:     Effort: Pulmonary effort is normal. No tachypnea, accessory muscle usage or respiratory distress.     Breath sounds: Examination of the right-lower field reveals wheezing. Examination of the left-lower field  reveals wheezing. Wheezing present. No rhonchi or rales.     Comments: Crackles at the bilateral bases. Moving air better today than previous visits.  This appears to be her baseline.  Chest:     Chest wall: No tenderness.  Abdominal:     Tenderness: There is no abdominal tenderness. There is no guarding or rebound.  Lymphadenopathy:     Head:     Right side of head: No submandibular, tonsillar or occipital adenopathy.     Left side of head: No submandibular, tonsillar or occipital adenopathy.     Cervical: No cervical adenopathy.  Skin:    Coloration: Skin is not pale.     Findings: No abrasion, erythema, petechiae or rash. Rash is not papular, urticarial or vesicular.  Neurological:     Mental Status: She is alert.  Psychiatric:        Behavior: Behavior is cooperative.      Diagnostic studies:    Spirometry: results abnormal (FEV1: 1.04/49%, FVC: 1.50/58%, FEV1/FVC: 69%).    Spirometry consistent with mixed obstructive and restrictive disease.   Allergy  Studies: none        Marty Shaggy, MD  [facility] 

## 2023-10-11 ENCOUNTER — Encounter: Payer: Self-pay | Admitting: Allergy & Immunology

## 2023-10-11 ENCOUNTER — Other Ambulatory Visit: Payer: Self-pay | Admitting: Internal Medicine

## 2023-10-11 ENCOUNTER — Telehealth: Payer: Self-pay | Admitting: *Deleted

## 2023-10-11 DIAGNOSIS — E1165 Type 2 diabetes mellitus with hyperglycemia: Secondary | ICD-10-CM

## 2023-10-11 MED ORDER — ACCU-CHEK AVIVA PLUS W/DEVICE KIT: PACK | 0 refills | Status: AC

## 2023-10-11 NOTE — Telephone Encounter (Signed)
 Printed prescription has been faxed to [employer] at [phone] for refills of Breztri.

## 2023-10-15 ENCOUNTER — Other Ambulatory Visit (HOSPITAL_COMMUNITY): Payer: Self-pay

## 2023-10-29 ENCOUNTER — Other Ambulatory Visit: Payer: Self-pay | Admitting: Internal Medicine

## 2023-10-29 ENCOUNTER — Other Ambulatory Visit (HOSPITAL_COMMUNITY): Payer: Self-pay

## 2023-11-04 ENCOUNTER — Other Ambulatory Visit: Payer: Self-pay

## 2023-11-04 ENCOUNTER — Encounter: Payer: Self-pay | Admitting: Internal Medicine

## 2023-11-04 DIAGNOSIS — E1165 Type 2 diabetes mellitus with hyperglycemia: Secondary | ICD-10-CM

## 2023-11-04 MED ORDER — ACCU-CHEK GUIDE TEST VI STRP: ORAL_STRIP | 12 refills | Status: AC

## 2023-11-06 ENCOUNTER — Other Ambulatory Visit: Payer: Self-pay | Admitting: Internal Medicine

## 2023-11-24 DIAGNOSIS — J709 Respiratory conditions due to unspecified external agent: Secondary | ICD-10-CM | POA: Diagnosis not present

## 2023-11-29 ENCOUNTER — Ambulatory Visit: Payer: Medicare Other | Admitting: Internal Medicine

## 2023-12-03 DIAGNOSIS — M25511 Pain in right shoulder: Secondary | ICD-10-CM | POA: Diagnosis not present

## 2023-12-05 ENCOUNTER — Telehealth: Payer: Self-pay

## 2023-12-05 NOTE — Telephone Encounter (Signed)
 Patient was identified as falling into the [place] Measure - Diabetes.   Patient was: Appointment scheduled with primary care provider in the next 30 days. Patient is scheduled for 12/30/2023 for A1c follow up.

## 2023-12-07 ENCOUNTER — Emergency Department (HOSPITAL_BASED_OUTPATIENT_CLINIC_OR_DEPARTMENT_OTHER)
Admission: EM | Admit: 2023-12-07 | Discharge: 2023-12-07 | Disposition: A | Discharge status: Home / Self Care | Attending: Emergency Medicine | Admitting: Emergency Medicine

## 2023-12-07 ENCOUNTER — Encounter (HOSPITAL_BASED_OUTPATIENT_CLINIC_OR_DEPARTMENT_OTHER): Payer: Self-pay | Admitting: Emergency Medicine

## 2023-12-07 ENCOUNTER — Other Ambulatory Visit: Payer: Self-pay

## 2023-12-07 DIAGNOSIS — E1165 Type 2 diabetes mellitus with hyperglycemia: Secondary | ICD-10-CM | POA: Insufficient documentation

## 2023-12-07 DIAGNOSIS — Z79899 Other long term (current) drug therapy: Secondary | ICD-10-CM | POA: Insufficient documentation

## 2023-12-07 DIAGNOSIS — R739 Hyperglycemia, unspecified: Secondary | ICD-10-CM

## 2023-12-07 DIAGNOSIS — I10 Essential (primary) hypertension: Secondary | ICD-10-CM | POA: Diagnosis not present

## 2023-12-07 DIAGNOSIS — Z7982 Long term (current) use of aspirin: Secondary | ICD-10-CM | POA: Diagnosis not present

## 2023-12-07 LAB — CBG MONITORING, ED
Glucose-Capillary: 333 mg/dL — ABNORMAL HIGH (ref 70–99)
Glucose-Capillary: 226 mg/dL — ABNORMAL HIGH (ref 70–99)

## 2023-12-07 LAB — URINALYSIS, ROUTINE W REFLEX MICROSCOPIC
Bacteria, UA: NONE SEEN
Bilirubin Urine: NEGATIVE
Glucose, UA: >1000 mg/dL — AB
Hgb urine dipstick: NEGATIVE
Ketones, ur: NEGATIVE mg/dL
Leukocytes,Ua: NEGATIVE
Nitrite: NEGATIVE
Protein, ur: NEGATIVE mg/dL
Specific Gravity, Urine: 1.034 — ABNORMAL HIGH (ref 1.005–1.030)
pH: 5.5 (ref 5.0–8.0)

## 2023-12-07 LAB — BASIC METABOLIC PANEL
Anion gap: 8 (ref 5–15)
BUN: 19 mg/dL (ref 6–20)
CO2: 28 mmol/L (ref 22–32)
Calcium: 9.3 mg/dL (ref 8.9–10.3)
Chloride: 97 mmol/L — ABNORMAL LOW (ref 98–111)
Creatinine, Ser: 0.9 mg/dL (ref 0.44–1.00)
GFR, Estimated: >60 mL/min (ref >60)
Glucose, Bld: 357 mg/dL — ABNORMAL HIGH (ref 70–99)
Potassium: 4.2 mmol/L (ref 3.5–5.1)
Sodium: 133 mmol/L — ABNORMAL LOW (ref 135–145)

## 2023-12-07 LAB — CBC
HCT: 46.3 % — ABNORMAL HIGH (ref 36.0–46.0)
Hemoglobin: 15 g/dL (ref 12.0–15.0)
MCH: 29.3 pg (ref 26.0–34.0)
MCHC: 32.4 g/dL (ref 30.0–36.0)
MCV: 90.4 fL (ref 80.0–100.0)
Platelets: 205 10*3/uL (ref 150–400)
RBC: 5.12 MIL/uL — ABNORMAL HIGH (ref 3.87–5.11)
RDW: 14.6 % (ref 11.5–15.5)
WBC: 8.3 10*3/uL (ref 4.0–10.5)
nRBC: 0 % (ref 0.0–0.2)

## 2023-12-07 MED ORDER — SODIUM CHLORIDE 0.9 % IV BOLUS
1000.0000 mL | Freq: Once | INTRAVENOUS | Status: AC
  Administered 2023-12-07: 1000 mL via INTRAVENOUS

## 2023-12-07 MED ORDER — INSULIN ASPART 100 UNIT/ML IJ SOLN
10.0000 [IU] | Freq: Once | INTRAMUSCULAR | Status: AC
  Administered 2023-12-07: 10 [IU] via SUBCUTANEOUS

## 2023-12-07 NOTE — Discharge Instructions (Signed)
 Your hyperglycemia is likely due to recent steroid injection.  This will improve over time.  Follow-up closely with your doctor for further care.

## 2023-12-07 NOTE — ED Provider Notes (Signed)
 [place] EMERGENCY DEPARTMENT AT [facility] Provider Note   CSN: [id number] Arrival date & time: 12/07/23  1230     History  Chief Complaint  Patient presents with   Hyperglycemia    Eliya JONASIA COINER is a 53 y.o. female.  The history is provided by the patient and medical records. No language interpreter was used.  Hyperglycemia    53 year old female history of diabetes, ILD, GERD, obesity, dyslipidemia, fibromyalgia, hypertension presenting to the ER with concerns of elevated blood sugar.  Patient states she had a cortisone shot into her right shoulder 6 days ago.  Since then she noticed that her blood sugar is wildly out of control and will spike up to more than 400.  She endorsed increased thirst and urination as well as muscle cramping.  She was concerned as her blood sugar usually pretty well-controlled with Jardiance and Ozempic.  She denies any change in her diet or exercise and no other changes except for the recent cortisone shot.  No fever or chills runny nose sneezing coughing.  Home Medications Prior to Admission medications   Medication Sig Start Date End Date Taking? Authorizing Provider  glucose blood (ACCU-CHEK GUIDE TEST) test strip Use as instructed 11/04/23   Pincus Sanes, MD  Accu-Chek Softclix Lancets lancets Use to check blood sugars twice a day 06/29/20   Pincus Sanes, MD  albuterol (PROVENTIL) (2.5 MG/3ML) 0.083% nebulizer solution Take 3 mLs (2.5 mg total) by nebulization every 6 (six) hours as needed for wheezing or shortness of breath. 01/22/23   Alfonse Spruce, MD  albuterol (VENTOLIN HFA) 108 (90 Base) MCG/ACT inhaler Inhale 2 puffs into the lungs every 4 (four) hours as needed for wheezing or shortness of breath. 01/22/23   Alfonse Spruce, MD  aspirin EC 81 MG tablet Take 81 mg by mouth daily. Swallow whole.    [provider]  atorvastatin (LIPITOR) 20 MG tablet TAKE 1 TABLET BY MOUTH DAILY ** NEEDS TO MAKE AN APPOINTMENT FOR  FUTURE REFILLS ** 10/03/23   Pincus Sanes, MD  Blood Glucose Monitoring Suppl (ACCU-CHEK AVIVA PLUS) w/Device KIT Use to check blood sugars twice a day 10/11/23   Pincus Sanes, MD  Budeson-Glycopyrrol-Formoterol (BREZTRI AEROSPHERE) 160-9-4.8 MCG/ACT AERO Inhale 2 puffs into the lungs in the morning and at bedtime. 10/10/23   Alfonse Spruce, MD  calcium-vitamin D (OSCAL WITH D) 500-200 MG-UNIT tablet Take 1 tablet by mouth daily.    [provider]  celecoxib (CELEBREX) 200 MG capsule TAKE ONE CAPSULE BY MOUTH TWICE A DAY 10/16/22   Pincus Sanes, MD  Cholecalciferol (VITAMIN D3) 50 MCG (2000 UT) capsule Take 1 capsule (2,000 Units total) by mouth daily. 09/28/20   Plotnikov, Georgina Quint, MD  clobetasol ointment (TEMOVATE) 0.05 % Apply 1 Application topically 2 (two) times daily. Use for 3-5 days max per time. 10/10/23   Alfonse Spruce, MD  Dextromethorphan-guaiFENesin [facility] DM MAXIMUM STRENGTH) 60-1200 MG TB12 Take 1 tablet by mouth every 12 (twelve) hours as needed (with flutter valve).    [provider]  ELDERBERRY PO Take 1,000 mg by mouth daily.    [provider]  fluticasone (FLONASE) 50 MCG/ACT nasal spray Place 1 to 2 sprays in each nostril once a day as needed for stuffy nose. In the right nostril, point the applicator out toward the right ear. In the left nostril, point the applicator out toward the left ear 01/23/23   Nehemiah Settle, FNP  glucose blood (ACCU-CHEK AVIVA PLUS) test strip USE TO CHECK FOR BLOOD SUGAR TWO TIMES A DAY 07/10/21   Burns, Bobette Mo, MD  JARDIANCE 25 MG TABS tablet TAKE 1 TABLET BY MOUTH EVERY MORNING WITH OR WITHOUT FOOD 09/02/23   Pincus Sanes, MD  loratadine (CLARITIN) 10 MG tablet Take 10 mg by mouth at bedtime.    [provider]  losartan (COZAAR) 25 MG tablet TAKE 1/2 TABLET BY MOUTH DAILY 10/03/23   Pincus Sanes, MD  Olopatadine HCl 0.2 % SOLN Place 1 drop in each eye once a day as needed for itchy watery eyes  01/23/23   Nehemiah Settle, FNP  OXYGEN 2lpm with sleep and 3lpm with exertion    [provider]  pantoprazole (PROTONIX) 40 MG tablet TAKE 1 TABLET BY MOUTH TWICE A DAY BEFORE MEALS 12/19/22   Burns, Bobette Mo, MD  predniSONE (DELTASONE) 5 MG tablet Take 1 tablet (5 mg total) by mouth daily. 03/01/23   Nyoka Cowden, MD  repaglinide (PRANDIN) 0.5 MG tablet TAKE TWO TABLETS BY MOUTH THREE TIMES A DAY BEFORE A MEAL 11/06/23   Burns, Bobette Mo, MD  Semaglutide,0.25 or 0.5MG /DOS, (OZEMPIC, 0.25 OR 0.5 MG/DOSE,) 2 MG/3ML SOPN Inject 0.5 mg into the skin once a week. 09/03/23   Pincus Sanes, MD  Turmeric 500 MG CAPS Take 500 mg by mouth daily.    [provider]  venlafaxine XR (EFFEXOR-XR) 37.5 MG 24 hr capsule TAKE 1 CAPSULE BY MOUTH DAILY WITH BREAKFAST 10/29/23   Burns, Bobette Mo, MD  VITRON-C 65-125 MG TABS Take 1 tablet by mouth daily.    [provider]      Allergies    Penicillins, Metformin and related, Other, Peach flavoring agent (non-screening), Levaquin [levofloxacin], and Mounjaro [tirzepatide]    Review of Systems   Review of Systems  All other systems reviewed and are negative.   Physical Exam Updated Vital Signs BP (!) 147/98   Pulse 99   Temp 97.8 F (36.6 C)   Resp 18   Ht 5' (1.524 m)   Wt 107.2 kg   SpO2 100%   BMI 46.16 kg/m  Physical Exam Vitals and nursing note reviewed.  Constitutional:      General: She is not in acute distress.    Appearance: She is well-developed.  HENT:     Head: Atraumatic.  Eyes:     Conjunctiva/sclera: Conjunctivae normal.  Cardiovascular:     Rate and Rhythm: Normal rate and regular rhythm.     Pulses: Normal pulses.     Heart sounds: Normal heart sounds.  Pulmonary:     Effort: Pulmonary effort is normal.  Abdominal:     Palpations: Abdomen is soft.     Tenderness: There is no abdominal tenderness.  Musculoskeletal:     Cervical back: Neck supple.     Comments: Bilateral shoulder with full range of  motion  Skin:    Findings: No rash.  Neurological:     Mental Status: She is alert. Mental status is at baseline.  Psychiatric:        Mood and Affect: Mood normal.     ED Results / Procedures / Treatments   Labs (all labs ordered are listed, but only abnormal results are displayed) Labs Reviewed  BASIC METABOLIC PANEL - Abnormal; Notable for the following components:      Result Value   Sodium 133 (*)    Chloride 97 (*)    Glucose, Bld  357 (*)    All other components within normal limits  CBC - Abnormal; Notable for the following components:   RBC 5.12 (*)    HCT 46.3 (*)    All other components within normal limits  URINALYSIS, ROUTINE W REFLEX MICROSCOPIC - Abnormal; Notable for the following components:   Color, Urine COLORLESS (*)    Specific Gravity, Urine 1.034 (*)    Glucose, UA >1,000 (*)    All other components within normal limits  CBG MONITORING, ED - Abnormal; Notable for the following components:   Glucose-Capillary 333 (*)    All other components within normal limits  CBG MONITORING, ED    EKG None  Radiology No results found.  Procedures Procedures    Medications Ordered in ED Medications - No data to display  ED Course/ Medical Decision Making/ A&P                                 Medical Decision Making Amount and/or Complexity of Data Reviewed Labs: ordered.   BP (!) 147/98   Pulse 99   Temp 97.8 F (36.6 C)   Resp 18   Ht 5' (1.524 m)   Wt 107.2 kg   SpO2 100%   BMI 46.16 kg/m   4:57 PM  53 year old female history of diabetes, ILD, GERD, obesity, dyslipidemia, fibromyalgia, hypertension presenting to the ER with concerns of elevated blood sugar.  Patient states she had a cortisone shot into her right shoulder 6 days ago.  Since then she noticed that her blood sugar is wildly out of control and will spike up to more than 400.  She endorsed increased thirst and urination as well as muscle cramping.  She was concerned as her blood  sugar usually pretty well-controlled with Jardiance and Ozempic.  She denies any change in her diet or exercise and no other changes except for the recent cortisone shot.  No fever or chills runny nose sneezing coughing.  Exam overall reassuring patient without any signs of infection.  Heart and lung sounds normal abdomen soft nontender strength is equal throughout.  She is mentating appropriately.  Vital sign overall reassuring.  -Labs ordered, independently viewed and interpreted by me.  Labs remarkable for CBG of 357 without any anion gap or ketones in her urine.  Finding not consistent with DKA.  Labs overall reassuring.  Will provide IV fluid and insulin for symptom control. -The patient was maintained on a cardiac monitor.  I personally viewed and interpreted the cardiac monitored which showed an underlying rhythm of: Normal sinus rhythm -Imaging not considered -This patient presents to the ED for concern of elevated blood sugar, this involves an extensive number of treatment options, and is a complaint that carries with it a high risk of complications and morbidity.  The differential diagnosis includes medication induced hypoglycemia, stress, infection, lab error -Co morbidities that complicate the patient evaluation includes diabetes, obesity, fibromyalgia, hypertension -Treatment includes IV fluid, insulin -Reevaluation of the patient after these medicines showed that the patient improved -PCP office notes or outside notes reviewed -Escalation to admission/observation considered: patients feels much better, is comfortable with discharge, and will follow up with PCP -Prescription medication considered, patient comfortable with home medication -Social Determinant of Health considered   CBG improved to 226 after IV fluid and insulin.  Patient felt much better and request to go home.         Final Clinical  Impression(s) / ED Diagnoses Final diagnoses:  Hyperglycemia    Rx / DC  Orders ED Discharge Orders     None         Fayrene Helper, PA-C 12/07/23 1605    Margarita Grizzle, MD 12/09/23 1556

## 2023-12-07 NOTE — ED Notes (Signed)
 Dc instructions reviewed with patient. Patient voiced understanding. Dc with belongings.

## 2023-12-07 NOTE — ED Triage Notes (Signed)
 Pt via pov from home with hyperglycemia; states she is having spikes in glucose level since she got a cortisone shot the first of the week in her shoulder. Pt takes jardiance and ozempic for her diabetes. Pt alert & oriented, nad noted.

## 2023-12-09 ENCOUNTER — Ambulatory Visit: Payer: Self-pay | Admitting: Internal Medicine

## 2023-12-09 MED ORDER — LANTUS SOLOSTAR 100 UNIT/ML ~~LOC~~ SOPN: 8.0000 [IU] | PEN_INJECTOR | Freq: Every day | SUBCUTANEOUS | 0 refills | Status: DC

## 2023-12-09 MED ORDER — INSULIN PEN NEEDLE 32G X 6 MM MISC: 0 refills | Status: DC

## 2023-12-09 NOTE — Telephone Encounter (Signed)
 Called pt and relayed MD instructions and advise. Advised pt of insulin pen that was sent in and to keep a record of her sugars and update [place] on how they are doing. Pt verbalized understanding

## 2023-12-09 NOTE — Telephone Encounter (Signed)
 At her next visit we will discuss increasing the Ozempic.  The quickest and easiest way to get sugar down is to do a once a day long-acting insulin this would be using a pen to inject herself once a day until her sugars start to come down and then she will no longer need this.  She will need to update [place] with her sugars so that we can adjust the dose if needed.  The further she gets from the steroid injection to lower her sugars will get.  I sent the insulin pen to her pharmacy.  She needs to have the pharmacist show her how to use this.  She needs to be monitoring her sugars closely once her sugars get to 160 she should stop the insulin.  She will start with 8 units once a day.

## 2023-12-09 NOTE — Telephone Encounter (Signed)
 Reason for Disposition  [1] Caller has URGENT medication or insulin pump question AND [2] triager unable to answer question  Answer Assessment - Initial Assessment Questions 1. BLOOD GLUCOSE: "What is your blood glucose level?"      Pt calling in.  444 was Friday.   Thur. 492.     I went to the ED Sat.  And it was 333.   My CBC 356 glucose.   Gave me 10 units of insuline and a bag of IV fluid  because I was very dehydrated.     The steroid shot I got is making my sugar high.  The steroid shot is for joint pain.   I got the shot on Tues. Morning in my shoulder.  Dr. Lawerance Bach said to let her know if she was on steroids so she could order something to off set the steroid shot. 2. ONSET: "When did you check the blood glucose?"     1:30 AM 197 was last test.   I'm not on insulin   I'm not on insulin.     3. USUAL RANGE: "What is your glucose level usually?" (e.g., usual fasting morning value, usual evening value)     Normal 90-120 in the mornings 4. KETONES: "Do you check for ketones (urine or blood test strips)?" If Yes, ask: "What does the test show now?"      Not asked 5. TYPE 1 or 2:  "Do you know what type of diabetes you have?"  (e.g., Type 1, Type 2, Gestational; doesn't know)      Type 2 6. INSULIN: "Do you take insulin?" "What type of insulin(s) do you use? What is the mode of delivery? (syringe, pen; injection or pump)?"      No 7. DIABETES PILLS: "Do you take any pills for your diabetes?" If Yes, ask: "Have you missed taking any pills recently?"     Yes 8. OTHER SYMPTOMS: "Do you have any symptoms?" (e.g., fever, frequent urination, difficulty breathing, dizziness, weakness, vomiting)     Went to ED because of being so thirsty and vision very blurry.   I was not in DKA in the ED.    9. PREGNANCY: "Is there any chance you are pregnant?" "When was your last menstrual period?"     Not asked  Protocols used: Diabetes - High Blood Sugar-A-AH  Chief Complaint: Glucose elevated in 400s from  having a steroid shot in her shoulder Tues. Morning.   Dr. Lawerance Bach advised her to call for medication adjustment any time she was given steroids.   She takes 5 mg daily of prednisone for her lung disease.  Glucose at 1:30 AM 197.   Not checked it this morning.  Seeking advice from Dr. Lawerance Bach so not end up back at the ED.    Symptoms: Went to ED Sat. Due to extreme fatigue, blurry vision and thirst. Frequency: daily since Steroid shot on Tues. Pertinent Negatives: Patient denies dizziness and all now but instructed to follow up with Dr. Lawerance Bach Disposition: [] ED /[] Urgent Care (no appt availability in office) / [] Appointment(In office/virtual)/ []  [facility]/ [] Home Care/ [] Refused Recommended Disposition /[]  Mobile Bus/ [x]  Follow-up with PCP Additional Notes: High priority message sent to Dr. Lawerance Bach.  Pt. Agreeable to someone calling her back.

## 2023-12-09 NOTE — Telephone Encounter (Signed)
"  Glucose elevated in 400s from having a steroid shot in her shoulder Tues. Morning.   Dr. Lawerance Bach advised her to call for medication adjustment any time she was given steroids.   She takes 5 mg daily of prednisone for her lung disease.  Glucose at 1:30 AM 197.   Not checked it this morning.  Seeking advice from Dr. Lawerance Bach"

## 2023-12-09 NOTE — Telephone Encounter (Signed)
 This RN first attempt to contact patient for triage. No answer, voicemail left requesting return call to clinic.

## 2023-12-09 NOTE — Addendum Note (Signed)
 Addended by: Pincus Sanes on: 12/09/2023 12:28 PM   Modules accepted: Orders

## 2023-12-11 DIAGNOSIS — M25611 Stiffness of right shoulder, not elsewhere classified: Secondary | ICD-10-CM | POA: Diagnosis not present

## 2023-12-11 DIAGNOSIS — M7541 Impingement syndrome of right shoulder: Secondary | ICD-10-CM | POA: Diagnosis not present

## 2023-12-11 DIAGNOSIS — M6281 Muscle weakness (generalized): Secondary | ICD-10-CM | POA: Diagnosis not present

## 2023-12-11 DIAGNOSIS — S46011D Strain of muscle(s) and tendon(s) of the rotator cuff of right shoulder, subsequent encounter: Secondary | ICD-10-CM | POA: Diagnosis not present

## 2023-12-12 ENCOUNTER — Other Ambulatory Visit: Payer: Self-pay | Admitting: Internal Medicine

## 2023-12-12 NOTE — Telephone Encounter (Signed)
 Please advise if appropriate to continue prednisone

## 2023-12-22 DIAGNOSIS — J709 Respiratory conditions due to unspecified external agent: Secondary | ICD-10-CM | POA: Diagnosis not present

## 2023-12-24 DIAGNOSIS — S46011D Strain of muscle(s) and tendon(s) of the rotator cuff of right shoulder, subsequent encounter: Secondary | ICD-10-CM | POA: Diagnosis not present

## 2023-12-24 DIAGNOSIS — M6281 Muscle weakness (generalized): Secondary | ICD-10-CM | POA: Diagnosis not present

## 2023-12-24 DIAGNOSIS — M7541 Impingement syndrome of right shoulder: Secondary | ICD-10-CM | POA: Diagnosis not present

## 2023-12-24 DIAGNOSIS — M25611 Stiffness of right shoulder, not elsewhere classified: Secondary | ICD-10-CM | POA: Diagnosis not present

## 2023-12-29 ENCOUNTER — Encounter: Payer: Self-pay | Admitting: Internal Medicine

## 2023-12-29 NOTE — Patient Instructions (Addendum)
     Your A1c is 10.6%    Medications changes include :   increase ozempic to 1 mg weekly.  Increase lantus to 15 mg daily   Let me know your sugars in one week    Return in about 3 months (around 03/30/2024) for follow up.

## 2023-12-29 NOTE — Progress Notes (Unsigned)
 Subjective:    Patient ID: [name], female    DOB: [birth year], 53 y.o.   MRN: [id number]     HPI [name] is here for follow up of her chronic medical problems.  Has had four days when her sugar has been 450-500.  It was related to a steroid injection she just had.    Today glucose was 179.    A little exercise.  She does feel like she is eating better.  She is tolerating the Ozempic.  Medications and allergies reviewed with patient and updated if appropriate.  Current Outpatient Medications on File Prior to Visit  Medication Sig Dispense Refill   Accu-Chek Softclix Lancets lancets Use to check blood sugars twice a day 100 each 12   albuterol (PROVENTIL) (2.5 MG/3ML) 0.083% nebulizer solution Take 3 mLs (2.5 mg total) by nebulization every 6 (six) hours as needed for wheezing or shortness of breath. 150 mL 2   albuterol (VENTOLIN HFA) 108 (90 Base) MCG/ACT inhaler Inhale 2 puffs into the lungs every 4 (four) hours as needed for wheezing or shortness of breath. 1 each 0   aspirin EC 81 MG tablet Take 81 mg by mouth daily. Swallow whole.     atorvastatin (LIPITOR) 20 MG tablet TAKE 1 TABLET BY MOUTH DAILY ** NEEDS TO MAKE AN APPOINTMENT FOR FUTURE REFILLS ** 90 tablet 0   Blood Glucose Monitoring Suppl (ACCU-CHEK AVIVA PLUS) w/Device KIT Use to check blood sugars twice a day 1 kit 0   Budeson-Glycopyrrol-Formoterol (BREZTRI AEROSPHERE) 160-9-4.8 MCG/ACT AERO Inhale 2 puffs into the lungs in the morning and at bedtime. 10.7 g 12   calcium-vitamin D (OSCAL WITH D) 500-200 MG-UNIT tablet Take 1 tablet by mouth daily.     celecoxib (CELEBREX) 200 MG capsule TAKE ONE CAPSULE BY MOUTH TWICE A DAY 60 capsule 5   Cholecalciferol (VITAMIN D3) 50 MCG (2000 UT) capsule Take 1 capsule (2,000 Units total) by mouth daily. 100 capsule 3   clobetasol ointment (TEMOVATE) 0.05 % Apply 1 Application topically 2 (two) times daily. Use for 3-5 days max per time. 30 g 5    Dextromethorphan-guaiFENesin (MUCINEX DM MAXIMUM STRENGTH) 60-1200 MG TB12 Take 1 tablet by mouth every 12 (twelve) hours as needed (with flutter valve).     ELDERBERRY PO Take 1,000 mg by mouth daily.     fluticasone (FLONASE) 50 MCG/ACT nasal spray Place 1 to 2 sprays in each nostril once a day as needed for stuffy nose. In the right nostril, point the applicator out toward the right ear. In the left nostril, point the applicator out toward the left ear 16 g 5   glucose blood (ACCU-CHEK AVIVA PLUS) test strip USE TO CHECK FOR BLOOD SUGAR TWO TIMES A DAY 100 strip 0   glucose blood (ACCU-CHEK GUIDE TEST) test strip Use as instructed 200 each 12   insulin glargine (LANTUS SOLOSTAR) 100 UNIT/ML Solostar Pen Inject 8 Units into the skin daily. 3 mL 0   Insulin Pen Needle 32G X 6 MM MISC UAD for saxenda injections 30 each 0   JARDIANCE 25 MG TABS tablet TAKE 1 TABLET BY MOUTH EVERY MORNING WITH OR WITHOUT FOOD 90 tablet 0   loratadine (CLARITIN) 10 MG tablet Take 10 mg by mouth at bedtime.     losartan (COZAAR) 25 MG tablet TAKE 1/2 TABLET BY MOUTH DAILY 45 tablet 1   Olopatadine HCl 0.2 % SOLN Place 1 drop in each eye once a day  as needed for itchy watery eyes 2.5 mL 5   OXYGEN 2lpm with sleep and 3lpm with exertion     pantoprazole (PROTONIX) 40 MG tablet TAKE 1 TABLET BY MOUTH TWICE A DAY BEFORE MEALS 180 tablet 0   predniSONE (DELTASONE) 5 MG tablet TAKE 1 TABLET BY MOUTH DAILY 90 tablet 2   repaglinide (PRANDIN) 0.5 MG tablet TAKE TWO TABLETS BY MOUTH THREE TIMES A DAY BEFORE A MEAL 180 tablet 2   Turmeric 500 MG CAPS Take 500 mg by mouth daily.     venlafaxine XR (EFFEXOR-XR) 37.5 MG 24 hr capsule TAKE 1 CAPSULE BY MOUTH DAILY WITH BREAKFAST 90 capsule 2   VITRON-C 65-125 MG TABS Take 1 tablet by mouth daily.     No current facility-administered medications on file prior to visit.     Review of Systems  Constitutional:  Negative for fever.  Respiratory:  Positive for shortness of breath  (related to allergies). Negative for cough and wheezing.   Cardiovascular:  Negative for chest pain, palpitations and leg swelling.  Neurological:  Positive for headaches (occ). Negative for light-headedness.       Objective:   Vitals:   12/30/23 0842  BP: 122/80  Pulse: 90  Temp: 97.6 F (36.4 C)  SpO2: 98%   BP Readings from Last 3 Encounters:  12/30/23 122/80  12/07/23 131/73  10/10/23 120/86   Wt Readings from Last 3 Encounters:  12/30/23 228 lb (103.4 kg)  12/07/23 236 lb 5.3 oz (107.2 kg)  10/10/23 236 lb 4.8 oz (107.2 kg)   Body mass index is 44.53 kg/m.    Physical Exam Constitutional:      General: She is not in acute distress.    Appearance: Normal appearance.  HENT:     Head: Normocephalic and atraumatic.  Eyes:     Conjunctiva/sclera: Conjunctivae normal.  Cardiovascular:     Rate and Rhythm: Normal rate and regular rhythm.     Heart sounds: Normal heart sounds.  Pulmonary:     Effort: Pulmonary effort is normal. No respiratory distress.     Breath sounds: Normal breath sounds. No wheezing.  Musculoskeletal:     Cervical back: Neck supple.     Right lower leg: No edema.     Left lower leg: No edema.  Lymphadenopathy:     Cervical: No cervical adenopathy.  Skin:    General: Skin is warm and dry.     Findings: No rash.  Neurological:     Mental Status: She is alert. Mental status is at baseline.  Psychiatric:        Mood and Affect: Mood normal.        Behavior: Behavior normal.       Diabetic Foot Exam - Simple   Simple Foot Form Diabetic Foot exam was performed with the following findings: Yes 12/30/2023  9:33 AM  Visual Inspection No deformities, no ulcerations, no other skin breakdown bilaterally: Yes Sensation Testing Intact to touch and monofilament testing bilaterally: Yes Pulse Check Posterior Tibialis and Dorsalis pulse intact bilaterally: Yes Comments      Lab Results  Component Value Date   WBC 8.3 12/07/2023   HGB 15.0  12/07/2023   HCT 46.3 (H) 12/07/2023   PLT 205 12/07/2023   GLUCOSE 357 (H) 12/07/2023   CHOL 171 05/30/2023   TRIG 66.0 05/30/2023   HDL 46.40 05/30/2023   LDLDIRECT 153.1 12/08/2012   LDLCALC 111 (H) 05/30/2023   ALT 23 05/30/2023   AST 25  05/30/2023   NA 133 (L) 12/07/2023   K 4.2 12/07/2023   CL 97 (L) 12/07/2023   CREATININE 0.90 12/07/2023   BUN 19 12/07/2023   CO2 28 12/07/2023   TSH 1.19 06/07/2022   INR 1.01 01/30/2017   HGBA1C 10.6 (A) 12/30/2023   MICROALBUR <0.7 05/30/2023     Assessment & Plan:    See Problem List for Assessment and Plan of chronic medical problems.

## 2023-12-30 ENCOUNTER — Ambulatory Visit (INDEPENDENT_AMBULATORY_CARE_PROVIDER_SITE_OTHER): Payer: Medicare Other | Admitting: Internal Medicine

## 2023-12-30 VITALS — BP 122/80 | HR 90 | Temp 97.6°F | Ht 60.0 in | Wt 228.0 lb

## 2023-12-30 DIAGNOSIS — F419 Anxiety disorder, unspecified: Secondary | ICD-10-CM | POA: Diagnosis not present

## 2023-12-30 DIAGNOSIS — Z794 Long term (current) use of insulin: Secondary | ICD-10-CM

## 2023-12-30 DIAGNOSIS — E785 Hyperlipidemia, unspecified: Secondary | ICD-10-CM | POA: Diagnosis not present

## 2023-12-30 DIAGNOSIS — E1165 Type 2 diabetes mellitus with hyperglycemia: Secondary | ICD-10-CM

## 2023-12-30 DIAGNOSIS — I10 Essential (primary) hypertension: Secondary | ICD-10-CM

## 2023-12-30 DIAGNOSIS — K219 Gastro-esophageal reflux disease without esophagitis: Secondary | ICD-10-CM | POA: Diagnosis not present

## 2023-12-30 LAB — POCT GLYCOSYLATED HEMOGLOBIN (HGB A1C): Hemoglobin A1C: 10.6 % — AB (ref 4.0–5.6)

## 2023-12-30 MED ORDER — DEXCOM G7 SENSOR MISC: 3 refills | Status: AC

## 2023-12-30 MED ORDER — DEXCOM G7 RECEIVER DEVI: 0 refills | Status: AC

## 2023-12-30 MED ORDER — LANTUS SOLOSTAR 100 UNIT/ML ~~LOC~~ SOPN
15.0000 [IU] | PEN_INJECTOR | Freq: Every day | SUBCUTANEOUS | Status: DC
Start: 2023-12-30 — End: 2024-01-13

## 2023-12-30 MED ORDER — SEMAGLUTIDE (1 MG/DOSE) 4 MG/3ML ~~LOC~~ SOPN: 1.0000 mg | PEN_INJECTOR | SUBCUTANEOUS | 0 refills | Status: DC

## 2023-12-30 NOTE — Assessment & Plan Note (Signed)
Chronic Controlled, Stable Continue Effexor 37.5 mg daily 

## 2023-12-30 NOTE — Assessment & Plan Note (Addendum)
 Chronic With hyperglycemia  Lab Results  Component Value Date   HGBA1C 9.4 (H) 05/30/2023   Sugars not ideally controlled Intermittently on prednisone Testing sugars some days Check A1c today Continue Jardiance 25 mg daily, Prandin 1 mg 3 times daily AC, increase Ozempic to 1 mg weekly, increase Lantus to 15 units daily - will adjust based on sugars - advised to send me her sugars after a week Stressed regular exercise, diabetic diet Stressed the importance of weight loss

## 2023-12-30 NOTE — Assessment & Plan Note (Signed)
Chronic GERD controlled Continue protonix 40 mg bid GERD diet Continue weight loss efforts

## 2023-12-30 NOTE — Assessment & Plan Note (Addendum)
 Chronic Stressed weight loss  On Ozempic-will increase dose today and increase after a month if still tolerating well Discussed regular exercise-she is not exercising much and encouraged her to slowly increase what she is doing Diabetic diet, smaller portions

## 2023-12-30 NOTE — Assessment & Plan Note (Signed)
 Chronic Regular exercise and healthy diet encouraged Lipids controlled Continue atorvastatin 20 mg daily

## 2023-12-30 NOTE — Assessment & Plan Note (Addendum)
 Chronic Blood pressure well controlled Continue osartan 12.5 mg daily  EKG - NSR @93  bpm, possible LAE, possible LVH, otherwise normal EKG.  Compared to EKG from 03/2019 there is no change.

## 2023-12-31 DIAGNOSIS — S46011D Strain of muscle(s) and tendon(s) of the rotator cuff of right shoulder, subsequent encounter: Secondary | ICD-10-CM | POA: Diagnosis not present

## 2023-12-31 DIAGNOSIS — M6281 Muscle weakness (generalized): Secondary | ICD-10-CM | POA: Diagnosis not present

## 2023-12-31 DIAGNOSIS — M25611 Stiffness of right shoulder, not elsewhere classified: Secondary | ICD-10-CM | POA: Diagnosis not present

## 2023-12-31 DIAGNOSIS — M7541 Impingement syndrome of right shoulder: Secondary | ICD-10-CM | POA: Diagnosis not present

## 2024-01-05 ENCOUNTER — Other Ambulatory Visit: Payer: Self-pay | Admitting: Internal Medicine

## 2024-01-11 ENCOUNTER — Other Ambulatory Visit: Payer: Self-pay | Admitting: Internal Medicine

## 2024-01-13 ENCOUNTER — Encounter: Payer: Self-pay | Admitting: Internal Medicine

## 2024-01-13 ENCOUNTER — Telehealth: Payer: Self-pay | Admitting: Pharmacy Technician

## 2024-01-13 ENCOUNTER — Other Ambulatory Visit (HOSPITAL_COMMUNITY): Payer: Self-pay

## 2024-01-13 NOTE — Telephone Encounter (Signed)
 Pharmacy Patient Advocate Encounter   Received notification from Patient Advice Request messages that prior authorization for [facility] is required/requested.   Insurance verification completed.   The patient is insured through [facility] .   Per test claim:  LANTUS SOLOSTAR is preferred by the insurance.  If suggested medication is appropriate, Please send in a new RX and discontinue this one. If not, please advise as to why it's not appropriate so that we may request a Prior Authorization. Please note, some preferred medications may still require a PA.  If the suggested medications have not been trialed and there are no contraindications to their use, the PA will not be submitted, as it will not be approved.  BASAGLAR IS NOT ON FORMULARY AND A PLAN BENEFIT EXCLUSION  Copay for Lantus after running test claim is $0.00

## 2024-01-13 NOTE — Telephone Encounter (Signed)
 PA request has been Received. New Encounter has been or will be created for follow up. For additional info see Pharmacy Prior Auth telephone encounter from 01/13/24.

## 2024-01-14 ENCOUNTER — Other Ambulatory Visit (HOSPITAL_COMMUNITY): Payer: Self-pay

## 2024-01-14 DIAGNOSIS — M7541 Impingement syndrome of right shoulder: Secondary | ICD-10-CM | POA: Diagnosis not present

## 2024-01-14 DIAGNOSIS — S46011D Strain of muscle(s) and tendon(s) of the rotator cuff of right shoulder, subsequent encounter: Secondary | ICD-10-CM | POA: Diagnosis not present

## 2024-01-14 MED ORDER — LANTUS SOLOSTAR 100 UNIT/ML ~~LOC~~ SOPN: 15.0000 [IU] | PEN_INJECTOR | Freq: Every day | SUBCUTANEOUS | 0 refills | Status: DC

## 2024-01-14 NOTE — Telephone Encounter (Signed)
 Lantus sent to [name] for 15 units daily which is what I think she is currently taking.  This is similar to the insulin she was using so sugars should remain the same

## 2024-01-15 MED ORDER — FLUCONAZOLE 150 MG PO TABS
150.0000 mg | ORAL_TABLET | Freq: Once | ORAL | 0 refills | Status: AC
Start: 2024-01-15 — End: 2024-01-15

## 2024-01-15 NOTE — Addendum Note (Signed)
 Addended by: Colene Dauphin on: 01/15/2024 07:43 AM   Modules accepted: Orders

## 2024-01-22 DIAGNOSIS — J709 Respiratory conditions due to unspecified external agent: Secondary | ICD-10-CM | POA: Diagnosis not present

## 2024-01-28 ENCOUNTER — Other Ambulatory Visit: Payer: Self-pay | Admitting: Internal Medicine

## 2024-02-04 ENCOUNTER — Other Ambulatory Visit (HOSPITAL_COMMUNITY): Payer: Self-pay

## 2024-02-09 ENCOUNTER — Other Ambulatory Visit: Payer: Self-pay | Admitting: Family

## 2024-02-25 ENCOUNTER — Telehealth: Payer: Self-pay | Admitting: Internal Medicine

## 2024-02-25 NOTE — Telephone Encounter (Signed)
 Rc'd fax from [facility] for OV notes and Stationary O2 concentrator. PT last seen a few days short of today. Will still fwd to Dr. Waymond Hailey for signature.

## 2024-03-04 ENCOUNTER — Telehealth: Admitting: Family Medicine

## 2024-03-04 ENCOUNTER — Encounter: Payer: Self-pay | Admitting: Internal Medicine

## 2024-03-04 DIAGNOSIS — B9689 Other specified bacterial agents as the cause of diseases classified elsewhere: Secondary | ICD-10-CM

## 2024-03-04 DIAGNOSIS — J208 Acute bronchitis due to other specified organisms: Secondary | ICD-10-CM | POA: Diagnosis not present

## 2024-03-04 MED ORDER — BENZONATATE 100 MG PO CAPS: 100.0000 mg | ORAL_CAPSULE | Freq: Three times a day (TID) | ORAL | 0 refills | Status: AC | PRN

## 2024-03-04 MED ORDER — DOXYCYCLINE HYCLATE 100 MG PO TABS: 100.0000 mg | ORAL_TABLET | Freq: Two times a day (BID) | ORAL | 0 refills | Status: AC

## 2024-03-04 MED ORDER — FLUCONAZOLE 150 MG PO TABS: 150.0000 mg | ORAL_TABLET | ORAL | 0 refills | Status: DC

## 2024-03-04 MED ORDER — PREDNISONE 20 MG PO TABS: 40.0000 mg | ORAL_TABLET | Freq: Every day | ORAL | 0 refills | Status: AC

## 2024-03-04 NOTE — Patient Instructions (Signed)
 [name], thank you for joining [name], NP for today's virtual visit.  While this provider is not your primary care provider (PCP), if your PCP is located in our provider database this encounter information will be shared with them immediately following your visit.   A [facility] MyChart account gives you access to today's visit and all your visits, tests, and labs performed at [facility] " click here if you don't have a [facility] MyChart account or go to [email or web address]  Consent: (Patient) [name] provided verbal consent for this virtual visit at the beginning of the encounter.  Current Medications:  Current Outpatient Medications:    benzonatate  (TESSALON ) 100 MG capsule, Take 1 capsule (100 mg total) by mouth 3 (three) times daily as needed for cough., Disp: 30 capsule, Rfl: 0   doxycycline  (VIBRA -TABS) 100 MG tablet, Take 1 tablet (100 mg total) by mouth 2 (two) times daily for 10 days., Disp: 20 tablet, Rfl: 0   fluconazole  (DIFLUCAN ) 150 MG tablet, Take 1 tablet (150 mg total) by mouth as directed. Repeat in 3 days as needed, Disp: 2 tablet, Rfl: 0   predniSONE  (DELTASONE ) 20 MG tablet, Take 2 tablets (40 mg total) by mouth daily with breakfast for 5 days., Disp: 10 tablet, Rfl: 0   Accu-Chek Softclix Lancets lancets, Use to check blood sugars twice a day, Disp: 100 each, Rfl: 12   albuterol  (PROVENTIL ) (2.5 MG/3ML) 0.083% nebulizer solution, Take 3 mLs (2.5 mg total) by nebulization every 6 (six) hours as needed for wheezing or shortness of breath., Disp: 150 mL, Rfl: 2   albuterol  (VENTOLIN  HFA) 108 (90 Base) MCG/ACT inhaler, Inhale 2 puffs into the lungs every 4 (four) hours as needed for wheezing or shortness of breath., Disp: 1 each, Rfl: 0   aspirin  EC 81 MG tablet, Take 81 mg by mouth daily. Swallow whole., Disp: , Rfl:    atorvastatin  (LIPITOR) 20 MG tablet, TAKE 1 TABLET BY MOUTH DAILY **NEEDS TO MAKE AN APPOINTMENT FOR FUTURE REFILLS**,  Disp: 90 tablet, Rfl: 0   Blood Glucose Monitoring Suppl (ACCU-CHEK AVIVA PLUS) w/Device KIT, Use to check blood sugars twice a day, Disp: 1 kit, Rfl: 0   Budeson-Glycopyrrol-Formoterol  (BREZTRI  AEROSPHERE) 160-9-4.8 MCG/ACT AERO, Inhale 2 puffs into the lungs in the morning and at bedtime., Disp: 10.7 g, Rfl: 12   calcium -vitamin D (OSCAL WITH D) 500-200 MG-UNIT tablet, Take 1 tablet by mouth daily., Disp: , Rfl:    celecoxib  (CELEBREX ) 200 MG capsule, TAKE ONE CAPSULE BY MOUTH TWICE A DAY, Disp: 60 capsule, Rfl: 5   Cholecalciferol (VITAMIN D3) 50 MCG (2000 UT) capsule, Take 1 capsule (2,000 Units total) by mouth daily., Disp: 100 capsule, Rfl: 3   clobetasol  ointment (TEMOVATE ) 0.05 %, Apply 1 Application topically 2 (two) times daily. Use for 3-5 days max per time., Disp: 30 g, Rfl: 5   Continuous Glucose Receiver (DEXCOM G7 RECEIVER) DEVI, UAD to monitor sugars, IDDM, E11.9, Disp: 1 each, Rfl: 0   Continuous Glucose Sensor (DEXCOM G7 SENSOR) MISC, UAD to monitor sugars, IDDM  E11.9, Disp: 9 each, Rfl: 3   Dextromethorphan -guaiFENesin  (MUCINEX  DM MAXIMUM STRENGTH) 60-1200 MG TB12, Take 1 tablet by mouth every 12 (twelve) hours as needed (with flutter valve)., Disp: , Rfl:    ELDERBERRY PO, Take 1,000 mg by mouth daily., Disp: , Rfl:    fluticasone  (FLONASE ) 50 MCG/ACT nasal spray, INSTILL 1 TO 2 SPRAYS INTO BOTH NOSTRILS AS NEEDED FOR STUFFY NOSE **IN  THE RIGHT NOSTRIL, POINT APPLICATOR OUT TOWARD THE RIGHT EAR AND IN THE LEFT NOSTRIL, POINT APPLICATIOR TOWARD THE LEFT EAR**, Disp: 16 mL, Rfl: 5   glucose blood (ACCU-CHEK AVIVA PLUS) test strip, USE TO CHECK FOR BLOOD SUGAR TWO TIMES A DAY, Disp: 100 strip, Rfl: 0   glucose blood (ACCU-CHEK GUIDE TEST) test strip, Use as instructed, Disp: 200 each, Rfl: 12   insulin  glargine (LANTUS  SOLOSTAR) 100 UNIT/ML Solostar Pen, Inject 15 Units into the skin daily., Disp: 15 mL, Rfl: 0   Insulin  Pen Needle 32G X 6 MM MISC, UAD for saxenda injections, Disp:  30 each, Rfl: 0   JARDIANCE  25 MG TABS tablet, TAKE 1 TABLET BY MOUTH EVERY MORNING WITH OR WITHOUT FOOD, Disp: 90 tablet, Rfl: 0   loratadine  (CLARITIN ) 10 MG tablet, Take 10 mg by mouth at bedtime., Disp: , Rfl:    losartan  (COZAAR ) 25 MG tablet, TAKE 1/2 TABLET BY MOUTH DAILY, Disp: 45 tablet, Rfl: 1   Olopatadine  HCl 0.2 % SOLN, Place 1 drop in each eye once a day as needed for itchy watery eyes, Disp: 2.5 mL, Rfl: 5   OXYGEN , 2lpm with sleep and 3lpm with exertion, Disp: , Rfl:    OZEMPIC , 1 MG/DOSE, 4 MG/3ML SOPN, DIAL AND INJECT UNDER THE SKIN 1 MG WEEKLY, Disp: 3 mL, Rfl: 0   pantoprazole  (PROTONIX ) 40 MG tablet, TAKE 1 TABLET BY MOUTH TWICE A DAY BEFORE MEALS, Disp: 180 tablet, Rfl: 0   predniSONE  (DELTASONE ) 5 MG tablet, TAKE 1 TABLET BY MOUTH DAILY, Disp: 90 tablet, Rfl: 2   repaglinide  (PRANDIN ) 0.5 MG tablet, TAKE TWO TABLETS BY MOUTH THREE TIMES A DAY BEFORE A MEAL, Disp: 180 tablet, Rfl: 2   Turmeric 500 MG CAPS, Take 500 mg by mouth daily., Disp: , Rfl:    venlafaxine  XR (EFFEXOR -XR) 37.5 MG 24 hr capsule, TAKE 1 CAPSULE BY MOUTH DAILY WITH BREAKFAST, Disp: 90 capsule, Rfl: 2   VITRON-C 65-125 MG TABS, Take 1 tablet by mouth daily., Disp: , Rfl:    Medications ordered in this encounter:  Meds ordered this encounter  Medications   predniSONE  (DELTASONE ) 20 MG tablet    Sig: Take 2 tablets (40 mg total) by mouth daily with breakfast for 5 days.    Dispense:  10 tablet    Refill:  0    Supervising Provider:   Corine Dice [[id number]]   doxycycline  (VIBRA -TABS) 100 MG tablet    Sig: Take 1 tablet (100 mg total) by mouth 2 (two) times daily for 10 days.    Dispense:  20 tablet    Refill:  0    Supervising Provider:   LAMPTEY, PHILIP O [[id number]]   fluconazole  (DIFLUCAN ) 150 MG tablet    Sig: Take 1 tablet (150 mg total) by mouth as directed. Repeat in 3 days as needed    Dispense:  2 tablet    Refill:  0    Supervising Provider:   LAMPTEY, PHILIP O [[id number]]   benzonatate   (TESSALON ) 100 MG capsule    Sig: Take 1 capsule (100 mg total) by mouth 3 (three) times daily as needed for cough.    Dispense:  30 capsule    Refill:  0    Supervising Provider:   Corine Dice [[id number]]     *If you need refills on other medications prior to your next appointment, please contact your pharmacy*  Follow-Up: Call back or seek an in-person evaluation if the symptoms worsen or  if the condition fails to improve as anticipated.  [facility] [phone]  Other Instructions   - Take meds as prescribed - Rest voice - Use a cool mist humidifier especially during the winter months when heat dries out the air. - Use saline nose sprays frequently to help soothe nasal passages if they are drying out. - Stay hydrated by drinking plenty of fluids - Keep thermostat turn down low to prevent drying out which can cause a dry cough.  -24-48 hour if not improve you will need a follow up visit in person ASAP    If you have been instructed to have an in-person evaluation today at a local Urgent Care facility, please use the link below. It will take you to a list of all of our available [facility], including address, phone number and hours of operation. Please do not delay care.  [facility]  If you or a family member do not have a primary care provider, use the link below to schedule a visit and establish care. When you choose a [facility] primary care physician or advanced practice provider, you gain a long-term partner in health. Find a Primary Care Provider  Learn more about [facility] in-office and virtual care options: [facility] - Get Care Now

## 2024-03-04 NOTE — Progress Notes (Signed)
 Virtual Visit Consent   [name], you are scheduled for a virtual visit with a [facility] provider today. Just as with appointments in the office, your consent must be obtained to participate. Your consent will be active for this visit and any virtual visit you may have with one of our providers in the next 365 days. If you have a MyChart account, a copy of this consent can be sent to you electronically.  As this is a virtual visit, video technology does not allow for your provider to perform a traditional examination. This may limit your provider's ability to fully assess your condition. If your provider identifies any concerns that need to be evaluated in person or the need to arrange testing (such as labs, EKG, etc.), we will make arrangements to do so. Although advances in technology are sophisticated, we cannot ensure that it will always work on either your end or our end. If the connection with a video visit is poor, the visit may have to be switched to a telephone visit. With either a video or telephone visit, we are not always able to ensure that we have a secure connection.  By engaging in this virtual visit, you consent to the provision of healthcare and authorize for your insurance to be billed (if applicable) for the services provided during this visit. Depending on your insurance coverage, you may receive a charge related to this service.  I need to obtain your verbal consent now. Are you willing to proceed with your visit today? [name] has provided verbal consent on 03/04/2024 for a virtual visit (video or telephone). [name], NP  Date: 03/04/2024 10:44 AM   Virtual Visit via Video Note   I, [name], connected with  [name]  ([id number], Nov 19, 1970) on 03/04/24 at 10:45 AM EDT by a video-enabled telemedicine application and verified that I am speaking with the correct person using two identifiers.  Location: Patient: Virtual Visit Location Patient:  Home Provider: Virtual Visit Location Provider: Home Office   I discussed the limitations of evaluation and management by telemedicine and the availability of in person appointments. The patient expressed understanding and agreed to proceed.    History of Present Illness: [name] is a 53 y.o. who identifies as a female who was assigned female at birth, and is being seen today for sore throat that has progressed   Onset was 5 days ago with sore throat, that has progressed to a cough with mucus with a yellow coloring Associated symptoms are wheezing, cough persistent and both dry and wet, increased shortness of breath, nasal congestion, oxygen  at home is 94-95 (usually at 98-99)  Modifying factors are nebs treatment, prednisone  normally 5 mg daily, upped it to 20 mg (reports feeling a tad better from that increasing), does have oxygen  at home if need Denies chest pain, shortness of breath, fevers, chills  Exposure to sick contacts- unknown COVID test: negative x 2    Problems:  Patient Active Problem List   Diagnosis Date Noted   Temporomandibular joint (TMJ) pain 11/28/2022   Urinary frequency 06/28/2021   Change in stool 06/28/2021   Perianal irritation 06/28/2021   Adenomatous polyp of transverse colon    On home oxygen  therapy 03/27/2021   Genital herpes 04/11/2020   History of nephrolithiasis 10/13/2019   Anxiety 02/05/2018   Peroneal tendinitis of lower leg, right 10/22/2017   Chronic respiratory failure with hypoxia (HCC) 07/20/2017   Knee pain, left  02/02/2016   Cough variant asthma 03/09/2015   Diabetes (HCC) 12/09/2012   Dyslipidemia    Acute on chronic respiratory failure with hypoxia (HCC) 04/05/2012   Allergic rhinitis 01/05/2010   Essential hypertension 09/07/2009   URINARY INCONTINENCE, URGE 03/24/2009   CHRONIC RHINITIS 12/23/2008   Morbid obesity due to excess calories (HCC) 10/10/2007   INTERSTITIAL LUNG DISEASE 10/10/2007   GASTROESOPHAGEAL REFLUX  DISEASE 10/10/2007    Allergies:  Allergies  Allergen Reactions   Penicillins Shortness Of Breath, Swelling and Other (See Comments)    Has patient had a PCN reaction causing immediate rash, facial/tongue/throat swelling, SOB or lightheadedness with hypotension: yes Has patient had a PCN reaction causing severe rash involving mucus membranes or skin necrosis: no Has patient had a PCN reaction that required hospitalization no Has patient had a PCN reaction occurring within the last 10 years: no If all of the above answers are "NO", then may proceed with Cephalosporin use.    Metformin  And Related Other (See Comments)    diarrhea   Other Other (See Comments)   Peach Flavoring Agent (Non-Screening) Hives and Other (See Comments)    Fresh peaches only   Levaquin  [Levofloxacin ] Nausea Only    Nausea, bloating   Mounjaro  [Tirzepatide ] Diarrhea    Bloating, diarrhea, extreme gas   Medications:  Current Outpatient Medications:    Accu-Chek Softclix Lancets lancets, Use to check blood sugars twice a day, Disp: 100 each, Rfl: 12   albuterol  (PROVENTIL ) (2.5 MG/3ML) 0.083% nebulizer solution, Take 3 mLs (2.5 mg total) by nebulization every 6 (six) hours as needed for wheezing or shortness of breath., Disp: 150 mL, Rfl: 2   albuterol  (VENTOLIN  HFA) 108 (90 Base) MCG/ACT inhaler, Inhale 2 puffs into the lungs every 4 (four) hours as needed for wheezing or shortness of breath., Disp: 1 each, Rfl: 0   aspirin  EC 81 MG tablet, Take 81 mg by mouth daily. Swallow whole., Disp: , Rfl:    atorvastatin  (LIPITOR) 20 MG tablet, TAKE 1 TABLET BY MOUTH DAILY **NEEDS TO MAKE AN APPOINTMENT FOR FUTURE REFILLS**, Disp: 90 tablet, Rfl: 0   Blood Glucose Monitoring Suppl (ACCU-CHEK AVIVA PLUS) w/Device KIT, Use to check blood sugars twice a day, Disp: 1 kit, Rfl: 0   Budeson-Glycopyrrol-Formoterol  (BREZTRI  AEROSPHERE) 160-9-4.8 MCG/ACT AERO, Inhale 2 puffs into the lungs in the morning and at bedtime., Disp: 10.7 g,  Rfl: 12   calcium -vitamin D (OSCAL WITH D) 500-200 MG-UNIT tablet, Take 1 tablet by mouth daily., Disp: , Rfl:    celecoxib  (CELEBREX ) 200 MG capsule, TAKE ONE CAPSULE BY MOUTH TWICE A DAY, Disp: 60 capsule, Rfl: 5   Cholecalciferol (VITAMIN D3) 50 MCG (2000 UT) capsule, Take 1 capsule (2,000 Units total) by mouth daily., Disp: 100 capsule, Rfl: 3   clobetasol  ointment (TEMOVATE ) 0.05 %, Apply 1 Application topically 2 (two) times daily. Use for 3-5 days max per time., Disp: 30 g, Rfl: 5   Continuous Glucose Receiver (DEXCOM G7 RECEIVER) DEVI, UAD to monitor sugars, IDDM, E11.9, Disp: 1 each, Rfl: 0   Continuous Glucose Sensor (DEXCOM G7 SENSOR) MISC, UAD to monitor sugars, IDDM  E11.9, Disp: 9 each, Rfl: 3   Dextromethorphan -guaiFENesin  (MUCINEX  DM MAXIMUM STRENGTH) 60-1200 MG TB12, Take 1 tablet by mouth every 12 (twelve) hours as needed (with flutter valve)., Disp: , Rfl:    ELDERBERRY PO, Take 1,000 mg by mouth daily., Disp: , Rfl:    fluticasone  (FLONASE ) 50 MCG/ACT nasal spray, INSTILL 1 TO 2 SPRAYS INTO  BOTH NOSTRILS AS NEEDED FOR STUFFY NOSE **IN THE RIGHT NOSTRIL, POINT APPLICATOR OUT TOWARD THE RIGHT EAR AND IN THE LEFT NOSTRIL, POINT APPLICATIOR TOWARD THE LEFT EAR**, Disp: 16 mL, Rfl: 5   glucose blood (ACCU-CHEK AVIVA PLUS) test strip, USE TO CHECK FOR BLOOD SUGAR TWO TIMES A DAY, Disp: 100 strip, Rfl: 0   glucose blood (ACCU-CHEK GUIDE TEST) test strip, Use as instructed, Disp: 200 each, Rfl: 12   insulin  glargine (LANTUS  SOLOSTAR) 100 UNIT/ML Solostar Pen, Inject 15 Units into the skin daily., Disp: 15 mL, Rfl: 0   Insulin  Pen Needle 32G X 6 MM MISC, UAD for saxenda injections, Disp: 30 each, Rfl: 0   JARDIANCE  25 MG TABS tablet, TAKE 1 TABLET BY MOUTH EVERY MORNING WITH OR WITHOUT FOOD, Disp: 90 tablet, Rfl: 0   loratadine  (CLARITIN ) 10 MG tablet, Take 10 mg by mouth at bedtime., Disp: , Rfl:    losartan  (COZAAR ) 25 MG tablet, TAKE 1/2 TABLET BY MOUTH DAILY, Disp: 45 tablet, Rfl: 1    Olopatadine  HCl 0.2 % SOLN, Place 1 drop in each eye once a day as needed for itchy watery eyes, Disp: 2.5 mL, Rfl: 5   OXYGEN , 2lpm with sleep and 3lpm with exertion, Disp: , Rfl:    OZEMPIC , 1 MG/DOSE, 4 MG/3ML SOPN, DIAL AND INJECT UNDER THE SKIN 1 MG WEEKLY, Disp: 3 mL, Rfl: 0   pantoprazole  (PROTONIX ) 40 MG tablet, TAKE 1 TABLET BY MOUTH TWICE A DAY BEFORE MEALS, Disp: 180 tablet, Rfl: 0   predniSONE  (DELTASONE ) 5 MG tablet, TAKE 1 TABLET BY MOUTH DAILY, Disp: 90 tablet, Rfl: 2   repaglinide  (PRANDIN ) 0.5 MG tablet, TAKE TWO TABLETS BY MOUTH THREE TIMES A DAY BEFORE A MEAL, Disp: 180 tablet, Rfl: 2   Turmeric 500 MG CAPS, Take 500 mg by mouth daily., Disp: , Rfl:    venlafaxine  XR (EFFEXOR -XR) 37.5 MG 24 hr capsule, TAKE 1 CAPSULE BY MOUTH DAILY WITH BREAKFAST, Disp: 90 capsule, Rfl: 2   VITRON-C 65-125 MG TABS, Take 1 tablet by mouth daily., Disp: , Rfl:   Observations/Objective: Patient is well-developed, well-nourished in no acute distress.  Resting comfortably  at home.  Head is normocephalic, atraumatic.  No labored breathing.  Speech is clear and coherent with logical content.  Patient is alert and oriented at baseline.  Constant and wet cough present on video Mild shortness of breath related to coughing spells   Assessment and Plan:  1. Acute bacterial bronchitis (Primary)  - predniSONE  (DELTASONE ) 20 MG tablet; Take 2 tablets (40 mg total) by mouth daily with breakfast for 5 days.  Dispense: 10 tablet; Refill: 0 - doxycycline  (VIBRA -TABS) 100 MG tablet; Take 1 tablet (100 mg total) by mouth 2 (two) times daily for 10 days.  Dispense: 20 tablet; Refill: 0 - fluconazole  (DIFLUCAN ) 150 MG tablet; Take 1 tablet (150 mg total) by mouth as directed. Repeat in 3 days as needed  Dispense: 2 tablet; Refill: 0 - benzonatate  (TESSALON ) 100 MG capsule; Take 1 capsule (100 mg total) by mouth 3 (three) times daily as needed for cough.  Dispense: 30 capsule; Refill: 0   - Take meds as  prescribed - Rest voice - Use a cool mist humidifier especially during the winter months when heat dries out the air. - Use saline nose sprays frequently to help soothe nasal passages if they are drying out. - Stay hydrated by drinking plenty of fluids - Keep thermostat turn down low to prevent drying out which  can cause a dry cough.  -24-48 hour if not improve you will need a follow up visit in person ASAP               Reviewed side effects, risks and benefits of medication.    Patient acknowledged agreement and understanding of the plan.   Past Medical, Surgical, Social History, Allergies, and Medications have been Reviewed.    Follow Up Instructions: I discussed the assessment and treatment plan with the patient. The patient was provided an opportunity to ask questions and all were answered. The patient agreed with the plan and demonstrated an understanding of the instructions.  A copy of instructions were sent to the patient via MyChart unless otherwise noted below.    The patient was advised to call back or seek an in-person evaluation if the symptoms worsen or if the condition fails to improve as anticipated.    [name], NP

## 2024-03-11 ENCOUNTER — Other Ambulatory Visit: Payer: Self-pay | Admitting: Internal Medicine

## 2024-03-11 DIAGNOSIS — Z1231 Encounter for screening mammogram for malignant neoplasm of breast: Secondary | ICD-10-CM

## 2024-03-20 ENCOUNTER — Ambulatory Visit

## 2024-03-20 VITALS — Ht 60.0 in | Wt 239.0 lb

## 2024-03-20 DIAGNOSIS — Z5941 Food insecurity: Secondary | ICD-10-CM

## 2024-03-20 DIAGNOSIS — Z5912 Inadequate housing utilities: Secondary | ICD-10-CM

## 2024-03-20 DIAGNOSIS — Z5986 Financial insecurity: Secondary | ICD-10-CM

## 2024-03-20 DIAGNOSIS — Z0000 Encounter for general adult medical examination without abnormal findings: Secondary | ICD-10-CM | POA: Diagnosis not present

## 2024-03-20 NOTE — Progress Notes (Addendum)
 Subjective:   [name] is a 53 y.o. who presents for a Medicare Wellness preventive visit.  As a reminder, Annual Wellness Visits don't include a physical exam, and some assessments may be limited, especially if this visit is performed virtually. We may recommend an in-person follow-up visit with your provider if needed.  Visit Complete: Virtual I connected with  [name] on 03/20/24 by a video and audio enabled telemedicine application and verified that I am speaking with the correct person using two identifiers.  Patient Location: Home  Provider Location: Office/Clinic  I discussed the limitations of evaluation and management by telemedicine. The patient expressed understanding and agreed to proceed.  Vital Signs: Because this visit was a virtual/telehealth visit, some criteria may be missing or patient reported. Any vitals not documented were not able to be obtained and vitals that have been documented are patient reported.  Persons Participating in Visit: Patient.  AWV Questionnaire: No: Patient Medicare AWV questionnaire was not completed prior to this visit.  Cardiac Risk Factors include: advanced age (>28men, >11 women);diabetes mellitus;dyslipidemia;hypertension;obesity (BMI >30kg/m2)     Objective:    Today's Vitals   03/20/24 0818  Weight: 239 lb (108.4 kg)  Height: 5' (1.524 m)   Body mass index is 46.68 kg/m.     03/20/2024    8:17 AM 12/07/2023   12:47 PM 07/18/2022   10:06 AM 07/17/2021    1:10 PM 04/17/2021    8:36 AM 04/11/2020   11:19 AM 12/29/2019    8:16 AM  Advanced Directives  Does Patient Have a Medical Advance Directive? No No No No No Yes No  Type of Careers adviser;Living will   Does patient want to make changes to medical advance directive?      No - Patient declined   Copy of Healthcare Power of Attorney in Chart?      No - copy requested   Would patient like information on creating a medical advance  directive? Yes (MAU/Ambulatory/Procedural Areas - Information given)  No - Patient declined No - Patient declined No - Patient declined  No - Patient declined    Current Medications (verified) Outpatient Encounter Medications as of 03/20/2024  Medication Sig   Accu-Chek Softclix Lancets lancets Use to check blood sugars twice a day   albuterol  (PROVENTIL ) (2.5 MG/3ML) 0.083% nebulizer solution Take 3 mLs (2.5 mg total) by nebulization every 6 (six) hours as needed for wheezing or shortness of breath.   albuterol  (VENTOLIN  HFA) 108 (90 Base) MCG/ACT inhaler Inhale 2 puffs into the lungs every 4 (four) hours as needed for wheezing or shortness of breath.   aspirin  EC 81 MG tablet Take 81 mg by mouth daily. Swallow whole.   atorvastatin  (LIPITOR) 20 MG tablet TAKE 1 TABLET BY MOUTH DAILY **NEEDS TO MAKE AN APPOINTMENT FOR FUTURE REFILLS**   Blood Glucose Monitoring Suppl (ACCU-CHEK AVIVA PLUS) w/Device KIT Use to check blood sugars twice a day   Budeson-Glycopyrrol-Formoterol  (BREZTRI  AEROSPHERE) 160-9-4.8 MCG/ACT AERO Inhale 2 puffs into the lungs in the morning and at bedtime.   calcium -vitamin D (OSCAL WITH D) 500-200 MG-UNIT tablet Take 1 tablet by mouth daily.   celecoxib  (CELEBREX ) 200 MG capsule TAKE ONE CAPSULE BY MOUTH TWICE A DAY   Cholecalciferol (VITAMIN D3) 50 MCG (2000 UT) capsule Take 1 capsule (2,000 Units total) by mouth daily.   clobetasol  ointment (TEMOVATE ) 0.05 % Apply 1 Application topically 2 (two) times daily.  Use for 3-5 days max per time.   Continuous Glucose Receiver (DEXCOM G7 RECEIVER) DEVI UAD to monitor sugars, IDDM, E11.9   Continuous Glucose Sensor (DEXCOM G7 SENSOR) MISC UAD to monitor sugars, IDDM  E11.9   Dextromethorphan -guaiFENesin  (MUCINEX  DM MAXIMUM STRENGTH) 60-1200 MG TB12 Take 1 tablet by mouth every 12 (twelve) hours as needed (with flutter valve).   ELDERBERRY PO Take 1,000 mg by mouth daily.   fluconazole  (DIFLUCAN ) 150 MG tablet Take 1 tablet (150 mg  total) by mouth as directed. Repeat in 3 days as needed   fluticasone  (FLONASE ) 50 MCG/ACT nasal spray INSTILL 1 TO 2 SPRAYS INTO BOTH NOSTRILS AS NEEDED FOR STUFFY NOSE **IN THE RIGHT NOSTRIL, POINT APPLICATOR OUT TOWARD THE RIGHT EAR AND IN THE LEFT NOSTRIL, POINT APPLICATIOR TOWARD THE LEFT EAR**   glucose blood (ACCU-CHEK AVIVA PLUS) test strip USE TO CHECK FOR BLOOD SUGAR TWO TIMES A DAY   glucose blood (ACCU-CHEK GUIDE TEST) test strip Use as instructed   insulin  glargine (LANTUS  SOLOSTAR) 100 UNIT/ML Solostar Pen Inject 15 Units into the skin daily.   Insulin  Pen Needle 32G X 6 MM MISC UAD for saxenda injections   JARDIANCE  25 MG TABS tablet TAKE 1 TABLET BY MOUTH EVERY MORNING WITH OR WITHOUT FOOD   loratadine  (CLARITIN ) 10 MG tablet Take 10 mg by mouth at bedtime.   losartan  (COZAAR ) 25 MG tablet TAKE 1/2 TABLET BY MOUTH DAILY   Olopatadine  HCl 0.2 % SOLN Place 1 drop in each eye once a day as needed for itchy watery eyes   OXYGEN  2lpm with sleep and 3lpm with exertion   OZEMPIC , 1 MG/DOSE, 4 MG/3ML SOPN DIAL AND INJECT UNDER THE SKIN 1 MG WEEKLY   pantoprazole  (PROTONIX ) 40 MG tablet TAKE 1 TABLET BY MOUTH TWICE A DAY BEFORE MEALS   predniSONE  (DELTASONE ) 5 MG tablet TAKE 1 TABLET BY MOUTH DAILY   repaglinide  (PRANDIN ) 0.5 MG tablet TAKE TWO TABLETS BY MOUTH THREE TIMES A DAY BEFORE A MEAL   Turmeric 500 MG CAPS Take 500 mg by mouth daily.   venlafaxine  XR (EFFEXOR -XR) 37.5 MG 24 hr capsule TAKE 1 CAPSULE BY MOUTH DAILY WITH BREAKFAST   VITRON-C 65-125 MG TABS Take 1 tablet by mouth daily.   benzonatate  (TESSALON ) 100 MG capsule Take 1 capsule (100 mg total) by mouth 3 (three) times daily as needed for cough.   No facility-administered encounter medications on file as of 03/20/2024.    Allergies (verified) Penicillins, Metformin  and related, Other, Peach flavoring agent (non-screening), Levaquin  [levofloxacin ], and Mounjaro  [tirzepatide ]   History: Past Medical History:   Diagnosis Date   Allergic rhinitis    Arthritis    Knees ankles  feet   Bronchitis    Chronic rhinitis    Cough    Dyslipidemia    Dysphagia    Dyspnea    occationally over last 2 years   Fibromyalgia    GERD (gastroesophageal reflux disease)    Headache(784.0)    Hyperlipidemia    ILD (interstitial lung disease) (HCC)    Mild depression    Morbid obesity (HCC)    Type II or diabetes mellitus, uncontrolled 11/2012 dx   Dx 12/08/12: a1c 10.0   Unspecified essential hypertension    Urge incontinence    Past Surgical History:  Procedure Laterality Date   ABDOMINAL HYSTERECTOMY     BILATERAL VATS ABLATION  02/15/04   BIOPSY  04/17/2021   Procedure: BIOPSY;  Surgeon: Lindle Rhea, MD;  Location: [facility];  Service: Gastroenterology;;   CESAREAN SECTION     COLONOSCOPY WITH PROPOFOL  N/A 04/17/2021   Procedure: COLONOSCOPY WITH PROPOFOL ;  Surgeon: Lindle Rhea, MD;  Location: [facility] ENDOSCOPY;  Service: Gastroenterology;  Laterality: N/A;   KNEE ARTHROSCOPY WITH MEDIAL MENISECTOMY Right 04/01/2019   Procedure: KNEE ARTHROSCOPY WITH MEDIAL MENISECTOMY;  Surgeon: Micheline Ahr, MD;  Location: [facility] ORS;  Service: Orthopedics;  Laterality: Right;   KNEE ARTHROSCOPY WITH MEDIAL MENISECTOMY Left 01/06/2020   Procedure: KNEE ARTHROSCOPY WITH MEDIAL MENISECTOMY;  Surgeon: Micheline Ahr, MD;  Location: [facility] ORS;  Service: Orthopedics;  Laterality: Left;   LUNG BIOPSY     RIGHT HEART CATH N/A 01/30/2017   Procedure: Right Heart Cath;  Surgeon: Darlis Eisenmenger, MD;  Location: [facility] INVASIVE CV LAB;  Service: Cardiovascular;  Laterality: N/A;   Family History  Problem Relation Age of Onset   Asthma Mother    Sarcoidosis Mother        dxed by transbrochial bx   Allergies Mother    Heart disease Father    Diabetes Father    Allergies Father    Coronary artery disease Father    Asthma Sister    Cancer Maternal Grandmother        CA of unknown type   Cancer Paternal Grandmother        CA of  unknown type   Colon cancer Neg Hx    Esophageal cancer Neg Hx    Rectal cancer Neg Hx    Stomach cancer Neg Hx    Social History   Socioeconomic History   Marital status: Divorced    Spouse name: Not on file   Number of children: 1   Years of education: Not on file   Highest education level: Not on file  Occupational History   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    Comment: Works in [employer] and on her feet all day  Tobacco Use   Smoking status: Never   Smokeless tobacco: Never   Tobacco comments:    {  Vaping Use   Vaping status: Never Used  Substance and Sexual Activity   Alcohol use: No    Comment: maybe 2 times per year   Drug use: No   Sexual activity: Not Currently  Other Topics Concern   Not on file  Social History Narrative   Not on file   Social Drivers of Health   Financial Resource Strain: High Risk (03/20/2024)   Overall Financial Resource Strain (CARDIA)    Difficulty of Paying Living Expenses: Very hard  Food Insecurity: Food Insecurity Present (03/20/2024)   Hunger Vital Sign    Worried About Running Out of Food in the Last Year: Often true    Ran Out of Food in the Last Year: Often true  Transportation Needs: No Transportation Needs (03/20/2024)   PRAPARE - Administrator, Civil Service (Medical): No    Lack of Transportation (Non-Medical): No  Physical Activity: Sufficiently Active (03/20/2024)   Exercise Vital Sign    Days of Exercise per Week: 5 days    Minutes of Exercise per Session: 30 min  Stress: Stress Concern Present (03/20/2024)   [employer] of Occupational Health - Occupational Stress Questionnaire    Feeling of Stress: Very much  Social Connections: Moderately Integrated (03/20/2024)   Social Connection and Isolation Panel    Frequency of Communication with Friends and Family: More than three times a week    Frequency of Social Gatherings  with Friends and Family: More than three times a week    Attends Religious  Services: More than 4 times per year    Active Member of Clubs or Organizations: Yes    Attends Engineer, structural: More than 4 times per year    Marital Status: Separated    Tobacco Counseling Counseling given: No Tobacco comments: {    Clinical Intake:  Pre-visit preparation completed: Yes  Pain : No/denies pain     BMI - recorded: 46.68 Nutritional Status: BMI > 30  Obese Nutritional Risks: None Diabetes: Yes CBG done?: No Did pt. bring in CBG monitor from home?: No  Lab Results  Component Value Date   HGBA1C 10.6 (A) 12/30/2023   HGBA1C 9.4 (H) 05/30/2023   HGBA1C 9.3 (H) 03/01/2023     How often do you need to have someone help you when you read instructions, pamphlets, or other written materials from your doctor or pharmacy?: 1 - Never  Interpreter Needed?: No  Information entered by :: Kandy Orris, CMA   Activities of Daily Living     03/20/2024    8:21 AM  In your present state of health, do you have any difficulty performing the following activities:  Hearing? 0  Vision? 0  Difficulty concentrating or making decisions? 0  Walking or climbing stairs? 0  Dressing or bathing? 0  Doing errands, shopping? 0  Preparing Food and eating ? N  Using the Toilet? N  In the past six months, have you accidently leaked urine? Y  Comment wears a pad  Do you have problems with loss of bowel control? N  Managing your Medications? N  Managing your Finances? N  Housekeeping or managing your Housekeeping? N    Patient Care Team: Colene Dauphin, MD as PCP - General (Internal Medicine) Diamond Formica, MD (Pulmonary Disease) Buck Carbon, [facility] [place], [place] as Referring Physician (Optometry)  I have updated your Care Teams any recent Medical Services you may have received from other providers in the past year.     Assessment:   This is a routine wellness examination for Devyne.  Hearing/Vision screen Hearing Screening - Comments:: Denies hearing difficulties    Vision Screening - Comments:: Wears rx glasses - up to date with routine eye exams with [facility]   Goals Addressed               This Visit's Progress     Weight (lb) < 200 lb (90.7 kg) (pt-stated)   239 lb (108.4 kg)     Patient stated she wants to lose weight and manage Hgb A1C       Depression Screen     03/20/2024    8:23 AM 12/30/2023    9:01 AM 11/28/2022    8:48 AM 06/07/2022   10:52 AM 07/17/2021    1:19 PM 04/11/2020   11:16 AM 11/08/2017   10:06 AM  PHQ 2/9 Scores  PHQ - 2 Score 3 0 0 0 0 0 1  PHQ- 9 Score 5  0    4    Fall Risk     03/20/2024    8:22 AM 12/30/2023    9:01 AM 11/28/2022    8:07 AM 07/18/2022   10:07 AM 06/07/2022   10:52 AM  Fall Risk   Falls in the past year? 0 1 0 0 0  Number falls in past yr: 0 0 0 0 0  Injury with Fall? 0 1 0 0 0  Risk for fall due to : No Fall Risks No Fall Risks No Fall Risks No Fall Risks No Fall Risks  Follow up Falls evaluation completed;Falls prevention discussed Falls evaluation completed Falls evaluation completed Falls prevention discussed  Falls evaluation completed      Data saved with a previous flowsheet row definition    MEDICARE RISK AT HOME:  Medicare Risk at Home Any stairs in or around the home?: No If so, are there any without handrails?: No Home free of loose throw rugs in walkways, pet beds, electrical cords, etc?: Yes Adequate lighting in your home to reduce risk of falls?: Yes Life alert?: No Use of a cane, walker or w/c?: No Grab bars in the bathroom?: Yes Shower chair or bench in shower?: No Elevated toilet seat or a handicapped toilet?: No  TIMED UP AND GO:  Was the test performed?  No  Cognitive Function: 6CIT completed        03/20/2024    8:23 AM 07/18/2022   10:20 AM  6CIT Screen  What Year? 0 points 0 points  What month? 0 points 0 points  What time? 0 points 0 points  Count back from 20 0 points 0 points  Months in reverse 0 points 0 points  Repeat phrase 0 points  0 points  Total Score 0 points 0 points    Immunizations Immunization History  Administered Date(s) Administered   Influenza Split 06/21/2011, 07/29/2012   Influenza Whole 07/22/2009   Influenza, Seasonal, Injecte, Preservative Fre 07/06/2023   Influenza,inj,Quad PF,6+ Mos 06/15/2013, 08/19/2014, 09/19/2015, 08/31/2016, 08/09/2017, 09/03/2018, 06/09/2019, 08/17/2020, 08/19/2022   Moderna Sars-Covid-2 Vaccination 10/24/2019, 11/28/2019, 06/20/2020   Pneumococcal Conjugate-13 09/19/2015   Pneumococcal Polysaccharide-23 07/22/2009   Td 03/24/2009    Screening Tests Health Maintenance  Topic Date Due   DTaP/Tdap/Td (2 - Tdap) 03/25/2019   OPHTHALMOLOGY EXAM  10/05/2021   Pneumococcal Vaccine 66-48 Years old (3 of 3 - PCV20 or PCV21) 03/29/2022   COVID-19 Vaccine (4 - 2024-25 season) 06/02/2023   Zoster Vaccines- Shingrix (1 of 2) 03/30/2024 (Originally 03/29/2022)   INFLUENZA VACCINE  05/01/2024   Diabetic kidney evaluation - Urine ACR  05/29/2024   HEMOGLOBIN A1C  06/30/2024   Diabetic kidney evaluation - eGFR measurement  12/06/2024   MAMMOGRAM  12/16/2024   FOOT EXAM  12/29/2024   Medicare Annual Wellness (AWV)  03/20/2025   Colonoscopy  04/18/2031   Hepatitis C Screening  Completed   HIV Screening  Completed   HPV VACCINES  Aged Out   Meningococcal B Vaccine  Aged Out    Health Maintenance  Health Maintenance Due  Topic Date Due   DTaP/Tdap/Td (2 - Tdap) 03/25/2019   OPHTHALMOLOGY EXAM  10/05/2021   Pneumococcal Vaccine 35-68 Years old (3 of 3 - PCV20 or PCV21) 03/29/2022   COVID-19 Vaccine (4 - 2024-25 season) 06/02/2023   Health Maintenance Items Addressed: Mammogram scheduled, Referral for financial assistance for medication, utilities, counseling, etc.(Currently separated - going through a Divorce)  Additional Screening:  Vision Screening: Recommended annual ophthalmology exams for early detection of glaucoma and other disorders of the eye. Would you like a  referral to an eye doctor? No  Patient plans to have an eye exam at [facility] in 2025.  Dental Screening: Recommended annual dental exams for proper oral hygiene  Community Resource Referral / Chronic Care Management: CRR required this visit?  Yes   CCM required this visit?  No   Plan:    I have personally  reviewed and noted the following in the patient's chart:   Medical and social history Use of alcohol, tobacco or illicit drugs  Current medications and supplements including opioid prescriptions. Patient is not currently taking opioid prescriptions. Functional ability and status Nutritional status Physical activity Advanced directives List of other physicians Hospitalizations, surgeries, and ER visits in previous 12 months Vitals Screenings to include cognitive, depression, and falls Referrals and appointments  In addition, I have reviewed and discussed with patient certain preventive protocols, quality metrics, and best practice recommendations. A written personalized care plan for preventive services as well as general preventive health recommendations were provided to patient.   Patria Bookbinder, CMA   03/20/2024   After Visit Summary: (MyChart) Due to this being a telephonic visit, the after visit summary with patients personalized plan was offered to patient via MyChart   Notes:  Mammogram scheduled, Referral for financial assistance for medication, utilities, counseling, etc.

## 2024-03-20 NOTE — Patient Instructions (Signed)
 Ms. Coiner , Thank you for taking time out of your busy schedule to complete your Annual Wellness Visit with me. I enjoyed our conversation and look forward to speaking with you again next year. I, as well as your care team,  appreciate your ongoing commitment to your health goals. Please review the following plan we discussed and let me know if I can assist you in the future. Your Game plan/ To Do List    Referrals: If you haven't heard from the office you've been referred to, please reach out to them at the phone provided.  Referral for financial assistance w/medications, utilities, counseling, etc Follow up Visits: Next Medicare AWV with our clinical staff: 03/23/2025   Have you seen your provider in the last 6 months (3 months if uncontrolled diabetes)? Yes Next Office Visit with your provider: 03/30/2024  Clinician Recommendations:  Aim for 30 minutes of exercise or brisk walking, 6-8 glasses of water, and 5 servings of fruits and vegetables each day. Educated and advised to get Tdap (Tetenus), COVID, and Pneumonia vaccines at local pharmacy.        This is a list of the screening recommended for you and due dates:  Health Maintenance  Topic Date Due   DTaP/Tdap/Td vaccine (2 - Tdap) 03/25/2019   Eye exam for diabetics  10/05/2021   Pneumococcal Vaccination (3 of 3 - PCV20 or PCV21) 03/29/2022   COVID-19 Vaccine (4 - 2024-25 season) 06/02/2023   Zoster (Shingles) Vaccine (1 of 2) 03/30/2024*   Flu Shot  05/01/2024   Yearly kidney health urinalysis for diabetes  05/29/2024   Hemoglobin A1C  06/30/2024   Yearly kidney function blood test for diabetes  12/06/2024   Mammogram  12/16/2024   Complete foot exam   12/29/2024   Medicare Annual Wellness Visit  03/20/2025   Colon Cancer Screening  04/18/2031   Hepatitis C Screening  Completed   HIV Screening  Completed   HPV Vaccine  Aged Out   Meningitis B Vaccine  Aged Out  *Topic was postponed. The date shown is not the original due date.     Advanced directives: (Provided) Advance directive discussed with you today. I have provided a copy for you to complete at home and have notarized. Once this is complete, please bring a copy in to our office so we can scan it into your chart.  Advance Care Planning is important because it:  [x]  Makes sure you receive the medical care that is consistent with your values, goals, and preferences  [x]  It provides guidance to your family and loved ones and reduces their decisional burden about whether or not they are making the right decisions based on your wishes.  Follow the link provided in your after visit summary or read over the paperwork we have mailed to you to help you started getting your Advance Directives in place. If you need assistance in completing these, please reach out to us  so that we can help you!

## 2024-03-29 ENCOUNTER — Encounter: Payer: Self-pay | Admitting: Internal Medicine

## 2024-03-29 NOTE — Progress Notes (Unsigned)
 Subjective:    Patient ID: [name], female    DOB: [birth year], 53 y.o.   MRN: [id number]     HPI [name] is here for follow up of her chronic medical problems.  Increase Ozempic  to 2 Increase Lantus   Medications and allergies reviewed with patient and updated if appropriate.  Current Outpatient Medications on File Prior to Visit  Medication Sig Dispense Refill   Accu-Chek Softclix Lancets lancets Use to check blood sugars twice a day 100 each 12   albuterol  (PROVENTIL ) (2.5 MG/3ML) 0.083% nebulizer solution Take 3 mLs (2.5 mg total) by nebulization every 6 (six) hours as needed for wheezing or shortness of breath. 150 mL 2   albuterol  (VENTOLIN  HFA) 108 (90 Base) MCG/ACT inhaler Inhale 2 puffs into the lungs every 4 (four) hours as needed for wheezing or shortness of breath. 1 each 0   aspirin  EC 81 MG tablet Take 81 mg by mouth daily. Swallow whole.     atorvastatin  (LIPITOR) 20 MG tablet TAKE 1 TABLET BY MOUTH DAILY **NEEDS TO MAKE AN APPOINTMENT FOR FUTURE REFILLS** 90 tablet 0   benzonatate  (TESSALON ) 100 MG capsule Take 1 capsule (100 mg total) by mouth 3 (three) times daily as needed for cough. 30 capsule 0   Blood Glucose Monitoring Suppl (ACCU-CHEK AVIVA PLUS) w/Device KIT Use to check blood sugars twice a day 1 kit 0   Budeson-Glycopyrrol-Formoterol  (BREZTRI  AEROSPHERE) 160-9-4.8 MCG/ACT AERO Inhale 2 puffs into the lungs in the morning and at bedtime. 10.7 g 12   calcium -vitamin D (OSCAL WITH D) 500-200 MG-UNIT tablet Take 1 tablet by mouth daily.     celecoxib  (CELEBREX ) 200 MG capsule TAKE ONE CAPSULE BY MOUTH TWICE A DAY 60 capsule 5   Cholecalciferol (VITAMIN D3) 50 MCG (2000 UT) capsule Take 1 capsule (2,000 Units total) by mouth daily. 100 capsule 3   clobetasol  ointment (TEMOVATE ) 0.05 % Apply 1 Application topically 2 (two) times daily. Use for 3-5 days max per time. 30 g 5   Continuous Glucose Receiver (DEXCOM G7 RECEIVER) DEVI UAD to monitor sugars, IDDM,  E11.9 1 each 0   Continuous Glucose Sensor (DEXCOM G7 SENSOR) MISC UAD to monitor sugars, IDDM  E11.9 9 each 3   Dextromethorphan -guaiFENesin  (MUCINEX  DM MAXIMUM STRENGTH) 60-1200 MG TB12 Take 1 tablet by mouth every 12 (twelve) hours as needed (with flutter valve).     ELDERBERRY PO Take 1,000 mg by mouth daily.     fluconazole  (DIFLUCAN ) 150 MG tablet Take 1 tablet (150 mg total) by mouth as directed. Repeat in 3 days as needed 2 tablet 0   fluticasone  (FLONASE ) 50 MCG/ACT nasal spray INSTILL 1 TO 2 SPRAYS INTO BOTH NOSTRILS AS NEEDED FOR STUFFY NOSE **IN THE RIGHT NOSTRIL, POINT APPLICATOR OUT TOWARD THE RIGHT EAR AND IN THE LEFT NOSTRIL, POINT APPLICATIOR TOWARD THE LEFT EAR** 16 mL 5   glucose blood (ACCU-CHEK AVIVA PLUS) test strip USE TO CHECK FOR BLOOD SUGAR TWO TIMES A DAY 100 strip 0   glucose blood (ACCU-CHEK GUIDE TEST) test strip Use as instructed 200 each 12   insulin  glargine (LANTUS  SOLOSTAR) 100 UNIT/ML Solostar Pen Inject 15 Units into the skin daily. 15 mL 0   Insulin  Pen Needle 32G X 6 MM MISC UAD for saxenda injections 30 each 0   JARDIANCE  25 MG TABS tablet TAKE 1 TABLET BY MOUTH EVERY MORNING WITH OR WITHOUT FOOD 90 tablet 0   loratadine  (CLARITIN ) 10 MG tablet Take 10  mg by mouth at bedtime.     losartan  (COZAAR ) 25 MG tablet TAKE 1/2 TABLET BY MOUTH DAILY 45 tablet 1   Olopatadine  HCl 0.2 % SOLN Place 1 drop in each eye once a day as needed for itchy watery eyes 2.5 mL 5   OXYGEN  2lpm with sleep and 3lpm with exertion     OZEMPIC , 1 MG/DOSE, 4 MG/3ML SOPN DIAL AND INJECT UNDER THE SKIN 1 MG WEEKLY 3 mL 0   pantoprazole  (PROTONIX ) 40 MG tablet TAKE 1 TABLET BY MOUTH TWICE A DAY BEFORE MEALS 180 tablet 0   predniSONE  (DELTASONE ) 5 MG tablet TAKE 1 TABLET BY MOUTH DAILY 90 tablet 2   repaglinide  (PRANDIN ) 0.5 MG tablet TAKE TWO TABLETS BY MOUTH THREE TIMES A DAY BEFORE A MEAL 180 tablet 2   Turmeric 500 MG CAPS Take 500 mg by mouth daily.     venlafaxine  XR (EFFEXOR -XR) 37.5  MG 24 hr capsule TAKE 1 CAPSULE BY MOUTH DAILY WITH BREAKFAST 90 capsule 2   VITRON-C 65-125 MG TABS Take 1 tablet by mouth daily.     No current facility-administered medications on file prior to visit.     Review of Systems     Objective:  There were no vitals filed for this visit. BP Readings from Last 3 Encounters:  12/30/23 122/80  12/07/23 131/73  10/10/23 120/86   Wt Readings from Last 3 Encounters:  03/20/24 239 lb (108.4 kg)  12/30/23 228 lb (103.4 kg)  12/07/23 236 lb 5.3 oz (107.2 kg)   There is no height or weight on file to calculate BMI.    Physical Exam     Lab Results  Component Value Date   WBC 8.3 12/07/2023   HGB 15.0 12/07/2023   HCT 46.3 (H) 12/07/2023   PLT 205 12/07/2023   GLUCOSE 357 (H) 12/07/2023   CHOL 171 05/30/2023   TRIG 66.0 05/30/2023   HDL 46.40 05/30/2023   LDLDIRECT 153.1 12/08/2012   LDLCALC 111 (H) 05/30/2023   ALT 23 05/30/2023   AST 25 05/30/2023   NA 133 (L) 12/07/2023   K 4.2 12/07/2023   CL 97 (L) 12/07/2023   CREATININE 0.90 12/07/2023   BUN 19 12/07/2023   CO2 28 12/07/2023   TSH 1.19 06/07/2022   INR 1.01 01/30/2017   HGBA1C 10.6 (A) 12/30/2023     Assessment & Plan:    See Problem List for Assessment and Plan of chronic medical problems.

## 2024-03-29 NOTE — Patient Instructions (Addendum)
      Blood work was ordered.       Medications changes include :   increase ozempic  to 2 mg weekly    A referral was ordered for diabetic nutrition and someone will call you to schedule an appointment.     Return in about 2 months (around 05/30/2024) for Physical Exam.

## 2024-03-30 ENCOUNTER — Ambulatory Visit (INDEPENDENT_AMBULATORY_CARE_PROVIDER_SITE_OTHER): Admitting: Internal Medicine

## 2024-03-30 ENCOUNTER — Telehealth: Payer: Self-pay | Admitting: *Deleted

## 2024-03-30 VITALS — BP 130/78 | HR 100 | Temp 98.0°F | Ht 60.0 in | Wt 238.0 lb

## 2024-03-30 DIAGNOSIS — G8929 Other chronic pain: Secondary | ICD-10-CM

## 2024-03-30 DIAGNOSIS — M25562 Pain in left knee: Secondary | ICD-10-CM | POA: Diagnosis not present

## 2024-03-30 DIAGNOSIS — I10 Essential (primary) hypertension: Secondary | ICD-10-CM | POA: Diagnosis not present

## 2024-03-30 DIAGNOSIS — E1165 Type 2 diabetes mellitus with hyperglycemia: Secondary | ICD-10-CM

## 2024-03-30 DIAGNOSIS — E785 Hyperlipidemia, unspecified: Secondary | ICD-10-CM | POA: Diagnosis not present

## 2024-03-30 DIAGNOSIS — Z794 Long term (current) use of insulin: Secondary | ICD-10-CM

## 2024-03-30 DIAGNOSIS — K219 Gastro-esophageal reflux disease without esophagitis: Secondary | ICD-10-CM

## 2024-03-30 DIAGNOSIS — F419 Anxiety disorder, unspecified: Secondary | ICD-10-CM

## 2024-03-30 LAB — LIPID PANEL
Cholesterol: 191 mg/dL (ref 0–200)
HDL: 58.5 mg/dL (ref >39.00)
LDL Cholesterol: 119 mg/dL — ABNORMAL HIGH (ref 0–99)
NonHDL: 132.79
Total CHOL/HDL Ratio: 3
Triglycerides: 69 mg/dL (ref 0.0–149.0)
VLDL: 13.8 mg/dL (ref 0.0–40.0)

## 2024-03-30 LAB — COMPREHENSIVE METABOLIC PANEL WITH GFR
ALT: 22 U/L (ref 0–35)
AST: 24 U/L (ref 0–37)
Albumin: 3.9 g/dL (ref 3.5–5.2)
Alkaline Phosphatase: 63 U/L (ref 39–117)
BUN: 16 mg/dL (ref 6–23)
CO2: 27 meq/L (ref 19–32)
Calcium: 9.2 mg/dL (ref 8.4–10.5)
Chloride: 102 meq/L (ref 96–112)
Creatinine, Ser: 0.43 mg/dL (ref 0.40–1.20)
GFR: 112.16 mL/min (ref >60.00)
Glucose, Bld: 160 mg/dL — ABNORMAL HIGH (ref 70–99)
Potassium: 3.6 meq/L (ref 3.5–5.1)
Sodium: 138 meq/L (ref 135–145)
Total Bilirubin: 0.5 mg/dL (ref 0.2–1.2)
Total Protein: 7.5 g/dL (ref 6.0–8.3)

## 2024-03-30 LAB — POCT GLYCOSYLATED HEMOGLOBIN (HGB A1C)
HbA1c POC (<> result, manual entry): 8.3 % (ref 4.0–5.6)
HbA1c, POC (controlled diabetic range): 8.3 % — AB (ref 0.0–7.0)
HbA1c, POC (prediabetic range): 8.3 % — AB (ref 5.7–6.4)
Hemoglobin A1C: 8.3 % — AB (ref 4.0–5.6)

## 2024-03-30 LAB — MICROALBUMIN / CREATININE URINE RATIO
Creatinine,U: 53.8 mg/dL
Microalb Creat Ratio: UNDETERMINED mg/g (ref 0.0–30.0)
Microalb, Ur: <0.7 mg/dL

## 2024-03-30 MED ORDER — SEMAGLUTIDE (2 MG/DOSE) 8 MG/3ML ~~LOC~~ SOPN: 2.0000 mg | PEN_INJECTOR | SUBCUTANEOUS | 1 refills | Status: DC

## 2024-03-30 NOTE — Assessment & Plan Note (Addendum)
 Chronic With hyperglycemia  Lab Results  Component Value Date   HGBA1C 10.6 (A) 12/30/2023   Sugars not ideally controlled Intermittently on prednisone  Check A1c today is 8.3%-much improved Continue Jardiance  25 mg daily, Prandin  1 mg 3 times daily AC, increase Ozempic  to 2 mg weekly, continue Lantus  to 15 units daily  Goal is to get her off the Lantus  Stressed regular exercise, diabetic diet-she is starting to work on this and I think it will be easier now that she is divorced Stressed the importance of weight loss

## 2024-03-30 NOTE — Progress Notes (Unsigned)
 Complex Care Management Note Care Guide Note  03/30/2024 Name: [name] MRN: [id number] DOB: October 17, 1970   Complex Care Management Outreach Attempts: An unsuccessful telephone outreach was attempted today to offer the patient information about available complex care management services.  Follow Up Plan:  Additional outreach attempts will be made to offer the patient complex care management information and services.   Encounter Outcome:  No Answer  Thedford Franks, CMA   [facility], [facility] Guide Direct Dial: [phone]  Fax: [phone] Website: [email or web address]

## 2024-03-30 NOTE — Assessment & Plan Note (Signed)
Chronic Secondary to osteoarthritis Stressed weight loss, regular exercise Continue Celebrex 200 mg twice daily as needed - typically takes it once a day

## 2024-03-30 NOTE — Assessment & Plan Note (Addendum)
 Chronic Controlled, Stable   Seeing a therapist Continue Effexor  37.5 mg daily

## 2024-03-30 NOTE — Assessment & Plan Note (Addendum)
Chronic Blood pressure well controlled CMP Continue losartan 12.5 mg daily 

## 2024-03-30 NOTE — Assessment & Plan Note (Signed)
Chronic GERD controlled Continue protonix 40 mg bid GERD diet Continue weight loss efforts

## 2024-03-30 NOTE — Assessment & Plan Note (Signed)
 Chronic Stressed weight loss  On Ozempic -will increase dose today Discussed regular exercise Diabetic diet, smaller portions

## 2024-03-30 NOTE — Assessment & Plan Note (Addendum)
Chronic °Regular exercise and healthy diet encouraged °Check lipid panel  °Continue atorvastatin 20 mg daily °

## 2024-03-31 ENCOUNTER — Ambulatory Visit: Payer: Self-pay | Admitting: Internal Medicine

## 2024-03-31 NOTE — Progress Notes (Unsigned)
 Complex Care Management Note Care Guide Note  03/31/2024 Name: [name] MRN: [id number] DOB: 1971-03-12   Complex Care Management Outreach Attempts: A second unsuccessful outreach was attempted today to offer the patient with information about available complex care management services.  Follow Up Plan:  Additional outreach attempts will be made to offer the patient complex care management information and services.   Encounter Outcome:  No Answer  Thedford Franks, CMA [place]  [facility], [facility] Guide Direct Dial: [phone]  Fax: [phone] Website: [email or web address]

## 2024-04-01 NOTE — Progress Notes (Signed)
 Complex Care Management Note Care Guide Note  04/01/2024 Name: [name] MRN: [id number] DOB: 08-Jun-1971   Complex Care Management Outreach Attempts: A third unsuccessful outreach was attempted today to offer the patient with information about available complex care management services.  Follow Up Plan:  No further outreach attempts will be made at this time. We have been unable to contact the patient to offer or enroll patient in complex care management services.  Encounter Outcome:  No Answer  Thedford Franks, CMA [place]  [facility], [facility] Guide Direct Dial: [phone]  Fax: [phone] Website: [email or web address]

## 2024-04-04 ENCOUNTER — Other Ambulatory Visit: Payer: Self-pay | Admitting: Internal Medicine

## 2024-04-08 ENCOUNTER — Ambulatory Visit
Admission: RE | Admit: 2024-04-08 | Discharge: 2024-04-08 | Disposition: A | Discharge status: Home / Self Care | Source: Ambulatory Visit | Attending: Internal Medicine | Admitting: Internal Medicine

## 2024-04-08 ENCOUNTER — Other Ambulatory Visit: Payer: Self-pay | Admitting: Internal Medicine

## 2024-04-08 DIAGNOSIS — Z1231 Encounter for screening mammogram for malignant neoplasm of breast: Secondary | ICD-10-CM | POA: Diagnosis not present

## 2024-04-16 ENCOUNTER — Encounter: Payer: Self-pay | Admitting: Allergy & Immunology

## 2024-04-16 ENCOUNTER — Other Ambulatory Visit: Payer: Self-pay

## 2024-04-16 ENCOUNTER — Ambulatory Visit (INDEPENDENT_AMBULATORY_CARE_PROVIDER_SITE_OTHER): Payer: Medicare PPO | Admitting: Allergy & Immunology

## 2024-04-16 VITALS — BP 120/78 | HR 99 | Temp 97.8°F | Resp 16

## 2024-04-16 DIAGNOSIS — W57XXXD Bitten or stung by nonvenomous insect and other nonvenomous arthropods, subsequent encounter: Secondary | ICD-10-CM | POA: Diagnosis not present

## 2024-04-16 DIAGNOSIS — J302 Other seasonal allergic rhinitis: Secondary | ICD-10-CM

## 2024-04-16 DIAGNOSIS — J84112 Idiopathic pulmonary fibrosis: Secondary | ICD-10-CM

## 2024-04-16 DIAGNOSIS — J3089 Other allergic rhinitis: Secondary | ICD-10-CM | POA: Diagnosis not present

## 2024-04-16 MED ORDER — BREZTRI AEROSPHERE 160-9-4.8 MCG/ACT IN AERO: 2.0000 | INHALATION_SPRAY | Freq: Two times a day (BID) | RESPIRATORY_TRACT | 12 refills | Status: DC

## 2024-04-16 NOTE — Patient Instructions (Addendum)
 1. Idiopathic pulmonary fibrosis - Lung testing looked stable today.  - It seems that you have an excellent handle on your symptoms.  - We will continue with the Breztri  and refill the [place] [facility] sample.  - Daily controller medication(s): Breztri  two puffs twice daily + prednisone  5 mg daily - Prior to physical activity: albuterol  2 puffs 10-15 minutes before physical activity. - Rescue medications: albuterol  4 puffs every 4-6 hours as needed - Asthma control goals:  * Full participation in all desired activities (may need albuterol  before activity) * Albuterol  use two time or less a week on average (not counting use with activity) * Cough interfering with sleep two time or less a month * Oral steroids no more than once a year * No hospitalizations  2.  Seasonal and perennial rhinitis/allergic conjunctivitis (dust mites, grasses, ragweed, weeds, trees, and indoor and outdoor molds) - Continue with cetirizine 10mg  daily.  - We will start allergy  shots for long-term control. - Make an appointment to start shots in 3 weeks. - Consent signed today.  - This would help with the postnasal drip and the breathing issues associated with environmental allergies.   3. 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 - Use for 3 - 5 days maximum to see if this can limit the inflammation.  - Refill provided today for the clobetasol .   4. Return in about 6 months (around 10/17/2024). You can have the follow up appointment with Dr. Iva or a Nurse Practicioner (our Nurse Practitioners are excellent and always have Physician oversight!).    Please inform us  of any Emergency Department visits, hospitalizations, or changes in symptoms. Call us  before going to the ED for breathing or allergy  symptoms since we might be able to fit you in for a sick visit. Feel  free to contact us  anytime with any questions, problems, or concerns.  It was a pleasure to see you again today!  Websites that have reliable patient information: 1. American Academy of Asthma, Allergy , and Immunology: www.aaaai.org 2. Food Allergy  Research and Education (FARE): foodallergy.org 3. Mothers of Asthmatics: http://www.asthmacommunitynetwork.org 4. [employer] of Allergy , Asthma, and Immunology: www.acaai.org      "Like" us  on Facebook and Instagram for our latest updates!      A healthy democracy works best when [employer] participate! Make sure you are registered to vote! If you have moved or changed any of your contact information, you will need to get this updated before voting! Scan the QR codes below to learn more!

## 2024-04-16 NOTE — Progress Notes (Signed)
 FOLLOW UP  Date of Service/Encounter:  04/16/24   Assessment:   Idiopathic pulmonary fibrosis - with improved lung function today (maintained on prednisone  5 mg daily)   Doing better clinically on Breztri , therefore likely some component of asthma   41 seasonal allergic rhinitis (dust mites, grasses, ragweed, weeds, trees, and indoor and outdoor molds) - [employer] GERD - on PPO   On disability  Plan/Recommendations:   1. Idiopathic pulmonary fibrosis - Lung testing looked stable today.  - It seems that you have an excellent handle on your symptoms.  - We will continue with the Breztri  and refill the [place] [facility] [place] sample.  - Daily controller medication(s): Breztri  two puffs twice daily + prednisone  5 mg daily - Prior to physical activity: albuterol  2 puffs 10-15 minutes before physical activity. - Rescue medications: albuterol  4 puffs every 4-6 hours as needed - Asthma control goals:  * Full participation in all desired activities (may need albuterol  before activity) * Albuterol  use two time or less a week on average (not counting use with activity) * Cough interfering with sleep two time or less a month * Oral steroids no more than once a year * No hospitalizations  2.  Seasonal and perennial rhinitis/allergic conjunctivitis (dust mites, grasses, ragweed, weeds, trees, and indoor and outdoor molds) - Continue with cetirizine 10mg  daily.  - We will start allergy  shots for long-term control. - Make an appointment to start shots in 3 weeks. - Consent signed today.  - This would help with the postnasal drip and the breathing issues associated with environmental allergies.   3. 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 - Use for 3 - 5 days maximum to see if this can limit the inflammation.  - Refill provided today for the  clobetasol .   4. Return in about 6 months (around 10/17/2024). You can have the follow up appointment with Dr. Iva or a Nurse Practicioner (our Nurse Practitioners are excellent and always have Physician oversight!).   Subjective:   [name] is a 53 y.o. female presenting today for follow up of  Chief Complaint  Patient presents with   Follow-up    No concerns    [name] has a history of the following: Patient Active Problem List   Diagnosis Date Noted   Temporomandibular joint (TMJ) pain 11/28/2022   Urinary frequency 06/28/2021   Change in stool 06/28/2021   Perianal irritation 06/28/2021   Adenomatous polyp of transverse colon    On home oxygen  therapy 03/27/2021   Genital herpes 04/11/2020   History of nephrolithiasis 10/13/2019   Anxiety 02/05/2018   Peroneal tendinitis of lower leg, right 10/22/2017   Chronic respiratory failure with hypoxia (HCC) 07/20/2017   Knee pain, left 02/02/2016   Cough variant asthma 03/09/2015   Diabetes (HCC) 12/09/2012   Dyslipidemia    Acute on chronic respiratory failure with hypoxia (HCC) 04/05/2012   Allergic rhinitis 01/05/2010   Essential hypertension 09/07/2009   URINARY INCONTINENCE, URGE 03/24/2009   CHRONIC RHINITIS 12/23/2008   Morbid obesity due to excess calories (HCC) 10/10/2007   INTERSTITIAL LUNG DISEASE 10/10/2007   GASTROESOPHAGEAL REFLUX DISEASE 10/10/2007    History obtained from: chart review and patient.  Discussed the use of AI scribe software for clinical note transcription with the patient and/or guardian,  who gave verbal consent to proceed.  [name] is a 54 y.o. female presenting for a follow up visit.  She was last seen in January 2025.  At that time, like testing look stable.  We did not make any medication changes.  We continue with Breztri  2 puffs twice daily as well as prednisone  5 mg daily.  For her rhinitis, we will continue with cetirizine.  She continued with the use of clobetasol  after  insect bites to prevent large local reactions.  Since last visit, she has done well.    Asthma/Respiratory Symptom History: Her breathing has been stable with no recent complaints or flares. She maintains a daily dose of 5 mg prednisone , occasionally increasing to 20 mg during flare-ups, particularly in the spring and sometimes in the winter. Her most recent FEV1 was 50%. She uses Breztri , which she receives for free through [facility], and does not require any refills at this time. Her use of the emergency inhaler is minimal, approximately once a month, indicating good control of her symptoms.  Allergic Rhinitis Symptom History: She experiences seasonal allergies, with the worst symptoms occurring in the spring due to pollen and in the summer due to mosquito bites. Mosquito bites cause severe reactions, including redness and heat, but she finds relief using an ointment and Benadryl. She also experiences postnasal drip and ear itching, which she attributes to allergies. 6 she does report a lot of postnasal drip.  Her social history includes a past amicable divorce after nearly thirty years of marriage. She remains friends with her ex-husband, who has significant health issues, including a stroke four years ago.   Otherwise, there have been no changes to her past medical history, surgical history, family history, or social history.    Review of systems otherwise negative other than that mentioned in the HPI.    Objective:   Blood pressure 120/78, pulse 99, temperature 97.8 F (36.6 C), resp. rate 16, SpO2 98%. There is no height or weight on file to calculate BMI.    Physical Exam Vitals reviewed.  Constitutional:      Appearance: She is well-developed.     Comments: Very lovely. Smiling. Personable.   HENT:     Head: Normocephalic and atraumatic.     Right Ear: Tympanic membrane, ear canal and external ear normal. No drainage, swelling or tenderness. Tympanic membrane is not injected,  scarred, erythematous, retracted or bulging.     Left Ear: Tympanic membrane, ear canal and external ear normal. No drainage, swelling or tenderness. Tympanic membrane is not injected, scarred, erythematous, retracted or bulging.     Nose: No nasal deformity, septal deviation, mucosal edema or rhinorrhea.     Right Turbinates: Enlarged, swollen and pale.     Left Turbinates: Enlarged, swollen and pale.     Right Sinus: No maxillary sinus tenderness or frontal sinus tenderness.     Left Sinus: No maxillary sinus tenderness or frontal sinus tenderness.     Mouth/Throat:     Lips: Pink.     Mouth: Mucous membranes are moist. Mucous membranes are not pale and not dry.     Pharynx: Oropharynx is clear. Uvula midline.  Eyes:     General:        Right eye: No discharge.        Left eye: No discharge.     Conjunctiva/sclera: Conjunctivae normal.     Right eye: Right conjunctiva is not injected. No chemosis.    Left eye: Left conjunctiva is not  injected. No chemosis.    Pupils: Pupils are equal, round, and reactive to light.  Cardiovascular:     Rate and Rhythm: Normal rate and regular rhythm.     Heart sounds: Normal heart sounds.  Pulmonary:     Effort: Pulmonary effort is normal. No tachypnea, accessory muscle usage or respiratory distress.     Breath sounds: Examination of the right-lower field reveals wheezing. Examination of the left-lower field reveals wheezing. Wheezing present. No rhonchi or rales.     Comments: Crackles at the bilateral bases. Moving air better today than previous visits.  This appears to be her baseline.  Chest:     Chest wall: No tenderness.  Abdominal:     Tenderness: There is no abdominal tenderness. There is no guarding or rebound.  Lymphadenopathy:     Head:     Right side of head: No submandibular, tonsillar or occipital adenopathy.     Left side of head: No submandibular, tonsillar or occipital adenopathy.     Cervical: No cervical adenopathy.  Skin:     Coloration: Skin is not pale.     Findings: No abrasion, erythema, petechiae or rash. Rash is not papular, urticarial or vesicular.  Neurological:     Mental Status: She is alert.  Psychiatric:        Behavior: Behavior is cooperative.      Diagnostic studies:    Spirometry: results abnormal (FEV1: 1.04/49%, FVC: 1.40/54%, FEV1/FVC: 74%).    Spirometry consistent with possible restrictive disease. This FEV1 is stable.   Allergy  Studies: none        Marty Shaggy, MD  [facility] 

## 2024-04-17 NOTE — Addendum Note (Signed)
 Addended by: NANCEE JON SAILOR on: 04/17/2024 12:06 PM   Modules accepted: Orders

## 2024-04-18 ENCOUNTER — Other Ambulatory Visit: Payer: Self-pay | Admitting: Internal Medicine

## 2024-05-07 NOTE — Telephone Encounter (Signed)
 [name]

## 2024-06-04 ENCOUNTER — Encounter: Payer: Self-pay | Admitting: Internal Medicine

## 2024-06-04 NOTE — Patient Instructions (Addendum)
 Blood work was ordered.       Medications changes include :   increase effexor  to 75 mg daily.  Increase lantus  to 30 units daily.      Return in about 3 months (around 09/04/2024) for follow up.     Health Maintenance, Female Adopting a healthy lifestyle and getting preventive care are important in promoting health and wellness. Ask your health care provider about: The right schedule for you to have regular tests and exams. Things you can do on your own to prevent diseases and keep yourself healthy. What should I know about diet, weight, and exercise? Eat a healthy diet  Eat a diet that includes plenty of vegetables, fruits, low-fat dairy products, and lean protein. Do not eat a lot of foods that are high in solid fats, added sugars, or sodium. Maintain a healthy weight Body mass index (BMI) is used to identify weight problems. It estimates body fat based on height and weight. Your health care provider can help determine your BMI and help you achieve or maintain a healthy weight. Get regular exercise Get regular exercise. This is one of the most important things you can do for your health. Most adults should: Exercise for at least 150 minutes each week. The exercise should increase your heart rate and make you sweat (moderate-intensity exercise). Do strengthening exercises at least twice a week. This is in addition to the moderate-intensity exercise. Spend less time sitting. Even light physical activity can be beneficial. Watch cholesterol and blood lipids Have your blood tested for lipids and cholesterol at 52 years of age, then have this test every 5 years. Have your cholesterol levels checked more often if: Your lipid or cholesterol levels are high. You are older than 52 years of age. You are at high risk for heart disease. What should I know about cancer screening? Depending on your health history and family history, you may need to have cancer screening at various  ages. This may include screening for: Breast cancer. Cervical cancer. Colorectal cancer. Skin cancer. Lung cancer. What should I know about heart disease, diabetes, and high blood pressure? Blood pressure and heart disease High blood pressure causes heart disease and increases the risk of stroke. This is more likely to develop in people who have high blood pressure readings or are overweight. Have your blood pressure checked: Every 3-5 years if you are 48-9 years of age. Every year if you are 70 years old or older. Diabetes Have regular diabetes screenings. This checks your fasting blood sugar level. Have the screening done: Once every three years after age 42 if you are at a normal weight and have a low risk for diabetes. More often and at a younger age if you are overweight or have a high risk for diabetes. What should I know about preventing infection? Hepatitis B If you have a higher risk for hepatitis B, you should be screened for this virus. Talk with your health care provider to find out if you are at risk for hepatitis B infection. Hepatitis C Testing is recommended for: Everyone born from [birth year] through 1964. Anyone with known risk factors for hepatitis C. Sexually transmitted infections (STIs) Get screened for STIs, including gonorrhea and chlamydia, if: You are sexually active and are younger than 52 years of age. You are older than 52 years of age and your health care provider tells you that you are at risk for this type of infection. Your sexual activity has changed  since you were last screened, and you are at increased risk for chlamydia or gonorrhea. Ask your health care provider if you are at risk. Ask your health care provider about whether you are at high risk for HIV. Your health care provider may recommend a prescription medicine to help prevent HIV infection. If you choose to take medicine to prevent HIV, you should first get tested for HIV. You should then be tested  every 3 months for as long as you are taking the medicine. Pregnancy If you are about to stop having your period (premenopausal) and you may become pregnant, seek counseling before you get pregnant. Take 400 to 800 micrograms (mcg) of folic acid every day if you become pregnant. Ask for birth control (contraception) if you want to prevent pregnancy. Osteoporosis and menopause Osteoporosis is a disease in which the bones lose minerals and strength with aging. This can result in bone fractures. If you are 70 years old or older, or if you are at risk for osteoporosis and fractures, ask your health care provider if you should: Be screened for bone loss. Take a calcium  or vitamin D supplement to lower your risk of fractures. Be given hormone replacement therapy (HRT) to treat symptoms of menopause. Follow these instructions at home: Alcohol use Do not drink alcohol if: Your health care provider tells you not to drink. You are pregnant, may be pregnant, or are planning to become pregnant. If you drink alcohol: Limit how much you have to: 0-1 drink a day. Know how much alcohol is in your drink. In the [place] one drink equals one 12 oz bottle of beer (355 mL), one 5 oz glass of wine (148 mL), or one 1 oz glass of hard liquor (44 mL). Lifestyle Do not use any products that contain nicotine or tobacco. These products include cigarettes, chewing tobacco, and vaping devices, such as e-cigarettes. If you need help quitting, ask your health care provider. Do not use street drugs. Do not share needles. Ask your health care provider for help if you need support or information about quitting drugs. General instructions Schedule regular health, dental, and eye exams. Stay current with your vaccines. Tell your health care provider if: You often feel depressed. You have ever been abused or do not feel safe at home. Summary Adopting a healthy lifestyle and getting preventive care are important in promoting  health and wellness. Follow your health care provider's instructions about healthy diet, exercising, and getting tested or screened for diseases. Follow your health care provider's instructions on monitoring your cholesterol and blood pressure. This information is not intended to replace advice given to you by your health care provider. Make sure you discuss any questions you have with your health care provider. Document Revised: 02/06/2021 Document Reviewed: 02/06/2021 Elsevier Patient Education  2024 [employer].

## 2024-06-04 NOTE — Progress Notes (Unsigned)
 Subjective:    Patient ID: [name], female    DOB: 18-May-1971, 53 y.o.   MRN: [id number]      HPI [name] is here for a Physical exam and her chronic medical problems.   She has some family stresses -- children and post divorce changes  Hysterectomy at 26ish dur to fibroids and thinks she is having hot flashes. It started all of a sudden -she gets really hot and no one else is hot-start at top of head and comes down and works itself down.  She gets sweaty.    Sugars about the same - some days forget to take lantus .  Medications and allergies reviewed with patient and updated if appropriate.  Current Outpatient Medications on File Prior to Visit  Medication Sig Dispense Refill   Accu-Chek Softclix Lancets lancets Use to check blood sugars twice a day 100 each 12   albuterol  (PROVENTIL ) (2.5 MG/3ML) 0.083% nebulizer solution Take 3 mLs (2.5 mg total) by nebulization every 6 (six) hours as needed for wheezing or shortness of breath. 150 mL 2   albuterol  (VENTOLIN  HFA) 108 (90 Base) MCG/ACT inhaler Inhale 2 puffs into the lungs every 4 (four) hours as needed for wheezing or shortness of breath. 1 each 0   aspirin  EC 81 MG tablet Take 81 mg by mouth daily. Swallow whole.     atorvastatin  (LIPITOR) 20 MG tablet TAKE 1 TABLET BY MOUTH DAILY **NEEDS TO MAKE AN APPOINTMENT FOR FUTURE REFILLS** 90 tablet 0   benzonatate  (TESSALON ) 100 MG capsule Take 1 capsule (100 mg total) by mouth 3 (three) times daily as needed for cough. 30 capsule 0   Blood Glucose Monitoring Suppl (ACCU-CHEK AVIVA PLUS) w/Device KIT Use to check blood sugars twice a day 1 kit 0   budesonide -glycopyrrolate-formoterol  (BREZTRI  AEROSPHERE) 160-9-4.8 MCG/ACT AERO inhaler Inhale 2 puffs into the lungs in the morning and at bedtime. 10.7 g 12   calcium -vitamin D (OSCAL WITH D) 500-200 MG-UNIT tablet Take 1 tablet by mouth daily.     celecoxib  (CELEBREX ) 200 MG capsule TAKE ONE CAPSULE BY MOUTH TWICE A DAY 60 capsule 5    Cholecalciferol (VITAMIN D3) 50 MCG (2000 UT) capsule Take 1 capsule (2,000 Units total) by mouth daily. 100 capsule 3   clobetasol  ointment (TEMOVATE ) 0.05 % Apply 1 Application topically 2 (two) times daily. Use for 3-5 days max per time. 30 g 5   Continuous Glucose Receiver (DEXCOM G7 RECEIVER) DEVI UAD to monitor sugars, IDDM, E11.9 1 each 0   Continuous Glucose Sensor (DEXCOM G7 SENSOR) MISC UAD to monitor sugars, IDDM  E11.9 9 each 3   Dextromethorphan -guaiFENesin  (MUCINEX  DM MAXIMUM STRENGTH) 60-1200 MG TB12 Take 1 tablet by mouth every 12 (twelve) hours as needed (with flutter valve).     ELDERBERRY PO Take 1,000 mg by mouth daily.     fluticasone  (FLONASE ) 50 MCG/ACT nasal spray INSTILL 1 TO 2 SPRAYS INTO BOTH NOSTRILS AS NEEDED FOR STUFFY NOSE **IN THE RIGHT NOSTRIL, POINT APPLICATOR OUT TOWARD THE RIGHT EAR AND IN THE LEFT NOSTRIL, POINT APPLICATIOR TOWARD THE LEFT EAR** 16 mL 5   glucose blood (ACCU-CHEK AVIVA PLUS) test strip USE TO CHECK FOR BLOOD SUGAR TWO TIMES A DAY 100 strip 0   glucose blood (ACCU-CHEK GUIDE TEST) test strip Use as instructed 200 each 12   Insulin  Pen Needle 32G X 6 MM MISC UAD for saxenda injections 30 each 0   JARDIANCE  25 MG TABS tablet TAKE 1 TABLET  BY MOUTH DAILY IN THE MORNING WITH OR WITHOUT FOOD 90 tablet 0   LANTUS  SOLOSTAR 100 UNIT/ML Solostar Pen INJECT 15 UNITS INTO THE SKIN DAILY 15 mL 0   loratadine  (CLARITIN ) 10 MG tablet Take 10 mg by mouth at bedtime.     losartan  (COZAAR ) 25 MG tablet TAKE A HALF TABLET BY MOUTH DAILY 45 tablet 1   Olopatadine  HCl 0.2 % SOLN Place 1 drop in each eye once a day as needed for itchy watery eyes 2.5 mL 5   OXYGEN  2lpm with sleep and 3lpm with exertion     pantoprazole  (PROTONIX ) 40 MG tablet TAKE 1 TABLET BY MOUTH TWICE A DAY BEFORE MEALS 180 tablet 0   predniSONE  (DELTASONE ) 5 MG tablet TAKE 1 TABLET BY MOUTH DAILY 90 tablet 2   repaglinide  (PRANDIN ) 0.5 MG tablet TAKE 2 TABLETS BY MOUTH 3 TIMES A DAY BEFORE A MEAL  180 tablet 2   Semaglutide , 2 MG/DOSE, 8 MG/3ML SOPN Inject 2 mg as directed once a week. 6 mL 1   Turmeric 500 MG CAPS Take 500 mg by mouth daily.     venlafaxine  XR (EFFEXOR -XR) 37.5 MG 24 hr capsule TAKE 1 CAPSULE BY MOUTH DAILY WITH BREAKFAST 90 capsule 2   VITRON-C 65-125 MG TABS Take 1 tablet by mouth daily.     No current facility-administered medications on file prior to visit.    Review of Systems  Constitutional:  Negative for fever.       Hot flashes  Eyes:  Negative for visual disturbance.  Respiratory:  Negative for cough, shortness of breath and wheezing.   Cardiovascular:  Positive for chest pain (likely stress related) and palpitations (occ - ? stress). Negative for leg swelling.  Gastrointestinal:  Negative for abdominal pain, blood in stool, constipation and diarrhea.       No gerd  Genitourinary:  Negative for dysuria.  Musculoskeletal:  Positive for arthralgias (mild ankles). Negative for back pain.  Skin:  Negative for rash.  Neurological:  Positive for headaches (sinus). Negative for light-headedness.  Psychiatric/Behavioral:  Negative for dysphoric mood. The patient is nervous/anxious.        Objective:   Vitals:   06/05/24 1302  BP: 128/80  Pulse: 93  Temp: 98.1 F (36.7 C)  SpO2: 98%   Filed Weights   06/05/24 1302  Weight: 239 lb (108.4 kg)   Body mass index is 46.68 kg/m.  BP Readings from Last 3 Encounters:  06/05/24 128/80  04/16/24 120/78  03/30/24 130/78    Wt Readings from Last 3 Encounters:  06/05/24 239 lb (108.4 kg)  03/30/24 238 lb (108 kg)  03/20/24 239 lb (108.4 kg)       Physical Exam Constitutional: She appears well-developed and well-nourished. No distress.  HENT:  Head: Normocephalic and atraumatic.  Right Ear: External ear normal. Normal ear canal and TM Left Ear: External ear normal.  Normal ear canal and TM Mouth/Throat: Oropharynx is clear and moist.  Eyes: Conjunctivae normal.  Neck: Neck supple. No  tracheal deviation present. No thyromegaly present.  No carotid bruit  Cardiovascular: Normal rate, regular rhythm and normal heart sounds.   No murmur heard.  No edema. Pulmonary/Chest: Effort normal and breath sounds normal. No respiratory distress. She has no wheezes. She has no rales.  Breast: deferred   Abdominal: Soft. She exhibits no distension. There is no tenderness.  Lymphadenopathy: She has no cervical adenopathy.  Skin: Skin is warm and dry. She is not diaphoretic.  Psychiatric: She has a normal mood and affect. Her behavior is normal.   Diabetic Foot Exam - Simple   Simple Foot Form Diabetic Foot exam was performed with the following findings: Yes 06/05/2024  1:40 PM  Visual Inspection No deformities, no ulcerations, no other skin breakdown bilaterally: Yes Sensation Testing Intact to touch and monofilament testing bilaterally: Yes Pulse Check Posterior Tibialis and Dorsalis pulse intact bilaterally: Yes Comments      Lab Results  Component Value Date   WBC 8.3 12/07/2023   HGB 15.0 12/07/2023   HCT 46.3 (H) 12/07/2023   PLT 205 12/07/2023   GLUCOSE 160 (H) 03/30/2024   CHOL 191 03/30/2024   TRIG 69.0 03/30/2024   HDL 58.50 03/30/2024   LDLDIRECT 153.1 12/08/2012   LDLCALC 119 (H) 03/30/2024   ALT 22 03/30/2024   AST 24 03/30/2024   NA 138 03/30/2024   K 3.6 03/30/2024   CL 102 03/30/2024   CREATININE 0.43 03/30/2024   BUN 16 03/30/2024   CO2 27 03/30/2024   TSH 1.19 06/07/2022   INR 1.01 01/30/2017   HGBA1C 8.3 (A) 03/30/2024   HGBA1C 8.3 03/30/2024   HGBA1C 8.3 (A) 03/30/2024   HGBA1C 8.3 (A) 03/30/2024   MICROALBUR <0.7 03/30/2024         Assessment & Plan:   Physical exam: Screening blood work  ordered Exercise  once a week maybe Weight  obese - stressed weight loss Substance abuse  none   Reviewed recommended immunizations.   Health Maintenance  Topic Date Due   OPHTHALMOLOGY EXAM  10/05/2021   Zoster Vaccines- Shingrix (1 of 2)  Never done   COVID-19 Vaccine (4 - 2025-26 season) 06/20/2024 (Originally 06/01/2024)   Influenza Vaccine  12/29/2024 (Originally 05/01/2024)   DTaP/Tdap/Td (2 - Tdap) 03/30/2025 (Originally 03/25/2019)   Pneumococcal Vaccine: 50+ Years (3 of 3 - PCV20 or PCV21) 06/05/2025 (Originally 03/29/2022)   Hepatitis B Vaccines 19-59 Average Risk (1 of 3 - 19+ 3-dose series) 06/05/2025 (Originally 03/30/1991)   HEMOGLOBIN A1C  09/29/2024   FOOT EXAM  12/29/2024   Medicare Annual Wellness (AWV)  03/20/2025   Diabetic kidney evaluation - eGFR measurement  03/30/2025   Diabetic kidney evaluation - Urine ACR  03/30/2025   MAMMOGRAM  04/08/2026   Colonoscopy  04/18/2031   Hepatitis C Screening  Completed   HIV Screening  Completed   HPV VACCINES  Aged Out   Meningococcal B Vaccine  Aged Out          See Problem List for Assessment and Plan of chronic medical problems.

## 2024-06-05 ENCOUNTER — Ambulatory Visit (INDEPENDENT_AMBULATORY_CARE_PROVIDER_SITE_OTHER): Admitting: Internal Medicine

## 2024-06-05 VITALS — BP 128/80 | HR 93 | Temp 98.1°F | Ht 60.0 in | Wt 239.0 lb

## 2024-06-05 DIAGNOSIS — K219 Gastro-esophageal reflux disease without esophagitis: Secondary | ICD-10-CM | POA: Diagnosis not present

## 2024-06-05 DIAGNOSIS — G8929 Other chronic pain: Secondary | ICD-10-CM

## 2024-06-05 DIAGNOSIS — Z0000 Encounter for general adult medical examination without abnormal findings: Secondary | ICD-10-CM | POA: Diagnosis not present

## 2024-06-05 DIAGNOSIS — M25562 Pain in left knee: Secondary | ICD-10-CM

## 2024-06-05 DIAGNOSIS — Z794 Long term (current) use of insulin: Secondary | ICD-10-CM

## 2024-06-05 DIAGNOSIS — E785 Hyperlipidemia, unspecified: Secondary | ICD-10-CM

## 2024-06-05 DIAGNOSIS — F419 Anxiety disorder, unspecified: Secondary | ICD-10-CM

## 2024-06-05 DIAGNOSIS — E1169 Type 2 diabetes mellitus with other specified complication: Secondary | ICD-10-CM | POA: Diagnosis not present

## 2024-06-05 LAB — LIPID PANEL
Cholesterol: 160 mg/dL (ref 0–200)
HDL: 52.8 mg/dL (ref >39.00)
LDL Cholesterol: 93 mg/dL (ref 0–99)
NonHDL: 107.52
Total CHOL/HDL Ratio: 3
Triglycerides: 73 mg/dL (ref 0.0–149.0)
VLDL: 14.6 mg/dL (ref 0.0–40.0)

## 2024-06-05 LAB — COMPREHENSIVE METABOLIC PANEL WITH GFR
ALT: 26 U/L (ref 0–35)
AST: 28 U/L (ref 0–37)
Albumin: 4 g/dL (ref 3.5–5.2)
Alkaline Phosphatase: 98 U/L (ref 39–117)
BUN: 17 mg/dL (ref 6–23)
CO2: 29 meq/L (ref 19–32)
Calcium: 9.6 mg/dL (ref 8.4–10.5)
Chloride: 99 meq/L (ref 96–112)
Creatinine, Ser: 0.68 mg/dL (ref 0.40–1.20)
GFR: 100.3 mL/min (ref >60.00)
Glucose, Bld: 238 mg/dL — ABNORMAL HIGH (ref 70–99)
Potassium: 3.8 meq/L (ref 3.5–5.1)
Sodium: 138 meq/L (ref 135–145)
Total Bilirubin: 0.3 mg/dL (ref 0.2–1.2)
Total Protein: 7.8 g/dL (ref 6.0–8.3)

## 2024-06-05 LAB — HEMOGLOBIN A1C: Hgb A1c MFr Bld: 9.6 % — ABNORMAL HIGH (ref 4.6–6.5)

## 2024-06-05 MED ORDER — LANTUS SOLOSTAR 100 UNIT/ML ~~LOC~~ SOPN: 25.0000 [IU] | PEN_INJECTOR | Freq: Every day | SUBCUTANEOUS | 3 refills | Status: DC

## 2024-06-05 MED ORDER — VENLAFAXINE HCL ER 75 MG PO CP24
75.0000 mg | ORAL_CAPSULE | Freq: Every day | ORAL | 1 refills | Status: AC
Start: 2024-06-05 — End: ?

## 2024-06-05 NOTE — Assessment & Plan Note (Addendum)
 Chronic Not controlled Seeing a therapist Increase Effexor  to 75  mg daily

## 2024-06-05 NOTE — Assessment & Plan Note (Signed)
Chronic GERD controlled Continue protonix 40 mg bid GERD diet Continue weight loss efforts

## 2024-06-05 NOTE — Assessment & Plan Note (Signed)
 Chronic Stressed weight loss  On Ozempic  2 mg weekly Discussed regular exercise Diabetic diet, smaller portions

## 2024-06-05 NOTE — Assessment & Plan Note (Addendum)
 Chronic With hyperglycemia, obesity  Lab Results  Component Value Date   HGBA1C 8.3 (A) 03/30/2024   HGBA1C 8.3 03/30/2024   HGBA1C 8.3 (A) 03/30/2024   HGBA1C 8.3 (A) 03/30/2024   Sugars not ideally controlled Intermittently on prednisone  Has had increased stress and has forgotten to take take the Lantus  intermittently-getting several alarms on her Dexcom Check A1c today  Continue Jardiance  25 mg daily, Ozempic  to 2 mg weekly, increase Lantus  to 30 units daily (currently taking 25.  Stop Prandin  which is likely not helping Stressed the importance of weight loss

## 2024-06-05 NOTE — Assessment & Plan Note (Signed)
Chronic Secondary to osteoarthritis Stressed weight loss, regular exercise Continue Celebrex 200 mg twice daily as needed - typically takes it once a day

## 2024-06-05 NOTE — Assessment & Plan Note (Signed)
 Chronic Regular exercise and healthy diet encouraged Check lipid panel  Continue atorvastatin  20 mg daily

## 2024-06-06 ENCOUNTER — Ambulatory Visit: Payer: Self-pay | Admitting: Internal Medicine

## 2024-06-09 ENCOUNTER — Other Ambulatory Visit: Payer: Self-pay | Admitting: Internal Medicine

## 2024-06-21 ENCOUNTER — Other Ambulatory Visit: Payer: Self-pay | Admitting: Internal Medicine

## 2024-07-18 ENCOUNTER — Other Ambulatory Visit: Payer: Self-pay | Admitting: Internal Medicine

## 2024-07-24 ENCOUNTER — Other Ambulatory Visit: Payer: Self-pay | Admitting: Internal Medicine

## 2024-07-31 ENCOUNTER — Encounter: Payer: Self-pay | Admitting: Pharmacist

## 2024-07-31 NOTE — Progress Notes (Signed)
 Pharmacy Quality Measure Review  This patient is appearing on a report for being at risk of failing the adherence measure for cholesterol (statin) medications this calendar year.   Medication: Atorvastatin  Last fill date: 07/20/24 for 90 day supply  Insurance report was not up to date. No action needed at this time.    Darrelyn Drum, PharmD, BCPS, CPP Clinical Pharmacist Practitioner [facility] [phone]

## 2024-08-03 ENCOUNTER — Other Ambulatory Visit: Payer: Self-pay | Admitting: Internal Medicine

## 2024-09-03 ENCOUNTER — Telehealth: Payer: Self-pay | Admitting: Pharmacist

## 2024-09-03 DIAGNOSIS — E119 Type 2 diabetes mellitus without complications: Secondary | ICD-10-CM

## 2024-09-03 NOTE — Progress Notes (Signed)
 Pharmacy Quality Measure Review  This patient is appearing on a report for being at risk of failing the Glycemic Status Assessment in Diabetes measure this calendar year.   Last documented A1c or GMI 9.6 on 06/05/24  Contacted patient to  request acces to her Dexcom data to assess her recent BG control. Pt agrees to connect using [facility] in her Dexcom app. Connection was successful and shows that she has had improvement in her BG since September with 90 day GMI of 7.5%    Pt notes she has come down with an illness and will be unable to make it to her appt with Dr. Geofm tomorrow morning. She thought she had cancelled it, but it is still scheduled in her chart. Assisted her with rescheduling her PCP appt to 09/18/24.  Darrelyn Drum, PharmD, BCPS, CPP Clinical Pharmacist Practitioner [facility] [phone]

## 2024-09-04 ENCOUNTER — Ambulatory Visit: Admitting: Internal Medicine

## 2024-09-05 ENCOUNTER — Emergency Department (HOSPITAL_COMMUNITY)
Admission: EM | Admit: 2024-09-05 | Discharge: 2024-09-05 | Disposition: A | Discharge status: Home / Self Care | Attending: Emergency Medicine | Admitting: Emergency Medicine

## 2024-09-05 DIAGNOSIS — A059 Bacterial foodborne intoxication, unspecified: Secondary | ICD-10-CM

## 2024-09-05 LAB — CBC
HCT: 47.9 % — ABNORMAL HIGH (ref 36.0–46.0)
Hemoglobin: 15.1 g/dL — ABNORMAL HIGH (ref 12.0–15.0)
MCH: 29.4 pg (ref 26.0–34.0)
MCHC: 31.5 g/dL (ref 30.0–36.0)
MCV: 93.2 fL (ref 80.0–100.0)
Platelets: 209 K/uL (ref 150–400)
RBC: 5.14 MIL/uL — ABNORMAL HIGH (ref 3.87–5.11)
RDW: 16 % — ABNORMAL HIGH (ref 11.5–15.5)
WBC: 12.5 K/uL — ABNORMAL HIGH (ref 4.0–10.5)
nRBC: 0 % (ref 0.0–0.2)

## 2024-09-05 LAB — COMPREHENSIVE METABOLIC PANEL WITH GFR
ALT: 24 U/L (ref 0–44)
AST: 28 U/L (ref 15–41)
Albumin: 3.6 g/dL (ref 3.5–5.0)
Alkaline Phosphatase: 82 U/L (ref 38–126)
Anion gap: 14 (ref 5–15)
BUN: 22 mg/dL — ABNORMAL HIGH (ref 6–20)
CO2: 24 mmol/L (ref 22–32)
Calcium: 9.7 mg/dL (ref 8.9–10.3)
Chloride: 104 mmol/L (ref 98–111)
Creatinine, Ser: 0.84 mg/dL (ref 0.44–1.00)
GFR, Estimated: >60 mL/min (ref >60)
Glucose, Bld: 162 mg/dL — ABNORMAL HIGH (ref 70–99)
Potassium: 4.3 mmol/L (ref 3.5–5.1)
Sodium: 142 mmol/L (ref 135–145)
Total Bilirubin: 0.4 mg/dL (ref 0.0–1.2)
Total Protein: 8 g/dL (ref 6.5–8.1)

## 2024-09-05 LAB — LIPASE, BLOOD: Lipase: 27 U/L (ref 11–51)

## 2024-09-05 MED ORDER — ONDANSETRON HCL 4 MG/2ML IJ SOLN
4.0000 mg | Freq: Once | INTRAMUSCULAR | Status: AC
  Administered 2024-09-05: 4 mg via INTRAVENOUS
  Filled 2024-09-05: qty 2

## 2024-09-05 MED ORDER — LACTATED RINGERS IV BOLUS
1000.0000 mL | Freq: Once | INTRAVENOUS | Status: AC
  Administered 2024-09-05: 1000 mL via INTRAVENOUS

## 2024-09-05 MED ORDER — ONDANSETRON HCL 4 MG PO TABS: 4.0000 mg | ORAL_TABLET | Freq: Four times a day (QID) | ORAL | 0 refills | Status: AC

## 2024-09-05 NOTE — ED Triage Notes (Signed)
 Pt arrives via [facility] for generalized abd pain with N/V/D that started approximately two hours ago. Pt states she was recently at a party that had multiple plates of food brought by multiple people. No other sick contacts or known sickness from the party. Pt given approximately 500 mL LR bolus and 4 mg zofran  enroute by EMS. Pt initially tachy at 130's.

## 2024-09-05 NOTE — ED Provider Notes (Signed)
 [place] EMERGENCY DEPARTMENT AT [facility] Provider Note   CSN: [id number] Arrival date & time: 09/05/24  2043     Patient presents with: Abdominal Pain   [name] is a 53 y.o. female.  History of hysterectomy 25 years ago presenting with sudden onset abdominal pain today.  History per patient.  Endorses developed generalized abdominal pain, with nausea, vomiting, and diarrhea approximate 2 hours prior to arrival.  She reports that she went to a party today, and had food, including banana pudding that was room temperature, fried chicken, and had a large amount of red velvet cake.  She is also on Ozempic , and she ate a lot more than she is normally used to.  Approximately 4 hours after eating this food, patient Dors that she suddenly developed abdominal pain, and then had numerous episodes of vomiting, as well as multiple episodes of diarrhea.  She continued to have these episodes, and felt that she was going to pass out, did not pass out, denies any head injury.  Called EMS, was given 500 cc of fluid, 4 mg Zofran  and route.  Initially tachycardia to 130s, appearing dehydrated with EMS.  No chest pain or shortness of breath associated.  Endorses that she feels much better after receiving Zofran  with EMS.  {Add pertinent medical, surgical, social history, OB history to HPI:32947}  Abdominal Pain      Prior to Admission medications   Medication Sig Start Date End Date Taking? Authorizing Provider  Accu-Chek Softclix Lancets lancets Use to check blood sugars twice a day 06/29/20   Geofm Glade PARAS, MD  albuterol  (PROVENTIL ) (2.5 MG/3ML) 0.083% nebulizer solution Take 3 mLs (2.5 mg total) by nebulization every 6 (six) hours as needed for wheezing or shortness of breath. 01/22/23   Iva Marty Saltness, MD  albuterol  (VENTOLIN  HFA) 108 [phone] Base) MCG/ACT inhaler Inhale 2 puffs into the lungs every 4 (four) hours as needed for wheezing or shortness of breath. 01/22/23   Iva Marty Saltness, MD  aspirin  EC 81 MG tablet Take 81 mg by mouth daily. Swallow whole.    [provider]  atorvastatin  (LIPITOR) 20 MG tablet TAKE 1 TABLET BY MOUTH DAILY *NEED APPOINTMENT TO MAKE FURTHER REFILLS* 07/20/24   Geofm Glade PARAS, MD  benzonatate  (TESSALON ) 100 MG capsule Take 1 capsule (100 mg total) by mouth 3 (three) times daily as needed for cough. 03/04/24   Moishe Chiquita HERO, NP  Blood Glucose Monitoring Suppl (ACCU-CHEK AVIVA PLUS) w/Device KIT Use to check blood sugars twice a day 10/11/23   Geofm Glade PARAS, MD  budesonide -glycopyrrolate-formoterol  (BREZTRI  AEROSPHERE) 160-9-4.8 MCG/ACT AERO inhaler Inhale 2 puffs into the lungs in the morning and at bedtime. 04/16/24   Iva Marty Saltness, MD  calcium -vitamin D (OSCAL WITH D) 500-200 MG-UNIT tablet Take 1 tablet by mouth daily.    [provider]  celecoxib  (CELEBREX ) 200 MG capsule TAKE ONE CAPSULE BY MOUTH TWICE A DAY 10/16/22   Geofm Glade PARAS, MD  Cholecalciferol (VITAMIN D3) 50 MCG (2000 UT) capsule Take 1 capsule (2,000 Units total) by mouth daily. 09/28/20   Plotnikov, Aleksei V, MD  clobetasol  ointment (TEMOVATE ) 0.05 % Apply 1 Application topically 2 (two) times daily. Use for 3-5 days max per time. 10/10/23   Iva Marty Saltness, MD  Continuous Glucose Receiver (DEXCOM G7 RECEIVER) DEVI UAD to monitor sugars, IDDM, E11.9 12/30/23   Geofm Glade PARAS, MD  Continuous Glucose Sensor (DEXCOM G7 SENSOR) MISC UAD to monitor sugars,  IDDM  E11.9 12/30/23   Geofm Glade PARAS, MD  Dextromethorphan -guaiFENesin  (MUCINEX  DM MAXIMUM STRENGTH) 60-1200 MG TB12 Take 1 tablet by mouth every 12 (twelve) hours as needed (with flutter valve).    [provider]  ELDERBERRY PO Take 1,000 mg by mouth daily.    [provider]  fluticasone  (FLONASE ) 50 MCG/ACT nasal spray INSTILL 1 TO 2 SPRAYS INTO BOTH NOSTRILS AS NEEDED FOR STUFFY NOSE **IN THE RIGHT NOSTRIL, POINT APPLICATOR OUT TOWARD THE RIGHT EAR AND IN THE LEFT NOSTRIL, POINT  APPLICATIOR TOWARD THE LEFT EAR** 02/10/24   Iva Marty Saltness, MD  glucose blood (ACCU-CHEK AVIVA PLUS) test strip USE TO CHECK FOR BLOOD SUGAR TWO TIMES A DAY 07/10/21   Geofm Glade PARAS, MD  glucose blood (ACCU-CHEK GUIDE TEST) test strip Use as instructed 11/04/23   Geofm Glade PARAS, MD  Insulin  Pen Needle (TECHLITE PEN NEEDLES) 32G X 6 MM MISC USE AS DIRECTED 06/09/24   Geofm Glade PARAS, MD  JARDIANCE  25 MG TABS tablet TAKE 1 TABLET BY MOUTH EVERY MORNING WITH OR WITHOUT FOOD 07/24/24   Geofm Glade PARAS, MD  LANTUS  SOLOSTAR 100 UNIT/ML Solostar Pen INJECT 15 UNITS INTO THE SKIN DAILY 08/03/24   Geofm Glade PARAS, MD  loratadine  (CLARITIN ) 10 MG tablet Take 10 mg by mouth at bedtime.    [provider]  losartan  (COZAAR ) 25 MG tablet TAKE A HALF TABLET BY MOUTH DAILY 04/06/24   Geofm Glade PARAS, MD  Olopatadine  HCl 0.2 % SOLN Place 1 drop in each eye once a day as needed for itchy watery eyes 01/23/23   Cheryl Reusing, FNP  OXYGEN  2lpm with sleep and 3lpm with exertion    [provider]  pantoprazole  (PROTONIX ) 40 MG tablet TAKE 1 TABLET BY MOUTH TWICE A DAY BEFORE MEALS 12/19/22   Geofm Glade PARAS, MD  predniSONE  (DELTASONE ) 5 MG tablet TAKE 1 TABLET BY MOUTH DAILY 12/12/23   Darlean Ozell NOVAK, MD  Semaglutide , 2 MG/DOSE, (OZEMPIC , 2 MG/DOSE,) 8 MG/3ML SOPN DIAL AND INJECT UNDER THE SKIN 2 MG WEEKLY 06/22/24   Geofm Glade PARAS, MD  Turmeric 500 MG CAPS Take 500 mg by mouth daily.    [provider]  venlafaxine  XR (EFFEXOR  XR) 75 MG 24 hr capsule Take 1 capsule (75 mg total) by mouth daily with breakfast. 06/05/24   Burns, Glade PARAS, MD  VITRON-C 65-125 MG TABS Take 1 tablet by mouth daily.    [provider]    Allergies: Penicillins, Metformin  and related, Other, Peach flavoring agent (non-screening), Levaquin  [levofloxacin ], and Mounjaro  [tirzepatide ]    Review of Systems  Gastrointestinal:  Positive for abdominal pain.    Updated Vital Signs BP (!) 142/83   Pulse (!) 119    Temp 97.7 F (36.5 C)   Resp (!) 26   SpO2 98%   Physical Exam Vitals and nursing note reviewed.  Constitutional:      General: She is not in acute distress.    Appearance: She is well-developed.  HENT:     Head: Normocephalic and atraumatic.     Mouth/Throat:     Pharynx: Oropharynx is clear.     Comments: Dry mucous membrane Eyes:     Conjunctiva/sclera: Conjunctivae normal.  Cardiovascular:     Rate and Rhythm: Normal rate and regular rhythm.     Heart sounds: Normal heart sounds. No murmur heard.    No gallop.  Pulmonary:     Effort: Pulmonary effort is normal. No respiratory  distress.     Breath sounds: Normal breath sounds. No stridor. No wheezing, rhonchi or rales.  Abdominal:     General: Abdomen is flat.     Palpations: Abdomen is soft.     Tenderness: There is no abdominal tenderness. There is no right CVA tenderness, left CVA tenderness, guarding or rebound. Negative signs include Murphy's sign and McBurney's sign.  Musculoskeletal:        General: No swelling.     Cervical back: Neck supple.  Skin:    General: Skin is warm and dry.     Capillary Refill: Capillary refill takes 2 to 3 seconds.  Neurological:     Mental Status: She is alert and oriented to person, place, and time.  Psychiatric:        Mood and Affect: Mood normal.     (all labs ordered are listed, but only abnormal results are displayed) Labs Reviewed  CBC - Abnormal; Notable for the following components:      Result Value   WBC 12.5 (*)    RBC 5.14 (*)    Hemoglobin 15.1 (*)    HCT 47.9 (*)    RDW 16.0 (*)    All other components within normal limits  LIPASE, BLOOD  COMPREHENSIVE METABOLIC PANEL WITH GFR  URINALYSIS, ROUTINE W REFLEX MICROSCOPIC    EKG: None  Radiology: No results found.  {Document cardiac monitor, telemetry assessment procedure when appropriate:32947} Procedures   Medications Ordered in the ED - No data to display    {Click here for ABCD2, HEART and other  calculators REFRESH Note before signing:1}                              Medical Decision Making Amount and/or Complexity of Data Reviewed Labs: ordered.  Risk Prescription drug management.   ***  {Document critical care time when appropriate  Document review of labs and clinical decision tools ie CHADS2VASC2, etc  Document your independent review of radiology images and any outside records  Document your discussion with family members, caretakers and with consultants  Document social determinants of health affecting pt's care  Document your decision making why or why not admission, treatments were needed:32947:::1}   Final diagnoses:  None    ED Discharge Orders     None

## 2024-09-17 ENCOUNTER — Encounter: Payer: Self-pay | Admitting: Internal Medicine

## 2024-09-17 NOTE — Progress Notes (Unsigned)
 Subjective:    Patient ID: [name], female    DOB: 1971-03-31, 53 y.o.   MRN: [id number]     HPI [name] is here for follow up of her chronic medical problems.  For DM - taking ozempic  2 mg, lantus  15 units, jardiance  25   Medications and allergies reviewed with patient and updated if appropriate.  Medications Ordered Prior to Encounter[1]   Review of Systems     Objective:  There were no vitals filed for this visit. BP Readings from Last 3 Encounters:  09/05/24 136/77  06/05/24 128/80  04/16/24 120/78   Wt Readings from Last 3 Encounters:  06/05/24 239 lb (108.4 kg)  03/30/24 238 lb (108 kg)  03/20/24 239 lb (108.4 kg)   There is no height or weight on file to calculate BMI.    Physical Exam     Lab Results  Component Value Date   WBC 12.5 (H) 09/05/2024   HGB 15.1 (H) 09/05/2024   HCT 47.9 (H) 09/05/2024   PLT 209 09/05/2024   GLUCOSE 162 (H) 09/05/2024   CHOL 160 06/05/2024   TRIG 73.0 06/05/2024   HDL 52.80 06/05/2024   LDLDIRECT 153.1 12/08/2012   LDLCALC 93 06/05/2024   ALT 24 09/05/2024   AST 28 09/05/2024   NA 142 09/05/2024   K 4.3 09/05/2024   CL 104 09/05/2024   CREATININE 0.84 09/05/2024   BUN 22 (H) 09/05/2024   CO2 24 09/05/2024   TSH 1.19 06/07/2022   INR 1.01 01/30/2017   HGBA1C 9.6 (H) 06/05/2024   MICROALBUR <0.7 03/30/2024     Assessment & Plan:    See Problem List for Assessment and Plan of chronic medical problems.       [1]  Current Outpatient Medications on File Prior to Visit  Medication Sig Dispense Refill   Accu-Chek Softclix Lancets lancets Use to check blood sugars twice a day 100 each 12   albuterol  (PROVENTIL ) (2.5 MG/3ML) 0.083% nebulizer solution Take 3 mLs (2.5 mg total) by nebulization every 6 (six) hours as needed for wheezing or shortness of breath. 150 mL 2   albuterol  (VENTOLIN  HFA) 108 (90 Base) MCG/ACT inhaler Inhale 2 puffs into the lungs every 4 (four) hours as needed for wheezing or  shortness of breath. 1 each 0   aspirin  EC 81 MG tablet Take 81 mg by mouth daily. Swallow whole.     atorvastatin  (LIPITOR) 20 MG tablet TAKE 1 TABLET BY MOUTH DAILY *NEED APPOINTMENT TO MAKE FURTHER REFILLS* 90 tablet 0   benzonatate  (TESSALON ) 100 MG capsule Take 1 capsule (100 mg total) by mouth 3 (three) times daily as needed for cough. 30 capsule 0   Blood Glucose Monitoring Suppl (ACCU-CHEK AVIVA PLUS) w/Device KIT Use to check blood sugars twice a day 1 kit 0   budesonide -glycopyrrolate-formoterol  (BREZTRI  AEROSPHERE) 160-9-4.8 MCG/ACT AERO inhaler Inhale 2 puffs into the lungs in the morning and at bedtime. 10.7 g 12   calcium -vitamin D (OSCAL WITH D) 500-200 MG-UNIT tablet Take 1 tablet by mouth daily.     celecoxib  (CELEBREX ) 200 MG capsule TAKE ONE CAPSULE BY MOUTH TWICE A DAY 60 capsule 5   Cholecalciferol (VITAMIN D3) 50 MCG (2000 UT) capsule Take 1 capsule (2,000 Units total) by mouth daily. 100 capsule 3   clobetasol  ointment (TEMOVATE ) 0.05 % Apply 1 Application topically 2 (two) times daily. Use for 3-5 days max per time. 30 g 5   Continuous Glucose Receiver (DEXCOM G7  RECEIVER) DEVI UAD to monitor sugars, IDDM, E11.9 1 each 0   Continuous Glucose Sensor (DEXCOM G7 SENSOR) MISC UAD to monitor sugars, IDDM  E11.9 9 each 3   Dextromethorphan -guaiFENesin  (MUCINEX  DM MAXIMUM STRENGTH) 60-1200 MG TB12 Take 1 tablet by mouth every 12 (twelve) hours as needed (with flutter valve).     ELDERBERRY PO Take 1,000 mg by mouth daily.     fluticasone  (FLONASE ) 50 MCG/ACT nasal spray INSTILL 1 TO 2 SPRAYS INTO BOTH NOSTRILS AS NEEDED FOR STUFFY NOSE **IN THE RIGHT NOSTRIL, POINT APPLICATOR OUT TOWARD THE RIGHT EAR AND IN THE LEFT NOSTRIL, POINT APPLICATIOR TOWARD THE LEFT EAR** 16 mL 5   glucose blood (ACCU-CHEK AVIVA PLUS) test strip USE TO CHECK FOR BLOOD SUGAR TWO TIMES A DAY 100 strip 0   glucose blood (ACCU-CHEK GUIDE TEST) test strip Use as instructed 200 each 12   Insulin  Pen Needle  (TECHLITE PEN NEEDLES) 32G X 6 MM MISC USE AS DIRECTED 100 each 2   JARDIANCE  25 MG TABS tablet TAKE 1 TABLET BY MOUTH EVERY MORNING WITH OR WITHOUT FOOD 90 tablet 0   LANTUS  SOLOSTAR 100 UNIT/ML Solostar Pen INJECT 15 UNITS INTO THE SKIN DAILY 15 mL 3   loratadine  (CLARITIN ) 10 MG tablet Take 10 mg by mouth at bedtime.     losartan  (COZAAR ) 25 MG tablet TAKE A HALF TABLET BY MOUTH DAILY 45 tablet 1   Olopatadine  HCl 0.2 % SOLN Place 1 drop in each eye once a day as needed for itchy watery eyes 2.5 mL 5   ondansetron  (ZOFRAN ) 4 MG tablet Take 1 tablet (4 mg total) by mouth every 6 (six) hours. 12 tablet 0   OXYGEN  2lpm with sleep and 3lpm with exertion     pantoprazole  (PROTONIX ) 40 MG tablet TAKE 1 TABLET BY MOUTH TWICE A DAY BEFORE MEALS 180 tablet 0   predniSONE  (DELTASONE ) 5 MG tablet TAKE 1 TABLET BY MOUTH DAILY 90 tablet 2   Semaglutide , 2 MG/DOSE, (OZEMPIC , 2 MG/DOSE,) 8 MG/3ML SOPN DIAL AND INJECT UNDER THE SKIN 2 MG WEEKLY 9 mL 0   Turmeric 500 MG CAPS Take 500 mg by mouth daily.     venlafaxine  XR (EFFEXOR  XR) 75 MG 24 hr capsule Take 1 capsule (75 mg total) by mouth daily with breakfast. 90 capsule 1   VITRON-C 65-125 MG TABS Take 1 tablet by mouth daily.     No current facility-administered medications on file prior to visit.

## 2024-09-18 ENCOUNTER — Ambulatory Visit (INDEPENDENT_AMBULATORY_CARE_PROVIDER_SITE_OTHER): Admitting: Internal Medicine

## 2024-09-18 VITALS — BP 118/78 | HR 95 | Temp 97.7°F | Ht 61.0 in | Wt 233.0 lb

## 2024-09-18 DIAGNOSIS — E669 Obesity, unspecified: Secondary | ICD-10-CM

## 2024-09-18 DIAGNOSIS — I152 Hypertension secondary to endocrine disorders: Secondary | ICD-10-CM | POA: Diagnosis not present

## 2024-09-18 DIAGNOSIS — Z6841 Body Mass Index (BMI) 40.0 and over, adult: Secondary | ICD-10-CM

## 2024-09-18 DIAGNOSIS — E1169 Type 2 diabetes mellitus with other specified complication: Secondary | ICD-10-CM | POA: Diagnosis not present

## 2024-09-18 DIAGNOSIS — E785 Hyperlipidemia, unspecified: Secondary | ICD-10-CM | POA: Diagnosis not present

## 2024-09-18 DIAGNOSIS — E1159 Type 2 diabetes mellitus with other circulatory complications: Secondary | ICD-10-CM | POA: Diagnosis not present

## 2024-09-18 DIAGNOSIS — F419 Anxiety disorder, unspecified: Secondary | ICD-10-CM

## 2024-09-18 LAB — POCT GLYCOSYLATED HEMOGLOBIN (HGB A1C)
HbA1c POC (<> result, manual entry): 8.3 %
HbA1c, POC (controlled diabetic range): 8.3 % — AB (ref 0.0–7.0)
HbA1c, POC (prediabetic range): 8.3 % — AB (ref 5.7–6.4)
Hemoglobin A1C: 8.3 % — AB (ref 4.0–5.6)

## 2024-09-18 NOTE — Assessment & Plan Note (Signed)
 Chronic Blood pressure well controlled Continue losartan  12.5 mg daily

## 2024-09-18 NOTE — Patient Instructions (Addendum)
" ° ° ° ° °  Medications changes include :   None     Return in about 3 months (around 12/17/2024) for follow up.  "

## 2024-09-18 NOTE — Assessment & Plan Note (Signed)
 Chronic Controlled  Seeing a therapist Continue Effexor  to 75  mg daily

## 2024-09-18 NOTE — Assessment & Plan Note (Addendum)
 Chronic Lab Results  Component Value Date   LDLCALC 93 06/05/2024   Regular exercise and healthy diet encouraged Continue atorvastatin  20 mg daily

## 2024-09-18 NOTE — Assessment & Plan Note (Addendum)
 Chronic Stressed weight loss-she is working on it and has lost some weight On Ozempic  2 mg weekly Discussed regular exercise-encouraged her to be more active Diabetic diet, smaller portions, which she has been doing

## 2024-09-18 NOTE — Assessment & Plan Note (Addendum)
 Chronic Associated with hyperglycemia, obesity, hypertension, hyperlipidemia  Lab Results  Component Value Date   HGBA1C 8.3 (A) 09/18/2024   HGBA1C 8.3 09/18/2024   HGBA1C 8.3 (A) 09/18/2024   HGBA1C 8.3 (A) 09/18/2024   Sugars not ideally controlled but improved-A1c today 8.3 her CGM average glucose 176 and estimated A1c for the past 90 days 7.5% Intermittently on prednisone , Continue Jardiance  25 mg daily, Ozempic  to 2 mg weekly Has not been taking Lantus  regularly-has only been taking it when she is on prednisone  Start Lantus  15 units daily-Will adjust based on sugars Continue weight loss efforts Continue to improve diet-decrease sugars, fast food, increased activity

## 2024-09-20 ENCOUNTER — Other Ambulatory Visit: Payer: Self-pay | Admitting: Internal Medicine

## 2024-09-29 ENCOUNTER — Other Ambulatory Visit: Payer: Self-pay | Admitting: Internal Medicine

## 2024-09-30 ENCOUNTER — Other Ambulatory Visit: Payer: Self-pay | Admitting: Internal Medicine

## 2024-10-04 ENCOUNTER — Other Ambulatory Visit: Payer: Self-pay | Admitting: Internal Medicine

## 2024-10-05 ENCOUNTER — Encounter: Payer: Self-pay | Admitting: Allergy & Immunology

## 2024-10-06 ENCOUNTER — Telehealth: Payer: Self-pay | Admitting: Allergy & Immunology

## 2024-10-06 NOTE — Telephone Encounter (Signed)
 [name] called and stated that she is in need of Prednisone  5 mg. She has up coming appointment but needs this medication now. She states that Dr. Darlean cannot prescribe as she is now primarily Dr. Sherleen patient. She uses the [facility] on [address].

## 2024-10-07 MED ORDER — PREDNISONE 5 MG PO TABS: 5.0000 mg | ORAL_TABLET | Freq: Every day | ORAL | 1 refills | Status: DC

## 2024-10-09 NOTE — Telephone Encounter (Addendum)
 I think I refilled this already.  Marty Shaggy, MD [facility] 

## 2024-10-14 ENCOUNTER — Other Ambulatory Visit: Payer: Self-pay | Admitting: Internal Medicine

## 2024-10-15 ENCOUNTER — Other Ambulatory Visit: Payer: Self-pay

## 2024-10-15 ENCOUNTER — Encounter: Payer: Self-pay | Admitting: Allergy & Immunology

## 2024-10-15 ENCOUNTER — Ambulatory Visit: Admitting: Allergy & Immunology

## 2024-10-15 VITALS — BP 118/86 | HR 79 | Temp 97.5°F | Resp 16 | Ht 60.0 in | Wt 234.5 lb

## 2024-10-15 DIAGNOSIS — J84112 Idiopathic pulmonary fibrosis: Secondary | ICD-10-CM | POA: Diagnosis not present

## 2024-10-15 DIAGNOSIS — W57XXXD Bitten or stung by nonvenomous insect and other nonvenomous arthropods, subsequent encounter: Secondary | ICD-10-CM

## 2024-10-15 DIAGNOSIS — J302 Other seasonal allergic rhinitis: Secondary | ICD-10-CM | POA: Diagnosis not present

## 2024-10-15 DIAGNOSIS — Z7952 Long term (current) use of systemic steroids: Secondary | ICD-10-CM | POA: Diagnosis not present

## 2024-10-15 DIAGNOSIS — J3089 Other allergic rhinitis: Secondary | ICD-10-CM

## 2024-10-15 MED ORDER — PREDNISONE 5 MG PO TABS: 5.0000 mg | ORAL_TABLET | Freq: Every day | ORAL | 1 refills | Status: AC

## 2024-10-15 MED ORDER — BREZTRI AEROSPHERE 160-9-4.8 MCG/ACT IN AERO: 2.0000 | INHALATION_SPRAY | Freq: Two times a day (BID) | RESPIRATORY_TRACT | 12 refills | Status: AC

## 2024-10-15 NOTE — Progress Notes (Signed)
 "  FOLLOW UP  Date of Service/Encounter:  10/15/24   Assessment:   Idiopathic pulmonary fibrosis - with improved lung function today (maintained on prednisone  5 mg daily)   Doing better clinically on Breztri , therefore likely some component of asthma   54 seasonal allergic rhinitis (dust mites, grasses, ragweed, weeds, trees, and indoor and outdoor molds) - [employer]interest GERD - on PPO   On disability  Plan/Recommendations:   1. Idiopathic pulmonary fibrosis - Lung testing looked stable today, maybe a little bit better. - We are not going to make any medication changes.  - I will send my note to Dr. Darlean to keep him in the loop. - I am very reassured that you are going to see him annually.  - Daily controller medication(s): Breztri  two puffs twice daily + prednisone  5 mg daily - Prior to physical activity: albuterol  2 puffs 10-15 minutes before physical activity. - Rescue medications: albuterol  4 puffs every 4-6 hours as needed - Asthma control goals:  * Full participation in all desired activities (may need albuterol  before activity) * Albuterol  use two time or less a week on average (not counting use with activity) * Cough interfering with sleep two time or less a month * Oral steroids no more than once a year * No hospitalizations  2.  Seasonal and perennial rhinitis/allergic conjunctivitis (dust mites, grasses, ragweed, weeds, trees, and indoor and outdoor molds) - Continue with cetirizine 10mg  daily.  - Let us  know when you are interested in starting allergy  shots.  - Consent was already signed at the last visit.   - This would help with the postnasal drip and the breathing issues associated with environmental allergies.   3. 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 - Use for 3 - 5 days maximum to see if this can limit the  inflammation.   4. Osteopenia - I ordered another bone scan to follow up with your osteopenia. - Chronic prednisone  use can make this a lot worse.  - We are ordering imaging for you at a [facility] facility. - We will check to see if a PA is needed, but go ahead and call to schedule the imaging study. - The phone number for [facility] Scheduling is [phone] (option #3).   5. Return in about 3 months (around 01/13/2025). You can have the follow up appointment with Dr. Iva or a Nurse Practicioner (our Nurse Practitioners are excellent and always have Physician oversight!).   Subjective:   [name] is a 54 y.o. female presenting today for follow up of  Chief Complaint  Patient presents with   Follow-up    She says her breathing is doing fine    [name] has a history of the following: Patient Active Problem List   Diagnosis Date Noted   Temporomandibular joint (TMJ) pain 11/28/2022   Urinary frequency 06/28/2021   Change in stool 06/28/2021   Adenomatous polyp of transverse colon    On home oxygen  therapy 03/27/2021   Genital herpes 04/11/2020   History of nephrolithiasis 10/13/2019   Anxiety 02/05/2018   Chronic respiratory failure with hypoxia (HCC) 07/20/2017   Knee pain, left 02/02/2016   Cough variant asthma 03/09/2015   Type 2 diabetes mellitus in patient with obesity (HCC) 12/09/2012   Hyperlipidemia associated with type 2  diabetes mellitus (HCC)    Allergic rhinitis 01/05/2010   Hypertension associated with type 2 diabetes mellitus (HCC) 09/07/2009   URINARY INCONTINENCE, URGE 03/24/2009   CHRONIC RHINITIS 12/23/2008   Morbid obesity due to excess calories (HCC) 10/10/2007   INTERSTITIAL LUNG DISEASE 10/10/2007   GASTROESOPHAGEAL REFLUX DISEASE 10/10/2007    History obtained from: chart review and patient.  Discussed the use of AI scribe software for clinical note transcription with the patient and/or guardian, who gave verbal consent to  proceed.  [name] is a 54 y.o. female presenting for a follow up visit.  She was last seen in July 2025.  At that time, like testing was stable.  We continue with Breztri  2 puffs twice daily as well as prednisone .  For her rhinitis, we will continue with cetirizine and she decided to start allergy  shots for long-term control.  We continued with clobetasol  as needed for allergic reaction insects.  Since last visit, she has done very well.  Asthma/Respiratory Symptom History: She has been on prednisone  since 2009, with symptoms worsening on the second day without it. She receives a 90-day supply with one refill. She continues to see Pulmonology annually (Dr. Darlean). She is also on Breztri , which she receives for free through a mail-order pharmacy. She experiences seasonal challenges with her respiratory condition, particularly with changes in weather, and manages these symptoms with her current medication regimen, seeking medical attention only when unable to control symptoms on her own.  Allergic Rhinitis Symptom History: She experiences ear itching and congestion recently, which she self-managed without visiting a doctor. No recent sinus or ear infections requiring medical attention.   She has a history of fibromyalgia, which affects her response to injections, as some days the pain is more intense. She is considering starting allergy  shots but has not yet committed to the regimen.  She has a history of osteopenia, for which she takes calcium  supplements daily. She has not had a recent bone scan but acknowledges the need for monitoring due to her chronic prednisone  use.  Otherwise, there have been no changes to her past medical history, surgical history, family history, or social history.    Review of systems otherwise negative other than that mentioned in the HPI.    Objective:   Blood pressure 118/86, pulse 79, temperature (!) 97.5 F (36.4 C), temperature source Temporal, resp. rate 16,  height 5' (1.524 m), weight 234 lb 8 oz (106.4 kg), SpO2 98%. Body mass index is 45.8 kg/m.    Physical Exam Vitals reviewed.  Constitutional:      Appearance: She is well-developed.     Comments: Very lovely. Smiling. Personable.   HENT:     Head: Normocephalic and atraumatic.     Right Ear: Tympanic membrane, ear canal and external ear normal. No drainage, swelling or tenderness. Tympanic membrane is not injected, scarred, erythematous, retracted or bulging.     Left Ear: Tympanic membrane, ear canal and external ear normal. No drainage, swelling or tenderness. Tympanic membrane is not injected, scarred, erythematous, retracted or bulging.     Nose: No nasal deformity, septal deviation, mucosal edema or rhinorrhea.     Right Turbinates: Enlarged, swollen and pale.     Left Turbinates: Enlarged, swollen and pale.     Right Sinus: No maxillary sinus tenderness or frontal sinus tenderness.     Left Sinus: No maxillary sinus tenderness or frontal sinus tenderness.     Comments: No polpys noted.     Mouth/Throat:  Lips: Pink.     Mouth: Mucous membranes are moist. Mucous membranes are not pale and not dry.     Pharynx: Oropharynx is clear. Uvula midline.  Eyes:     General:        Right eye: No discharge.        Left eye: No discharge.     Conjunctiva/sclera: Conjunctivae normal.     Right eye: Right conjunctiva is not injected. No chemosis.    Left eye: Left conjunctiva is not injected. No chemosis.    Pupils: Pupils are equal, round, and reactive to light.  Cardiovascular:     Rate and Rhythm: Normal rate and regular rhythm.     Heart sounds: Normal heart sounds.  Pulmonary:     Effort: Pulmonary effort is normal. No tachypnea, accessory muscle usage or respiratory distress.     Breath sounds: Examination of the right-lower field reveals wheezing. Examination of the left-lower field reveals wheezing. Wheezing present. No rhonchi or rales.     Comments: Crackles at the  bilateral bases. Moving air better today than previous visits.  This appears to be her baseline.  Chest:     Chest wall: No tenderness.  Abdominal:     Tenderness: There is no abdominal tenderness. There is no guarding or rebound.  Lymphadenopathy:     Head:     Right side of head: No submandibular, tonsillar or occipital adenopathy.     Left side of head: No submandibular, tonsillar or occipital adenopathy.     Cervical: No cervical adenopathy.  Skin:    General: Skin is warm.     Capillary Refill: Capillary refill takes less than 2 seconds.     Coloration: Skin is not pale.     Findings: No abrasion, erythema, petechiae or rash. Rash is not papular, urticarial or vesicular.  Neurological:     Mental Status: She is alert.  Psychiatric:        Behavior: Behavior is cooperative.      Diagnostic studies:    Spirometry: results abnormal (FEV1: 1.06/50%, FVC: 1.45/56%, FEV1/FVC: 73%).    Spirometry consistent with possible restrictive disease.   Allergy  Studies: none     Marty Shaggy, MD  [facility]        "

## 2024-10-15 NOTE — Patient Instructions (Addendum)
 1. Idiopathic pulmonary fibrosis - Lung testing looked stable today, maybe a little bit better. - We are not going to make any medication changes.  - I will send my note to Dr. Darlean to keep him in the loop. - I am very reassured that you are going to see him annually.  - Daily controller medication(s): Breztri  two puffs twice daily + prednisone  5 mg daily - Prior to physical activity: albuterol  2 puffs 10-15 minutes before physical activity. - Rescue medications: albuterol  4 puffs every 4-6 hours as needed - Asthma control goals:  * Full participation in all desired activities (may need albuterol  before activity) * Albuterol  use two time or less a week on average (not counting use with activity) * Cough interfering with sleep two time or less a month * Oral steroids no more than once a year * No hospitalizations  2.  Seasonal and perennial rhinitis/allergic conjunctivitis (dust mites, grasses, ragweed, weeds, trees, and indoor and outdoor molds) - Continue with cetirizine 10mg  daily.  - Let us  know when you are interested in starting allergy  shots.  - Consent was already signed at the last visit.   - This would help with the postnasal drip and the breathing issues associated with environmental allergies.   3. 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 - Use for 3 - 5 days maximum to see if this can limit the inflammation.   4. Osteopenia - I ordered another bone scan to follow up with your osteopenia. - Chronic prednisone  use can make this a lot worse.  - We are ordering imaging for you at a [facility] facility. - We will check to see if a PA is needed, but go ahead and call to schedule the imaging study. - The phone number for [facility] Scheduling is [phone] (option #3).   5. Return in about 3 months (around 01/13/2025). You can have  the follow up appointment with Dr. Iva or a Nurse Practicioner (our Nurse Practitioners are excellent and always have Physician oversight!).    Please inform us  of any Emergency Department visits, hospitalizations, or changes in symptoms. Call us  before going to the ED for breathing or allergy  symptoms since we might be able to fit you in for a sick visit. Feel free to contact us  anytime with any questions, problems, or concerns.  It was a pleasure to see you again today!  Websites that have reliable patient information: 1. American Academy of Asthma, Allergy , and Immunology: www.aaaai.org 2. [employer] (FARE): foodallergy.org 3. Mothers of Asthmatics: http://www.asthmacommunitynetwork.org 4. American College of Allergy , Asthma, and Immunology: www.acaai.org      Like us  on [employer] and Instagram for our latest updates!      A healthy democracy works best when [employer] participate! Make sure you are registered to vote! If you have moved or changed any of your contact information, you will need to get this updated before voting! Scan the QR codes below to learn more!

## 2024-10-20 ENCOUNTER — Encounter: Payer: Self-pay | Admitting: Allergy & Immunology

## 2024-10-20 NOTE — Addendum Note (Signed)
 Addended by: IVA MARTY SALTNESS on: 10/20/2024 12:57 PM   Modules accepted: Orders

## 2024-10-22 ENCOUNTER — Other Ambulatory Visit: Payer: Self-pay | Admitting: Internal Medicine

## 2024-11-04 ENCOUNTER — Other Ambulatory Visit: Payer: Self-pay | Admitting: Internal Medicine

## 2024-12-17 ENCOUNTER — Ambulatory Visit: Admitting: Internal Medicine

## 2025-01-19 ENCOUNTER — Ambulatory Visit: Admitting: Allergy & Immunology

## 2025-03-23 ENCOUNTER — Ambulatory Visit
